# Patient Record
Sex: Female | Born: 1950 | Race: Black or African American | Hispanic: No | Marital: Single | Smoking: Never smoker
Health system: Southern US, Community
[De-identification: ages and names within clinical notes are randomized; demographics above are authoritative.]

## PROBLEM LIST (undated history)

## (undated) DIAGNOSIS — M5136 Other intervertebral disc degeneration, lumbar region: Secondary | ICD-10-CM

## (undated) DIAGNOSIS — IMO0002 Reserved for concepts with insufficient information to code with codable children: Secondary | ICD-10-CM

## (undated) DIAGNOSIS — R0600 Dyspnea, unspecified: Secondary | ICD-10-CM

## (undated) DIAGNOSIS — F419 Anxiety disorder, unspecified: Secondary | ICD-10-CM

## (undated) DIAGNOSIS — F418 Other specified anxiety disorders: Secondary | ICD-10-CM

## (undated) DIAGNOSIS — R079 Chest pain, unspecified: Secondary | ICD-10-CM

## (undated) DIAGNOSIS — E119 Type 2 diabetes mellitus without complications: Secondary | ICD-10-CM

## (undated) DIAGNOSIS — F329 Major depressive disorder, single episode, unspecified: Secondary | ICD-10-CM

## (undated) DIAGNOSIS — J302 Other seasonal allergic rhinitis: Secondary | ICD-10-CM

## (undated) DIAGNOSIS — N281 Cyst of kidney, acquired: Secondary | ICD-10-CM

## (undated) DIAGNOSIS — M1990 Unspecified osteoarthritis, unspecified site: Secondary | ICD-10-CM

## (undated) DIAGNOSIS — R112 Nausea with vomiting, unspecified: Secondary | ICD-10-CM

## (undated) DIAGNOSIS — I10 Essential (primary) hypertension: Secondary | ICD-10-CM

## (undated) DIAGNOSIS — T7840XA Allergy, unspecified, initial encounter: Secondary | ICD-10-CM

## (undated) DIAGNOSIS — F32A Depression, unspecified: Secondary | ICD-10-CM

## (undated) DIAGNOSIS — E785 Hyperlipidemia, unspecified: Secondary | ICD-10-CM

## (undated) DIAGNOSIS — H409 Unspecified glaucoma: Secondary | ICD-10-CM

## (undated) DIAGNOSIS — Z9889 Other specified postprocedural states: Secondary | ICD-10-CM

## (undated) DIAGNOSIS — Z87898 Personal history of other specified conditions: Secondary | ICD-10-CM

## (undated) HISTORY — DX: Other specified postprocedural states: Z98.890

## (undated) HISTORY — PX: OTHER SURGICAL HISTORY: SHX169

## (undated) HISTORY — DX: Anxiety disorder, unspecified: F41.9

## (undated) HISTORY — DX: Unspecified osteoarthritis, unspecified site: M19.90

## (undated) HISTORY — DX: Personal history of other specified conditions: Z87.898

## (undated) HISTORY — PX: GLAUCOMA SURGERY: SHX656

## (undated) HISTORY — DX: Other intervertebral disc degeneration, lumbar region: M51.36

## (undated) HISTORY — DX: Depression, unspecified: F32.A

## (undated) HISTORY — DX: Chest pain, unspecified: R07.9

## (undated) HISTORY — DX: Other specified postprocedural states: R11.2

## (undated) HISTORY — DX: Reserved for concepts with insufficient information to code with codable children: IMO0002

## (undated) HISTORY — PX: TONSILLECTOMY: SUR1361

## (undated) HISTORY — DX: Unspecified glaucoma: H40.9

## (undated) HISTORY — DX: Other specified anxiety disorders: F41.8

## (undated) HISTORY — DX: Hyperlipidemia, unspecified: E78.5

## (undated) HISTORY — DX: Dyspnea, unspecified: R06.00

## (undated) HISTORY — DX: Type 2 diabetes mellitus without complications: E11.9

## (undated) HISTORY — DX: Cyst of kidney, acquired: N28.1

## (undated) HISTORY — PX: HAMMER TOE SURGERY: SHX385

## (undated) HISTORY — PX: CATARACT EXTRACTION W/ INTRAOCULAR LENS IMPLANT: SHX1309

## (undated) HISTORY — DX: Major depressive disorder, single episode, unspecified: F32.9

## (undated) HISTORY — DX: Allergy, unspecified, initial encounter: T78.40XA

---

## 1998-02-23 ENCOUNTER — Ambulatory Visit (HOSPITAL_BASED_OUTPATIENT_CLINIC_OR_DEPARTMENT_OTHER): Admission: RE | Admit: 1998-02-23 | Discharge: 1998-02-23 | Payer: Self-pay | Admitting: Orthopedic Surgery

## 1998-04-01 ENCOUNTER — Emergency Department (HOSPITAL_COMMUNITY): Admission: EM | Admit: 1998-04-01 | Discharge: 1998-04-01 | Payer: Self-pay | Admitting: Emergency Medicine

## 1998-08-17 ENCOUNTER — Encounter: Admission: RE | Admit: 1998-08-17 | Discharge: 1998-08-17 | Payer: Self-pay | Admitting: Obstetrics

## 1998-08-18 ENCOUNTER — Ambulatory Visit (HOSPITAL_COMMUNITY): Admission: RE | Admit: 1998-08-18 | Discharge: 1998-08-18 | Payer: Self-pay | Admitting: Obstetrics

## 1998-08-18 ENCOUNTER — Encounter: Payer: Self-pay | Admitting: Obstetrics

## 1998-08-23 ENCOUNTER — Ambulatory Visit (HOSPITAL_COMMUNITY): Admission: RE | Admit: 1998-08-23 | Discharge: 1998-08-23 | Payer: Self-pay

## 1998-10-19 ENCOUNTER — Encounter: Admission: RE | Admit: 1998-10-19 | Discharge: 1998-10-19 | Payer: Self-pay | Admitting: Obstetrics & Gynecology

## 1998-12-29 ENCOUNTER — Encounter: Admission: RE | Admit: 1998-12-29 | Discharge: 1998-12-29 | Payer: Self-pay | Admitting: Obstetrics

## 1999-05-05 ENCOUNTER — Encounter: Admission: RE | Admit: 1999-05-05 | Discharge: 1999-05-05 | Payer: Self-pay | Admitting: Obstetrics

## 1999-07-22 ENCOUNTER — Emergency Department (HOSPITAL_COMMUNITY): Admission: EM | Admit: 1999-07-22 | Discharge: 1999-07-22 | Payer: Self-pay | Admitting: Emergency Medicine

## 1999-07-23 ENCOUNTER — Encounter: Payer: Self-pay | Admitting: Emergency Medicine

## 1999-09-19 ENCOUNTER — Ambulatory Visit (HOSPITAL_COMMUNITY): Admission: RE | Admit: 1999-09-19 | Discharge: 1999-09-19 | Payer: Self-pay

## 1999-11-29 ENCOUNTER — Encounter: Admission: RE | Admit: 1999-11-29 | Discharge: 1999-11-29 | Payer: Self-pay | Admitting: Obstetrics & Gynecology

## 2000-09-26 ENCOUNTER — Ambulatory Visit (HOSPITAL_COMMUNITY): Admission: RE | Admit: 2000-09-26 | Discharge: 2000-09-26 | Payer: Self-pay | Admitting: Obstetrics

## 2000-10-04 ENCOUNTER — Encounter: Admission: RE | Admit: 2000-10-04 | Discharge: 2000-10-04 | Payer: Self-pay | Admitting: Obstetrics

## 2000-12-20 ENCOUNTER — Encounter: Admission: RE | Admit: 2000-12-20 | Discharge: 2000-12-20 | Payer: Self-pay | Admitting: Obstetrics

## 2001-02-21 ENCOUNTER — Encounter: Payer: Self-pay | Admitting: Emergency Medicine

## 2001-02-21 ENCOUNTER — Emergency Department (HOSPITAL_COMMUNITY): Admission: EM | Admit: 2001-02-21 | Discharge: 2001-02-21 | Payer: Self-pay | Admitting: Emergency Medicine

## 2001-02-23 ENCOUNTER — Emergency Department (HOSPITAL_COMMUNITY): Admission: EM | Admit: 2001-02-23 | Discharge: 2001-02-23 | Payer: Self-pay

## 2001-04-05 ENCOUNTER — Encounter: Admission: RE | Admit: 2001-04-05 | Discharge: 2001-04-05 | Payer: Self-pay | Admitting: Internal Medicine

## 2001-05-30 ENCOUNTER — Ambulatory Visit (HOSPITAL_COMMUNITY): Admission: RE | Admit: 2001-05-30 | Discharge: 2001-05-30 | Payer: Self-pay | Admitting: Diagnostic Radiology

## 2001-06-03 ENCOUNTER — Encounter: Payer: Self-pay | Admitting: Obstetrics and Gynecology

## 2001-06-03 ENCOUNTER — Ambulatory Visit (HOSPITAL_COMMUNITY): Admission: RE | Admit: 2001-06-03 | Discharge: 2001-06-03 | Payer: Self-pay | Admitting: Obstetrics and Gynecology

## 2001-06-17 ENCOUNTER — Observation Stay (HOSPITAL_COMMUNITY): Admission: RE | Admit: 2001-06-17 | Discharge: 2001-06-18 | Payer: Self-pay | Admitting: Diagnostic Radiology

## 2001-10-25 ENCOUNTER — Ambulatory Visit (HOSPITAL_COMMUNITY): Admission: RE | Admit: 2001-10-25 | Discharge: 2001-10-25 | Payer: Self-pay | Admitting: Obstetrics

## 2001-10-25 ENCOUNTER — Encounter: Payer: Self-pay | Admitting: Obstetrics

## 2001-12-31 ENCOUNTER — Emergency Department (HOSPITAL_COMMUNITY): Admission: EM | Admit: 2001-12-31 | Discharge: 2001-12-31 | Payer: Self-pay | Admitting: Emergency Medicine

## 2002-09-04 HISTORY — PX: TOTAL KNEE ARTHROPLASTY: SHX125

## 2002-12-03 ENCOUNTER — Encounter: Payer: Self-pay | Admitting: Orthopedic Surgery

## 2002-12-09 ENCOUNTER — Inpatient Hospital Stay (HOSPITAL_COMMUNITY): Admission: RE | Admit: 2002-12-09 | Discharge: 2002-12-12 | Payer: Self-pay | Admitting: Orthopedic Surgery

## 2003-05-05 ENCOUNTER — Encounter: Payer: Self-pay | Admitting: Obstetrics

## 2003-05-05 ENCOUNTER — Ambulatory Visit (HOSPITAL_COMMUNITY): Admission: RE | Admit: 2003-05-05 | Discharge: 2003-05-05 | Payer: Self-pay | Admitting: Obstetrics

## 2004-02-08 ENCOUNTER — Ambulatory Visit (HOSPITAL_COMMUNITY): Admission: RE | Admit: 2004-02-08 | Discharge: 2004-02-08 | Payer: Self-pay | Admitting: Orthopedic Surgery

## 2004-10-05 ENCOUNTER — Ambulatory Visit: Payer: Self-pay | Admitting: Gastroenterology

## 2004-10-06 ENCOUNTER — Ambulatory Visit (HOSPITAL_COMMUNITY): Admission: RE | Admit: 2004-10-06 | Discharge: 2004-10-06 | Payer: Self-pay | Admitting: Gastroenterology

## 2004-11-04 ENCOUNTER — Ambulatory Visit: Payer: Self-pay | Admitting: Gastroenterology

## 2004-11-08 ENCOUNTER — Ambulatory Visit: Payer: Self-pay | Admitting: Gastroenterology

## 2005-04-09 ENCOUNTER — Emergency Department (HOSPITAL_COMMUNITY): Admission: EM | Admit: 2005-04-09 | Discharge: 2005-04-09 | Payer: Self-pay | Admitting: Emergency Medicine

## 2005-05-26 ENCOUNTER — Encounter: Admission: RE | Admit: 2005-05-26 | Discharge: 2005-05-26 | Payer: Self-pay | Admitting: Obstetrics & Gynecology

## 2005-06-30 ENCOUNTER — Emergency Department (HOSPITAL_COMMUNITY): Admission: EM | Admit: 2005-06-30 | Discharge: 2005-06-30 | Payer: Self-pay | Admitting: Family Medicine

## 2005-09-04 HISTORY — PX: LAPAROSCOPIC SUPRACERVICAL HYSTERECTOMY: SUR797

## 2005-09-04 HISTORY — PX: ABDOMINAL HYSTERECTOMY: SHX81

## 2005-09-14 ENCOUNTER — Encounter (INDEPENDENT_AMBULATORY_CARE_PROVIDER_SITE_OTHER): Payer: Self-pay | Admitting: Specialist

## 2005-09-14 ENCOUNTER — Observation Stay (HOSPITAL_COMMUNITY): Admission: RE | Admit: 2005-09-14 | Discharge: 2005-09-15 | Payer: Self-pay | Admitting: *Deleted

## 2006-03-24 ENCOUNTER — Emergency Department (HOSPITAL_COMMUNITY): Admission: EM | Admit: 2006-03-24 | Discharge: 2006-03-24 | Payer: Self-pay | Admitting: Emergency Medicine

## 2006-09-13 ENCOUNTER — Ambulatory Visit (HOSPITAL_COMMUNITY)
Admission: RE | Admit: 2006-09-13 | Discharge: 2006-09-13 | Payer: Self-pay | Admitting: Physical Medicine and Rehabilitation

## 2008-06-28 ENCOUNTER — Emergency Department (HOSPITAL_COMMUNITY): Admission: EM | Admit: 2008-06-28 | Discharge: 2008-06-28 | Payer: Self-pay | Admitting: Emergency Medicine

## 2008-11-21 ENCOUNTER — Emergency Department (HOSPITAL_COMMUNITY): Admission: EM | Admit: 2008-11-21 | Discharge: 2008-11-21 | Payer: Self-pay | Admitting: Emergency Medicine

## 2008-11-28 ENCOUNTER — Emergency Department (HOSPITAL_COMMUNITY): Admission: EM | Admit: 2008-11-28 | Discharge: 2008-11-28 | Payer: Self-pay | Admitting: Family Medicine

## 2009-02-26 ENCOUNTER — Emergency Department (HOSPITAL_COMMUNITY): Admission: EM | Admit: 2009-02-26 | Discharge: 2009-02-26 | Payer: Self-pay | Admitting: Emergency Medicine

## 2009-05-30 ENCOUNTER — Emergency Department (HOSPITAL_COMMUNITY): Admission: EM | Admit: 2009-05-30 | Discharge: 2009-05-30 | Payer: Self-pay | Admitting: Emergency Medicine

## 2009-09-04 DIAGNOSIS — E119 Type 2 diabetes mellitus without complications: Secondary | ICD-10-CM

## 2009-09-04 HISTORY — DX: Type 2 diabetes mellitus without complications: E11.9

## 2009-10-29 ENCOUNTER — Encounter (INDEPENDENT_AMBULATORY_CARE_PROVIDER_SITE_OTHER): Payer: Self-pay | Admitting: *Deleted

## 2009-12-17 ENCOUNTER — Emergency Department (HOSPITAL_COMMUNITY): Admission: EM | Admit: 2009-12-17 | Discharge: 2009-12-17 | Payer: Self-pay | Admitting: Emergency Medicine

## 2010-03-19 ENCOUNTER — Emergency Department (HOSPITAL_COMMUNITY): Admission: EM | Admit: 2010-03-19 | Discharge: 2010-03-19 | Payer: Self-pay | Admitting: Family Medicine

## 2010-06-24 ENCOUNTER — Emergency Department (HOSPITAL_COMMUNITY): Admission: EM | Admit: 2010-06-24 | Discharge: 2010-06-24 | Payer: Self-pay | Admitting: Family Medicine

## 2010-07-04 ENCOUNTER — Emergency Department (HOSPITAL_COMMUNITY): Admission: EM | Admit: 2010-07-04 | Discharge: 2010-07-04 | Payer: Self-pay | Admitting: Emergency Medicine

## 2010-08-09 ENCOUNTER — Telehealth: Payer: Self-pay | Admitting: Gastroenterology

## 2010-09-25 ENCOUNTER — Encounter: Payer: Self-pay | Admitting: Physical Medicine and Rehabilitation

## 2010-10-04 NOTE — Progress Notes (Signed)
Summary: Schedule Recall Colonoscopy   Phone Note Outgoing Call Call back at Home Phone [phone]   Call placed by: Harlow Mares CMA Duncan Dull),  August 09, 2010 1:33 PM Call placed to: Patient Summary of Call: Patients number is disconnected, we will mail them a letter to remind them they are due for their procedure and they need to call back and schedule.   Initial call taken by: Harlow Mares CMA (AAMA),  August 09, 2010 1:33 PM

## 2010-10-04 NOTE — Letter (Signed)
Summary: Colonoscopy Letter  [facility]  [address] [place], [place] [address]   Phone: [phone]  Fax: [phone]      October 29, 2009 MRN: [id number]   Melissa [name] [address] [place], [place]  [address]   Dear Ms. DESANTIAGO,   According to your medical record, it is time for you to schedule a Colonoscopy. The American Cancer Society recommends this procedure as a method to detect early colon cancer. Patients with a family history of colon cancer, or a personal history of colon polyps or inflammatory bowel disease are at increased risk.  This letter has beeen generated based on the recommendations made at the time of your procedure. If you feel that in your particular situation this may no longer apply, please contact our office.  Please call our office at [phone] to schedule this appointment or to update your records at your earliest convenience.  Thank you for cooperating with [place] to provide you with the very best care possible.   Sincerely,  Judie Petit T. Russella Dar, M.D.  [facility] [phone]

## 2010-12-09 LAB — DIFFERENTIAL
Basophils Absolute: 0.1 10*3/uL (ref 0.0–0.1)
Basophils Relative: 1 % (ref 0–1)
Eosinophils Absolute: 0.2 10*3/uL (ref 0.0–0.7)
Eosinophils Relative: 4 % (ref 0–5)
Lymphocytes Relative: 41 % (ref 12–46)
Lymphs Abs: 2 10*3/uL (ref 0.7–4.0)
Monocytes Absolute: 0.4 10*3/uL (ref 0.1–1.0)
Monocytes Relative: 8 % (ref 3–12)
Neutro Abs: 2.3 10*3/uL (ref 1.7–7.7)
Neutrophils Relative %: 46 % (ref 43–77)

## 2010-12-09 LAB — COMPREHENSIVE METABOLIC PANEL
ALT: 22 U/L (ref 0–35)
AST: 40 U/L — ABNORMAL HIGH (ref 0–37)
Albumin: 3.6 g/dL (ref 3.5–5.2)
Alkaline Phosphatase: 81 U/L (ref 39–117)
BUN: 11 mg/dL (ref 6–23)
CO2: 28 mEq/L (ref 19–32)
Calcium: 9.2 mg/dL (ref 8.4–10.5)
Chloride: 102 mEq/L (ref 96–112)
Creatinine, Ser: 0.68 mg/dL (ref 0.4–1.2)
GFR calc Af Amer: >60 mL/min (ref >60)
GFR calc non Af Amer: >60 mL/min (ref >60)
Glucose, Bld: 113 mg/dL — ABNORMAL HIGH (ref 70–99)
Potassium: 5.1 mEq/L (ref 3.5–5.1)
Sodium: 136 mEq/L (ref 135–145)
Total Bilirubin: 1.1 mg/dL (ref 0.3–1.2)
Total Protein: 7.2 g/dL (ref 6.0–8.3)

## 2010-12-09 LAB — URINE MICROSCOPIC-ADD ON

## 2010-12-09 LAB — URINALYSIS, ROUTINE W REFLEX MICROSCOPIC
Bilirubin Urine: NEGATIVE
Glucose, UA: NEGATIVE mg/dL
Hgb urine dipstick: NEGATIVE
Ketones, ur: NEGATIVE mg/dL
Nitrite: NEGATIVE
Protein, ur: NEGATIVE mg/dL
Specific Gravity, Urine: 1.011 (ref 1.005–1.030)
Urobilinogen, UA: 0.2 mg/dL (ref 0.0–1.0)
pH: 7 (ref 5.0–8.0)

## 2010-12-09 LAB — CBC
HCT: 35.7 % — ABNORMAL LOW (ref 36.0–46.0)
Hemoglobin: 11.8 g/dL — ABNORMAL LOW (ref 12.0–15.0)
MCHC: 32.9 g/dL (ref 30.0–36.0)
MCV: 83.9 fL (ref 78.0–100.0)
Platelets: 241 10*3/uL (ref 150–400)
RBC: 4.26 MIL/uL (ref 3.87–5.11)
RDW: 15.2 % (ref 11.5–15.5)
WBC: 5 10*3/uL (ref 4.0–10.5)

## 2010-12-09 LAB — POCT CARDIAC MARKERS
CKMB, poc: 1.9 ng/mL (ref 1.0–8.0)
Myoglobin, poc: 81.9 ng/mL (ref 12–200)
Troponin i, poc: <0.05 ng/mL (ref 0.00–0.09)

## 2011-01-20 NOTE — Discharge Summary (Signed)
NAME:  Melissa [name], Melissa [name]                       ACCOUNT NO.:  [id number]   MEDICAL RECORD NO.:  [id number]                   PATIENT TYPE:  INP   LOCATION:  5023                                 FACILITY:  [facility]   PHYSICIAN:  Deidre Ala, M.D.                 DATE OF BIRTH:  1950-09-23   DATE OF ADMISSION:  DATE OF DISCHARGE:  12/12/2002                                 DISCHARGE SUMMARY   ADMISSION DIAGNOSIS:  End stage degenerative joint disease, right knee.   DISCHARGE DIAGNOSIS:  End stage degenerative joint disease, right knee,  status post right total knee arthroplasty.   HOSPITAL COURSE:  The patient was taken to the operating room at [facility] on December 09, 2002 for a right total knee arthroplasty.  This was  done under general anesthesia with tourniquet time of one hour and two  minutes and estimated blood less than .  The wound was closed over two  medium Hemovac drains hooked up to an Autovac.  She was taken to the  recovery room in stable condition.  On postoperative day 1 December 10, 2002 she  was having a moderate amount of pain with motion but no pain at rest.  She  said the pain started at night.  She was having minimal PCA use.  The pain  was well controlled.  She had little or no appetite in the morning, a mild  bit of nausea utilized she was drinking juice.  Her vitals were stable.  She  was afebrile, the dressing was clean and dry.  Calf was soft and nontender.  She was neurovascular intact.  Her hematocrit and hemoglobin were 34.2 and  11.5.  PT was 15.8 with an INR of 1.0, serum chemistries were normal except  for a potassium low at 3.2, a BUN low at 4, glucose elevated at 131.  It was  ordered to give her three runs of potassium chloride 10 mEq in of  saline.  Potassium chloride 20 mEq 1 p.o. daily.  She was made out of bed to  chair, beginning to ambulate, weightbearing as tolerated, continuing Lovenox  and Coumadin per pharmacy protocol.   Physical therapy, occupational therapy  and rehabilitation consults were obtained.  On postoperative day 2, December 11, 2002, she was having minimal pain that was well controlled.  A little bit of  nausea secondary to the pain medications.  It was well controlled with  Reglan,  She was eating, had a good appetite, vitals were stable.  She was  afebrile.  The wound was benign, minimal amount of drainage, none active.  A  dressing was changed, the drain was discontinued.  Her PT was 16.9, with an  INR of 1.4.  Blood count showed a white count of 9400, hemoglobin 11.7,  hematocrit 35.7 and platelet count 260,000 serum chemistries normal except  for a sodium at 134, potassium  still low at 3.5.  BUN low at 3 and glucose  elevated at 137.  PCA was discontinued.  The IV was dropped down to a saline  well.  She was given Percocet two pills every four to six hours for pain,  morphine 2 to 4 mg IV every three to four hours for breakthrough, letting  her ambulate, encouraging her to move around and we are waiting for results  of her rehabilitation consults.  On the ninth of April 2004 she was without  complaints, afebrile with stable vitals.  The knee was benign.  Dressing  clean and dry and rehabilitation and therapy were both recommended that she  be discharged to home, so after therapy visit on April 9, she was discharged  to home.   DISCHARGE INSTRUCTIONS:  Weightbearing as tolerated on her right knee using  a walker for ambulation.  Diet was without restrictions.  Wound care was to  keep it clean and dry.  Staples out in two weeks.  Special instructions were  call for pain not controlled by medication, increased swelling or  temperature greater than 101.   DISCHARGE MEDICATIONS:  1. Percocet one to two p.o. every three to four hours for pain.  2. Vicodin one to two p.o. every four to six hours for less severe pain.  3. Coumadin 5 mg one daily until the dose was adjusted per home health.     FOLLOW UP:  Dr. Renae Fickle in 10 days.   CONDITION ON DISCHARGE:  Stable.     Madilyn Fireman, P.A.-C.                       Deidre Ala, M.D.    AC/MEDQ  D:  01/11/2003  T:  01/13/2003  Job:  [id number]

## 2011-01-20 NOTE — Op Note (Signed)
NAME:  Melissa [name], Melissa [name]                       ACCOUNT NO.:  [id number]   MEDICAL RECORD NO.:  [id number]                   PATIENT TYPE:  INP   LOCATION:  2550                                 FACILITY:  [facility]   PHYSICIAN:  Deidre Ala, M.D.                 DATE OF BIRTH:  1951/08/01   DATE OF PROCEDURE:  12/09/2002  DATE OF DISCHARGE:                                 OPERATIVE REPORT   PREOPERATIVE DIAGNOSIS:  End stage degenerative joint disease, right knee.   POSTOPERATIVE DIAGNOSIS:  End stage degenerative joint disease, right knee.   OPERATION PERFORMED:  Right total knee arthroplasty using cemented Depuy  components, LCS type, rotating platform.   SURGEON:  Bradley Ferris, M.D.   ASSISTANT:  1. Harvie Junior, M.D.  2. Madilyn Fireman, P.A.-C.   ANESTHESIA:  General with LMA with preoperative femoral nerve block.   CULTURES:  None.   DRAINS:  Two medium Hemovacs to Autovac.   ESTIMATED BLOOD LOSS:  Less than 100 ml.   BLOOD REPLACED:  Without.   PATHOLOGIC FINDINGS AND HISTORY:  The patient is a 60 year old female who  first presented to me with knee pain in 1996.  She had chondromalacia  patella.  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  She then has had on and off pain since with  progressive discomfort occurring more recently.  Cortisone injections had  been given and she basically had end stage patellofemoral disease and some  back problems that were going on.  She was also then found on October 31, 2001 to have near bone-on-bone in the right medial compartment, so it was  felt that she would not do well with patellofemoral placement alone.  She  was having pain with every step, back pain, could not walk more than a city  block and so elected to proceed with surgery.  At surgery she had  tricompartmental disease.  We basically fitted her to a  standard right  femur, a 10 mm insert, a 3 tibial tray keeled, cemented, overdome patella 3  peg 35 mm.  We got excellent ligament stability with full extension and  flexion to 110 degrees, stable to stressing and we used gentamicin infused  Depuy bone cement.   DESCRIPTION OF PROCEDURE:  With adequate anesthesia obtained using LMA  technique, 1 gm Ancef given IV prophylaxis and another one at tourniquet let  down, the patient was placed in supine position.  The right lower extremity  was prepped from the toes to the tourniquet in standard fashion.  After  standard prepping and draping, Esmarch exsanguination was used and the  tourniquet was let up to 350 mmHg.  A median parapatellar skin incision was  then made incorporating old medial incision where it had been lacerated  apparently in the past.  Incision deepened sharply with the knife and  hemostasis obtained using the Bovie electrocoagulator.  We then made a  median parapatellar retinacular incision.  Incision deepened sharply with a  knife and hemostasis obtained again by the Bovie electrocoagulator.  The  patella was then everted and fat pad excised.  We then removed both meniscal  remnants and the cruciates and then amputated with a saw, the tibial spines.  Tibial cutting jig was then put in place slightly medial to the tibial crest  distally and our cut made.  We then sized to a standard anterior posterior  cutting jig and made those cuts and fit the 10 mm lollipop in flexion.  We  then placed a 4 degree valgus cutting jig in place and made distal femoral  cuts.  We then fit the 10.  We set it back 2.5 mm less than it would have  been and then it had full extension with a 10 mm lollipop.  We then placed  anterior posterior Chamfer cutting and the notch cutting jig in place and  made those cuts as well as a far posterior femoral condyle cut.  We then  exposed the proximal tibia, sized to a 3.  Placed the template on, made our   central peg hole and broached for the keel.  We then placed a trial 10 mm  spacer in place, placed a trial femur on standard and took it through a full  range of motion.  We then measured the patella thickness to a 21, cut down  to a 14 with the guide, placed a three-peg guide in place, made those cuts  and then trialed a patella with good tracking without need for release.  All  trial components were then removed and jet lavage was carried out along the  joint.  Components were checked on the back table and they were brought on  to check for size.  We then mixed cement and cemented on the tibial  component, impacted it and removed excess cement.  We then put in the  rotating platform, cemented on the femoral component, impacted it, removed  excess cement.  We then cemented on the patellar component, impacted it and  removed excess cement and held it with a clamp until the cement had cured.  The tourniquet was let down, bleeding points were cauterized.  We then  placed Hemovac drains in the medial and lateral gutters and brought out the  superior lateral portal.  The wound was then closed in layers.  Hemovacs  were placed in the medial and lateral gutters at the joint line.  We then  closed the wound in layers with #1 Vicryl on the retinaculum, 0 and 2-0  Vicryl running on the subcu and skin staples.  A bulky sterile compressive  dressing was applied with Ace and knee immobilizer.  The patient having  tolerated the procedure well, was awakened and taken to the recovery room in  satisfactory condition for CPM and routine postoperative care.                                                Deidre Ala, M.D.    VEP/MEDQ  D:  12/09/2002  T:  12/09/2002  Job:  [id number]

## 2011-01-20 NOTE — Op Note (Signed)
NAME:  DESSA, LEDEE             ACCOUNT NO.:  [id number]   MEDICAL RECORD NO.:  [id number]          PATIENT TYPE:  OBV   LOCATION:  9399                          FACILITY:  [facility]   PHYSICIAN:  Genia Del, M.D.DATE OF BIRTH:  1950-09-18   DATE OF PROCEDURE:  09/14/2005  DATE OF DISCHARGE:                                 OPERATIVE REPORT   PREOPERATIVE DIAGNOSIS:  Menometrorrhagia with uterine myomas.   POSTOPERATIVE DIAGNOSIS:  Menometrorrhagia with uterine myomas.   INTERVENTION:  Laparoscopy-assisted supracervical hysterectomy with  bilateral salpingo-oophorectomy.   SURGEON:  Genia Del, M.D.   ASSISTANT:  Gerri Spore B. Earlene Plater, M.D.   ANESTHESIOLOGIST:  Quillian Quince, M.D.   PROCEDURE:  Under general anesthesia, the patient is in lithotomy position  for operative laparoscopy.  She is prepped with Betadine on the abdominal,  suprapubic, vulvar and vaginal areas and draped as usual.  The bladder  catheter is inserted.  We infiltrate the subcutaneous tissue with Marcaine  0.25% plain infraumbilically.  We make an incision with a scalpel at that  level over 2 cm.  We then open the aponeurosis with the Mayo scissors under  direct vision and then open the parietal peritoneum bluntly with a finger.  A pursestring suture of Vicryl 0 is put at the aponeurosis and we insert the  Hasson with the laparoscope at that level.  A pneumoperitoneum with CO2 is  created.  We make two contralateral incisions, one on the left iliac area  after infiltrating Marcaine 0.25% plain at the site.  We make a 1 cm  incision with a scalpel on the left side, insert the 11 mm trocar at that  level.  On the right side an infiltration of Marcaine 0.25% plain is done.  We then make a less than 5 mm incision with a scalpel and insert a 5 mm  trocar at that level.  A clamp and the Harmonic scalpel are put in place.  The abdominal cavity was inspected first.  Pictures were taken of the liver  and the  appendix, no lesions seen.  In the pelvis both ovaries were normal  to inspection.  Both tubes were normal to inspection.  The uterus was  enlarged to about 10 cm with multiple irregular uterine myomas.  The largest  was about 3 cm.  The myomas appeared intramural.  No other pathology was  seen in the pelvis.  We start on the left side after visualizing the left  ureter, which is in normal anatomic position. The left infundibulopelvic  ligament is sectioned with a Harmonic scalpel.  We follow then toward the  uterus and section the left round ligament and reach down to the left  uterine artery, which is bisected, well-visualized, and then sectioned with  a Harmonic scalpel.  The visceral peritoneum is opened anteriorly with a  Harmonic scalpel and the bladder is reclined downward.  We proceed exactly  the same way on the right side, exchanging the instrument.  We then see the  uterus blanching, confirming that the blood flow has been well-controlled.  We then resect the uterus from the cervix  in a wet fashion on each side  using the drilling method with the Harmonic scalpel.  The uterus is  separated from the cervical stump completely.  We then go with the active  blade of the Harmonic scalpel for 20 seconds and the internal os to lower  the risk of eventual metrorrhagia.  We have a good hemostasis at all levels.  We exchange instrument from the Harmonic scalpel to the morcellator.  The  uterus and ovaries are morcellated and removed from the intra-abdominal  pelvic cavity and sent to pathology.  We irrigate and suction the abdominal-  pelvic cavity.  Hemostasis is completed on the right aspect of the cervix  superficially with the bipolar.  We have taken pictures of before the  hysterectomy.  We then insert a Surgicel to reduce the risk of adhesions  with the cervix.  We then remove all instruments under direct vision.  The  CO2 is evacuated.  The Vicryl 0 is attached at the aponeurosis   infraumbilically.  We then close the skin at all incisions with a  subcuticular stitch of Vicryl 4-0.  Dermabond is also applied at all  incisions.  Hemostasis is adequate at that level.  No complication occurred.  The patient was brought to recovery room in good, stable status.      Genia Del, M.D.  Electronically Signed     ML/MEDQ  D:  09/14/2005  T:  09/14/2005  Job:  [id number]

## 2011-04-17 ENCOUNTER — Emergency Department (HOSPITAL_COMMUNITY)
Admission: EM | Admit: 2011-04-17 | Discharge: 2011-04-17 | Disposition: A | Discharge status: Home / Self Care | Payer: Self-pay | Attending: Emergency Medicine | Admitting: Emergency Medicine

## 2011-04-17 DIAGNOSIS — I10 Essential (primary) hypertension: Secondary | ICD-10-CM | POA: Insufficient documentation

## 2011-04-17 DIAGNOSIS — Z833 Family history of diabetes mellitus: Secondary | ICD-10-CM | POA: Insufficient documentation

## 2011-04-17 DIAGNOSIS — Z049 Encounter for examination and observation for unspecified reason: Secondary | ICD-10-CM | POA: Insufficient documentation

## 2011-04-17 LAB — CBC
HCT: 37.6 % (ref 36.0–46.0)
Hemoglobin: 11.8 g/dL — ABNORMAL LOW (ref 12.0–15.0)
MCH: 26.5 pg (ref 26.0–34.0)
MCHC: 31.4 g/dL (ref 30.0–36.0)
MCV: 84.5 fL (ref 78.0–100.0)
Platelets: 295 10*3/uL (ref 150–400)
RBC: 4.45 MIL/uL (ref 3.87–5.11)
RDW: 15.8 % — ABNORMAL HIGH (ref 11.5–15.5)
WBC: 5.2 10*3/uL (ref 4.0–10.5)

## 2011-04-17 LAB — URINALYSIS, ROUTINE W REFLEX MICROSCOPIC
Bilirubin Urine: NEGATIVE
Glucose, UA: NEGATIVE mg/dL
Hgb urine dipstick: NEGATIVE
Ketones, ur: NEGATIVE mg/dL
Nitrite: NEGATIVE
Protein, ur: NEGATIVE mg/dL
Specific Gravity, Urine: 1.022 (ref 1.005–1.030)
Urobilinogen, UA: 0.2 mg/dL (ref 0.0–1.0)
pH: 6 (ref 5.0–8.0)

## 2011-04-17 LAB — BASIC METABOLIC PANEL
BUN: 14 mg/dL (ref 6–23)
CO2: 29 mEq/L (ref 19–32)
Calcium: 9.9 mg/dL (ref 8.4–10.5)
Chloride: 103 mEq/L (ref 96–112)
Creatinine, Ser: 0.72 mg/dL (ref 0.50–1.10)
GFR calc Af Amer: >60 mL/min (ref >60)
GFR calc non Af Amer: >60 mL/min (ref >60)
Glucose, Bld: 129 mg/dL — ABNORMAL HIGH (ref 70–99)
Potassium: 3.3 mEq/L — ABNORMAL LOW (ref 3.5–5.1)
Sodium: 142 mEq/L (ref 135–145)

## 2011-04-17 LAB — HEMOGLOBIN A1C
Hgb A1c MFr Bld: 7 % — ABNORMAL HIGH (ref <5.7)
Mean Plasma Glucose: 154 mg/dL — ABNORMAL HIGH (ref <117)

## 2011-04-17 LAB — DIFFERENTIAL
Basophils Absolute: 0 10*3/uL (ref 0.0–0.1)
Basophils Relative: 1 % (ref 0–1)
Eosinophils Absolute: 0.3 10*3/uL (ref 0.0–0.7)
Eosinophils Relative: 6 % — ABNORMAL HIGH (ref 0–5)
Lymphocytes Relative: 43 % (ref 12–46)
Lymphs Abs: 2.2 10*3/uL (ref 0.7–4.0)
Monocytes Absolute: 0.2 10*3/uL (ref 0.1–1.0)
Monocytes Relative: 4 % (ref 3–12)
Neutro Abs: 2.4 10*3/uL (ref 1.7–7.7)
Neutrophils Relative %: 47 % (ref 43–77)

## 2011-04-17 LAB — POCT I-STAT TROPONIN I: Troponin i, poc: 0 ng/mL (ref 0.00–0.08)

## 2011-04-17 LAB — GLUCOSE, CAPILLARY: Glucose-Capillary: 143 mg/dL — ABNORMAL HIGH (ref 70–99)

## 2011-04-17 LAB — URINE MICROSCOPIC-ADD ON

## 2011-04-18 LAB — URINE CULTURE
Colony Count: 40000
Culture  Setup Time: 201208140053

## 2011-05-19 ENCOUNTER — Emergency Department (HOSPITAL_COMMUNITY)
Admission: EM | Admit: 2011-05-19 | Discharge: 2011-05-19 | Disposition: A | Discharge status: Home / Self Care | Payer: Self-pay | Attending: Emergency Medicine | Admitting: Emergency Medicine

## 2011-05-19 DIAGNOSIS — E7800 Pure hypercholesterolemia, unspecified: Secondary | ICD-10-CM | POA: Insufficient documentation

## 2011-05-19 DIAGNOSIS — R51 Headache: Secondary | ICD-10-CM | POA: Insufficient documentation

## 2011-05-19 DIAGNOSIS — I10 Essential (primary) hypertension: Secondary | ICD-10-CM | POA: Insufficient documentation

## 2011-05-19 DIAGNOSIS — Z96659 Presence of unspecified artificial knee joint: Secondary | ICD-10-CM | POA: Insufficient documentation

## 2011-05-19 DIAGNOSIS — E119 Type 2 diabetes mellitus without complications: Secondary | ICD-10-CM | POA: Insufficient documentation

## 2011-08-21 ENCOUNTER — Other Ambulatory Visit: Payer: Self-pay | Admitting: Obstetrics and Gynecology

## 2011-08-21 DIAGNOSIS — Z1231 Encounter for screening mammogram for malignant neoplasm of breast: Secondary | ICD-10-CM

## 2011-08-22 ENCOUNTER — Ambulatory Visit: Payer: Self-pay

## 2011-08-22 ENCOUNTER — Ambulatory Visit (HOSPITAL_COMMUNITY): Payer: PRIVATE HEALTH INSURANCE | Attending: Obstetrics and Gynecology

## 2011-10-20 ENCOUNTER — Emergency Department (HOSPITAL_COMMUNITY)
Admission: EM | Admit: 2011-10-20 | Discharge: 2011-10-20 | Disposition: A | Discharge status: Home / Self Care | Payer: Self-pay | Attending: Emergency Medicine | Admitting: Emergency Medicine

## 2011-10-20 ENCOUNTER — Encounter (HOSPITAL_COMMUNITY): Payer: Self-pay | Admitting: Emergency Medicine

## 2011-10-20 DIAGNOSIS — Z79899 Other long term (current) drug therapy: Secondary | ICD-10-CM | POA: Insufficient documentation

## 2011-10-20 DIAGNOSIS — E7800 Pure hypercholesterolemia, unspecified: Secondary | ICD-10-CM | POA: Insufficient documentation

## 2011-10-20 DIAGNOSIS — R070 Pain in throat: Secondary | ICD-10-CM | POA: Insufficient documentation

## 2011-10-20 DIAGNOSIS — R3915 Urgency of urination: Secondary | ICD-10-CM | POA: Insufficient documentation

## 2011-10-20 DIAGNOSIS — R05 Cough: Secondary | ICD-10-CM | POA: Insufficient documentation

## 2011-10-20 DIAGNOSIS — R350 Frequency of micturition: Secondary | ICD-10-CM | POA: Insufficient documentation

## 2011-10-20 DIAGNOSIS — J029 Acute pharyngitis, unspecified: Secondary | ICD-10-CM

## 2011-10-20 DIAGNOSIS — I10 Essential (primary) hypertension: Secondary | ICD-10-CM | POA: Insufficient documentation

## 2011-10-20 DIAGNOSIS — R059 Cough, unspecified: Secondary | ICD-10-CM | POA: Insufficient documentation

## 2011-10-20 DIAGNOSIS — N390 Urinary tract infection, site not specified: Secondary | ICD-10-CM | POA: Insufficient documentation

## 2011-10-20 HISTORY — DX: Essential (primary) hypertension: I10

## 2011-10-20 LAB — URINALYSIS, ROUTINE W REFLEX MICROSCOPIC
Bilirubin Urine: NEGATIVE
Glucose, UA: NEGATIVE mg/dL
Ketones, ur: NEGATIVE mg/dL
Nitrite: NEGATIVE
Protein, ur: NEGATIVE mg/dL
Specific Gravity, Urine: 1.024 (ref 1.005–1.030)
Urobilinogen, UA: 0.2 mg/dL (ref 0.0–1.0)
pH: 5.5 (ref 5.0–8.0)

## 2011-10-20 LAB — URINE MICROSCOPIC-ADD ON

## 2011-10-20 MED ORDER — CEPHALEXIN 500 MG PO CAPS
500.0000 mg | ORAL_CAPSULE | Freq: Once | ORAL | Status: AC
Start: 2011-10-20 — End: 2011-10-20
  Administered 2011-10-20: 500 mg via ORAL
  Filled 2011-10-20: qty 1

## 2011-10-20 MED ORDER — CEPHALEXIN 500 MG PO CAPS: 500.0000 mg | ORAL_CAPSULE | Freq: Four times a day (QID) | ORAL | Status: AC

## 2011-10-20 NOTE — ED Notes (Signed)
Pt is alert and oriented x 4 with respirations even and unlabored.  NAD at this time.  D/C instructions and Rx x 1 reviewed with pt and pt verbalized understanding.  Pt ambulated to lobby with steady gait.

## 2011-10-20 NOTE — Discharge Instructions (Signed)
You have been diagnosed with having a urinary tract infection. Please take your antibiotic for the full duration. If you have sore throat persists for more than 7-10 days, please discuss with your primary care Dr. of the possibility that you may have been allergic to lisinopril. Gargle with salt water.  Salt Water Gargle This solution will help make your mouth and throat feel better. HOME CARE INSTRUCTIONS   Mix 1 teaspoon of salt in 8 ounces of warm water.   Gargle with this solution as much or often as you need or as directed. Swish and gargle gently if you have any sores or wounds in your mouth.   Do not swallow this mixture.  Document Released: 05/25/2004 Document Revised: 05/03/2011 Document Reviewed: 10/16/2008 [facility] Patient Information 2012 [place], [place].Urinary Tract Infection Infections of the urinary tract can start in several places. A bladder infection (cystitis), a kidney infection (pyelonephritis), and a prostate infection (prostatitis) are different types of urinary tract infections (UTIs). They usually get better if treated with medicines (antibiotics) that kill germs. Take all the medicine until it is gone. You or your child may feel better in a few days, but TAKE ALL MEDICINE or the infection may not respond and may become more difficult to treat. HOME CARE INSTRUCTIONS   Drink enough water and fluids to keep the urine clear or pale yellow. Cranberry juice is especially recommended, in addition to large amounts of water.   Avoid caffeine, tea, and carbonated beverages. They tend to irritate the bladder.   Alcohol may irritate the prostate.   Only take over-the-counter or prescription medicines for pain, discomfort, or fever as directed by your caregiver.  To prevent further infections:  Empty the bladder often. Avoid holding urine for long periods of time.   After a bowel movement, women should cleanse from front to back. Use each tissue only once.   Empty the  bladder before and after sexual intercourse.  FINDING OUT THE RESULTS OF YOUR TEST Not all test results are available during your visit. If your or your child's test results are not back during the visit, make an appointment with your caregiver to find out the results. Do not assume everything is normal if you have not heard from your caregiver or the medical facility. It is important for you to follow up on all test results. SEEK MEDICAL CARE IF:   There is back pain.   Your baby is older than 3 months with a rectal temperature of 100.5 F (38.1 C) or higher for more than 1 day.   Your or your child's problems (symptoms) are no better in 3 days. Return sooner if you or your child is getting worse.  SEEK IMMEDIATE MEDICAL CARE IF:   There is severe back pain or lower abdominal pain.   You or your child develops chills.   You have a fever.   Your baby is older than 3 months with a rectal temperature of 102 F (38.9 C) or higher.   Your baby is 28 months old or younger with a rectal temperature of 100.4 F (38 C) or higher.   There is nausea or vomiting.   There is continued burning or discomfort with urination.  MAKE SURE YOU:   Understand these instructions.   Will watch your condition.   Will get help right away if you are not doing well or get worse.  Document Released: 05/31/2005 Document Revised: 05/03/2011 Document Reviewed: 01/03/2007 [facility] Patient Information 2012 [place], [place].

## 2011-10-20 NOTE — ED Provider Notes (Signed)
History     CSN: [id number]  Arrival date & time 10/20/11  1636   First MD Initiated Contact with Patient 10/20/11 1745      Chief Complaint  Patient presents with  . Sore Throat  . Urinary Tract Infection    (Consider location/radiation/quality/duration/timing/severity/associated sxs/prior treatment) HPI  61 year old female presenting to the ED with chief complaints of dysuria. Patient states for the past week she has been having burning on urination with urinary frequency and urgency. She denies abdominal pain or back pain. She denies vaginal discharge, vaginal bleeding. She states her last urinary tract infection was several years ago. Patient also complaining of sore throat. Her throat feels more sore at night time. She has nonproductive dry cough with it. She denies headache, sneezing, ear pain, neck pain, chest pain, or shortness of breath. She admits to taking lisinopril but has been doing so for many years. She has been trying some Tylenol but has not helped the symptoms. She denies any medication changes.  Past Medical History  Diagnosis Date  . Hypercholesterolemia   . Hypertension   . Glaucoma (increased eye pressure)     Past Surgical History  Procedure Date  . Total knee arthroplasty   . Abdominal hysterectomy     No family history on file.  History  Substance Use Topics  . Smoking status: Never Smoker   . Smokeless tobacco: Not on file  . Alcohol Use: No    OB History    Grav Para Term Preterm Abortions TAB SAB Ect Mult Living                  Review of Systems  All other systems reviewed and are negative.    Allergies  Review of patient's allergies indicates no known allergies.  Home Medications   Current Outpatient Rx  Name Route Sig Dispense Refill  . ACETAMINOPHEN 500 MG PO TABS Oral Take 500 mg by mouth every 6 (six) hours as needed. For pain relief    . CALCIUM CARBONATE 600 MG PO TABS Oral Take 600 mg by mouth 2 (two) times daily with  a meal.    . LATANOPROST 0.005 % OP SOLN Both Eyes Place 1 drop into both eyes at bedtime.    [name] LISINOPRIL 10 MG PO TABS Oral Take 10 mg by mouth daily.    . ADULT MULTIVITAMIN W/MINERALS CH Oral Take 1 tablet by mouth daily.    [name] ROSUVASTATIN CALCIUM 20 MG PO TABS Oral Take 10 mg by mouth daily.      BP 124/82  Pulse 89  Temp(Src) 98.7 F (37.1 C) (Oral)  Resp 20  Ht 5\' 6"  (1.676 m)  Wt 215 lb (97.523 kg)  BMI 34.70 kg/m2  SpO2 97%  Physical Exam  Nursing note and vitals reviewed. Constitutional: She is oriented to person, place, and time. She appears well-developed and well-nourished. No distress.  HENT:  Head: Normocephalic and atraumatic.  Right Ear: External ear normal.  Left Ear: External ear normal.  Mouth/Throat: Uvula is midline, oropharynx is clear and moist and mucous membranes are normal. No oropharyngeal exudate, posterior oropharyngeal edema, posterior oropharyngeal erythema or tonsillar abscesses.  Eyes: Conjunctivae are normal.  Neck: Normal range of motion. Neck supple. No JVD present. No tracheal deviation present. No thyromegaly present.  Cardiovascular: Normal rate and regular rhythm.  Exam reveals no gallop and no friction rub.   No murmur heard. Pulmonary/Chest: Effort normal. No stridor. No respiratory distress. She has no wheezes.  Abdominal: Soft. Bowel sounds are normal. There is no tenderness. There is no rebound and no guarding.  Musculoskeletal: Normal range of motion.  Lymphadenopathy:    She has no cervical adenopathy.  Neurological: She is alert and oriented to person, place, and time.  Skin: Skin is warm.    ED Course  Procedures (including critical care time)   Labs Reviewed  URINALYSIS, ROUTINE W REFLEX MICROSCOPIC   No results found.   No diagnosis found.  Results for orders placed during the hospital encounter of 10/20/11  URINALYSIS, ROUTINE W REFLEX MICROSCOPIC      Component Value Range   Color, Urine YELLOW  YELLOW     APPearance CLOUDY (*) CLEAR    Specific Gravity, Urine 1.024  1.005 - 1.030    pH 5.5  5.0 - 8.0    Glucose, UA NEGATIVE  NEGATIVE (mg/dL)   Hgb urine dipstick LARGE (*) NEGATIVE    Bilirubin Urine NEGATIVE  NEGATIVE    Ketones, ur NEGATIVE  NEGATIVE (mg/dL)   Protein, ur NEGATIVE  NEGATIVE (mg/dL)   Urobilinogen, UA 0.2  0.0 - 1.0 (mg/dL)   Nitrite NEGATIVE  NEGATIVE    Leukocytes, UA LARGE (*) NEGATIVE   URINE MICROSCOPIC-ADD ON      Component Value Range   Squamous Epithelial / LPF MANY (*) RARE    WBC, UA TOO NUMEROUS TO COUNT  <3 (WBC/hpf)   RBC / HPF 11-20  <3 (RBC/hpf)   Bacteria, UA FEW (*) RARE    No results found.    MDM  Urine shows evidence of urinary tract infection. Urine culture sent. Patient may have either a viral pharyngitis, or an allergic reaction to lisinopril. However, I recommend for patient to gargle with salt water, and taking antibiotic for urinary tract infection. If the dry cough and sore throat persists more than 10 days she is to followup with her primary care doctor to discuss about possibility of ACE inhibitor reaction. Patient voiced understanding and agree with plan. Patient is currently afebrile with no lower back pain or CVA tenderness concerning for pyelonephritis.        Fayrene Helper, PA-C 10/20/11 Flossie Buffy

## 2011-10-20 NOTE — ED Notes (Signed)
Pt states she began having sore throat and burning with urination on Wednesday.  Pt states she had a dry cough.  Pt states she has been taking Tylenol but it does not help at night.  Pt states urinary frequency and burning that feels like "passing gravel" when she urinates.

## 2011-10-21 NOTE — ED Provider Notes (Signed)
Medical screening examination/treatment/procedure(s) were performed by non-physician practitioner and as supervising physician I was immediately available for consultation/collaboration.  Doug Sou, MD 10/21/11 [phone]

## 2011-10-23 LAB — URINE CULTURE
Colony Count: >=100000
Culture  Setup Time: 201302160240

## 2011-10-24 ENCOUNTER — Encounter (HOSPITAL_COMMUNITY): Payer: Self-pay

## 2011-10-24 ENCOUNTER — Ambulatory Visit (HOSPITAL_COMMUNITY)
Admission: RE | Admit: 2011-10-24 | Discharge: 2011-10-24 | Disposition: A | Discharge status: Home / Self Care | Payer: PRIVATE HEALTH INSURANCE | Source: Ambulatory Visit | Attending: Obstetrics and Gynecology | Admitting: Obstetrics and Gynecology

## 2011-10-24 ENCOUNTER — Ambulatory Visit (INDEPENDENT_AMBULATORY_CARE_PROVIDER_SITE_OTHER): Payer: Self-pay | Admitting: *Deleted

## 2011-10-24 ENCOUNTER — Encounter: Payer: Self-pay | Admitting: *Deleted

## 2011-10-24 VITALS — BP 131/97 | HR 81 | Temp 99.3°F | Ht 66.0 in | Wt 213.9 lb

## 2011-10-24 DIAGNOSIS — Z01419 Encounter for gynecological examination (general) (routine) without abnormal findings: Secondary | ICD-10-CM

## 2011-10-24 DIAGNOSIS — Z1231 Encounter for screening mammogram for malignant neoplasm of breast: Secondary | ICD-10-CM

## 2011-10-24 NOTE — Progress Notes (Signed)
Complaints of tenderness in left lower breast.  Pap Smear:    Completed Pap smear today. Last Pap smear was as least 2 years ago per patient but patient unsure. Per patient last Pap smear was normal and has no history of abnormal Pap smears. Patient has a history of a complete hysterectomy related to fibroids. Told patient she will not need any further Pap smears if this Pap smear is normal. No Pap smear results in EPIC.  Physical exam: Breasts Breasts symmetrical. No skin abnormalities bilateral breasts. No nipple retraction bilateral breasts. No nipple discharge bilateral breasts. No lymphadenopathy. No lumps palpated bilateral breasts. Complaints of tenderness on palpation of left lower breast. Patient states has had tenderness in the left breast x 2 years.          Pelvic/Bimanual   Ext Genitalia No lesions, no swelling and no discharge observed on external genitalia.         Vagina Vagina pink and normal texture. No lesions or discharge observed in vagina.          Cervix Cervix is absent related to history of complete hysterectomy.     Uterus Uterus is absent related to history of complete hysterectomy.     Adnexae Bilateral ovaries are absent related to history of complete hysterectomy.      Rectovaginal No rectal exam completed today since patient had no rectal complaints. No skin abnormalities observed on rectal area.

## 2011-10-24 NOTE — Patient Instructions (Signed)
Taught patient how to perform BSE and gave educational materials to take home. Patient has a history of complete hysterectomy related to fibroids. Told patient if this Pap smear is normal she will not need any further Pap smears per BCCCP guidelines. Escorted patient to mammography for a screening mammogram. Let patient know will follow up with her within the next couple weeks with results by letter or phone. Patient verbalized understanding.

## 2011-10-24 NOTE — ED Notes (Signed)
+   Urine] Patient treated with Macrobid-sensitive to same-chart appended per protocol MD. 

## 2011-10-27 ENCOUNTER — Encounter: Payer: Self-pay | Admitting: Obstetrics and Gynecology

## 2011-12-04 ENCOUNTER — Emergency Department (HOSPITAL_COMMUNITY)
Admission: EM | Admit: 2011-12-04 | Discharge: 2011-12-04 | Disposition: A | Discharge status: Home / Self Care | Payer: Self-pay | Attending: Emergency Medicine | Admitting: Emergency Medicine

## 2011-12-04 ENCOUNTER — Encounter: Payer: Self-pay | Admitting: Gastroenterology

## 2011-12-04 ENCOUNTER — Encounter (HOSPITAL_COMMUNITY): Payer: Self-pay | Admitting: *Deleted

## 2011-12-04 DIAGNOSIS — F3289 Other specified depressive episodes: Secondary | ICD-10-CM | POA: Insufficient documentation

## 2011-12-04 DIAGNOSIS — H409 Unspecified glaucoma: Secondary | ICD-10-CM | POA: Insufficient documentation

## 2011-12-04 DIAGNOSIS — Z886 Allergy status to analgesic agent status: Secondary | ICD-10-CM | POA: Insufficient documentation

## 2011-12-04 DIAGNOSIS — I10 Essential (primary) hypertension: Secondary | ICD-10-CM | POA: Insufficient documentation

## 2011-12-04 DIAGNOSIS — N390 Urinary tract infection, site not specified: Secondary | ICD-10-CM | POA: Insufficient documentation

## 2011-12-04 DIAGNOSIS — E7800 Pure hypercholesterolemia, unspecified: Secondary | ICD-10-CM | POA: Insufficient documentation

## 2011-12-04 DIAGNOSIS — F411 Generalized anxiety disorder: Secondary | ICD-10-CM | POA: Insufficient documentation

## 2011-12-04 DIAGNOSIS — F329 Major depressive disorder, single episode, unspecified: Secondary | ICD-10-CM | POA: Insufficient documentation

## 2011-12-04 LAB — URINALYSIS, ROUTINE W REFLEX MICROSCOPIC
Bilirubin Urine: NEGATIVE
Glucose, UA: NEGATIVE mg/dL
Ketones, ur: NEGATIVE mg/dL
Nitrite: NEGATIVE
Protein, ur: NEGATIVE mg/dL
Specific Gravity, Urine: 1.028 (ref 1.005–1.030)
Urobilinogen, UA: 0.2 mg/dL (ref 0.0–1.0)
pH: 5 (ref 5.0–8.0)

## 2011-12-04 LAB — URINE MICROSCOPIC-ADD ON

## 2011-12-04 MED ORDER — CEPHALEXIN 500 MG PO CAPS: 500.0000 mg | ORAL_CAPSULE | Freq: Four times a day (QID) | ORAL | Status: AC

## 2011-12-04 NOTE — Discharge Instructions (Signed)
Urinary Tract Infection Infections of the urinary tract can start in several places. A bladder infection (cystitis), a kidney infection (pyelonephritis), and a prostate infection (prostatitis) are different types of urinary tract infections (UTIs). They usually get better if treated with medicines (antibiotics) that kill germs. Take all the medicine until it is gone. You may feel better in a few days, but TAKE ALL MEDICINE or the infection may not respond and may become more difficult to treat. HOME CARE INSTRUCTIONS   Drink enough water and fluids to keep the urine clear or pale yellow. Cranberry juice is especially recommended, in addition to large amounts of water.   Avoid caffeine, tea, and carbonated beverages. They tend to irritate the bladder.   Alcohol may irritate the prostate.   Only take over-the-counter or prescription medicines for pain, discomfort, or fever as directed by your caregiver.  To prevent further infections:  Empty the bladder often. Avoid holding urine for long periods of time.   After a bowel movement, women should cleanse from front to back. Use each tissue only once.   Empty the bladder before and after sexual intercourse.  FINDING OUT THE RESULTS OF YOUR TEST Not all test results are available during your visit. If your or your child's test results are not back during the visit, make an appointment with your caregiver to find out the results. Do not assume everything is normal if you have not heard from your caregiver or the medical facility. It is important for you to follow up on all test results. SEEK MEDICAL CARE IF:   There is back pain.     Your or your child's problems are no better in 3 days.  SEEK IMMEDIATE MEDICAL CARE IF:   There is severe back pain or lower abdominal pain.   You or your child develops chills.   You have a fever.   There is nausea or vomiting.   There is continued burning or discomfort with urination.  MAKE SURE YOU:    Understand these instructions.   Will watch your condition.   Will get help right away if you are not doing well or get worse.  Document Released: 05/31/2005 Document Revised: 08/10/2011 Document Reviewed: 01/03/2007 [facility] Patient Information 2012 [place], [place].

## 2011-12-04 NOTE — ED Provider Notes (Signed)
History     CSN: [id number]  Arrival date & time 12/04/11  1449   First MD Initiated Contact with Patient 12/04/11 1922      Chief Complaint  Patient presents with  . Abdominal Pain  . Urinary Frequency    (Consider location/radiation/quality/duration/timing/severity/associated sxs/prior treatment) HPI Comments: Patient has had suprapubic tenderness and dysuria for the past 4 days.  Patient was here 10/20/11 and diagnosed with a UTI at that time.  A culture was done at that time which showed sensitivity to every antibiotic.  Patient is a 61 y.o. female presenting with dysuria. The history is provided by the patient and medical records.  Dysuria  This is a new problem. Episode onset: four days ago. The problem occurs every urination. The problem has been gradually worsening. The quality of the pain is described as burning. The pain is mild. There has been no fever. Associated symptoms include frequency. Pertinent negatives include no chills, no nausea, no vomiting, no discharge, no hematuria and no flank pain. She has tried nothing for the symptoms. Her past medical history does not include kidney stones.    Past Medical History  Diagnosis Date  . Hypercholesterolemia   . Hypertension   . Glaucoma (increased eye pressure)   . Anxiety   . Depression     Past Surgical History  Procedure Date  . Total knee arthroplasty   . Abdominal hysterectomy     Family History  Problem Relation Age of Onset  . Cancer Maternal Grandmother     ovarian  . Cancer Mother     bone    History  Substance Use Topics  . Smoking status: Never Smoker   . Smokeless tobacco: Not on file  . Alcohol Use: Yes     occassionally    OB History    Grav Para Term Preterm Abortions TAB SAB Ect Mult Living   0         0      Review of Systems  Constitutional: Negative for fever and chills.  Gastrointestinal: Positive for abdominal pain. Negative for nausea and vomiting.  Genitourinary: Positive  for dysuria and frequency. Negative for hematuria and flank pain.    Allergies  Codeine  Home Medications   Current Outpatient Rx  Name Route Sig Dispense Refill  . ACETAMINOPHEN 500 MG PO TABS Oral Take 500 mg by mouth every 6 (six) hours as needed. For pain relief    . ALPRAZOLAM 0.25 MG PO TABS Oral Take 0.25 mg by mouth at bedtime as needed. anxiety    . LATANOPROST 0.005 % OP SOLN Both Eyes Place 1 drop into both eyes at bedtime.    [name] LISINOPRIL 10 MG PO TABS Oral Take 10 mg by mouth daily.    . ADULT MULTIVITAMIN W/MINERALS CH Oral Take 1 tablet by mouth daily.    [name] ROSUVASTATIN CALCIUM 20 MG PO TABS Oral Take 20 mg by mouth daily.    . CEPHALEXIN 500 MG PO CAPS Oral Take 1 capsule (500 mg total) by mouth 4 (four) times daily. 28 capsule 0    BP 112/75  Pulse 82  Temp(Src) 98.2 F (36.8 C) (Oral)  Resp 16  Wt 212 lb (96.163 kg)  SpO2 99%  Physical Exam  Nursing note and vitals reviewed. Constitutional: She appears well-developed and well-nourished. No distress.  HENT:  Head: Normocephalic and atraumatic.  Neck: Normal range of motion.  Cardiovascular: Normal rate, regular rhythm and normal heart sounds.   Pulmonary/Chest: Effort  normal and breath sounds normal.  Abdominal: Soft. She exhibits no mass. There is tenderness in the suprapubic area. There is no rebound, no guarding and no CVA tenderness.  Neurological: She is alert.  Skin: Skin is warm and dry. No rash noted. She is not diaphoretic.  Psychiatric: She has a normal mood and affect.    ED Course  Procedures (including critical care time)  Labs Reviewed  URINALYSIS, ROUTINE W REFLEX MICROSCOPIC - Abnormal; Notable for the following:    APPearance CLOUDY (*)    Hgb urine dipstick LARGE (*)    Leukocytes, UA MODERATE (*)    All other components within normal limits  URINE MICROSCOPIC-ADD ON - Abnormal; Notable for the following:    Squamous Epithelial / LPF FEW (*)    Bacteria, UA MANY (*)    Crystals  URIC ACID CRYSTALS (*)    All other components within normal limits   No results found.   1. UTI (lower urinary tract infection)       MDM  Symptoms and UA consistent with UTI.  Previous urine culture showed sensitivity to Keflex.  Patient was given Keflex for her last UTI and is requesting that medication again.  Patient is afebrile with no CVA tenderness, therefore, do not feel that it is Pyelonephritis.          Pascal Lux [place], PA-C 12/05/11 [phone]

## 2011-12-04 NOTE — Progress Notes (Signed)
Pt listed as self pay with no insurance coverage Pt confirms she is self pay [place] resident.  CM and [facility] community liaison spoke with her Pt offered [facility] services to assist with finding a [place] self pay provider. [facility] package accepted. Has an active orange card effective until 12/29/11

## 2011-12-04 NOTE — ED Notes (Signed)
Pt states "been having pain high up in the pubic area & having to go to the BR a lot, it hurts when the urine comes out"

## 2011-12-05 NOTE — ED Provider Notes (Signed)
Medical screening examination/treatment/procedure(s) were performed by non-physician practitioner and as supervising physician I was immediately available for consultation/collaboration.  Hurman Horn, MD 12/05/11 2123

## 2012-01-06 ENCOUNTER — Emergency Department (HOSPITAL_COMMUNITY): Payer: Self-pay

## 2012-01-06 ENCOUNTER — Observation Stay (HOSPITAL_COMMUNITY)
Admission: EM | Admit: 2012-01-06 | Discharge: 2012-01-07 | Disposition: A | Discharge status: Home / Self Care | Payer: Self-pay | Attending: Family Medicine | Admitting: Family Medicine

## 2012-01-06 ENCOUNTER — Encounter (HOSPITAL_COMMUNITY): Payer: Self-pay | Admitting: Emergency Medicine

## 2012-01-06 DIAGNOSIS — E7800 Pure hypercholesterolemia, unspecified: Secondary | ICD-10-CM

## 2012-01-06 DIAGNOSIS — R079 Chest pain, unspecified: Principal | ICD-10-CM | POA: Insufficient documentation

## 2012-01-06 DIAGNOSIS — K219 Gastro-esophageal reflux disease without esophagitis: Secondary | ICD-10-CM

## 2012-01-06 DIAGNOSIS — Z79899 Other long term (current) drug therapy: Secondary | ICD-10-CM | POA: Insufficient documentation

## 2012-01-06 DIAGNOSIS — Z96659 Presence of unspecified artificial knee joint: Secondary | ICD-10-CM | POA: Insufficient documentation

## 2012-01-06 DIAGNOSIS — H409 Unspecified glaucoma: Secondary | ICD-10-CM | POA: Insufficient documentation

## 2012-01-06 DIAGNOSIS — E785 Hyperlipidemia, unspecified: Secondary | ICD-10-CM

## 2012-01-06 DIAGNOSIS — I10 Essential (primary) hypertension: Secondary | ICD-10-CM

## 2012-01-06 LAB — CARDIAC PANEL(CRET KIN+CKTOT+MB+TROPI)
CK, MB: 2.2 ng/mL (ref 0.3–4.0)
CK, MB: 2 ng/mL (ref 0.3–4.0)
Relative Index: 1.1 (ref 0.0–2.5)
Relative Index: 1.1 (ref 0.0–2.5)
Total CK: 203 U/L — ABNORMAL HIGH (ref 7–177)
Total CK: 188 U/L — ABNORMAL HIGH (ref 7–177)
Troponin I: <0.3 ng/mL (ref <0.30)
Troponin I: <0.3 ng/mL (ref <0.30)

## 2012-01-06 LAB — COMPREHENSIVE METABOLIC PANEL
ALT: 18 U/L (ref 0–35)
AST: 22 U/L (ref 0–37)
Albumin: 3.9 g/dL (ref 3.5–5.2)
Alkaline Phosphatase: 89 U/L (ref 39–117)
BUN: 13 mg/dL (ref 6–23)
CO2: 28 mEq/L (ref 19–32)
Calcium: 9.5 mg/dL (ref 8.4–10.5)
Chloride: 102 mEq/L (ref 96–112)
Creatinine, Ser: 0.69 mg/dL (ref 0.50–1.10)
GFR calc Af Amer: >90 mL/min (ref >90)
GFR calc non Af Amer: >90 mL/min (ref >90)
Glucose, Bld: 105 mg/dL — ABNORMAL HIGH (ref 70–99)
Potassium: 4.4 mEq/L (ref 3.5–5.1)
Sodium: 139 mEq/L (ref 135–145)
Total Bilirubin: 0.3 mg/dL (ref 0.3–1.2)
Total Protein: 7.9 g/dL (ref 6.0–8.3)

## 2012-01-06 LAB — POCT I-STAT TROPONIN I: Troponin i, poc: 0 ng/mL (ref 0.00–0.08)

## 2012-01-06 LAB — CBC
HCT: 37.7 % (ref 36.0–46.0)
HCT: 35.7 % — ABNORMAL LOW (ref 36.0–46.0)
Hemoglobin: 11.9 g/dL — ABNORMAL LOW (ref 12.0–15.0)
Hemoglobin: 11.5 g/dL — ABNORMAL LOW (ref 12.0–15.0)
MCH: 26.5 pg (ref 26.0–34.0)
MCH: 26.8 pg (ref 26.0–34.0)
MCHC: 31.6 g/dL (ref 30.0–36.0)
MCHC: 32.2 g/dL (ref 30.0–36.0)
MCV: 84 fL (ref 78.0–100.0)
MCV: 83.2 fL (ref 78.0–100.0)
Platelets: 302 10*3/uL (ref 150–400)
Platelets: 317 10*3/uL (ref 150–400)
RBC: 4.49 MIL/uL (ref 3.87–5.11)
RBC: 4.29 MIL/uL (ref 3.87–5.11)
RDW: 15.6 % — ABNORMAL HIGH (ref 11.5–15.5)
RDW: 15.5 % (ref 11.5–15.5)
WBC: 4.6 10*3/uL (ref 4.0–10.5)
WBC: 5.3 10*3/uL (ref 4.0–10.5)

## 2012-01-06 LAB — D-DIMER, QUANTITATIVE: D-Dimer, Quant: 0.57 ug/mL-FEU — ABNORMAL HIGH (ref 0.00–0.48)

## 2012-01-06 LAB — CREATININE, SERUM
Creatinine, Ser: 0.66 mg/dL (ref 0.50–1.10)
GFR calc Af Amer: >90 mL/min (ref >90)
GFR calc non Af Amer: >90 mL/min (ref >90)

## 2012-01-06 LAB — POCT I-STAT, CHEM 8
BUN: 14 mg/dL (ref 6–23)
Calcium, Ion: 1.23 mmol/L (ref 1.12–1.32)
Chloride: 105 mEq/L (ref 96–112)
Creatinine, Ser: 0.8 mg/dL (ref 0.50–1.10)
Glucose, Bld: 100 mg/dL — ABNORMAL HIGH (ref 70–99)
HCT: 38 % (ref 36.0–46.0)
Hemoglobin: 12.9 g/dL (ref 12.0–15.0)
Potassium: 4.5 mEq/L (ref 3.5–5.1)
Sodium: 142 mEq/L (ref 135–145)
TCO2: 29 mmol/L (ref 0–100)

## 2012-01-06 LAB — PROTIME-INR
INR: 1.01 (ref 0.00–1.49)
Prothrombin Time: 13.5 seconds (ref 11.6–15.2)

## 2012-01-06 LAB — APTT: aPTT: 32 seconds (ref 24–37)

## 2012-01-06 LAB — MAGNESIUM: Magnesium: 2 mg/dL (ref 1.5–2.5)

## 2012-01-06 MED ORDER — ADULT MULTIVITAMIN W/MINERALS CH
1.0000 | ORAL_TABLET | Freq: Every day | ORAL | Status: DC
  Administered 2012-01-06 – 2012-01-07 (×2): 1 via ORAL
  Filled 2012-01-06 (×2): qty 1

## 2012-01-06 MED ORDER — SODIUM CHLORIDE 0.9 % IJ SOLN: 3.0000 mL | Freq: Two times a day (BID) | INTRAMUSCULAR | Status: DC

## 2012-01-06 MED ORDER — ENOXAPARIN SODIUM 40 MG/0.4ML ~~LOC~~ SOLN
40.0000 mg | SUBCUTANEOUS | Status: DC
  Administered 2012-01-06: 40 mg via SUBCUTANEOUS
  Filled 2012-01-06 (×2): qty 0.4

## 2012-01-06 MED ORDER — LATANOPROST 0.005 % OP SOLN
1.0000 [drp] | Freq: Every day | OPHTHALMIC | Status: DC
  Administered 2012-01-06: 1 [drp] via OPHTHALMIC
  Filled 2012-01-06: qty 2.5

## 2012-01-06 MED ORDER — NITROGLYCERIN 2 % TD OINT
1.0000 [in_us] | TOPICAL_OINTMENT | Freq: Four times a day (QID) | TRANSDERMAL | Status: DC
  Administered 2012-01-06 – 2012-01-07 (×3): 1 [in_us] via TOPICAL
  Filled 2012-01-06 (×2): qty 30

## 2012-01-06 MED ORDER — SODIUM CHLORIDE 0.9 % IV SOLN: 250.0000 mL | INTRAVENOUS | Status: DC | PRN

## 2012-01-06 MED ORDER — SODIUM CHLORIDE 0.9 % IV SOLN
1000.0000 mL | INTRAVENOUS | Status: DC
  Administered 2012-01-06: 1000 mL via INTRAVENOUS

## 2012-01-06 MED ORDER — LISINOPRIL 10 MG PO TABS
10.0000 mg | ORAL_TABLET | Freq: Every day | ORAL | Status: DC
  Administered 2012-01-06: 10 mg via ORAL
  Filled 2012-01-06 (×2): qty 1

## 2012-01-06 MED ORDER — ONDANSETRON HCL 4 MG/2ML IJ SOLN: 4.0000 mg | Freq: Four times a day (QID) | INTRAMUSCULAR | Status: DC | PRN

## 2012-01-06 MED ORDER — ASPIRIN EC 81 MG PO TBEC
81.0000 mg | DELAYED_RELEASE_TABLET | Freq: Every day | ORAL | Status: DC
  Administered 2012-01-07: 81 mg via ORAL
  Filled 2012-01-06: qty 1

## 2012-01-06 MED ORDER — NITROGLYCERIN 0.4 MG SL SUBL
0.4000 mg | SUBLINGUAL_TABLET | SUBLINGUAL | Status: DC | PRN
  Filled 2012-01-06: qty 25

## 2012-01-06 MED ORDER — SODIUM CHLORIDE 0.9 % IJ SOLN: 3.0000 mL | INTRAMUSCULAR | Status: DC | PRN

## 2012-01-06 MED ORDER — ALPRAZOLAM 0.25 MG PO TABS: 0.2500 mg | ORAL_TABLET | Freq: Every evening | ORAL | Status: DC | PRN

## 2012-01-06 MED ORDER — ASPIRIN 81 MG PO CHEW
324.0000 mg | CHEWABLE_TABLET | Freq: Once | ORAL | Status: AC
  Administered 2012-01-06: 324 mg via ORAL
  Filled 2012-01-06: qty 4

## 2012-01-06 MED ORDER — ACETAMINOPHEN 325 MG PO TABS
650.0000 mg | ORAL_TABLET | ORAL | Status: DC | PRN
  Administered 2012-01-07: 650 mg via ORAL
  Filled 2012-01-06: qty 2

## 2012-01-06 MED ORDER — ATORVASTATIN CALCIUM 40 MG PO TABS
40.0000 mg | ORAL_TABLET | Freq: Every day | ORAL | Status: DC
  Administered 2012-01-06: 40 mg via ORAL
  Filled 2012-01-06 (×2): qty 1

## 2012-01-06 MED ORDER — IOHEXOL 300 MG/ML  SOLN
100.0000 mL | Freq: Once | INTRAMUSCULAR | Status: AC | PRN
  Administered 2012-01-06: 100 mL via INTRAVENOUS

## 2012-01-06 NOTE — ED Provider Notes (Signed)
History     CSN: [id number]  Arrival date & time 01/06/12  1354   First MD Initiated Contact with Patient 01/06/12 1421      Chief Complaint  Patient presents with  . Chest Pain    (Consider location/radiation/quality/duration/timing/severity/associated sxs/prior treatment) Patient is a 61 y.o. female presenting with chest pain. The history is provided by the patient.  Chest Pain Episode onset: Pt has had some episodes on the right chest over the last few months.  The episode started this morning while cleaning. Duration of episode(s) is 2 hours. The chest pain is improving. The pain is associated with breathing and exertion. At its most intense, the pain is at 8/10. The pain is currently at 3/10. The quality of the pain is described as tightness and crushing. Pertinent negatives for primary symptoms include no fever, no shortness of breath, no cough, no nausea and no dizziness.  Her past medical history is significant for DVT.  Pertinent negatives for past medical history include no CAD and no PE.  Pertinent negatives for family medical history include: no CAD in family.   Pt has had increasing stress lately and is not sure if this is related.  Past Medical History  Diagnosis Date  . Hypercholesterolemia   . Hypertension   . Glaucoma (increased eye pressure)   . Anxiety   . Depression     Past Surgical History  Procedure Date  . Total knee arthroplasty   . Abdominal hysterectomy     Family History  Problem Relation Age of Onset  . Cancer Maternal Grandmother     ovarian  . Cancer Mother     bone    History  Substance Use Topics  . Smoking status: Never Smoker   . Smokeless tobacco: Not on file  . Alcohol Use: No     occassionally    OB History    Grav Para Term Preterm Abortions TAB SAB Ect Mult Living   0         0      Review of Systems  Constitutional: Negative for fever.  Respiratory: Negative for cough and shortness of breath.   Cardiovascular:  Positive for chest pain.  Gastrointestinal: Negative for nausea.  Neurological: Negative for dizziness.  All other systems reviewed and are negative.    Allergies  Codeine  Home Medications   Current Outpatient Rx  Name Route Sig Dispense Refill  . ACETAMINOPHEN 500 MG PO TABS Oral Take 500 mg by mouth every 6 (six) hours as needed. For pain relief    . ALPRAZOLAM 0.25 MG PO TABS Oral Take 0.25 mg by mouth at bedtime as needed. anxiety    . LATANOPROST 0.005 % OP SOLN Both Eyes Place 1 drop into both eyes at bedtime.    [name] LISINOPRIL 10 MG PO TABS Oral Take 10 mg by mouth daily.    . ADULT MULTIVITAMIN W/MINERALS CH Oral Take 1 tablet by mouth daily.    [name] ROSUVASTATIN CALCIUM 20 MG PO TABS Oral Take 20 mg by mouth daily.    . TROLAMINE SALICYLATE 10 % EX CREA Topical Apply 1 application topically as needed. Applies to legs for aches and pain      BP 120/80  Pulse 69  Temp(Src) 98.3 F (36.8 C) (Oral)  Resp 25  SpO2 100%  Physical Exam  Nursing note and vitals reviewed. Constitutional: She appears well-developed and well-nourished. No distress.  HENT:  Head: Normocephalic and atraumatic.  Right Ear: External ear  normal.  Left Ear: External ear normal.  Eyes: Conjunctivae are normal. Right eye exhibits no discharge. Left eye exhibits no discharge. No scleral icterus.  Neck: Neck supple. No tracheal deviation present.  Cardiovascular: Normal rate, regular rhythm and intact distal pulses.   Pulmonary/Chest: Effort normal and breath sounds normal. No stridor. No respiratory distress. She has no wheezes. She has no rales.  Abdominal: Soft. Bowel sounds are normal. She exhibits no distension. There is no tenderness. There is no rebound and no guarding.  Musculoskeletal: She exhibits no edema and no tenderness.  Neurological: She is alert. She has normal strength. No sensory deficit. Cranial nerve deficit:  no gross defecits noted. She exhibits normal muscle tone. She displays no  seizure activity. Coordination normal.  Skin: Skin is warm and dry. No rash noted.  Psychiatric: She has a normal mood and affect.    ED Course  Procedures (including critical care time)  Rate: 63  Rhythm: normal sinus rhythm  QRS Axis: normal  Intervals: normal  ST/T Wave abnormalities: normal  Conduction Disutrbances:none  Narrative Interpretation: nl  Old EKG Reviewed: none available  Medications  trolamine salicylate (ASPERCREME) 10 % cream (not administered)  0.9 %  sodium chloride infusion (1000 mL Intravenous New Bag/Given 01/06/12 1534)  nitroGLYCERIN (NITROSTAT) SL tablet 0.4 mg (not administered)  nitroGLYCERIN (NITROGLYN) 2 % ointment 1 inch (1 inch Topical Given 01/06/12 1532)  aspirin chewable tablet 324 mg (324 mg Oral Given 01/06/12 1527)    Labs Reviewed  CBC - Abnormal; Notable for the following:    Hemoglobin 11.9 (*)    RDW 15.6 (*)    All other components within normal limits  COMPREHENSIVE METABOLIC PANEL - Abnormal; Notable for the following:    Glucose, Bld 105 (*)    All other components within normal limits  D-DIMER, QUANTITATIVE - Abnormal; Notable for the following:    D-Dimer, Quant 0.57 (*)    All other components within normal limits  POCT I-STAT, CHEM 8 - Abnormal; Notable for the following:    Glucose, Bld 100 (*)    All other components within normal limits  PROTIME-INR  APTT  POCT I-STAT TROPONIN I   Dg Chest 2 View  01/06/2012  *RADIOLOGY REPORT*  Clinical Data: Acute onset of mid chest pain and shortness of breath earlier today.  CHEST - 2 VIEW 01/06/2012:  Comparison: None.  Findings: Cardiac silhouette normal in size.  Thoracic aorta mildly tortuous.  Hilar and mediastinal contours otherwise unremarkable. Linear atelectasis or scar in the left lower lobe.  Lungs otherwise clear.  Bronchovascular markings normal.  No pleural effusions. Degenerative changes involving the thoracic spine.  IMPRESSION: Linear atelectasis or scar in the left lower  lobe.  No acute cardiopulmonary disease otherwise.  Original Report Authenticated By: Arnell Sieving, M.D.    MDM  Pt with cardiac risk factors of HTN and increased cholesterol.  No signs of STEMI on ekg.  Recommend admission for serial observation and stress testing considering her history and symptoms.  Symptoms not suggestive for PE but with her increased d dimer will order a CT angio of the chest.  Will order dose of lovenox empirically.        Celene Kras, MD 01/06/12 [phone]

## 2012-01-06 NOTE — H&P (Signed)
PCP:  None reported.  Chief Complaint:  Chest tightness  HPI: Pt is a 61 y/o AAF non smoker with PMH of HPL, HTN, Anxiety/Depression, also reportedly history of DVT (although patient denies this and I don't have any supporting documentation).  Patient presents to the hospital complaining of non radiating chest discomfort that is described as tightness that is made with exertion, deep breaths, and direct palpation over chest per patient.  She said that she was cleaning the house when this occurred.  States that years ago she was told by a doctor from the [place] [facility] [facility] that she had GERD and was on a "purple pill."  Has been off of medication for GERD for years.  Currently denies any recent fever, chills, nausea, emesis, palpitations.   While in the ED patient had chest x ray which was negative. CBC/BMP which was within normal limits. Ddimer was obtained which came back elevated and thus a ct scan of her chest was ordered.  U/A showed cloudy urine with moderate leukocytes, + for blood (not frank blood), and many Bacteria.  Patient is resting comfortably in bed and states that breathing deep and directly palpating chest causes discomfort.  Allergies:   Allergies  Allergen Reactions  . Codeine Hypertension      Past Medical History  Diagnosis Date  . Hypercholesterolemia   . Hypertension   . Glaucoma (increased eye pressure)   . Anxiety   . Depression     Past Surgical History  Procedure Date  . Total knee arthroplasty   . Abdominal hysterectomy     Prior to Admission medications   Medication Sig Start Date End Date Taking? Authorizing Provider  acetaminophen (TYLENOL) 500 MG tablet Take 500 mg by mouth every 6 (six) hours as needed. For pain relief   Yes Historical Provider, MD  ALPRAZolam (XANAX) 0.25 MG tablet Take 0.25 mg by mouth at bedtime as needed. anxiety   Yes Historical Provider, MD  latanoprost (XALATAN) 0.005 % ophthalmic solution Place 1 drop into both eyes at  bedtime.   Yes Historical Provider, MD  lisinopril (PRINIVIL,ZESTRIL) 10 MG tablet Take 10 mg by mouth daily.   Yes Historical Provider, MD  Multiple Vitamin (MULITIVITAMIN WITH MINERALS) TABS Take 1 tablet by mouth daily.   Yes Historical Provider, MD  rosuvastatin (CRESTOR) 20 MG tablet Take 20 mg by mouth daily.   Yes Historical Provider, MD  trolamine salicylate (ASPERCREME) 10 % cream Apply 1 application topically as needed. Applies to legs for aches and pain   Yes Historical Provider, MD    Social History:  reports that she has never smoked. She does not have any smokeless tobacco history on file. She reports that she does not drink alcohol or use illicit drugs.  Family History  Problem Relation Age of Onset  . Cancer Maternal Grandmother     ovarian  . Cancer Mother     bone    Review of Systems:  Constitutional: Denies fever, chills, diaphoresis, appetite change and fatigue.  HEENT: Denies photophobia, eye pain, redness, hearing loss, ear pain, congestion, sore throat, rhinorrhea, sneezing, mouth sores, trouble swallowing, neck pain, neck stiffness and tinnitus.   Respiratory: Denies SOB, DOE, cough, + chest tightness,  And denies wheezing.   Cardiovascular:+ chest pain,denies palpitations and leg swelling.  Gastrointestinal: Denies nausea, vomiting, abdominal pain, diarrhea, constipation, blood in stool and abdominal distention.  Genitourinary: Denies dysuria, urgency, frequency, hematuria, flank pain and difficulty urinating.  Musculoskeletal: Denies myalgias, back pain, joint swelling, arthralgias  and gait problem.  Skin: Denies pallor, rash and wound.  Neurological: Denies dizziness, seizures, syncope, weakness, light-headedness, numbness and headaches.  Hematological: Denies adenopathy. Easy bruising, personal or family bleeding history  Psychiatric/Behavioral: Denies suicidal ideation, mood changes, confusion, nervousness, sleep disturbance and agitation   Physical  Exam: Blood pressure 123/92, pulse 69, temperature 98.3 F (36.8 C), temperature source Oral, resp. rate 25, SpO2 100.00%. General: Alert, awake, oriented x3, in no acute distress. HEENT: Atraumatic normocephalic, EOMI, PERRLA,  No bruits, no goiter. Heart: Regular rate and rhythm, without murmurs, rubs, gallops. Lungs: Clear to auscultation bilaterally. No wheezes Musculoskeletal:  Patient has pain on palpation over chest. Abdomen: Soft, nontender, nondistended, positive bowel sounds. Extremities: No clubbing cyanosis or edema with positive pedal pulses. Neuro: Grossly intact, nonfocal. Skin: no diaphoresis or edema  Labs on Admission:  Results for orders placed during the hospital encounter of 01/06/12 (from the past 48 hour(s))  CBC     Status: Abnormal   Collection Time   01/06/12  3:20 PM      Component Value Range Comment   WBC 4.6  4.0 - 10.5 (K/uL)    RBC 4.49  3.87 - 5.11 (MIL/uL)    Hemoglobin 11.9 (*) 12.0 - 15.0 (g/dL)    HCT 16.1  09.6 - 04.5 (%)    MCV 84.0  78.0 - 100.0 (fL)    MCH 26.5  26.0 - 34.0 (pg)    MCHC 31.6  30.0 - 36.0 (g/dL)    RDW 40.9 (*) 81.1 - 15.5 (%)    Platelets 302  150 - 400 (K/uL)   COMPREHENSIVE METABOLIC PANEL     Status: Abnormal   Collection Time   01/06/12  3:20 PM      Component Value Range Comment   Sodium 139  135 - 145 (mEq/L)    Potassium 4.4  3.5 - 5.1 (mEq/L)    Chloride 102  96 - 112 (mEq/L)    CO2 28  19 - 32 (mEq/L)    Glucose, Bld 105 (*) 70 - 99 (mg/dL)    BUN 13  6 - 23 (mg/dL)    Creatinine, Ser 9.14  0.50 - 1.10 (mg/dL)    Calcium 9.5  8.4 - 10.5 (mg/dL)    Total Protein 7.9  6.0 - 8.3 (g/dL)    Albumin 3.9  3.5 - 5.2 (g/dL)    AST 22  0 - 37 (U/L)    ALT 18  0 - 35 (U/L)    Alkaline Phosphatase 89  39 - 117 (U/L)    Total Bilirubin 0.3  0.3 - 1.2 (mg/dL)    GFR calc non Af Amer >90  >90 (mL/min)    GFR calc Af Amer >90  >90 (mL/min)   PROTIME-INR     Status: Normal   Collection Time   01/06/12  3:20 PM       Component Value Range Comment   Prothrombin Time 13.5  11.6 - 15.2 (seconds)    INR 1.01  0.00 - 1.49    APTT     Status: Normal   Collection Time   01/06/12  3:20 PM      Component Value Range Comment   aPTT 32  24 - 37 (seconds)   D-DIMER, QUANTITATIVE     Status: Abnormal   Collection Time   01/06/12  3:20 PM      Component Value Range Comment   D-Dimer, Quant 0.57 (*) 0.00 - 0.48 (ug/mL-FEU)   POCT I-STAT,  CHEM 8     Status: Abnormal   Collection Time   01/06/12  3:28 PM      Component Value Range Comment   Sodium 142  135 - 145 (mEq/L)    Potassium 4.5  3.5 - 5.1 (mEq/L)    Chloride 105  96 - 112 (mEq/L)    BUN 14  6 - 23 (mg/dL)    Creatinine, Ser 5.40  0.50 - 1.10 (mg/dL)    Glucose, Bld 981 (*) 70 - 99 (mg/dL)    Calcium, Ion 1.91  1.12 - 1.32 (mmol/L)    TCO2 29  0 - 100 (mmol/L)    Hemoglobin 12.9  12.0 - 15.0 (g/dL)    HCT 47.8  29.5 - 62.1 (%)   POCT I-STAT TROPONIN I     Status: Normal   Collection Time   01/06/12  3:31 PM      Component Value Range Comment   Troponin i, poc 0.00  0.00 - 0.08 (ng/mL)    Comment 3              Radiological Exams on Admission: Dg Chest 2 View  01/06/2012  *RADIOLOGY REPORT*  Clinical Data: Acute onset of mid chest pain and shortness of breath earlier today.  CHEST - 2 VIEW 01/06/2012:  Comparison: None.  Findings: Cardiac silhouette normal in size.  Thoracic aorta mildly tortuous.  Hilar and mediastinal contours otherwise unremarkable. Linear atelectasis or scar in the left lower lobe.  Lungs otherwise clear.  Bronchovascular markings normal.  No pleural effusions. Degenerative changes involving the thoracic spine.  IMPRESSION: Linear atelectasis or scar in the left lower lobe.  No acute cardiopulmonary disease otherwise.  Original Report Authenticated By: Arnell Sieving, M.D.    Assessment/Plan Active Problems:  1) Chest pain:  Will admit patient for rule out ACS.  Risk factors are HPL and HTN.  At this point pain sounds atypical.   Will trend cardiac enzymes, obtain EKG in the AM, and place on telemetry.  Will continue supplemental oxygen until ruled out. Patient has received nitroglycerin and Aspirin 324 mg chewable.  2) HTN:  WIll plan on continuing home regimen. Continue to monitor blood pressures  3) HPL: Continue home regimen.  Currently stable  4) GERD:  Start patient on protonix and reevaluate.  5) DVT prophylaxis: Consider continuing lovenox while in house.   Time Spent on Admission: >60 minutes obtaining history, reviewing chart, placing orders, updating information systems, documenting, placing orders, physical exam  Penny Pia [facility] Pager: [phone] 01/06/2012, 4:35 PM

## 2012-01-06 NOTE — ED Notes (Signed)
Pt states that she started having chest pain at 1100 today. She takes Aspirin every once in awhile, but she hasn't taken any today. Her pain level is currently a 4/10.

## 2012-01-06 NOTE — ED Notes (Signed)
Patient transported to X-ray 

## 2012-01-06 NOTE — ED Notes (Signed)
RN to obtain labs with the start of IV °

## 2012-01-06 NOTE — ED Notes (Signed)
Pt presenting to ed with c/o chest pain onset this am pt states "it felt like bricks were sitting on my chest and like my chest was going to cave in. Pt denies shortness of breath, nausea, vomiting and radiating pain at this time. Pt is alert and oriented.

## 2012-01-07 DIAGNOSIS — I10 Essential (primary) hypertension: Secondary | ICD-10-CM

## 2012-01-07 DIAGNOSIS — K219 Gastro-esophageal reflux disease without esophagitis: Secondary | ICD-10-CM

## 2012-01-07 DIAGNOSIS — E785 Hyperlipidemia, unspecified: Secondary | ICD-10-CM

## 2012-01-07 DIAGNOSIS — R079 Chest pain, unspecified: Secondary | ICD-10-CM

## 2012-01-07 LAB — BASIC METABOLIC PANEL
BUN: 13 mg/dL (ref 6–23)
CO2: 25 mEq/L (ref 19–32)
Calcium: 9.5 mg/dL (ref 8.4–10.5)
Chloride: 102 mEq/L (ref 96–112)
Creatinine, Ser: 0.7 mg/dL (ref 0.50–1.10)
GFR calc Af Amer: >90 mL/min (ref >90)
GFR calc non Af Amer: >90 mL/min (ref >90)
Glucose, Bld: 107 mg/dL — ABNORMAL HIGH (ref 70–99)
Potassium: 3.5 mEq/L (ref 3.5–5.1)
Sodium: 138 mEq/L (ref 135–145)

## 2012-01-07 LAB — CARDIAC PANEL(CRET KIN+CKTOT+MB+TROPI)
CK, MB: 1.8 ng/mL (ref 0.3–4.0)
Relative Index: 1.2 (ref 0.0–2.5)
Total CK: 154 U/L (ref 7–177)
Troponin I: <0.3 ng/mL (ref <0.30)

## 2012-01-07 LAB — LIPID PANEL
Cholesterol: 189 mg/dL (ref 0–200)
HDL: 47 mg/dL (ref >39)
LDL Cholesterol: 109 mg/dL — ABNORMAL HIGH (ref 0–99)
Total CHOL/HDL Ratio: 4 RATIO
Triglycerides: 164 mg/dL — ABNORMAL HIGH (ref <150)
VLDL: 33 mg/dL (ref 0–40)

## 2012-01-07 LAB — CBC
HCT: 34.9 % — ABNORMAL LOW (ref 36.0–46.0)
Hemoglobin: 11.3 g/dL — ABNORMAL LOW (ref 12.0–15.0)
MCH: 27.3 pg (ref 26.0–34.0)
MCHC: 32.4 g/dL (ref 30.0–36.0)
MCV: 84.3 fL (ref 78.0–100.0)
Platelets: 286 10*3/uL (ref 150–400)
RBC: 4.14 MIL/uL (ref 3.87–5.11)
RDW: 15.8 % — ABNORMAL HIGH (ref 11.5–15.5)
WBC: 5.4 10*3/uL (ref 4.0–10.5)

## 2012-01-07 LAB — TSH: TSH: 1.098 u[IU]/mL (ref 0.350–4.500)

## 2012-01-07 NOTE — Discharge Summary (Signed)
Melissa [name],Melissa [name] Female, 61 y.o., June 17, 1951  Admit date: 01/06/2012 Discharge date: 01/07/2012  Primary Care Physician:  Dr. Gerri Spore   Discharge Diagnoses:   No resolved problems to display.  Active Hospital Problems  Diagnoses Date Noted   . Chest pain 01/06/2012   . HTN (hypertension) 01/06/2012   . GERD (gastroesophageal reflux disease) 01/06/2012   . Hypercholesteremia 01/06/2012     Resolved Hospital Problems  Diagnoses Date Noted Date Resolved     DISCHARGE MEDICATION: Medication List  As of 01/07/2012 11:12 AM   TAKE these medications         acetaminophen 500 MG tablet   Commonly known as: TYLENOL   Take 500 mg by mouth every 6 (six) hours as needed. For pain relief      ALPRAZolam 0.25 MG tablet   Commonly known as: XANAX   Take 0.25 mg by mouth at bedtime as needed. anxiety      latanoprost 0.005 % ophthalmic solution   Commonly known as: XALATAN   Place 1 drop into both eyes at bedtime.      lisinopril 10 MG tablet   Commonly known as: PRINIVIL,ZESTRIL   Take 10 mg by mouth daily.      mulitivitamin with minerals Tabs   Take 1 tablet by mouth daily.      rosuvastatin 20 MG tablet   Commonly known as: CRESTOR   Take 20 mg by mouth daily.      trolamine salicylate 10 % cream   Commonly known as: ASPERCREME   Apply 1 application topically as needed. Applies to legs for aches and pain              Consults:     SIGNIFICANT DIAGNOSTIC STUDIES:  Dg Chest 2 View  01/06/2012  *RADIOLOGY REPORT*  Clinical Data: Acute onset of mid chest pain and shortness of breath earlier today.  CHEST - 2 VIEW 01/06/2012:  Comparison: None.  Findings: Cardiac silhouette normal in size.  Thoracic aorta mildly tortuous.  Hilar and mediastinal contours otherwise unremarkable. Linear atelectasis or scar in the left lower lobe.  Lungs otherwise clear.  Bronchovascular markings normal.  No pleural effusions. Degenerative changes involving the thoracic spine.  IMPRESSION:  Linear atelectasis or scar in the left lower lobe.  No acute cardiopulmonary disease otherwise.  Original Report Authenticated By: Arnell Sieving, M.D.   Ct Angio Chest W/cm &/or Wo Cm  01/06/2012  *RADIOLOGY REPORT*  Clinical Data: Pain, elevated D-dimer  CT ANGIOGRAPHY CHEST  Technique:  Multidetector CT imaging of the chest using the standard protocol during bolus administration of intravenous contrast. Multiplanar reconstructed images including MIPs were obtained and reviewed to evaluate the vascular anatomy.  Contrast: OMNIPAQUE IOHEXOL 300 MG/ML  SOLN  Comparison: None.  Findings: Both lungs are clear.  There is no axillary, mediastinal, or hilar adenopathy.  The osseous structures and visualized upper abdomen are unremarkable.  The thoracic aorta is normal in caliber and appearance.  There are no filling defects within either pulmonary arterial system to suggest a segmental or subsegmental pulmonary embolus.  IMPRESSION: There is no evidence of pulmonary embolus or other acute abnormality.  Original Report Authenticated By: Brandon Melnick, M.D.    CARDIAC CATH & OTHER PROCEDURES:Patient while here had CT of chest which was negative for pulmonary embolism or acute processes.  Cardiac enzymes were negative x 3.  EKG was normal sinus without any st elevation or depression.  No results found for this or any previous  visit (from the past 240 hour(s)).  BRIEF ADMITTING H & P:  Pt is a 61 y/o AAF non smoker with PMH of HPL, HTN, Anxiety/Depression, also reportedly history of DVT (although patient denies this and I don't have any supporting documentation). Patient presents to the hospital complaining of non radiating chest discomfort that is described as tightness that is made with exertion, deep breaths, and direct palpation over chest per patient. She said that she was cleaning the house when this occurred.  Pt has a history of reflux of which she is currently not on any medication she reports.   So was admitted for ACS rule out.  Given that she had an elevated d dimer a ct of chest was obtained which was negative for PE.    After further testing patient's work up was negative.  Cardiac enzymes and EKG remained within normal limits.  Since pain is reproducible by palpation this may be musculoskeletal but may also be secondary to her untreated Reflux.  I have asked patient to follow up with primary care physician.   No resolved problems to display.  Active Hospital Problems  Diagnoses Date Noted   . Chest pain 01/06/2012   . HTN (hypertension) 01/06/2012   . GERD (gastroesophageal reflux disease) 01/06/2012   . Hypercholesteremia 01/06/2012     Resolved Hospital Problems  Diagnoses Date Noted Date Resolved     Disposition and Follow-up:  Discharge Orders    Future Orders Please Complete By Expires   Diet - low sodium heart healthy      Increase activity slowly      Discharge instructions      Comments:   Please be sure to follow up with your primary care physician in 1-2 weeks.  They should consider cardiac stress testing.  Also for your reflux you can take over the counter omeprazole and discuss with your primary care doctor on next visit.   Call MD for:  temperature >100.4      Call MD for:  difficulty breathing, headache or visual disturbances           DISCHARGE EXAM:  General: Alert, awake, oriented x3, in no acute distress. HEENT: Atraumatic, normocephalic, EOMI, normal exterior ears and nose, No bruits, no goiter. Heart: Regular rate and rhythm, without murmurs, rubs, gallops. Lungs: Clear to auscultation bilaterally. Abdomen: Soft, nontender, nondistended, positive bowel sounds. Extremities: No clubbing cyanosis or edema with positive pedal pulses. Neuro: Grossly intact, nonfocal. Skin: no contusions or diaphoresis present  Blood pressure 112/76, pulse 78, temperature 98.1 [name] (36.7 C), temperature source Oral, resp. rate 20, height 5\' 6"  (1.676 m), weight  96.1 kg (211 lb 13.8 oz), SpO2 97.00%.   Basename 01/07/12 0536 01/06/12 1815 01/06/12 1528 01/06/12 1520  NA 138 -- 142 --  K 3.5 -- 4.5 --  CL 102 -- 105 --  CO2 25 -- -- 28  GLUCOSE 107* -- 100* --  BUN 13 -- 14 --  CREATININE 0.70 0.66 -- --  CALCIUM 9.5 -- -- 9.5  MG -- 2.0 -- --  PHOS -- -- -- --    Basename 01/06/12 1520  AST 22  ALT 18  ALKPHOS 89  BILITOT 0.3  PROT 7.9  ALBUMIN 3.9   No results found for this basename: LIPASE:2,AMYLASE:2 in the last 72 hours  Basename 01/07/12 0536 01/06/12 1815  WBC 5.4 5.3  NEUTROABS -- --  HGB 11.3* 11.5*  HCT 34.9* 35.7*  MCV 84.3 83.2  PLT 286 317  SignedPenny Pia M.D. 01/07/2012, 11:12 AM

## 2012-01-08 LAB — URINE CULTURE
Colony Count: 55000
Culture  Setup Time: 201305050124
Special Requests: NORMAL

## 2012-04-08 ENCOUNTER — Telehealth: Payer: Self-pay | Admitting: Family Medicine

## 2012-04-08 ENCOUNTER — Ambulatory Visit (INDEPENDENT_AMBULATORY_CARE_PROVIDER_SITE_OTHER): Payer: PRIVATE HEALTH INSURANCE | Admitting: Family Medicine

## 2012-04-08 VITALS — BP 124/76 | HR 88 | Temp 98.6°F | Resp 17 | Ht 66.0 in | Wt 210.0 lb

## 2012-04-08 DIAGNOSIS — H16009 Unspecified corneal ulcer, unspecified eye: Secondary | ICD-10-CM

## 2012-04-08 DIAGNOSIS — H53149 Visual discomfort, unspecified: Secondary | ICD-10-CM

## 2012-04-08 NOTE — Progress Notes (Signed)
[facility] [address] [place] [address] [phone]  Date:  04/08/2012   Name:  ALYSON KI   DOB:  1951/06/26   MRN:  [id number]  PCP:  No primary provider on file.    Chief Complaint: Eye Injury   History of Present Illness:  Amarea F Dieter is a 61 y.o. very pleasant female patient who presents with the following:  On Saturday she was spraying an insect killer- she was spraying it into the air trying to kill mosquitos, and spray went into her right eye.  She flushed it with water.  Her eye stung, but did not seem too bad. However, it has become more sore and sensitive, and has been tearing/ running over the last couple of days.  She had photophobia today as well.  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   Her optholmologist is Dr. Daleen Squibb on [address] with [facility].   She does wear glasses. She does note decrease in her visual acuity on the right.  The left eye is ok.    She does not have to use any glaucoma drops recently as her pressure has been ok.    Patient Active Problem List  Diagnosis  . Chest pain  . HTN (hypertension)  . GERD (gastroesophageal reflux disease)  . Hypercholesteremia    Past Medical History  Diagnosis Date  . Hypercholesterolemia   . Hypertension   . Glaucoma (increased eye pressure)   . Anxiety   . Depression     Past Surgical History  Procedure Date  . Total knee arthroplasty   . Abdominal hysterectomy     History  Substance Use Topics  . Smoking status: Never Smoker   . Smokeless tobacco: Not on file  . Alcohol Use: No     occassionally    Family History  Problem Relation Age of Onset  . Cancer Maternal Grandmother     ovarian  . Cancer Mother     bone    Allergies  Allergen Reactions  . Codeine Hypertension    Medication list has been reviewed and updated.  Current Outpatient Prescriptions on File Prior to Visit  Medication Sig Dispense Refill  . acetaminophen (TYLENOL) 500 MG tablet Take  500 mg by mouth every 6 (six) hours as needed. For pain relief      . ALPRAZolam (XANAX) 0.25 MG tablet Take 0.25 mg by mouth at bedtime as needed. anxiety      . lisinopril (PRINIVIL,ZESTRIL) 10 MG tablet Take 10 mg by mouth daily.      . Multiple Vitamin (MULITIVITAMIN WITH MINERALS) TABS Take 1 tablet by mouth daily.      . rosuvastatin (CRESTOR) 20 MG tablet Take 20 mg by mouth daily.      [name] trolamine salicylate (ASPERCREME) 10 % cream Apply 1 application topically as needed. Applies to legs for aches and pain      . latanoprost (XALATAN) 0.005 % ophthalmic solution Place 1 drop into both eyes at bedtime.        Review of Systems:  As per HPI- otherwise negative.   Physical Examination: Filed Vitals:   04/08/12 1023  BP: 124/76  Pulse: 88  Temp: 98.6 F (37 C)  Resp: 17   Filed Vitals:   04/08/12 1023  Height: 5\' 6"  (1.676 m)  Weight: 210 lb (95.255 kg)   Body mass index is 33.89 kg/(m^2). Ideal Body Weight: Weight in (lb) to have BMI = 25: 154.6  GEN: WDWN, NAD, Non-toxic, Alert & Oriented x 3 HEENT: Atraumatic, Normocephalic.  Holding right eye closed- she was able to open it after proparacaine drops used.  PEERL, EOMI, limited fundoscopic exam wnl. Minimal swelling of the upper lid.  There is a small (about 2mm diameter) ulcer located over the inferior part of her right iris.   Ears and Nose: No external deformity. EXTR: No clubbing/cyanosis/edema NEURO: Normal gait.  PSYCH: Normally interactive. Conversant. Not depressed or anxious appearing.  Calm demeanor.    Assessment and Plan: 1. Corneal ulcer   2. 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  Will be seen by her opthalmology clinic today- called to arrange this appointment.   Abbe Amsterdam, MD

## 2012-05-21 ENCOUNTER — Emergency Department (HOSPITAL_COMMUNITY)
Admission: EM | Admit: 2012-05-21 | Discharge: 2012-05-21 | Disposition: A | Discharge status: Home / Self Care | Payer: PRIVATE HEALTH INSURANCE | Attending: Emergency Medicine | Admitting: Emergency Medicine

## 2012-05-21 ENCOUNTER — Emergency Department (HOSPITAL_COMMUNITY): Payer: PRIVATE HEALTH INSURANCE

## 2012-05-21 ENCOUNTER — Encounter (HOSPITAL_COMMUNITY): Payer: Self-pay | Admitting: *Deleted

## 2012-05-21 DIAGNOSIS — Z79899 Other long term (current) drug therapy: Secondary | ICD-10-CM | POA: Insufficient documentation

## 2012-05-21 DIAGNOSIS — M25539 Pain in unspecified wrist: Secondary | ICD-10-CM | POA: Insufficient documentation

## 2012-05-21 DIAGNOSIS — F329 Major depressive disorder, single episode, unspecified: Secondary | ICD-10-CM | POA: Insufficient documentation

## 2012-05-21 DIAGNOSIS — Z885 Allergy status to narcotic agent status: Secondary | ICD-10-CM | POA: Insufficient documentation

## 2012-05-21 DIAGNOSIS — M25559 Pain in unspecified hip: Secondary | ICD-10-CM | POA: Insufficient documentation

## 2012-05-21 DIAGNOSIS — I10 Essential (primary) hypertension: Secondary | ICD-10-CM | POA: Insufficient documentation

## 2012-05-21 DIAGNOSIS — H409 Unspecified glaucoma: Secondary | ICD-10-CM | POA: Insufficient documentation

## 2012-05-21 DIAGNOSIS — M25532 Pain in left wrist: Secondary | ICD-10-CM

## 2012-05-21 DIAGNOSIS — Z96659 Presence of unspecified artificial knee joint: Secondary | ICD-10-CM | POA: Insufficient documentation

## 2012-05-21 DIAGNOSIS — E785 Hyperlipidemia, unspecified: Secondary | ICD-10-CM | POA: Insufficient documentation

## 2012-05-21 DIAGNOSIS — F3289 Other specified depressive episodes: Secondary | ICD-10-CM | POA: Insufficient documentation

## 2012-05-21 DIAGNOSIS — F411 Generalized anxiety disorder: Secondary | ICD-10-CM | POA: Insufficient documentation

## 2012-05-21 DIAGNOSIS — M25552 Pain in left hip: Secondary | ICD-10-CM

## 2012-05-21 MED ORDER — DICLOFENAC SODIUM 1 % TD GEL: 4.0000 g | Freq: Four times a day (QID) | TRANSDERMAL | Status: DC

## 2012-05-21 NOTE — ED Provider Notes (Signed)
History     CSN: [id number]  Arrival date & time 05/21/12  1425   First MD Initiated Contact with Patient 05/21/12 1643      Chief Complaint  Patient presents with  . Back Pain    Left, lower back  . Wrist Pain    Left    (Consider location/radiation/quality/duration/timing/severity/associated sxs/prior treatment) HPI Comments: Patient reports gradually increasing left hip pain and left wrist pain.  Left hip pain began about 6- 8 weeks ago without injury.  The pain is described as "shooting," begins in her posterior left hip and radiates into her groin, comes on with movement and certain positions.  Worse after sitting for an extended period of time.  Pain is 9/10 intensity at its worst.  Pt also notes pain in left wrist, states this is a less intense pain, only occurs when she is attempting to grab things or open jars.  Located in her ulnar wrist.  Denies any injury with this either, this has been present for several months.  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  States she is using ibuprofen and aspercreme for her hip, which helps temporarily.  Denies needing any medication for her wrist.  Denies fevers, back pain, loss of control of bowel or bladder, weakness or numbness of her extremities.    Patient is a 61 y.o. female presenting with back pain and wrist pain. The history is provided by the patient.  Back Pain  Pertinent negatives include no fever, no numbness, no dysuria and no weakness.  Wrist Pain Pertinent negatives include no fever, numbness or weakness.    Past Medical History  Diagnosis Date  . Hypercholesterolemia   . Hypertension   . Glaucoma (increased eye pressure)   . Anxiety   . Depression     Past Surgical History  Procedure Date  . Total knee arthroplasty   . Abdominal hysterectomy      Family History  Problem Relation Age of Onset  . Cancer Maternal Grandmother     ovarian  . Cancer Mother     bone    History  Substance Use Topics  . Smoking status: Never Smoker   . Smokeless tobacco: Never Used  . Alcohol Use: No     occassionally    OB History    Grav Para Term Preterm Abortions TAB SAB Ect Mult Living   0         0      Review of Systems  Constitutional: Negative for fever.  Gastrointestinal: Negative for diarrhea and constipation.  Genitourinary: Negative for dysuria, urgency and frequency.  Musculoskeletal: Negative for back pain.  Neurological: Negative for weakness and numbness.    Allergies  Codeine  Home Medications   Current Outpatient Rx  Name Route Sig Dispense Refill  . ALPRAZOLAM 0.25 MG PO TABS Oral Take 0.25 mg by mouth at bedtime as needed. anxiety    . IBUPROFEN 200 MG PO TABS Oral Take 400 mg by mouth every 6 (six) hours as needed. Pain    . LISINOPRIL 10 MG PO TABS Oral Take 10 mg by mouth daily.    [name] METFORMIN HCL 500 MG PO TABS Oral Take 500 mg by mouth daily.    . ADULT MULTIVITAMIN W/MINERALS CH Oral Take 1 tablet by  mouth daily.    [name] ROSUVASTATIN CALCIUM 20 MG PO TABS Oral Take 20 mg by mouth daily.    . TROLAMINE SALICYLATE 10 % EX CREA Topical Apply 1 application topically as needed. Applies to legs for aches and pain      BP 120/72  Pulse 82  Temp 98.7 F (37.1 C) (Oral)  Resp 18  SpO2 99%  Physical Exam  Nursing note and vitals reviewed. Constitutional: She appears well-developed and well-nourished. No distress.  HENT:  Head: Normocephalic and atraumatic.  Neck: Neck supple.  Pulmonary/Chest: Effort normal.  Musculoskeletal:       Left wrist: She exhibits tenderness. She exhibits normal range of motion, no swelling, no effusion, no crepitus and no deformity.       Left hip: She exhibits normal range of motion, normal strength, no tenderness, no bony tenderness, no crepitus and no deformity.        Cervical back: She exhibits no bony tenderness.       Thoracic back: She exhibits no bony tenderness.       Lumbar back: She exhibits no bony tenderness.       Arms:      Legs:      Left hip - some pain with abduction.  Left lower extremity: strength 5/5, sensation intact, distal pulses intact.    Left hand with normal grip strength, radial pulse is intact, sensation intact, capillary refill < 2 seconds in all digits.   Neurological: She is alert.  Skin: She is not diaphoretic.    ED Course  Procedures (including critical care time)  Labs Reviewed - No data to display Dg Wrist Complete Left  05/21/2012  *RADIOLOGY REPORT*  Clinical Data: Left wrist pain along the ulnar side.  LEFT WRIST - COMPLETE 3+ VIEW  Comparison: 02/08/2004  Findings: Several ossific structures adjacent to the ulnar styloid noted.  Faint calcifications project over the ulnolunate space on the oblique projection but not the frontal projection, and accordingly may be slightly posterior or anterior to the lunate rather than representing T S C calcifications.  These are not well correlated on the lateral projection.  Spurring along the triquetrum noted.  There is degenerative spurring of the first carpometacarpal articulation.  IMPRESSION:  1.  Faint calcifications projecting just proximal to the carpus on the oblique projection are of uncertain etiology.  These do not appear to project directly over the TFCC as might be encountered and chondrocalcinosis, although there are several secondary ossification centers in the vicinity of the ulnar styloid. These calcifications could represent tiny joint fragments or dorsal or volar ligamentous calcification.  High resolution wrist CT followup may be helpful in further characterization. 2.  Degenerative spurring of the first carpometacarpal articulation.   Original Report Authenticated By: Dellia Cloud, M.D.    Dg Hip Complete Left  05/21/2012  *RADIOLOGY REPORT*  Clinical Data:  61 year old female pain radiating from the posterior left hip to the groin.  LEFT HIP - COMPLETE 2+ VIEW  Comparison: None.  Findings: Femoral heads are normally located.  Joint spaces are relatively preserved.  Mild acetabular spurring.  Numerous pelvic phleboliths.  Pelvis intact.  Sacral ala and SI joints within normal limits.  Proximal left femur intact. Bone mineralization is within normal limits.  IMPRESSION: No acute fracture or dislocation identified about the left hip or pelvis.   Original Report Authenticated By: Ulla Potash III, M.D.      1. Left hip pain   2. Left wrist pain  MDM  Pt with gradual onset left hip and left wrist pain x weeks-months.  No trauma, no e/o infection.  Given patient's age, xrays ordered.  Xrays of wrist show calcifications of unknown etiology or significance.  Hip shows mild acetabular spurring.  Discussed all results with patient.  Pt has seen [facility] in the past for unrelated issue (right joint replacement)- pt d/c home with voltaren gel and ortho follow up.  Pt given return precautions.  Pt verbalizes understanding and agrees with plan.          [place], [place] 05/21/12 1919

## 2012-05-21 NOTE — ED Notes (Signed)
Patient transported to X-ray 

## 2012-05-21 NOTE — ED Notes (Signed)
Pt from home with reports of left lower back pain that radiates to left hip area as well as left wrist pain. Pt denies N/V/D, abdominal pain and dysuria. Pt reports that back pain is worse with lying and sitting as if the joint "locks in".

## 2012-05-22 NOTE — ED Provider Notes (Signed)
Medical screening examination/treatment/procedure(s) were performed by non-physician practitioner and as supervising physician I was immediately available for consultation/collaboration.  Guerry Covington, MD 05/22/12 0030 

## 2012-05-24 ENCOUNTER — Emergency Department (HOSPITAL_COMMUNITY)
Admission: EM | Admit: 2012-05-24 | Discharge: 2012-05-24 | Disposition: A | Discharge status: Home / Self Care | Payer: PRIVATE HEALTH INSURANCE | Source: Home / Self Care | Attending: Emergency Medicine | Admitting: Emergency Medicine

## 2012-05-24 NOTE — ED Notes (Signed)
Clinical staff reported that this is the third call for patient to come back with no respose

## 2012-05-24 NOTE — ED Notes (Signed)
Pt called x 3 no answer 

## 2012-05-24 NOTE — ED Notes (Signed)
Pt called x 1 no answer

## 2012-05-24 NOTE — ED Notes (Signed)
Pt called x2 no answer 

## 2012-07-26 ENCOUNTER — Emergency Department (HOSPITAL_COMMUNITY): Payer: PRIVATE HEALTH INSURANCE

## 2012-07-26 ENCOUNTER — Emergency Department (HOSPITAL_COMMUNITY)
Admission: EM | Admit: 2012-07-26 | Discharge: 2012-07-26 | Disposition: A | Discharge status: Home / Self Care | Payer: PRIVATE HEALTH INSURANCE | Attending: Emergency Medicine | Admitting: Emergency Medicine

## 2012-07-26 ENCOUNTER — Encounter (HOSPITAL_COMMUNITY): Payer: Self-pay | Admitting: Emergency Medicine

## 2012-07-26 DIAGNOSIS — J40 Bronchitis, not specified as acute or chronic: Secondary | ICD-10-CM | POA: Insufficient documentation

## 2012-07-26 DIAGNOSIS — F329 Major depressive disorder, single episode, unspecified: Secondary | ICD-10-CM | POA: Insufficient documentation

## 2012-07-26 DIAGNOSIS — F3289 Other specified depressive episodes: Secondary | ICD-10-CM | POA: Insufficient documentation

## 2012-07-26 DIAGNOSIS — E7800 Pure hypercholesterolemia, unspecified: Secondary | ICD-10-CM | POA: Insufficient documentation

## 2012-07-26 DIAGNOSIS — IMO0001 Reserved for inherently not codable concepts without codable children: Secondary | ICD-10-CM | POA: Insufficient documentation

## 2012-07-26 DIAGNOSIS — Z79899 Other long term (current) drug therapy: Secondary | ICD-10-CM | POA: Insufficient documentation

## 2012-07-26 DIAGNOSIS — Z8669 Personal history of other diseases of the nervous system and sense organs: Secondary | ICD-10-CM | POA: Insufficient documentation

## 2012-07-26 DIAGNOSIS — I10 Essential (primary) hypertension: Secondary | ICD-10-CM | POA: Insufficient documentation

## 2012-07-26 DIAGNOSIS — J029 Acute pharyngitis, unspecified: Secondary | ICD-10-CM | POA: Insufficient documentation

## 2012-07-26 DIAGNOSIS — F411 Generalized anxiety disorder: Secondary | ICD-10-CM | POA: Insufficient documentation

## 2012-07-26 MED ORDER — AZITHROMYCIN 250 MG PO TABS: 250.0000 mg | ORAL_TABLET | Freq: Every day | ORAL | Status: DC

## 2012-07-26 MED ORDER — CETIRIZINE-PSEUDOEPHEDRINE ER 5-120 MG PO TB12: 1.0000 | ORAL_TABLET | Freq: Every day | ORAL | Status: DC

## 2012-07-26 MED ORDER — HYDROCODONE-HOMATROPINE 5-1.5 MG/5ML PO SYRP: 5.0000 mL | ORAL_SOLUTION | Freq: Four times a day (QID) | ORAL | Status: DC | PRN

## 2012-07-26 NOTE — ED Notes (Signed)
Pt c/o cough/congestion x 1 week, unrelieved with OTC meds. Nasal drainage, denies CP, PWD

## 2012-07-26 NOTE — ED Provider Notes (Signed)
History     CSN: [id number]  Arrival date & time 07/26/12  4098   First MD Initiated Contact with Patient 07/26/12 1939      Chief Complaint  Patient presents with  . Cough    (Consider location/radiation/quality/duration/timing/severity/associated sxs/prior treatment) HPI Melissa [name] is a 61 y.o. female who presents with complaint of URI symptoms. States nasal drainage, sore throat, cough for  About a week now. States taking ibuprofen, over the counter cough medication with no relief. States fever at home up to 102. Cough and sore throat worse at night. Reports night sweats. No headache, neck pain, stiffness. No n/v/d, no urianary symptoms.   Past Medical History  Diagnosis Date  . Hypercholesterolemia   . Hypertension   . Glaucoma (increased eye pressure)   . Anxiety   . Depression     Past Surgical History  Procedure Date  . Total knee arthroplasty   . Abdominal hysterectomy     Family History  Problem Relation Age of Onset  . Cancer Maternal Grandmother     ovarian  . Cancer Mother     bone    History  Substance Use Topics  . Smoking status: Never Smoker   . Smokeless tobacco: Never Used  . Alcohol Use: Yes     Comment: occassionally    OB History    Grav Para Term Preterm Abortions TAB SAB Ect Mult Living   0         0      Review of Systems  HENT: Positive for congestion, sore throat, sneezing and sinus pressure. Negative for neck pain and neck stiffness.   Respiratory: Positive for cough.   Musculoskeletal: Positive for myalgias.  Neurological: Negative for dizziness and headaches.  All other systems reviewed and are negative.    Allergies  Codeine  Home Medications   Current Outpatient Rx  Name  Route  Sig  Dispense  Refill  . ALPRAZOLAM 0.25 MG PO TABS   Oral   Take 0.25 mg by mouth at bedtime as needed. anxiety         . IBUPROFEN 200 MG PO TABS   Oral   Take 200 mg by mouth every 6 (six) hours as needed. Pain           . LISINOPRIL 10 MG PO TABS   Oral   Take 10 mg by mouth daily.         [name] METFORMIN HCL 500 MG PO TABS   Oral   Take 500 mg by mouth daily.         [name] ROSUVASTATIN CALCIUM 20 MG PO TABS   Oral   Take 20 mg by mouth daily.         . TROLAMINE SALICYLATE 10 % EX CREA   Topical   Apply 1 application topically as needed. Applies to legs for aches and pain           BP 118/71  Pulse 98  Temp 99.3 F (37.4 C) (Oral)  Resp 18  Ht 5\' 6"  (1.676 m)  Wt 225 lb (102.059 kg)  BMI 36.32 kg/m2  SpO2 96%  Physical Exam  Nursing note and vitals reviewed. Constitutional: She is oriented to person, place, and time. She appears well-developed and well-nourished. No distress.  HENT:  Head: Normocephalic and atraumatic.  Right Ear: External ear normal.  Left Ear: External ear normal.  Nose: Nose normal.  Mouth/Throat: Oropharynx is clear and moist. No oropharyngeal exudate.  TM normal bilaterally  Eyes: Conjunctivae normal are normal.  Neck: Normal range of motion.       No meningismus  Cardiovascular: Normal rate, regular rhythm and normal heart sounds.   Pulmonary/Chest: Effort normal and breath sounds normal. No respiratory distress. She has no wheezes. She has no rales.  Musculoskeletal: She exhibits no edema.  Lymphadenopathy:    She has no cervical adenopathy.  Neurological: She is alert and oriented to person, place, and time.  Skin: Skin is warm and dry.  Psychiatric: She has a normal mood and affect.    ED Course  Procedures (including critical care time)  Labs Reviewed - No data to display Dg Chest 2 View  07/26/2012  *RADIOLOGY REPORT*  Clinical Data:  Cough, congestion and fever.  CHEST - 2 VIEW  Comparison: 01/06/2012  Findings: The heart size and mediastinal contours are within normal limits.  Both lungs are clear.  The visualized skeletal structures are unremarkable.  IMPRESSION: No active disease.   Original Report Authenticated By: Irish Lack, M.D.       1. Bronchitis       MDM  PT with cough, nasal congestion, sore throat, sinus pressure for 1 week. Pt has had no relief with medications at home. Fevers up to 102 at home. Pt is diabetic, hx of HTN. No signs of meningitis or sepsis. CXR negative. VS normal. Will cover with antibiotic for symptoms worsening over a week and fever. Will follow up with PCP in 3-5 days if not improving.   Filed Vitals:   07/26/12 1925  BP: 118/71  Pulse: 98  Temp: 99.3 F (37.4 C)  Resp: [address] A Sanjana Folz, [place] 07/27/12 0057

## 2012-07-26 NOTE — ED Notes (Signed)
Pt reports not feeling well x1 week - experiencing cough, congestion, fatigue, and fever. Pt has been self medicating w/ OTC meds w/o relief.

## 2012-08-07 ENCOUNTER — Ambulatory Visit (INDEPENDENT_AMBULATORY_CARE_PROVIDER_SITE_OTHER): Payer: PRIVATE HEALTH INSURANCE | Admitting: Family Medicine

## 2012-08-07 VITALS — BP 138/88 | HR 77 | Temp 98.2°F | Resp 18 | Ht 66.0 in | Wt 215.0 lb

## 2012-08-07 DIAGNOSIS — J309 Allergic rhinitis, unspecified: Secondary | ICD-10-CM

## 2012-08-07 DIAGNOSIS — R05 Cough: Secondary | ICD-10-CM

## 2012-08-07 DIAGNOSIS — R059 Cough, unspecified: Secondary | ICD-10-CM

## 2012-08-07 MED ORDER — BENZONATATE 100 MG PO CAPS: 100.0000 mg | ORAL_CAPSULE | Freq: Three times a day (TID) | ORAL | Status: DC | PRN

## 2012-08-07 MED ORDER — HYDROCODONE-HOMATROPINE 5-1.5 MG/5ML PO SYRP: 5.0000 mL | ORAL_SOLUTION | Freq: Four times a day (QID) | ORAL | Status: DC | PRN

## 2012-08-07 NOTE — ED Provider Notes (Signed)
Medical screening examination/treatment/procedure(s) were performed by non-physician practitioner and as supervising physician I was immediately available for consultation/collaboration. Devoria Albe, MD, Armando Gang   Ward Givens, MD 08/07/12 [phone]

## 2012-08-07 NOTE — Patient Instructions (Signed)
You can take the tessalon OR mucinex Dm during the day, cough syrup only at night if needed to sleep - be careful combining this with any other sedating medicines. Zyrtec over the counter for allergies.  If any fever - over 100, or other worsening in cough or respiratory symptoms - return to clinic. Return to the clinic or go to the nearest emergency room if any of your symptoms worsen or new symptoms occur.

## 2012-08-07 NOTE — Progress Notes (Signed)
  Subjective:    Patient ID: Melissa [name], female    DOB: [birth year], 61 y.o.   MRN: [id number]  HPI Melissa [name] is a 61 y.o. female Diagnosed with bronchitis 07/26/12.  Treated with Hycodan and Z pak. Improved with medicine - cough breaking up.  Yellow green mucus.  Still having some cough - at night. Low grade fever at night - 99.2. Last dose of azithro 07/31/12.  Less productive cough. Now dry cough.  No known hx of asthma. Dyspnea initially - not now.  Clearing throat. Feeling overall better - still some cough, and upper chest sx's.  Worse with cough. No heartburn. Does have some ar and PND.  Allergies at work? Not taking zyrtec d everyday.   Tx: otc robitussin DM.  Ran out of rx cough syrup last week. Cough sometimes at night.    Review of Systems As above.     Objective:   Physical Exam  Constitutional: She is oriented to person, place, and time. She appears well-developed and well-nourished. No distress.  HENT:  Head: Normocephalic and atraumatic.  Right Ear: Hearing, tympanic membrane, external ear and ear canal normal.  Left Ear: Hearing, tympanic membrane, external ear and ear canal normal.  Nose: Nose normal.  Mouth/Throat: Oropharynx is clear and moist. No oropharyngeal exudate.  Eyes: Conjunctivae normal and EOM are normal. Pupils are equal, round, and reactive to light.  Cardiovascular: Normal rate, regular rhythm, normal heart sounds and intact distal pulses.   No murmur heard. Pulmonary/Chest: Effort normal and breath sounds normal. No respiratory distress. She has no wheezes. She has no rhonchi.  Neurological: She is alert and oriented to person, place, and time.  Skin: Skin is warm and dry. No rash noted.  Psychiatric: She has a normal mood and affect. Her behavior is normal.       Assessment & Plan:  Melissa [name] is a 61 y.o. female   1. Cough  benzonatate (TESSALON) 100 MG capsule, HYDROcodone-homatropine (HYCODAN) 5-1.5 MG/5ML syrup  2.  Allergic rhinitis      Likely post infectious cough - improving slowly, afebrile.  Discussed cbc - declined at present.  Trial of tessalon or mucinex as below., hycodan only if needed, rtc if not improving - sooner if worse.   Patient Instructions  You can take the tessalon OR mucinex Dm during the day, cough syrup only at night if needed to sleep - be careful combining this with any other sedating medicines. Zyrtec over the counter for allergies.  If any fever - over 100, or other worsening in cough or respiratory symptoms - return to clinic. Return to the clinic or go to the nearest emergency room if any of your symptoms worsen or new symptoms occur.

## 2012-09-10 ENCOUNTER — Emergency Department (INDEPENDENT_AMBULATORY_CARE_PROVIDER_SITE_OTHER)
Admission: EM | Admit: 2012-09-10 | Discharge: 2012-09-10 | Disposition: A | Discharge status: Home / Self Care | Payer: PRIVATE HEALTH INSURANCE | Source: Home / Self Care

## 2012-09-10 ENCOUNTER — Encounter (HOSPITAL_COMMUNITY): Payer: Self-pay | Admitting: Emergency Medicine

## 2012-09-10 DIAGNOSIS — E119 Type 2 diabetes mellitus without complications: Secondary | ICD-10-CM

## 2012-09-10 DIAGNOSIS — E785 Hyperlipidemia, unspecified: Secondary | ICD-10-CM

## 2012-09-10 DIAGNOSIS — I10 Essential (primary) hypertension: Secondary | ICD-10-CM

## 2012-09-10 DIAGNOSIS — G4700 Insomnia, unspecified: Secondary | ICD-10-CM

## 2012-09-10 MED ORDER — ALPRAZOLAM 0.25 MG PO TABS: 0.2500 mg | ORAL_TABLET | Freq: Every evening | ORAL | Status: DC | PRN

## 2012-09-10 MED ORDER — METFORMIN HCL 500 MG PO TABS: ORAL_TABLET | ORAL | Status: DC

## 2012-09-10 MED ORDER — ROSUVASTATIN CALCIUM 40 MG PO TABS: ORAL_TABLET | ORAL | Status: DC

## 2012-09-10 MED ORDER — LISINOPRIL 10 MG PO TABS: 10.0000 mg | ORAL_TABLET | Freq: Every day | ORAL | Status: DC

## 2012-09-10 NOTE — ED Notes (Addendum)
Pt is here to have her meds refilled.  Has not been able to find a PCP Needs refill on Metformin 500mg , Lisinopril 10mg , Crestor 20mg , and alprazolam 0.25mg  Denies any medical problems.   She is alert w/no signs of acute distress.

## 2012-09-10 NOTE — ED Provider Notes (Signed)
History     CSN: [id number]  Arrival date & time 09/10/12  1718   None     Chief Complaint  Patient presents with  . Medication Refill    (Consider location/radiation/quality/duration/timing/severity/associated sxs/prior treatment) HPI Comments: 62 year old female presents for a request of her chronic medications refill. States she does not have a PCP but she does have insurance. She has an appointment in approximately one month with a new PCP for evaluation and management of her chronic medical conditions. She has no complaints for today.   Past Medical History  Diagnosis Date  . Hypercholesterolemia   . Hypertension   . Glaucoma (increased eye pressure)   . Anxiety   . Depression     Past Surgical History  Procedure Date  . Total knee arthroplasty   . Abdominal hysterectomy     Family History  Problem Relation Age of Onset  . Cancer Maternal Grandmother     ovarian  . Cancer Mother     bone    History  Substance Use Topics  . Smoking status: Never Smoker   . Smokeless tobacco: Never Used  . Alcohol Use: Yes     Comment: occassionally    OB History    Grav Para Term Preterm Abortions TAB SAB Ect Mult Living   0         0      Review of Systems  All other systems reviewed and are negative.    Allergies  Codeine  Home Medications   Current Outpatient Rx  Name  Route  Sig  Dispense  Refill  . ALPRAZOLAM 0.25 MG PO TABS   Oral   Take 0.25 mg by mouth at bedtime as needed. anxiety         . LISINOPRIL 10 MG PO TABS   Oral   Take 10 mg by mouth daily.         [name] METFORMIN HCL 500 MG PO TABS   Oral   Take 500 mg by mouth daily.         [name] ROSUVASTATIN CALCIUM 20 MG PO TABS   Oral   Take 20 mg by mouth daily.         [name] ALPRAZOLAM 0.25 MG PO TABS   Oral   Take 1 tablet (0.25 mg total) by mouth at bedtime as needed for sleep.   30 tablet   0   . AZITHROMYCIN 250 MG PO TABS   Oral   Take 1 tablet (250 mg total) by mouth daily. Take  first 2 tablets together, then 1 every day until finished.   6 tablet   0   . BENZONATATE 100 MG PO CAPS   Oral   Take 1 capsule (100 mg total) by mouth 3 (three) times daily as needed for cough.   20 capsule   0   . CETIRIZINE-PSEUDOEPHEDRINE ER 5-120 MG PO TB12   Oral   Take 1 tablet by mouth daily.   30 tablet   0   . HYDROCODONE-HOMATROPINE 5-1.5 MG/5ML PO SYRP   Oral   Take 5 mLs by mouth every 6 (six) hours as needed for cough.   120 mL   0   . IBUPROFEN 200 MG PO TABS   Oral   Take 200 mg by mouth every 6 (six) hours as needed. Pain         . LISINOPRIL 10 MG PO TABS   Oral   Take 1 tablet (10 mg total)  by mouth daily.   30 tablet   0   . METFORMIN HCL 500 MG PO TABS      1 tab daily with evening meal   30 tablet   0   . ROSUVASTATIN CALCIUM 40 MG PO TABS      1/2 tablet q hs   30 tablet   0   . TROLAMINE SALICYLATE 10 % EX CREA   Topical   Apply 1 application topically as needed. Applies to legs for aches and pain           BP 148/97  Pulse 82  Temp 99.3 F (37.4 C) (Oral)  Resp 18  SpO2 97%  Physical Exam  Constitutional: She is oriented to person, place, and time. She appears well-developed and well-nourished. No distress.  Eyes: EOM are normal.  Neck: Neck supple.  Pulmonary/Chest: Effort normal.  Neurological: She is alert and oriented to person, place, and time.  Skin: Skin is warm and dry.  Psychiatric: She has a normal mood and affect.    ED Course  Procedures (including critical care time)  Labs Reviewed - No data to display No results found.   1. T2DM (type 2 diabetes mellitus)   2. HTN (hypertension)   3. Insomnia   4. Dyslipidemia       MDM  History 40 mg one half tablet each bedtime Alprazolam 0.25 mg one each bedtime Lisinopril 10 mg daily for blood pressure Metformin 500 mg daily with evening meal Normal. Her PCP that she recently established with him 30 days.        Hayden Rasmussen, NP 09/10/12  [phone]

## 2012-09-11 NOTE — ED Provider Notes (Signed)
Medical screening examination/treatment/procedure(s) were performed by resident physician or non-physician practitioner and as supervising physician I was immediately available for consultation/collaboration.   Schwanda Zima DOUGLAS MD.    Oletha Tolson D Gessica Jawad, MD 09/11/12 2054 

## 2012-09-27 NOTE — ED Notes (Signed)
Called in lisinopril 20 mg one tablet each day.  #15, no refills.  Hayden Rasmussen, NP.  Called to [facility] [phone]

## 2012-10-02 ENCOUNTER — Telehealth (HOSPITAL_COMMUNITY): Payer: Self-pay | Admitting: *Deleted

## 2012-10-02 NOTE — Telephone Encounter (Signed)
Telephoned patient at home # and left message to return call to [facility] 

## 2012-10-07 ENCOUNTER — Other Ambulatory Visit (HOSPITAL_COMMUNITY): Payer: Self-pay | Admitting: Obstetrics & Gynecology

## 2012-10-07 DIAGNOSIS — Z1231 Encounter for screening mammogram for malignant neoplasm of breast: Secondary | ICD-10-CM

## 2012-10-09 ENCOUNTER — Ambulatory Visit (INDEPENDENT_AMBULATORY_CARE_PROVIDER_SITE_OTHER): Payer: PRIVATE HEALTH INSURANCE | Admitting: Family Medicine

## 2012-10-09 ENCOUNTER — Other Ambulatory Visit: Payer: Self-pay | Admitting: Family Medicine

## 2012-10-09 VITALS — BP 130/90 | HR 82 | Temp 98.2°F | Resp 16 | Ht 66.0 in | Wt 211.0 lb

## 2012-10-09 DIAGNOSIS — F411 Generalized anxiety disorder: Secondary | ICD-10-CM

## 2012-10-09 DIAGNOSIS — F419 Anxiety disorder, unspecified: Secondary | ICD-10-CM

## 2012-10-09 DIAGNOSIS — R350 Frequency of micturition: Secondary | ICD-10-CM

## 2012-10-09 DIAGNOSIS — E119 Type 2 diabetes mellitus without complications: Secondary | ICD-10-CM

## 2012-10-09 DIAGNOSIS — I10 Essential (primary) hypertension: Secondary | ICD-10-CM

## 2012-10-09 DIAGNOSIS — E785 Hyperlipidemia, unspecified: Secondary | ICD-10-CM

## 2012-10-09 DIAGNOSIS — N390 Urinary tract infection, site not specified: Secondary | ICD-10-CM

## 2012-10-09 LAB — POCT URINALYSIS DIPSTICK
Bilirubin, UA: NEGATIVE
Blood, UA: NEGATIVE
Glucose, UA: NEGATIVE
Nitrite, UA: NEGATIVE
Protein, UA: 30
Spec Grav, UA: 1.025
Urobilinogen, UA: 0.2
pH, UA: 6

## 2012-10-09 LAB — GLUCOSE, POCT (MANUAL RESULT ENTRY): POC Glucose: 94 mg/dl (ref 70–99)

## 2012-10-09 LAB — POCT UA - MICROSCOPIC ONLY
Crystals, Ur, HPF, POC: NEGATIVE
Mucus, UA: NEGATIVE
Yeast, UA: NEGATIVE

## 2012-10-09 LAB — POCT GLYCOSYLATED HEMOGLOBIN (HGB A1C): Hemoglobin A1C: 5.9

## 2012-10-09 MED ORDER — ROSUVASTATIN CALCIUM 40 MG PO TABS: ORAL_TABLET | ORAL | Status: DC

## 2012-10-09 MED ORDER — NITROFURANTOIN MONOHYD MACRO 100 MG PO CAPS: 100.0000 mg | ORAL_CAPSULE | Freq: Two times a day (BID) | ORAL | Status: DC

## 2012-10-09 MED ORDER — LISINOPRIL 20 MG PO TABS: 20.0000 mg | ORAL_TABLET | Freq: Every day | ORAL | Status: DC

## 2012-10-09 MED ORDER — ALPRAZOLAM 0.25 MG PO TABS: 0.2500 mg | ORAL_TABLET | Freq: Every evening | ORAL | Status: DC | PRN

## 2012-10-09 MED ORDER — METFORMIN HCL 500 MG PO TABS: 250.0000 mg | ORAL_TABLET | Freq: Every day | ORAL | Status: DC

## 2012-10-09 NOTE — Patient Instructions (Signed)
Your should receive a call or letter about your lab results within the next week to 10 days.  Keep follow up with primary care provider or can arrange primary care here if you would like - call our main number to schedule appointment if you would like.  Your urine test indicated an infection - take antibiotic for next 1 week. Return to the clinic or go to the nearest emergency room if any of your symptoms worsen or new symptoms occur. Your diabetes is very well controlled.  Can decrease metformin to 1/2 pill every day, and may be able to control with diet - but this can be discussed with your primary care provider.

## 2012-10-09 NOTE — Progress Notes (Signed)
Subjective:    Patient ID: Melissa [name], female    DOB: [birth year], 62 y.o.   MRN: [id number]  HPI Jamisen Carmon Ginsberg Noyce is a 61 y.o. female No current primary care provider. Seen in urgent care 09/10/12 for med refills. Plan to follow up with primary care provider. appt scheduled with a doctor in [place], [place] - March 26th appt, but lives in [place]. - may be looking for closer provider. No regular primary care since May of last year.   DM2 - taking metformin 500mg  qd.  Hasn't been checking cbg's regularly.  Dizzy and high blood sugars few weeks ago.  Recent blood sugars 89-90's. Taking either 1/2 or whole pill of metformin - takes 1/2 if eating well.  Lowest of 70. No symptomatic lows.  HTn - on lisinopril 20mg  qd recently - past month. (prior on 10mg  QD).  Home bp's 140/82.   Hyperlipidemia - last ate 8 and 1/2 hours.  Anxiety - takes xanax 0.25 - once per night lately. More stressed recently. No suicide thoughts, not feeling depressed recently. belongs to church and spiritual help seems to improve.     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    Review of Systems  Constitutional: Negative for fatigue and unexpected weight change.  Respiratory: Negative for chest tightness and shortness of breath.   Cardiovascular: Negative for chest pain, palpitations and leg swelling.  Gastrointestinal: Negative for abdominal pain and blood in stool.  Genitourinary: Positive for difficulty urinating (few times past few months - frequency, and occasional incontinence - can't hold long enough - about 2 times per week.  may be trying to hold too long. ).  Neurological: Negative for dizziness, syncope, light-headedness and headaches.      Objective:   Physical Exam  Vitals reviewed. Constitutional: She is oriented to person, place, and time. She appears well-developed and well-nourished.  HENT:  Head: Normocephalic and atraumatic.   Mouth/Throat: Oropharynx is clear and moist.  Eyes: Conjunctivae normal and EOM are normal. Pupils are equal, round, and reactive to light.  Neck: Normal range of motion. No thyromegaly present.  Cardiovascular: Normal rate, regular rhythm, normal heart sounds and intact distal pulses.   No murmur heard. Pulmonary/Chest: Effort normal. She has no wheezes. She has no rales.  Abdominal: Soft. Normal appearance. She exhibits no abdominal bruit and no pulsatile midline mass. There is no tenderness.  Neurological: She is alert and oriented to person, place, and time.       Microfilament testing WNL bilat feet  Skin: Skin is warm and dry.  Psychiatric: She has a normal mood and affect. Her behavior is normal.   Results for orders placed in visit on 10/09/12  POCT URINALYSIS DIPSTICK      Component Value Range   Color, UA yellow     Clarity, UA cloudy     Glucose, UA neg     Bilirubin, UA neg     Ketones, UA trace     Spec Grav, UA 1.025     Blood, UA neg     pH, UA 6.0     Protein, UA 30     Urobilinogen, UA 0.2     Nitrite, UA neg     Leukocytes, UA moderate (2+)    POCT UA - MICROSCOPIC ONLY      Component Value Range   WBC, Ur, HPF, POC tntc     RBC, urine, microscopic 0-2     Bacteria,  U Microscopic trace     Mucus, UA neg     Epithelial cells, urine per micros 5-10     Crystals, Ur, HPF, POC neg     Casts, Ur, LPF, POC renal tubular     Yeast, UA neg    POCT GLYCOSYLATED HEMOGLOBIN (HGB A1C)      Component Value Range   Hemoglobin A1C 5.9    GLUCOSE, POCT (MANUAL RESULT ENTRY)      Component Value Range   POC Glucose 94  70 - 99 mg/dl        Assessment & Plan:  Kassidie LATESHA CHESNEY is a 62 y.o. female 1. Urinary frequency  tx of UTI as below, with further discussion of incontinence sx's if persist after treatment of uti. POCT urinalysis dipstick, POCT UA - Microscopic Only  2. Hypertension  POCT urinalysis dipstick, POCT UA - Microscopic Only, Lipid panel, rosuvastatin  (CRESTOR) 40 MG tablet, DISCONTINUED: lisinopril (PRINIVIL,ZESTRIL) 20 MG tablet. Continue lisinopril at 20mg  qd check cmp  3. Diabetes  Comprehensive metabolic panel, Lipid panel, POCT glycosylated hemoglobin (Hb A1C), POCT glucose (manual entry), metFORMIN (GLUCOPHAGE) 500 MG tablet. Well controlled, will decrease dose metformin - see instructions.   4. UTI (lower urinary tract infection)  Urine culture, nitrofurantoin, macrocrystal-monohydrate, (MACROBID) 100 MG capsule  5. Anxiety  ALPRAZolam (XANAX) 0.25 MG tablet refilled, but discussed need to rtc, or discuss with pcp to determine if counseling or daily med/SSRi needed as nightly use of med now.   6. Hyperlipidemia  check CMP, lipid panel. .   Meds ordered this encounter  Medications  . DISCONTD: lisinopril (PRINIVIL,ZESTRIL) 10 MG tablet    Sig: Take 20 mg by mouth daily.  [name] ALPRAZolam (XANAX) 0.25 MG tablet    Sig: Take 1 tablet (0.25 mg total) by mouth at bedtime as needed for sleep.    Dispense:  30 tablet    Refill:  0    Order Specific Question:  Supervising Provider    Answer:  Linna Hoff [phone]  . DISCONTD: lisinopril (PRINIVIL,ZESTRIL) 20 MG tablet    Sig: Take 1 tablet (20 mg total) by mouth daily.    Dispense:  30 tablet    Refill:  1  . metFORMIN (GLUCOPHAGE) 500 MG tablet    Sig: Take 0.5 tablets (250 mg total) by mouth daily.    Dispense:  30 tablet    Refill:  1  . rosuvastatin (CRESTOR) 40 MG tablet    Sig: 1/2 tablet q hs    Dispense:  30 tablet    Refill:  0    Order Specific Question:  Supervising Provider    Answer:  Linna Hoff [phone]  . nitrofurantoin, macrocrystal-monohydrate, (MACROBID) 100 MG capsule    Sig: Take 1 capsule (100 mg total) by mouth 2 (two) times daily.    Dispense:  14 capsule    Refill:  0   Patient Instructions  Your should receive a call or letter about your lab results within the next week to 10 days.  Keep follow up with primary care provider or can arrange primary care  here if you would like - call our main number to schedule appointment if you would like.  Your urine test indicated an infection - take antibiotic for next 1 week. Return to the clinic or go to the nearest emergency room if any of your symptoms worsen or new symptoms occur. Your diabetes is very well controlled.  Can decrease metformin to 1/2 pill  every day, and may be able to control with diet - but this can be discussed with your primary care provider.

## 2012-10-10 LAB — COMPREHENSIVE METABOLIC PANEL
ALT: 16 U/L (ref 0–35)
AST: 19 U/L (ref 0–37)
Albumin: 4.3 g/dL (ref 3.5–5.2)
Alkaline Phosphatase: 96 U/L (ref 39–117)
BUN: 14 mg/dL (ref 6–23)
CO2: 26 mEq/L (ref 19–32)
Calcium: 9.9 mg/dL (ref 8.4–10.5)
Chloride: 103 mEq/L (ref 96–112)
Creat: 0.7 mg/dL (ref 0.50–1.10)
Glucose, Bld: 106 mg/dL — ABNORMAL HIGH (ref 70–99)
Potassium: 4.2 mEq/L (ref 3.5–5.3)
Sodium: 139 mEq/L (ref 135–145)
Total Bilirubin: 0.2 mg/dL — ABNORMAL LOW (ref 0.3–1.2)
Total Protein: 7.2 g/dL (ref 6.0–8.3)

## 2012-10-10 LAB — LIPID PANEL
Cholesterol: 194 mg/dL (ref 0–200)
HDL: 64 mg/dL (ref >39)
LDL Cholesterol: 104 mg/dL — ABNORMAL HIGH (ref 0–99)
Total CHOL/HDL Ratio: 3 Ratio
Triglycerides: 131 mg/dL (ref <150)
VLDL: 26 mg/dL (ref 0–40)

## 2012-10-12 LAB — URINE CULTURE: Colony Count: >=100000

## 2012-10-24 ENCOUNTER — Ambulatory Visit (HOSPITAL_COMMUNITY): Payer: PRIVATE HEALTH INSURANCE

## 2012-10-25 DIAGNOSIS — H43819 Vitreous degeneration, unspecified eye: Secondary | ICD-10-CM

## 2012-10-25 HISTORY — DX: Vitreous degeneration, unspecified eye: H43.819

## 2012-11-13 ENCOUNTER — Encounter: Payer: Self-pay | Admitting: Family Medicine

## 2012-11-13 ENCOUNTER — Ambulatory Visit (INDEPENDENT_AMBULATORY_CARE_PROVIDER_SITE_OTHER): Payer: PRIVATE HEALTH INSURANCE | Admitting: Family Medicine

## 2012-11-13 VITALS — BP 148/98 | HR 68 | Temp 98.1°F | Ht 66.0 in | Wt 215.0 lb

## 2012-11-13 DIAGNOSIS — H409 Unspecified glaucoma: Secondary | ICD-10-CM | POA: Insufficient documentation

## 2012-11-13 DIAGNOSIS — I10 Essential (primary) hypertension: Secondary | ICD-10-CM

## 2012-11-13 DIAGNOSIS — F419 Anxiety disorder, unspecified: Secondary | ICD-10-CM | POA: Insufficient documentation

## 2012-11-13 DIAGNOSIS — E66812 Morbid (severe) obesity due to excess calories: Secondary | ICD-10-CM

## 2012-11-13 DIAGNOSIS — F418 Other specified anxiety disorders: Secondary | ICD-10-CM

## 2012-11-13 DIAGNOSIS — F341 Dysthymic disorder: Secondary | ICD-10-CM

## 2012-11-13 DIAGNOSIS — F32A Depression, unspecified: Secondary | ICD-10-CM | POA: Insufficient documentation

## 2012-11-13 DIAGNOSIS — E119 Type 2 diabetes mellitus without complications: Secondary | ICD-10-CM

## 2012-11-13 DIAGNOSIS — N644 Mastodynia: Secondary | ICD-10-CM

## 2012-11-13 DIAGNOSIS — E669 Obesity, unspecified: Secondary | ICD-10-CM

## 2012-11-13 DIAGNOSIS — E7800 Pure hypercholesterolemia, unspecified: Secondary | ICD-10-CM

## 2012-11-13 DIAGNOSIS — F339 Major depressive disorder, recurrent, unspecified: Secondary | ICD-10-CM | POA: Insufficient documentation

## 2012-11-13 HISTORY — DX: Morbid (severe) obesity due to excess calories: E66.01

## 2012-11-13 HISTORY — DX: Morbid (severe) obesity due to excess calories: E66.812

## 2012-11-13 MED ORDER — LISINOPRIL 20 MG PO TABS: ORAL_TABLET | ORAL | Status: DC

## 2012-11-13 MED ORDER — ALPRAZOLAM 0.25 MG PO TABS: 0.2500 mg | ORAL_TABLET | Freq: Every evening | ORAL | Status: DC | PRN

## 2012-11-13 MED ORDER — METFORMIN HCL 500 MG PO TABS: 250.0000 mg | ORAL_TABLET | Freq: Every day | ORAL | Status: DC

## 2012-11-13 MED ORDER — ROSUVASTATIN CALCIUM 40 MG PO TABS: ORAL_TABLET | ORAL | Status: DC

## 2012-11-13 NOTE — Patient Instructions (Addendum)
Start 81mg  enteric coated aspirin daily. I've refilled meds. Return in 2-3 months for physical as you're due, return prior fasting for blood work. Pass by Marion's office for referral for mammogram. Work on healthy diet choices (more fruits/vegetables, prepare snacks and meals to bring to work, etc) and increased walking for goal of weight loss.

## 2012-11-13 NOTE — Progress Notes (Addendum)
Subjective:    Patient ID: Melissa [name], female    DOB: 09/14/1950, 62 y.o.   MRN: [id number]  HPI CC: new pt to establish  L breast sharp pain - mammogram 10/2011 WNL.  Sharp pain going on for last 3-4 months.  No nipple discharge, skin change, lumps or palpable masses that she has noticed.  Did injure left breast 7 yrs ago, but no pain until last few months.  Anxiety and depression - unemployed for 2 years.  Restarted working 12/2011.  Dx 10 yrs ago.  Takes alprazolam prn bedtime.  Prior on lexapro which helped, but doesn't like taking daily medication for this.  Caused some giddiness.  Stressful eating.  Doesn't feel need to take daily medication currently  Obesity - Body mass index is 34.72 kg/(m^2).   Wt Readings from Last 3 Encounters:  11/13/12 215 lb (97.523 kg)  10/09/12 211 lb (95.709 kg)  08/07/12 215 lb (97.523 kg)    HTN - dx over 20 yrs ago, compliant with lisinopril 20mg  daily.   HLD - on crestor 40mg  1/2 pill daily.  Glaucoma - prior on drops, no longer.  Sees Dr. Lottie Dawson ophtho in [place] regularly.  DM - borderline per pt. Does check sugars intermittently.  On 1/2 metformin daily.  Foot exam done at recent [facility] visit. 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 Tetanus 2012 Mammo 2013 OBGYN Dr. Seymour Bars   Medications and allergies reviewed and updated in chart.  Past histories reviewed and updated if relevant as below. Patient Active Problem List  Diagnosis  . Chest pain  . HTN (hypertension)  . GERD (gastroesophageal reflux disease)  . Hypercholesteremia   Past Medical History  Diagnosis Date  . HLD (hyperlipidemia)   . Hypertension   . Glaucoma   . Anxiety   . Arthritis     s/p R TKR  .  Depression   . Diabetes 2011    borderline  . History of ulcer disease    Past Surgical History  Procedure Laterality Date  . Total knee arthroplasty  2004    right TKR (Dr. Renae Fickle with [facility])  . Abdominal hysterectomy  2007    fibroids, heavy bleeding   History  Substance Use Topics  . Smoking status: Never Smoker   . Smokeless tobacco: Never Used  . Alcohol Use: No   Family History  Problem Relation Age of Onset  . Cancer Maternal Grandmother 68    ovarian  . Cancer Mother 68    bone, MM  . Diabetes Maternal Grandmother   . Hypertension Maternal Grandmother   . Stroke Other     unsure who  . CAD Neg Hx    Allergies  Allergen Reactions  . Codeine Other (See Comments)    palpitations   Current Outpatient Prescriptions on File Prior to Visit  Medication Sig Dispense Refill  . ALPRAZolam (XANAX) 0.25 MG tablet Take 1 tablet (0.25 mg total) by mouth at bedtime as needed for sleep.  30 tablet  0  . ibuprofen (ADVIL,MOTRIN) 200 MG tablet Take 200 mg by mouth every 6 (six) hours as needed. Pain      . lisinopril (PRINIVIL,ZESTRIL) 20 MG tablet TAKE 1 TABLET BY MOUTH DAILY  90 tablet  0  .  metFORMIN (GLUCOPHAGE) 500 MG tablet Take 0.5 tablets (250 mg total) by mouth daily.  30 tablet  1  . rosuvastatin (CRESTOR) 40 MG tablet 1/2 tablet q hs  30 tablet  0  . trolamine salicylate (ASPERCREME) 10 % cream Apply 1 application topically as needed. Applies to legs for aches and pain       No current facility-administered medications on file prior to visit.     Review of Systems  Constitutional: Negative for fever, chills, activity change, appetite change, fatigue and unexpected weight change.  HENT: Negative for hearing loss and neck pain.   Eyes: Positive for visual disturbance (recent floaters).  Respiratory: Positive for chest tightness (attributes to stress). Negative for cough, shortness of breath and wheezing.   Cardiovascular: Positive for leg swelling. Negative  for chest pain and palpitations.  Gastrointestinal: Negative for nausea, vomiting, abdominal pain, diarrhea, constipation, blood in stool and abdominal distention.  Genitourinary: Negative for hematuria and difficulty urinating.  Musculoskeletal: Negative for myalgias and arthralgias.  Skin: Negative for rash.  Neurological: Positive for dizziness (intermittent mild). Negative for seizures, syncope and headaches.  Hematological: Does not bruise/bleed easily.  Psychiatric/Behavioral: Positive for dysphoric mood. The patient is nervous/anxious.        Objective:   Physical Exam  Nursing note and vitals reviewed. Constitutional: She is oriented to person, place, and time. She appears well-developed and well-nourished. No distress.  HENT:  Head: Normocephalic and atraumatic.  Right Ear: Hearing, tympanic membrane, external ear and ear canal normal.  Left Ear: Hearing, tympanic membrane, external ear and ear canal normal.  Nose: Nose normal.  Mouth/Throat: Oropharynx is clear and moist. No oropharyngeal exudate.  Eyes: Conjunctivae and EOM are normal. Pupils are equal, round, and reactive to light. No scleral icterus.  Neck: Normal range of motion. Neck supple. No thyromegaly present.  Cardiovascular: Normal rate, regular rhythm, normal heart sounds and intact distal pulses.   No murmur heard. Pulses:      Radial pulses are 2+ on the right side, and 2+ on the left side.  Pulmonary/Chest: Effort normal and breath sounds normal. No respiratory distress. She has no wheezes. She has no rales. Right breast exhibits tenderness (+). Right breast exhibits no inverted nipple, no mass, no nipple discharge and no skin change. Left breast exhibits tenderness (++ inferior breast). Left breast exhibits no inverted nipple, no mass, no nipple discharge and no skin change. Breasts are symmetrical.  Some nodularity bilateral inferior breasts, symmetrical  Abdominal: Soft. Bowel sounds are normal. She exhibits  no distension and no mass. There is no tenderness. There is no rebound and no guarding.  Musculoskeletal: Normal range of motion. She exhibits no edema.  Lymphadenopathy:    She has no cervical adenopathy.    She has no axillary adenopathy.       Right axillary: No lateral adenopathy present.       Left axillary: No lateral adenopathy present.      Right: No supraclavicular adenopathy present.       Left: No supraclavicular adenopathy present.  Neurological: She is alert and oriented to person, place, and time.  CN grossly intact, station and gait intact  Skin: Skin is warm and dry. No rash noted.  Psychiatric: She has a normal mood and affect. Her behavior is normal. Judgment and thought content normal.       Assessment & Plan:

## 2012-11-13 NOTE — Assessment & Plan Note (Signed)
Body mass index is 34.72 kg/(m^2).  Reviewed weight as well as discussed plan for weight loss - including increased regular activity (pt thinks could restart walking with good weather) and monitoring diet (decreased sugars/carbs, increased fruits/vegetables, bringing prepared meals/snacks to work). Pt motivated for weight loss Wt Readings from Last 3 Encounters:  11/13/12 215 lb (97.523 kg)  10/09/12 211 lb (95.709 kg)  08/07/12 215 lb (97.523 kg)

## 2012-11-13 NOTE — Assessment & Plan Note (Signed)
Due for screening, however given recent left pain I have ordered bilateral diagnostic mammograms to be done at the breast center.

## 2012-11-13 NOTE — Assessment & Plan Note (Signed)
Chronic, elevated today. Will recheck at physical in 2-3 months. If persistently elevated, add medication (likely combo thiazide).

## 2012-11-13 NOTE — Assessment & Plan Note (Addendum)
Will continue to monitor for now. Refilled alprazolam (which pt uses primarily for sleep)

## 2012-11-13 NOTE — Assessment & Plan Note (Signed)
Chronic, continue crestor 1/2 pill daily (20mg  total). Stable FLP 10/2012 with LDL 104.

## 2012-11-13 NOTE — Assessment & Plan Note (Signed)
Last A1c great - recently decreased metformin. Recheck next visit, if remaining well controlled, consider coming off metformin. See below for plan to address obesity

## 2012-11-27 ENCOUNTER — Ambulatory Visit: Payer: PRIVATE HEALTH INSURANCE | Admitting: Family Medicine

## 2012-11-28 ENCOUNTER — Ambulatory Visit
Admission: RE | Admit: 2012-11-28 | Discharge: 2012-11-28 | Disposition: A | Discharge status: Home / Self Care | Payer: PRIVATE HEALTH INSURANCE | Source: Ambulatory Visit | Attending: Family Medicine | Admitting: Family Medicine

## 2012-11-28 DIAGNOSIS — N644 Mastodynia: Secondary | ICD-10-CM

## 2012-11-29 ENCOUNTER — Encounter: Payer: Self-pay | Admitting: *Deleted

## 2012-12-02 ENCOUNTER — Other Ambulatory Visit: Payer: Self-pay | Admitting: Family Medicine

## 2012-12-02 ENCOUNTER — Ambulatory Visit (INDEPENDENT_AMBULATORY_CARE_PROVIDER_SITE_OTHER): Payer: PRIVATE HEALTH INSURANCE | Admitting: Family Medicine

## 2012-12-02 VITALS — BP 136/90 | HR 78 | Temp 98.0°F | Resp 18 | Ht 65.5 in | Wt 219.4 lb

## 2012-12-02 DIAGNOSIS — M5412 Radiculopathy, cervical region: Secondary | ICD-10-CM

## 2012-12-02 MED ORDER — PREDNISONE 20 MG PO TABS: ORAL_TABLET | ORAL | Status: DC

## 2012-12-02 MED ORDER — CYCLOBENZAPRINE HCL 10 MG PO TABS: 10.0000 mg | ORAL_TABLET | Freq: Every day | ORAL | Status: DC

## 2012-12-02 NOTE — Telephone Encounter (Signed)
Rx called to pharmacy

## 2012-12-02 NOTE — Telephone Encounter (Signed)
Early request. Ok to phone in.

## 2012-12-02 NOTE — Telephone Encounter (Signed)
Received refill request electronically. Last office visit 11/13/12. Is it okay to refill medication? 

## 2012-12-02 NOTE — Progress Notes (Signed)
x

## 2012-12-02 NOTE — Progress Notes (Signed)
  Subjective:    Patient ID: Melissa [name], female    DOB: [birth year], 62 y.o.   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 Patient comes in with pain in her left shoulder, neck, radiating down her left deltoid. She complains of pain in her shoulder, wrists, and knees for about a month. She was diagnosed with Osteoarthritis. She had a total knee replacement in 2004. . She does not want to have any time off of work for surgery. She complains that she also has DJD also diagnosed in 2004. Her usual dr is with [facility]. She has been treating with ibuprofen but it has stopped providing relief.   She would like a referral to a specialist as Dr. Doristine Section at [facility] has retired. She has not been to see an orthopedist since.   Review of Systems No weakness in the left arm, no chest pain, shortness of breath    Objective:   Physical Exam Alert, good eye contact, no acute distress Full range of motion both active and passive and against resistance of upper arms, forearms, hands with grips and finger movement No numbness in the left arm Reflexes normal biceps and triceps No muscle atrophy Neck range of motion normal    Assessment & Plan:  Cervical radiculopathy, mild  Treat with prednisone 40 mg daily x5 and Flexeril 10 mg each bedtime Cervical radiculitis

## 2013-01-02 LAB — HM DIABETES EYE EXAM

## 2013-01-17 ENCOUNTER — Other Ambulatory Visit: Payer: Self-pay | Admitting: Family Medicine

## 2013-01-19 NOTE — Telephone Encounter (Signed)
plz phone in. 

## 2013-01-20 NOTE — Telephone Encounter (Signed)
Rx called in as directed.   

## 2013-01-28 ENCOUNTER — Encounter: Payer: Self-pay | Admitting: Family Medicine

## 2013-02-11 ENCOUNTER — Encounter: Payer: Self-pay | Admitting: Podiatry

## 2013-02-11 ENCOUNTER — Ambulatory Visit (INDEPENDENT_AMBULATORY_CARE_PROVIDER_SITE_OTHER): Payer: PRIVATE HEALTH INSURANCE | Admitting: Podiatry

## 2013-02-11 VITALS — BP 147/93 | HR 82

## 2013-02-11 DIAGNOSIS — M7750 Other enthesopathy of unspecified foot: Secondary | ICD-10-CM

## 2013-02-11 DIAGNOSIS — M2040 Other hammer toe(s) (acquired), unspecified foot: Secondary | ICD-10-CM | POA: Insufficient documentation

## 2013-02-11 DIAGNOSIS — M7742 Metatarsalgia, left foot: Secondary | ICD-10-CM

## 2013-02-11 DIAGNOSIS — M7741 Metatarsalgia, right foot: Secondary | ICD-10-CM | POA: Insufficient documentation

## 2013-02-11 NOTE — Progress Notes (Signed)
Subjective: 62 year old female presents complaining of pain on both feet under 2nd toe joint.  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  Had done bilateral hammer toe surgeries in 2004 at [facility]. Patient points under the 2nd MPJ area being painful area. Also complained of 2nd and 3rd toes closely stuck together bilateral.   Objective: Orthopedic:  Status post hammer toe surgery 2-4 bilateral in 2004. Hypermobile first ray bilateral. Palpable enlarged 2nd and 3rd MPJ bilateral with pain upon pressure. Vascular:  Pedal pulses are palpable.  No visible edema or erythema noted. Neurologic: All epicritic and tactile sensations grossly intact.  Dermatologic: Normotrophic skin without abnormal findings. Radiographic examination: Significantly short first Metatarsal bone and relatively long 2nd and 3rd Metatarsal bone bilateral.  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  Plan: May benefit from Accommodated Orthotic shoe insert and/or Shortening 2nd and 3rd Metatarsal osteotomy or lengthening of the 1st Metatarsal. Reviewed all clinical findings.

## 2013-02-24 ENCOUNTER — Other Ambulatory Visit: Payer: Self-pay | Admitting: Family Medicine

## 2013-02-24 NOTE — Telephone Encounter (Signed)
plz phone in. 

## 2013-02-24 NOTE — Telephone Encounter (Signed)
Received refill request electronically. Last office visit 11/13/12. Is it okay to refill medication?

## 2013-02-25 NOTE — Telephone Encounter (Signed)
Phoned in to [facility] pharmacy. 

## 2013-03-13 ENCOUNTER — Other Ambulatory Visit: Payer: Self-pay

## 2013-04-22 ENCOUNTER — Telehealth: Payer: Self-pay

## 2013-04-22 ENCOUNTER — Other Ambulatory Visit: Payer: Self-pay | Admitting: *Deleted

## 2013-04-22 DIAGNOSIS — E119 Type 2 diabetes mellitus without complications: Secondary | ICD-10-CM

## 2013-04-22 MED ORDER — METFORMIN HCL 500 MG PO TABS: 250.0000 mg | ORAL_TABLET | Freq: Every day | ORAL | Status: DC

## 2013-04-22 NOTE — Telephone Encounter (Signed)
Pt request refill metformin to [facility]; [facility] [address] for transfer of metformin refill. Pt voiced understanding.

## 2013-04-24 ENCOUNTER — Other Ambulatory Visit: Payer: Self-pay | Admitting: Family Medicine

## 2013-04-24 NOTE — Telephone Encounter (Signed)
plz phone in. 

## 2013-04-24 NOTE — Telephone Encounter (Signed)
Rx called in as prescribed 

## 2013-05-06 ENCOUNTER — Encounter: Payer: Self-pay | Admitting: Radiology

## 2013-05-07 ENCOUNTER — Ambulatory Visit: Payer: PRIVATE HEALTH INSURANCE | Admitting: Family Medicine

## 2013-05-12 ENCOUNTER — Encounter: Payer: Self-pay | Admitting: Radiology

## 2013-05-13 ENCOUNTER — Encounter: Payer: Self-pay | Admitting: Family Medicine

## 2013-05-13 ENCOUNTER — Ambulatory Visit (INDEPENDENT_AMBULATORY_CARE_PROVIDER_SITE_OTHER): Payer: PRIVATE HEALTH INSURANCE | Admitting: Family Medicine

## 2013-05-13 VITALS — BP 124/86 | HR 88 | Temp 98.5°F | Wt 225.0 lb

## 2013-05-13 DIAGNOSIS — R350 Frequency of micturition: Secondary | ICD-10-CM

## 2013-05-13 DIAGNOSIS — K589 Irritable bowel syndrome without diarrhea: Secondary | ICD-10-CM

## 2013-05-13 DIAGNOSIS — E7800 Pure hypercholesterolemia, unspecified: Secondary | ICD-10-CM

## 2013-05-13 DIAGNOSIS — I10 Essential (primary) hypertension: Secondary | ICD-10-CM

## 2013-05-13 DIAGNOSIS — Z23 Encounter for immunization: Secondary | ICD-10-CM

## 2013-05-13 DIAGNOSIS — E119 Type 2 diabetes mellitus without complications: Secondary | ICD-10-CM

## 2013-05-13 LAB — POCT URINALYSIS DIPSTICK
Bilirubin, UA: NEGATIVE
Blood, UA: NEGATIVE
Glucose, UA: NEGATIVE
Ketones, UA: NEGATIVE
Nitrite, UA: NEGATIVE
Protein, UA: NEGATIVE
Spec Grav, UA: >=1.03
Urobilinogen, UA: 0.2
pH, UA: 6

## 2013-05-13 MED ORDER — ALPRAZOLAM 0.25 MG PO TABS: ORAL_TABLET | ORAL | Status: DC

## 2013-05-13 MED ORDER — ROSUVASTATIN CALCIUM 40 MG PO TABS: ORAL_TABLET | ORAL | Status: DC

## 2013-05-13 MED ORDER — LUBIPROSTONE 8 MCG PO CAPS: 8.0000 ug | ORAL_CAPSULE | Freq: Two times a day (BID) | ORAL | Status: DC

## 2013-05-13 MED ORDER — LISINOPRIL 20 MG PO TABS: ORAL_TABLET | ORAL | Status: DC

## 2013-05-13 NOTE — Addendum Note (Signed)
Addended by: Josph Macho A on: 05/13/2013 03:42 PM   Modules accepted: Orders

## 2013-05-13 NOTE — Assessment & Plan Note (Signed)
Continue metformin 1/2 tablet daily Check A1c - if well controlled, will do trial off med (although anticipate elevated given recent ESI).

## 2013-05-13 NOTE — Assessment & Plan Note (Signed)
Chronic, stable. Continue med. 

## 2013-05-13 NOTE — Progress Notes (Addendum)
  Subjective:    Patient ID: Melissa [name], female    DOB: July 04, 1951, 62 y.o.   MRN: [id number]  HPI CC: med refill  DM - checks sugars about once daily 80-90s.  Last eye exam was 4 mo ago.  Some high sugars after recent ESI, but now getting back to normal range.  No paresthesias.  Sees podiatrist. Lab Results  Component Value Date   HGBA1C 5.9 10/09/2012     HTN - compliant with lisinopril.  Denies CP/tightness, SOB.  Occasional HA, leg swelling  HLD - compliant with crestor.  No myalgias.  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  Working on diet - increased fiber.  H/o glaucoma - followed regularly by ophtho.  Recently back gave out - s/p ESI pending PT. Urinary frequency that has worsened over last few weeks - requests UA.  Denies dysuria or other UTI sxs.  Past Medical History  Diagnosis Date  . HLD (hyperlipidemia)   . Hypertension   . Glaucoma   . Depression with anxiety   . Arthritis     s/p R TKR  . Diabetes type 2, controlled 2011    borderline  . History of ulcer disease     Past Surgical History  Procedure Laterality Date  . Total knee arthroplasty  2004    right TKR (Dr. Renae Fickle with [facility])  . Abdominal hysterectomy  2007    fibroids, heavy bleeding    Review of Systems Per HPI    Objective:   Physical Exam  Nursing note and vitals reviewed. Constitutional: She appears well-developed and well-nourished. No distress.  HENT:  Head: Normocephalic and atraumatic.  Mouth/Throat: Oropharynx is clear and moist. No oropharyngeal exudate.  Eyes: Conjunctivae and EOM are normal. Pupils are equal, round, and reactive to light. No scleral icterus.  Cardiovascular: Normal rate, regular rhythm, normal heart sounds and intact distal pulses.   No murmur heard. Pulmonary/Chest: Effort normal and breath sounds normal. No respiratory distress. She has no wheezes. She has no rales.  Musculoskeletal: She exhibits no edema.  Diabetic  foot exam: Normal inspection No skin breakdown No calluses  Normal DP/PT pulses Normal sensation to light touch and monofilament Nails normal  Skin: Skin is warm and dry. No rash noted.  Psychiatric: She has a normal mood and affect.       Assessment & Plan:

## 2013-05-13 NOTE — Addendum Note (Signed)
Addended by: Eustaquio Boyden on: 05/13/2013 11:53 PM   Modules accepted: Orders

## 2013-05-13 NOTE — Patient Instructions (Addendum)
See natasha to update controlled substance agreement Trial of amitiza for chronic constipation with irritable bowel. Return in 3-4 months for fasting blood work and afterwards for a physical. Good to see you today, call us with questions. Meds refilled today. Flu shot today.

## 2013-05-13 NOTE — Assessment & Plan Note (Addendum)
Check UA today.  UA with 1+ LE but micro reassuring.  Sent UCx.

## 2013-05-13 NOTE — Assessment & Plan Note (Signed)
Requests trial of amitiza - sent in today.

## 2013-05-14 LAB — MICROALBUMIN / CREATININE URINE RATIO
Creatinine,U: 205.3 mg/dL
Microalb Creat Ratio: 0.3 mg/g (ref 0.0–30.0)
Microalb, Ur: 0.7 mg/dL (ref 0.0–1.9)

## 2013-05-16 LAB — URINE CULTURE
Colony Count: NO GROWTH
Organism ID, Bacteria: NO GROWTH

## 2013-05-20 ENCOUNTER — Other Ambulatory Visit (INDEPENDENT_AMBULATORY_CARE_PROVIDER_SITE_OTHER): Payer: PRIVATE HEALTH INSURANCE

## 2013-05-20 ENCOUNTER — Other Ambulatory Visit: Payer: Self-pay | Admitting: Family Medicine

## 2013-05-20 DIAGNOSIS — E119 Type 2 diabetes mellitus without complications: Secondary | ICD-10-CM

## 2013-05-21 ENCOUNTER — Encounter: Payer: Self-pay | Admitting: *Deleted

## 2013-05-21 LAB — BASIC METABOLIC PANEL
BUN: 11 mg/dL (ref 6–23)
CO2: 29 mEq/L (ref 19–32)
Calcium: 9.5 mg/dL (ref 8.4–10.5)
Chloride: 106 mEq/L (ref 96–112)
Creatinine, Ser: 0.7 mg/dL (ref 0.4–1.2)
GFR: 118.53 mL/min (ref >60.00)
Glucose, Bld: 118 mg/dL — ABNORMAL HIGH (ref 70–99)
Potassium: 3.5 mEq/L (ref 3.5–5.1)
Sodium: 140 mEq/L (ref 135–145)

## 2013-05-21 LAB — HEMOGLOBIN A1C: Hgb A1c MFr Bld: 6.8 % — ABNORMAL HIGH (ref 4.6–6.5)

## 2013-05-26 ENCOUNTER — Encounter: Payer: Self-pay | Admitting: Family Medicine

## 2013-06-01 ENCOUNTER — Other Ambulatory Visit: Payer: Self-pay | Admitting: Family Medicine

## 2013-06-06 ENCOUNTER — Other Ambulatory Visit: Payer: Self-pay | Admitting: Family Medicine

## 2013-06-06 NOTE — Telephone Encounter (Signed)
Pt request status of refill alprazolam to [facility] [address].Please advise.

## 2013-06-06 NOTE — Telephone Encounter (Signed)
plz phone in and notify sent.  We hadn't received it until now.

## 2013-06-09 NOTE — Telephone Encounter (Signed)
Rx called in as directed and message left notifying patient. 

## 2013-07-02 ENCOUNTER — Encounter: Payer: Self-pay | Admitting: Gastroenterology

## 2013-07-02 ENCOUNTER — Telehealth: Payer: Self-pay

## 2013-07-02 DIAGNOSIS — I10 Essential (primary) hypertension: Secondary | ICD-10-CM

## 2013-07-02 NOTE — Telephone Encounter (Signed)
Pt left v/m requesting  one touch verio diabetic test strips to [address]pt said she has been having increase in swelling and request HCTZ to [facility] [address]. Left v/m for pt to cb to get more info re; swelling.

## 2013-07-03 MED ORDER — HYDROCHLOROTHIAZIDE 12.5 MG PO TABS: 12.5000 mg | ORAL_TABLET | Freq: Every day | ORAL | Status: DC

## 2013-07-03 NOTE — Telephone Encounter (Signed)
Message left advising patient to call and schedule lab appt and to advise which test strips she needed.

## 2013-07-03 NOTE — Telephone Encounter (Signed)
Yes may add HCTZ 12.5mg  daily to her regimen - but needs to watch diet. I do want her to come in 1 wk after starting HCTZ to check potassium levels.

## 2013-07-03 NOTE — Telephone Encounter (Signed)
Pt will ck with insurance co about what diabetic test strips are on formulary and pt will cb about getting test strips. When pts BP is elevated on and off from 130/90-150/90 pt has swelling in legs and feet and stomach feels tight with occasional h/a in forehead. Pt takes 2 of Lisinopril 20 mg when gets these symptoms and symptoms subside. Pt said years ago she took HCTZ for fluid and wanted to know if could add HCTZ to her meds. Pt is under stress and eating fried foods and pork etc. Pt will try to watch diet more closely eating fresh fruit and vegetables; pt does not want to schedule appt to come in. Please advise. [facility] [address]

## 2013-07-08 ENCOUNTER — Ambulatory Visit: Payer: PRIVATE HEALTH INSURANCE | Admitting: Podiatry

## 2013-08-04 ENCOUNTER — Ambulatory Visit: Payer: PRIVATE HEALTH INSURANCE | Admitting: Gastroenterology

## 2013-08-04 ENCOUNTER — Other Ambulatory Visit: Payer: Self-pay | Admitting: Family Medicine

## 2013-08-04 ENCOUNTER — Telehealth: Payer: Self-pay | Admitting: Gastroenterology

## 2013-08-04 NOTE — Telephone Encounter (Signed)
Ok to refill 

## 2013-08-04 NOTE — Telephone Encounter (Signed)
Do you want to charge? 

## 2013-08-04 NOTE — Telephone Encounter (Signed)
plz phone in. 

## 2013-08-04 NOTE — Telephone Encounter (Signed)
OK - no charge this time 

## 2013-08-05 NOTE — Telephone Encounter (Signed)
Phoned in to pharmacy. 

## 2013-08-07 ENCOUNTER — Other Ambulatory Visit: Payer: Self-pay | Admitting: Family Medicine

## 2013-08-08 ENCOUNTER — Telehealth: Payer: Self-pay

## 2013-08-08 ENCOUNTER — Other Ambulatory Visit: Payer: Self-pay | Admitting: Family Medicine

## 2013-08-08 MED ORDER — METFORMIN HCL 500 MG PO TABS: ORAL_TABLET | ORAL | Status: DC

## 2013-08-08 NOTE — Telephone Encounter (Signed)
Pt left v/m requesting refill metformin. Spoke with Etheleen Sia pharmacist and pt got #60 on 06/02/13 and #30 in 07/2013. Med list has metformin 500 mg taking 1/2 tab daily. Spoke with pt and she monitors her BS closely and she adjust taking her metformin according to BS. Pt said BS had not gotten over 150. Pt has been taking 1 - 1 1/2 tabs daily for last 2 months (pt did on her own). Pt said when her BP is elevated her BS goes up also and pt has been under stress lately. Advised pt she will get cb from Dr Gutierrez's CMA with instructions.

## 2013-08-08 NOTE — Telephone Encounter (Signed)
Pt just left v/m that she is out of medication and has not gotten cb from Dr Timoteo Expose office.Please advise.

## 2013-08-08 NOTE — Telephone Encounter (Signed)
Refilled med with instructions to schedule follow up to discuss it since she has been taking it incorrectly. No refills.

## 2013-08-10 MED ORDER — METFORMIN HCL 500 MG PO TABS: 500.0000 mg | ORAL_TABLET | Freq: Every day | ORAL | Status: DC

## 2013-08-10 NOTE — Addendum Note (Signed)
Addended by: Eustaquio Boyden on: 08/10/2013 09:58 PM   Modules accepted: Orders

## 2013-08-10 NOTE — Telephone Encounter (Signed)
i've sent in new script  Lab Results  Component Value Date   HGBA1C 6.8* 05/20/2013

## 2013-08-12 ENCOUNTER — Ambulatory Visit: Payer: PRIVATE HEALTH INSURANCE | Admitting: Podiatry

## 2013-08-15 NOTE — Telephone Encounter (Addendum)
Meds were refilled meds 1 week ago by Dr Reece Agar. He even increased her to 1 whole pill and gave her # 90 per chart. If she was taking them correctly she would have enough to last through the holidays. She needs an appointment prior to the holidays to discuss further refills.

## 2013-08-15 NOTE — Telephone Encounter (Addendum)
Pt left v/m; wanting refill on med before the holidays since only # 15 called in; left v/m for pt needs to schedule appt and requested pt to cb.

## 2013-08-15 NOTE — Telephone Encounter (Signed)
Left pt detailed message as advised for pt tock with [facility] re to refill of metformin and pt does need to call for appt.

## 2013-08-20 ENCOUNTER — Encounter: Payer: Self-pay | Admitting: Podiatry

## 2013-08-20 ENCOUNTER — Ambulatory Visit (INDEPENDENT_AMBULATORY_CARE_PROVIDER_SITE_OTHER): Payer: PRIVATE HEALTH INSURANCE | Admitting: Podiatry

## 2013-08-20 VITALS — BP 158/102 | Ht 66.0 in | Wt 220.0 lb

## 2013-08-20 DIAGNOSIS — M7750 Other enthesopathy of unspecified foot: Secondary | ICD-10-CM

## 2013-08-20 DIAGNOSIS — M7741 Metatarsalgia, right foot: Secondary | ICD-10-CM

## 2013-08-20 DIAGNOSIS — M2040 Other hammer toe(s) (acquired), unspecified foot: Secondary | ICD-10-CM | POA: Insufficient documentation

## 2013-08-20 NOTE — Progress Notes (Signed)
Subjective:  62 year old female presents complaining of pain on the first and 2nd digits that are pressing on each other.  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  Had done bilateral hammer toe surgeries in 2004 at [facility].  Patient points under the 2nd MPJ area being painful area. Also complained of 2nd and 3rd toes closely stuck together bilateral.   Objective:  Orthopedic:  Status post hammer toe surgery 2-4 bilateral in 2004.  Hypermobile first ray bilateral.  Palpable enlarged 2nd and 3rd MPJ bilateral with pain upon pressure.  Vascular:  Pedal pulses are palpable. No visible edema or erythema noted.  Neurologic:  All epicritic and tactile sensations grossly intact.  Dermatologic: Normotrophic skin without abnormal findings.  Ol X-rays reviewed; Seen lateral deviation of the interphalangeal joint both great toes. Significantly short first Metatarsal bone and relatively long 2nd and 3rd Metatarsal bone bilateral.   Assessment:  Metatarsal length discrepancy, long 2nd and 3rd metatarsal or short first metatarsal bone bilateral.  Hallux abducto interphalangeus bilateral. 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  Plan: Reviewed all clinical findings. Patient request surgical intervention of the left great toe and the 2nd toe. Aiken procedure and Visual merchandiser 2nd reviewed for pre op.

## 2013-08-20 NOTE — Patient Instructions (Addendum)
Seen for abnormal 1st and 2nd digits bilateral. Pre op paper filled out for [facility] procedure left great toe and hammer toe repair 2nd toe left. The recovery will take about 8 weeks. Surgery will be scheduled for September 11, 2013.  Call the office if there is any questions.

## 2013-09-02 ENCOUNTER — Other Ambulatory Visit: Payer: Self-pay | Admitting: Family Medicine

## 2013-09-02 NOTE — Telephone Encounter (Signed)
plz phone in. 

## 2013-09-02 NOTE — Telephone Encounter (Signed)
Last filled 08/05/13--please advise 

## 2013-09-03 NOTE — Telephone Encounter (Signed)
Rx called in as directed.   

## 2013-09-04 DIAGNOSIS — N281 Cyst of kidney, acquired: Secondary | ICD-10-CM

## 2013-09-04 HISTORY — DX: Cyst of kidney, acquired: N28.1

## 2013-09-08 ENCOUNTER — Ambulatory Visit: Payer: PRIVATE HEALTH INSURANCE | Admitting: Gastroenterology

## 2013-09-29 ENCOUNTER — Ambulatory Visit: Payer: PRIVATE HEALTH INSURANCE | Admitting: Podiatry

## 2013-09-29 ENCOUNTER — Other Ambulatory Visit: Payer: Self-pay | Admitting: Family Medicine

## 2013-09-29 NOTE — Telephone Encounter (Signed)
Ok to refill 

## 2013-09-29 NOTE — Telephone Encounter (Signed)
plz phone in. 

## 2013-09-30 NOTE — Telephone Encounter (Signed)
Rx called in as directed.   

## 2013-10-01 ENCOUNTER — Other Ambulatory Visit (INDEPENDENT_AMBULATORY_CARE_PROVIDER_SITE_OTHER): Payer: PRIVATE HEALTH INSURANCE

## 2013-10-01 ENCOUNTER — Ambulatory Visit (INDEPENDENT_AMBULATORY_CARE_PROVIDER_SITE_OTHER): Payer: PRIVATE HEALTH INSURANCE | Admitting: Podiatry

## 2013-10-01 ENCOUNTER — Other Ambulatory Visit: Payer: PRIVATE HEALTH INSURANCE

## 2013-10-01 ENCOUNTER — Encounter: Payer: Self-pay | Admitting: Podiatry

## 2013-10-01 VITALS — BP 167/103 | HR 85 | Ht 66.0 in | Wt 225.0 lb

## 2013-10-01 DIAGNOSIS — I10 Essential (primary) hypertension: Secondary | ICD-10-CM

## 2013-10-01 DIAGNOSIS — M7741 Metatarsalgia, right foot: Secondary | ICD-10-CM

## 2013-10-01 DIAGNOSIS — M7742 Metatarsalgia, left foot: Secondary | ICD-10-CM

## 2013-10-01 DIAGNOSIS — M7750 Other enthesopathy of unspecified foot: Secondary | ICD-10-CM

## 2013-10-01 DIAGNOSIS — M722 Plantar fascial fibromatosis: Secondary | ICD-10-CM

## 2013-10-01 HISTORY — DX: Plantar fascial fibromatosis: M72.2

## 2013-10-01 LAB — BASIC METABOLIC PANEL
BUN: 11 mg/dL (ref 6–23)
CO2: 27 mEq/L (ref 19–32)
Calcium: 9.6 mg/dL (ref 8.4–10.5)
Chloride: 104 mEq/L (ref 96–112)
Creatinine, Ser: 0.7 mg/dL (ref 0.4–1.2)
GFR: 103.55 mL/min (ref >60.00)
Glucose, Bld: 161 mg/dL — ABNORMAL HIGH (ref 70–99)
Potassium: 4 mEq/L (ref 3.5–5.1)
Sodium: 139 mEq/L (ref 135–145)

## 2013-10-01 NOTE — Progress Notes (Signed)
Subjective: Patient presents with pain in left foot at heel area. Pain is worse first thing in the morning and at the end of the day. She looked up and found out she has plantar fasciitis on left foot. She tried her friend's splint, which made it worse. Patient requests injection on left heel.   Objective: Pain in left heel.  Enlarged joint 2nd digits bilateral. Neurovascular status are within normal.  Assessment: Plantar fasciitis left.  Plan: Injection with 4mg  of Dexamethasone and 4mg  of Triamcinolone mixed with 31ml of 1% Xylocaine plain.  Patient will return as needed.

## 2013-10-01 NOTE — Patient Instructions (Signed)
Seen for pain in left heel. Injection given. Return as needed.

## 2013-10-03 ENCOUNTER — Encounter: Payer: Self-pay | Admitting: *Deleted

## 2013-10-05 ENCOUNTER — Encounter (HOSPITAL_COMMUNITY): Payer: Self-pay | Admitting: Emergency Medicine

## 2013-10-05 ENCOUNTER — Emergency Department (INDEPENDENT_AMBULATORY_CARE_PROVIDER_SITE_OTHER)
Admission: EM | Admit: 2013-10-05 | Discharge: 2013-10-05 | Disposition: A | Discharge status: Home / Self Care | Payer: PRIVATE HEALTH INSURANCE | Source: Home / Self Care | Attending: Family Medicine | Admitting: Family Medicine

## 2013-10-05 DIAGNOSIS — I10 Essential (primary) hypertension: Secondary | ICD-10-CM

## 2013-10-05 LAB — POCT I-STAT, CHEM 8
BUN: 10 mg/dL (ref 6–23)
Calcium, Ion: 1.22 mmol/L (ref 1.13–1.30)
Chloride: 104 mEq/L (ref 96–112)
Creatinine, Ser: 0.7 mg/dL (ref 0.50–1.10)
Glucose, Bld: 139 mg/dL — ABNORMAL HIGH (ref 70–99)
HCT: 40 % (ref 36.0–46.0)
Hemoglobin: 13.6 g/dL (ref 12.0–15.0)
Potassium: 3.7 mEq/L (ref 3.7–5.3)
Sodium: 141 mEq/L (ref 137–147)
TCO2: 27 mmol/L (ref 0–100)

## 2013-10-05 MED ORDER — AMLODIPINE BESYLATE 5 MG PO TABS: 5.0000 mg | ORAL_TABLET | Freq: Every day | ORAL | Status: DC

## 2013-10-05 NOTE — Discharge Instructions (Signed)
Drink plenty of water , take medicine as prescribed and see your doctor in 1 week for bp and sugar recheck.

## 2013-10-05 NOTE — ED Notes (Signed)
Pt  Reports  bp  Has  Been high         And   Has  Impending  Surgery     And  Has  Stressors    In  Life

## 2013-10-05 NOTE — ED Provider Notes (Signed)
CSN: [id number]     Arrival date & time 10/05/13  1056 History   First MD Initiated Contact with Patient 10/05/13 1108     Chief Complaint  Patient presents with  . Hypertension   (Consider location/radiation/quality/duration/timing/severity/associated sxs/prior Treatment) Patient is a 63 y.o. female presenting with hypertension. The history is provided by the patient.  Hypertension This is a new problem. The current episode started more than 2 days ago (over last sev days has found bp to be high on random  check). The problem has not changed since onset.Pertinent negatives include no chest pain, no abdominal pain and no headaches. Associated symptoms comments: No assoc sx..    Past Medical History  Diagnosis Date  . HLD (hyperlipidemia)   . Hypertension   . Glaucoma   . Depression with anxiety   . Arthritis     s/p R TKR  . Diabetes type 2, controlled 2011    borderline  . History of ulcer disease    Past Surgical History  Procedure Laterality Date  . Total knee arthroplasty  2004    right TKR (Dr. Eddie Dibbles with [facility])  . Abdominal hysterectomy  2007    fibroids, heavy bleeding   Family History  Problem Relation Age of Onset  . Cancer Maternal Grandmother 58    ovarian  . Cancer Mother 49    bone, MM  . Diabetes Maternal Grandmother   . Hypertension Maternal Grandmother   . Stroke Other     unsure who  . CAD Neg Hx    History  Substance Use Topics  . Smoking status: Never Smoker   . Smokeless tobacco: Never Used  . Alcohol Use: No   OB History   Grav Para Term Preterm Abortions TAB SAB Ect Mult Living   0         0     Review of Systems  Constitutional: Negative.   Respiratory: Negative.   Cardiovascular: Negative.  Negative for chest pain and leg swelling.  Gastrointestinal: Negative.  Negative for abdominal pain.  Neurological: Negative for headaches.    Allergies  Codeine  Home Medications   Current Outpatient Rx  Name  Route  Sig  Dispense   Refill  . ALPRAZolam (XANAX) 0.25 MG tablet      TAKE 1 TABLET BY MOUTH EVERY NIGHT AT BEDTIME AS NEEDED   30 tablet   0   . amLODipine (NORVASC) 5 MG tablet   Oral   Take 1 tablet (5 mg total) by mouth daily.   30 tablet   1   . aspirin EC 81 MG tablet   Oral   Take 81 mg by mouth daily.         . hydrochlorothiazide (HYDRODIURIL) 12.5 MG tablet   Oral   Take 1 tablet (12.5 mg total) by mouth daily.   30 tablet   11   . lisinopril (PRINIVIL,ZESTRIL) 20 MG tablet      TAKE 1 TABLET BY MOUTH DAILY   30 tablet   11   . metFORMIN (GLUCOPHAGE) 500 MG tablet   Oral   Take 1 tablet (500 mg total) by mouth daily with breakfast.   90 tablet   0   . rosuvastatin (CRESTOR) 40 MG tablet      1/2 tablet q hs   15 tablet   11   . trolamine salicylate (ASPERCREME) 10 % cream   Topical   Apply 1 application topically as needed. Applies to legs for  aches and pain          BP 174/97  Pulse 75  Temp(Src) 98.4 F (36.9 C) (Oral)  Resp 17  SpO2 97% Physical Exam  Nursing note and vitals reviewed. Constitutional: She is oriented to person, place, and time. She appears well-developed and well-nourished.  Neck: Normal range of motion. Neck supple.  Cardiovascular: Normal rate, regular rhythm, normal heart sounds and intact distal pulses.   Pulmonary/Chest: Effort normal and breath sounds normal.  Musculoskeletal: She exhibits no edema.  Lymphadenopathy:    She has no cervical adenopathy.  Neurological: She is alert and oriented to person, place, and time.  Skin: Skin is warm and dry.    ED Course  Procedures (including critical care time) Labs Review Labs Reviewed  POCT I-STAT, CHEM 8 - Abnormal; Notable for the following:    Glucose, Bld 139 (*)    All other components within normal limits   Imaging Review No results found.    MDM  i-stat wnl x bs 139.    Billy Fischer, MD 10/05/13 [phone]

## 2013-10-06 ENCOUNTER — Ambulatory Visit (INDEPENDENT_AMBULATORY_CARE_PROVIDER_SITE_OTHER): Payer: PRIVATE HEALTH INSURANCE | Admitting: Family Medicine

## 2013-10-06 ENCOUNTER — Encounter: Payer: Self-pay | Admitting: Family Medicine

## 2013-10-06 VITALS — BP 140/82 | HR 88 | Temp 98.3°F | Wt 229.0 lb

## 2013-10-06 DIAGNOSIS — I10 Essential (primary) hypertension: Secondary | ICD-10-CM

## 2013-10-06 DIAGNOSIS — R079 Chest pain, unspecified: Secondary | ICD-10-CM

## 2013-10-06 DIAGNOSIS — F419 Anxiety disorder, unspecified: Secondary | ICD-10-CM

## 2013-10-06 DIAGNOSIS — F32A Depression, unspecified: Secondary | ICD-10-CM

## 2013-10-06 DIAGNOSIS — F341 Dysthymic disorder: Secondary | ICD-10-CM

## 2013-10-06 DIAGNOSIS — F329 Major depressive disorder, single episode, unspecified: Secondary | ICD-10-CM

## 2013-10-06 DIAGNOSIS — E7800 Pure hypercholesterolemia, unspecified: Secondary | ICD-10-CM

## 2013-10-06 DIAGNOSIS — E119 Type 2 diabetes mellitus without complications: Secondary | ICD-10-CM

## 2013-10-06 MED ORDER — NITROGLYCERIN 0.4 MG SL SUBL: 0.4000 mg | SUBLINGUAL_TABLET | SUBLINGUAL | Status: DC | PRN

## 2013-10-06 NOTE — Assessment & Plan Note (Signed)
Chronic, recently deteriorated but now better control on amlodipine 5mg  daily - continue

## 2013-10-06 NOTE — Progress Notes (Signed)
Pre-visit discussion using our clinic review tool. No additional management support is needed unless otherwise documented below in the visit note.  

## 2013-10-06 NOTE — Progress Notes (Signed)
   Subjective:    Patient ID: Melissa [name], female    DOB: 01-Jan-1951, 63 y.o.   MRN: [id number]  HPI CC: HTN   Recently noticed elevated readings over last few weeks at several medical offices (podiatry, urgent care).  Also noticing some tightness in chest intermittently over the last month and dyspnea with exertion for last 4 months (ie when walking from parking lot to car).  Does endorse some orthopnea.  HTN - Compliant with current antihypertensive regimen of lisinopril 20mg  PM, hydrochlorothiazide 12.5mg  AM (had missed some doses).  Recently seen at urgent care who started her on amlodipine 5mg  daily (yesterday).  Does not check blood pressures at home (bp cuff broke at home).  No low blood pressure symptoms of dizziness/syncope.  Denies HA, vision changes.  + leg swelling and cramping.  Planning R knee surgery in March by [facility].  No fmhx CAD or personal h/o CAD.  Does not know father's history. Never smoker.  Denies GERD sxs. Lab Results  Component Value Date   CHOL 194 10/09/2012   HDL 64 10/09/2012   LDLCALC 104* 10/09/2012   TRIG 131 10/09/2012   CHOLHDL 3.0 10/09/2012    Depression - has had issue in the past.  Was on prozac, did not like how she felt with this.  This has been going on for several months.  Denies SI currently.     Wt Readings from Last 3 Encounters:  10/06/13 229 lb (103.874 kg)  10/01/13 225 lb (102.059 kg)  08/20/13 220 lb (99.791 kg)  Body mass index is 36.98 kg/(m^2). BP Readings from Last 3 Encounters:  10/06/13 140/82  10/05/13 174/97  10/01/13 167/103   Lab Results  Component Value Date   HGBA1C 6.8* 05/20/2013     Past Medical History  Diagnosis Date  . HLD (hyperlipidemia)   . Hypertension   . Glaucoma   . Depression with anxiety   . Arthritis     s/p R TKR  . Diabetes type 2, controlled 2011    borderline  . History of ulcer disease     Past Surgical History  Procedure Laterality Date  . Total knee arthroplasty  2004      right TKR (Dr. Eddie Dibbles with [facility])  . Abdominal hysterectomy  2007    fibroids, heavy bleeding    Family History  Problem Relation Age of Onset  . Cancer Maternal Grandmother 22    ovarian  . Cancer Mother 39    bone, MM  . Diabetes Maternal Grandmother   . Hypertension Maternal Grandmother   . Stroke Other     unsure who  . CAD Neg Hx     Review of Systems Per HPI     Objective:   Physical Exam  Nursing note and vitals reviewed. Constitutional: She appears well-developed and well-nourished. No distress.  HENT:  Mouth/Throat: Oropharynx is clear and moist. No oropharyngeal exudate.  Cardiovascular: Normal rate, regular rhythm, normal heart sounds and intact distal pulses.   No murmur heard. Pulmonary/Chest: Effort normal and breath sounds normal. No respiratory distress. She has no wheezes. She has no rales.  Musculoskeletal: She exhibits no edema.  Skin: Skin is warm and dry. No rash noted.  Psychiatric: She exhibits a depressed mood.  Tears with discussion of stressors       Assessment & Plan:

## 2013-10-06 NOTE — Assessment & Plan Note (Addendum)
Deteriorated recently.  Pt declines pharmacotherapy for now, but would be open to this in future. Discussed stress relieving strategies as well as counseling.  Pt interested so will refer today. PHQ9 = 15/27, somewhat difficult to take care of day to day activities.

## 2013-10-06 NOTE — Patient Instructions (Signed)
Pass by Melissa [name]'s office for referral for counseling as well as for heart doctor evaluation Your blood pressure is doing better today. Blood work today. Continue all meds as up to now including aspirin 81mg  daily. Good to see you today, call us with quesitons.

## 2013-10-06 NOTE — Assessment & Plan Note (Addendum)
Describes left sided substernal chest pressure sensation along with dyspnea on exertion Overall atypical pain, but given comorbidies (DM, HLD, just recently controlled HTN, and obesity), along with upcoming knee surgery, do think it prudent to further evaluate with cardiology referral and possible stress test. EKG today - NSR rage 90s, normal axis, intervals, no hypertrophy, diffuse ST/T flattening  Will check blood work including TSH and TnI today.  Will refer to cards.

## 2013-10-06 NOTE — Assessment & Plan Note (Signed)
Recheck A1c today. Lab Results  Component Value Date   HGBA1C 6.8* 05/20/2013

## 2013-10-06 NOTE — Assessment & Plan Note (Signed)
Stable on crestor 20mg  daily - continue.

## 2013-10-07 ENCOUNTER — Telehealth: Payer: Self-pay | Admitting: Family Medicine

## 2013-10-07 ENCOUNTER — Encounter: Payer: Self-pay | Admitting: Cardiology

## 2013-10-07 ENCOUNTER — Ambulatory Visit (INDEPENDENT_AMBULATORY_CARE_PROVIDER_SITE_OTHER): Payer: PRIVATE HEALTH INSURANCE | Admitting: Cardiology

## 2013-10-07 VITALS — BP 142/86 | HR 72 | Ht 66.0 in | Wt 230.0 lb

## 2013-10-07 DIAGNOSIS — I10 Essential (primary) hypertension: Secondary | ICD-10-CM

## 2013-10-07 DIAGNOSIS — R0989 Other specified symptoms and signs involving the circulatory and respiratory systems: Secondary | ICD-10-CM

## 2013-10-07 DIAGNOSIS — R0600 Dyspnea, unspecified: Secondary | ICD-10-CM

## 2013-10-07 DIAGNOSIS — E7800 Pure hypercholesterolemia, unspecified: Secondary | ICD-10-CM

## 2013-10-07 DIAGNOSIS — R079 Chest pain, unspecified: Secondary | ICD-10-CM

## 2013-10-07 DIAGNOSIS — R0609 Other forms of dyspnea: Secondary | ICD-10-CM

## 2013-10-07 DIAGNOSIS — E119 Type 2 diabetes mellitus without complications: Secondary | ICD-10-CM

## 2013-10-07 DIAGNOSIS — E785 Hyperlipidemia, unspecified: Secondary | ICD-10-CM

## 2013-10-07 LAB — TSH: TSH: 0.61 u[IU]/mL (ref 0.35–5.50)

## 2013-10-07 LAB — TROPONIN I: Troponin I: <0.01 ng/mL (ref <0.06)

## 2013-10-07 LAB — LDL CHOLESTEROL, DIRECT: Direct LDL: 116.5 mg/dL

## 2013-10-07 LAB — HEMOGLOBIN A1C: Hgb A1c MFr Bld: 6.8 % — ABNORMAL HIGH (ref 4.6–6.5)

## 2013-10-07 NOTE — Patient Instructions (Signed)
We will schedule you for a nuclear stress test and an Echocardiogram  Continue your current therapy 

## 2013-10-07 NOTE — Telephone Encounter (Signed)
Relevant patient education assigned to patient using Emmi. ° °

## 2013-10-07 NOTE — Progress Notes (Signed)
Melissa [name]: 1951/07/23 Medical Record #[id number]  History of Present Illness: Melissa [name] is seen at the request of Dr. Vincent Gros for evaluation of chest pain. She is a pleasan 63 yo BF with history of HTN, hypercholesterolemia, and DM type 2. She is a nonsmoker. She does not know her father's medical history. For several months she reports symptoms of increased dyspnea on exertion and substernal chest pain. She states this feels like an elephant sitting on her chest. Symptoms worse with exertion but may occur at rest. BP was high at 177/113. Norvasc added and BP is trending down. Has tried sl Ntg x 1 with improvement. Reports she had an ETT one year ago that was OK. Exercise limited by chronic knee problems and she states she is going to need a TKR.   Current Outpatient Prescriptions on File Prior to Visit  Medication Sig Dispense Refill  . ALPRAZolam (XANAX) 0.25 MG tablet TAKE 1 TABLET BY MOUTH EVERY NIGHT AT BEDTIME AS NEEDED  30 tablet  0  . amLODipine (NORVASC) 5 MG tablet Take 1 tablet (5 mg total) by mouth daily.  30 tablet  1  . aspirin EC 81 MG tablet Take 81 mg by mouth daily.      . hydrochlorothiazide (HYDRODIURIL) 12.5 MG tablet Take 1 tablet (12.5 mg total) by mouth daily.  30 tablet  11  . lisinopril (PRINIVIL,ZESTRIL) 20 MG tablet TAKE 1 TABLET BY MOUTH DAILY  30 tablet  11  . metFORMIN (GLUCOPHAGE) 500 MG tablet Take 1 tablet (500 mg total) by mouth daily with breakfast.  90 tablet  0  . nitroGLYCERIN (NITROSTAT) 0.4 MG SL tablet Place 1 tablet (0.4 mg total) under the tongue every 5 (five) minutes as needed for chest pain. If used seek urgent care  30 tablet  0  . rosuvastatin (CRESTOR) 40 MG tablet 1/2 tablet q hs  15 tablet  11  . trolamine salicylate (ASPERCREME) 10 % cream Apply 1 application topically as needed. Applies to legs for aches and pain       No current facility-administered medications on file prior to visit.    Allergies  Allergen  Reactions  . Codeine Other (See Comments)    palpitations    Past Medical History  Diagnosis Date  . HLD (hyperlipidemia)   . Hypertension   . Glaucoma   . Depression with anxiety   . Arthritis     s/p R TKR  . Diabetes type 2, controlled 2011    borderline  . History of ulcer disease   . Chest pain on exertion   . Dyspnea     Past Surgical History  Procedure Laterality Date  . Total knee arthroplasty  2004    right TKR (Dr. Eddie Dibbles with [facility])  . Abdominal hysterectomy  2007    fibroids, heavy bleeding    History  Smoking status  . Never Smoker   Smokeless tobacco  . Never Used    History  Alcohol Use No    Family History  Problem Relation Age of Onset  . Cancer Maternal Grandmother 58    ovarian  . Diabetes Maternal Grandmother   . Hypertension Maternal Grandmother   . Cancer Mother 42    bone, MM  . Stroke Other     unsure who  . CAD Neg Hx     Review of Systems: As noted in HPI.  All other systems were reviewed and are negative.  Physical Exam: BP 142/86  Pulse 72  Ht 5\' 6"  (1.676 m)  Wt 230 lb (104.327 kg)  BMI 37.14 kg/m2 She is a pleasant obese BF in NAD HEENT: [place]/AT, PERRL, EOMI, sclera are clear, oropharynx is clear. Neck: no JVD, adenopathy, bruits, or thyromegaly Lungs: clear. CV: RRR, normal S1-2, no gallop or murmur. Abd: soft, obese, no masses, bruits, or HSM Ext: no cyanosis or edema, pulses 2+ Skin: warm and dry Neuro: alert and oriented x 3. CN II-XII intact.  LABORATORY DATA: Ecg: NSR T wave inversion in the inferior and lateral leads new since 2013.  Lab Results  Component Value Date   WBC 5.4 01/07/2012   HGB 13.6 10/05/2013   HCT 40.0 10/05/2013   PLT 286 01/07/2012   GLUCOSE 139* 10/05/2013   CHOL 194 10/09/2012   TRIG 131 10/09/2012   HDL 64 10/09/2012   LDLCALC 104* 10/09/2012   ALT 16 10/09/2012   AST 19 10/09/2012   NA 141 10/05/2013   K 3.7 10/05/2013   CL 104 10/05/2013   CREATININE 0.70 10/05/2013   BUN 10 10/05/2013   CO2  27 10/01/2013   TSH 1.098 01/06/2012   INR 1.01 01/06/2012   HGBA1C 6.8* 05/20/2013   MICROALBUR 0.7 05/13/2013     Assessment / Plan: 1. Chest pain and dyspnea very concerning for angina pectoris. Multiple cardiac risk factors. New T wave changes on Ecg. Agree with addition of Norvasc and prn sl Ntg. Will schedule for stress Myoview. Will also obtain an Echocardiogram. If pain doesn't respond to Ntg she is instructed to call 911 and go to the ER.  2. HTN. BP improved. Continue lisinopril, amlodipine, and HCTZ. If further BP control needed consider beta blocker  3. Hyperlipidemia on high dose statin.  4. DM type 2  5. Obesity

## 2013-10-10 ENCOUNTER — Telehealth: Payer: Self-pay

## 2013-10-10 NOTE — Telephone Encounter (Signed)
Relevant patient education assigned to patient using Emmi. ° °

## 2013-10-22 ENCOUNTER — Other Ambulatory Visit: Payer: Self-pay | Admitting: Family Medicine

## 2013-10-22 NOTE — Telephone Encounter (Signed)
plz phone in. 

## 2013-10-22 NOTE — Telephone Encounter (Signed)
Ok to refill 

## 2013-10-23 NOTE — Telephone Encounter (Signed)
Rx called in as directed.   

## 2013-10-27 ENCOUNTER — Other Ambulatory Visit: Payer: Self-pay | Admitting: Family Medicine

## 2013-10-28 ENCOUNTER — Other Ambulatory Visit (HOSPITAL_COMMUNITY): Payer: PRIVATE HEALTH INSURANCE

## 2013-11-03 ENCOUNTER — Ambulatory Visit (HOSPITAL_COMMUNITY): Payer: PRIVATE HEALTH INSURANCE | Attending: Cardiovascular Disease | Admitting: Radiology

## 2013-11-03 ENCOUNTER — Ambulatory Visit (HOSPITAL_BASED_OUTPATIENT_CLINIC_OR_DEPARTMENT_OTHER): Payer: PRIVATE HEALTH INSURANCE | Admitting: Radiology

## 2013-11-03 ENCOUNTER — Encounter: Payer: Self-pay | Admitting: Cardiovascular Disease

## 2013-11-03 VITALS — BP 126/90 | HR 76 | Ht 66.0 in | Wt 222.0 lb

## 2013-11-03 DIAGNOSIS — E785 Hyperlipidemia, unspecified: Secondary | ICD-10-CM

## 2013-11-03 DIAGNOSIS — E119 Type 2 diabetes mellitus without complications: Secondary | ICD-10-CM

## 2013-11-03 DIAGNOSIS — I10 Essential (primary) hypertension: Secondary | ICD-10-CM

## 2013-11-03 DIAGNOSIS — R0602 Shortness of breath: Secondary | ICD-10-CM

## 2013-11-03 DIAGNOSIS — E7800 Pure hypercholesterolemia, unspecified: Secondary | ICD-10-CM

## 2013-11-03 DIAGNOSIS — R072 Precordial pain: Secondary | ICD-10-CM | POA: Insufficient documentation

## 2013-11-03 DIAGNOSIS — R0600 Dyspnea, unspecified: Secondary | ICD-10-CM

## 2013-11-03 DIAGNOSIS — R079 Chest pain, unspecified: Secondary | ICD-10-CM

## 2013-11-03 MED ORDER — TECHNETIUM TC 99M SESTAMIBI GENERIC - CARDIOLITE
10.0000 | Freq: Once | INTRAVENOUS | Status: AC | PRN
  Administered 2013-11-03: 10 via INTRAVENOUS

## 2013-11-03 MED ORDER — REGADENOSON 0.4 MG/5ML IV SOLN
0.4000 mg | Freq: Once | INTRAVENOUS | Status: AC
  Administered 2013-11-03: 0.4 mg via INTRAVENOUS

## 2013-11-03 MED ORDER — TECHNETIUM TC 99M SESTAMIBI GENERIC - CARDIOLITE
30.0000 | Freq: Once | INTRAVENOUS | Status: AC | PRN
  Administered 2013-11-03: 30 via INTRAVENOUS

## 2013-11-03 NOTE — Progress Notes (Signed)
[facility] [address] [place], [place] [address] [phone]    Cardiology Nuclear Med Study  Melissa [name] is a 63 y.o. female     MRN : [id number]     DOB: 14-Nov-1950  Procedure Date: 11/03/2013  Nuclear Med Background Indication for Stress Test:  Evaluation for Ischemia, Abnormal EKG, and Pending Surgical Clearance for  (R) TKR by Dr. Al Corpus at [facility] History:  No known CAD, Echo (pending) Cardiac Risk Factors: Hypertension, Lipids and NIDDM  Symptoms:  Chest Pain and DOE   Nuclear Pre-Procedure Caffeine/Decaff Intake:  None >12 hrs NPO After: 9:00pm   Lungs:  clear O2 Sat: 98% on room air. IV 0.9% NS with Angio Cath:  22g  IV Site: R Antecubital x 1, tolerated well IV Started by:  Irven Baltimore, RN  Chest Size (in):  40 Cup Size: D  Height: 5\' 6"  (1.676 m)  Weight:  222 lb (100.699 kg)  BMI:  Body mass index is 35.85 kg/(m^2). Tech Comments:  Took Lisinopril and Norvasc this am. Patient held Metformin this am. Irven Baltimore, RN.    Nuclear Med Study 1 or 2 day study: 1 day  Stress Test Type:  Treadmill/Lexiscan  Reading MD: N/A  Order Authorizing Provider:  Verlyn Dannenberg [place], MD  Resting Radionuclide: Technetium 67m Sestamibi  Resting Radionuclide Dose: 11.0 mCi   Stress Radionuclide:  Technetium 34m Sestamibi  Stress Radionuclide Dose: 33.0 mCi           Stress Protocol Rest HR: 76 Stress HR: 118  Rest BP: 126/90 Stress BP: 150/70  Exercise Time (min): n/a METS: n/a           Dose of Adenosine (mg):  n/a Dose of Lexiscan: 0.4 mg  Dose of Atropine (mg): n/a Dose of Dobutamine: n/a mcg/kg/min (at max HR)  Stress Test Technologist: Glade Lloyd, BS-ES  Nuclear Technologist:  Charlton Amor, CNMT     Rest Procedure:  Myocardial perfusion imaging was performed at rest 45 minutes following the intravenous administration of Technetium 4m Sestamibi. Rest ECG: NSR nonspecific ST/T Wave changes  Stress Procedure:  The  patient received IV Lexiscan 0.4 mg over 15-seconds with concurrent low level exercise and then Technetium 57m Sestamibi was injected at 30-seconds while the patient continued walking one more minute.  Quantitative spect images were obtained after a 45-minute delay. Stress ECG: No significant change from baseline ECG  QPS Raw Data Images:  Normal; no motion artifact; normal heart/lung ratio. Stress Images:  Normal homogeneous uptake in all areas of the myocardium. Rest Images:  Normal homogeneous uptake in all areas of the myocardium. Subtraction (SDS):  Normal Transient Ischemic Dilatation (Normal <1.22):  0.92 Lung/Heart Ratio (Normal <0.45):  0.32  Quantitative Gated Spect Images QGS EDV:  66 ml QGS ESV:  15 ml  Impression Exercise Capacity:  Lexiscan with low level exercise. BP Response:  Normal blood pressure response. Clinical Symptoms:  There is dyspnea. ECG Impression:  No significant ST segment change suggestive of ischemia. Comparison with Prior Nuclear Study: No images to compare  Overall Impression:  Normal stress nuclear study.  LV Ejection Fraction: 78%.  LV Wall Motion:  NL LV Function; NL Wall Motion   [name]

## 2013-11-03 NOTE — Progress Notes (Signed)
Echocardiogram performed.  

## 2013-11-05 ENCOUNTER — Other Ambulatory Visit: Payer: Self-pay

## 2013-11-05 ENCOUNTER — Telehealth: Payer: Self-pay

## 2013-11-05 DIAGNOSIS — I10 Essential (primary) hypertension: Secondary | ICD-10-CM

## 2013-11-05 MED ORDER — LISINOPRIL 20 MG PO TABS: ORAL_TABLET | ORAL | Status: DC

## 2013-11-05 MED ORDER — AMLODIPINE BESYLATE 5 MG PO TABS: 5.0000 mg | ORAL_TABLET | Freq: Every day | ORAL | Status: DC

## 2013-11-05 NOTE — Telephone Encounter (Signed)
Returned call to patient Melissa [name] results given.Dr.Jordan cleared patient for up coming knee surgery.Will fax myoview report to Dr.Lang at [facility].

## 2013-11-05 NOTE — Telephone Encounter (Signed)
New message    Patient calling need cardiac clearance     Dr. Orene Desanctis at [facility] fax # [phone]- attention robin.

## 2013-11-13 ENCOUNTER — Other Ambulatory Visit: Payer: Self-pay | Admitting: Family Medicine

## 2013-11-14 NOTE — Telephone Encounter (Signed)
plz phone in. 

## 2013-11-14 NOTE — Telephone Encounter (Signed)
Ok to refill 

## 2013-11-14 NOTE — Telephone Encounter (Signed)
Rx called in as directed.   

## 2013-12-02 ENCOUNTER — Emergency Department (HOSPITAL_COMMUNITY)
Admission: EM | Admit: 2013-12-02 | Discharge: 2013-12-02 | Disposition: A | Discharge status: Home / Self Care | Payer: PRIVATE HEALTH INSURANCE | Source: Home / Self Care | Attending: Family Medicine | Admitting: Family Medicine

## 2013-12-02 ENCOUNTER — Encounter (HOSPITAL_COMMUNITY): Payer: Self-pay | Admitting: Emergency Medicine

## 2013-12-02 DIAGNOSIS — R110 Nausea: Secondary | ICD-10-CM

## 2013-12-02 DIAGNOSIS — S76911A Strain of unspecified muscles, fascia and tendons at thigh level, right thigh, initial encounter: Secondary | ICD-10-CM

## 2013-12-02 DIAGNOSIS — IMO0002 Reserved for concepts with insufficient information to code with codable children: Secondary | ICD-10-CM

## 2013-12-02 DIAGNOSIS — R197 Diarrhea, unspecified: Secondary | ICD-10-CM

## 2013-12-02 LAB — POCT URINALYSIS DIP (DEVICE)
Glucose, UA: NEGATIVE mg/dL
Hgb urine dipstick: NEGATIVE
Ketones, ur: NEGATIVE mg/dL
Nitrite: NEGATIVE
Protein, ur: 30 mg/dL — AB
Specific Gravity, Urine: >=1.03 (ref 1.005–1.030)
Urobilinogen, UA: 0.2 mg/dL (ref 0.0–1.0)
pH: 5.5 (ref 5.0–8.0)

## 2013-12-02 MED ORDER — LOPERAMIDE HCL 2 MG PO CAPS: 2.0000 mg | ORAL_CAPSULE | Freq: Four times a day (QID) | ORAL | Status: DC | PRN

## 2013-12-02 MED ORDER — ONDANSETRON HCL 4 MG PO TABS: 4.0000 mg | ORAL_TABLET | Freq: Three times a day (TID) | ORAL | Status: DC | PRN

## 2013-12-02 MED ORDER — TRAMADOL HCL 50 MG PO TABS: 50.0000 mg | ORAL_TABLET | Freq: Two times a day (BID) | ORAL | Status: DC | PRN

## 2013-12-02 MED ORDER — ALIGN 4 MG PO CAPS: 1.0000 | ORAL_CAPSULE | Freq: Every day | ORAL | Status: DC

## 2013-12-02 NOTE — ED Provider Notes (Signed)
CSN: [id number]     Arrival date & time 12/02/13  1529 History   First MD Initiated Contact with Patient 12/02/13 1617     Chief Complaint  Patient presents with  . Abdominal Pain   (Consider location/radiation/quality/duration/timing/severity/associated sxs/prior Treatment) HPI Comments: Patient reports 4-5 days of diarrhea. She reports she recently made significant changes to her diet, eliminating dairy and eating quite a bit of fiber, bran and soy products in an effort to lose weight. She made these dietary changes 2 weeks ago. Denies fever, vomiting, blood in stool, melena, recent travel, recent antibiotic use, raw meat or seafood intake. No GU issues. Endorses mild epigastric discomfort.  Also wishes to mention that she is due to have right TKR in July 2015 and often has to adjust her gait to accomodates painful right knee. She feels that as a result she has strained a muscle in her right groin. This area has become uncomfortable with ROM of her right thigh and hip, particularly with flexion at right hip.  No fever or rash.    The history is provided by the patient.    Past Medical History  Diagnosis Date  . HLD (hyperlipidemia)   . Hypertension   . Glaucoma   . Depression with anxiety   . Arthritis     s/p R TKR  . Diabetes type 2, controlled 2011    borderline  . History of ulcer disease   . Chest pain on exertion   . Dyspnea    Past Surgical History  Procedure Laterality Date  . Total knee arthroplasty  2004    right TKR (Dr. Eddie Dibbles with [facility])  . Abdominal hysterectomy  2007    fibroids, heavy bleeding   Family History  Problem Relation Age of Onset  . Cancer Maternal Grandmother 26    ovarian  . Diabetes Maternal Grandmother   . Hypertension Maternal Grandmother   . Cancer Mother 34    bone, MM  . Stroke Other     unsure who  . CAD Neg Hx    History  Substance Use Topics  . Smoking status: Never Smoker   . Smokeless tobacco: Never Used  . Alcohol  Use: No   OB History   Grav Para Term Preterm Abortions TAB SAB Ect Mult Living   0         0     Review of Systems  Constitutional: Negative for fever and fatigue.  HENT: Negative.   Eyes: Negative.   Respiratory: Negative.   Cardiovascular: Negative.   Gastrointestinal: Positive for nausea and diarrhea. Negative for vomiting, abdominal pain, constipation, blood in stool, abdominal distention, anal bleeding and rectal pain.  Endocrine: Negative for polydipsia, polyphagia and polyuria.  Genitourinary: Negative.   Musculoskeletal:       See HPI  Skin: Negative.   Neurological: Negative.   Psychiatric/Behavioral: Negative.     Allergies  Codeine  Home Medications   Current Outpatient Rx  Name  Route  Sig  Dispense  Refill  . ALPRAZolam (XANAX) 0.25 MG tablet      TAKE 1 TABLET BY MOUTH EVERY NIGHT AT BEDTIME   30 tablet   0   . amLODipine (NORVASC) 5 MG tablet   Oral   Take 1 tablet (5 mg total) by mouth daily.   30 tablet   11   . hydrochlorothiazide (HYDRODIURIL) 12.5 MG tablet   Oral   Take 1 tablet (12.5 mg total) by mouth daily.   [place]  tablet   11   . lisinopril (PRINIVIL,ZESTRIL) 20 MG tablet      TAKE 1 TABLET BY MOUTH DAILY   30 tablet   11   . rosuvastatin (CRESTOR) 40 MG tablet      1/2 tablet q hs   15 tablet   11   . aspirin EC 81 MG tablet   Oral   Take 81 mg by mouth daily.         [name] loperamide (IMODIUM) 2 MG capsule   Oral   Take 1 capsule (2 mg total) by mouth 4 (four) times daily as needed for diarrhea or loose stools.   12 capsule   0   . metFORMIN (GLUCOPHAGE) 500 MG tablet      TAKE ONE TABLET BY MOUTH ONCE DAILY WITH BREAKFAST   90 tablet   1   . nitroGLYCERIN (NITROSTAT) 0.4 MG SL tablet   Sublingual   Place 1 tablet (0.4 mg total) under the tongue every 5 (five) minutes as needed for chest pain. If used seek urgent care   30 tablet   0   . ondansetron (ZOFRAN) 4 MG tablet   Oral   Take 1 tablet (4 mg total) by  mouth every 8 (eight) hours as needed for nausea.   12 tablet   0   . Probiotic Product (ALIGN) 4 MG CAPS   Oral   Take 1 capsule (4 mg total) by mouth daily.   7 capsule   0   . traMADol (ULTRAM) 50 MG tablet   Oral   Take 1 tablet (50 mg total) by mouth every 12 (twelve) hours as needed for moderate pain.   15 tablet   0   . trolamine salicylate (ASPERCREME) 10 % cream   Topical   Apply 1 application topically as needed. Applies to legs for aches and pain          BP 110/72  Pulse 84  Temp(Src) 98.2 F (36.8 C) (Oral)  Resp 16  SpO2 100% Physical Exam  Nursing note and vitals reviewed. Constitutional: She is oriented to person, place, and time. She appears well-developed and well-nourished.  +obese  HENT:  Head: Normocephalic and atraumatic.  Eyes: Conjunctivae are normal. No scleral icterus.  Cardiovascular: Normal rate, regular rhythm and normal heart sounds.   Pulmonary/Chest: Effort normal and breath sounds normal.  Abdominal: Soft. Bowel sounds are normal. She exhibits no distension and no mass. There is no tenderness. There is no rebound and no guarding.  Exam limited by body habitus  Musculoskeletal:       Right hip: She exhibits tenderness. She exhibits normal range of motion, normal strength, no bony tenderness, no swelling, no crepitus, no deformity and no laceration.       Legs: Outlined area is area of discomfort with flexion of right hip. Tender with palpation along iliopsoas muscle and sartorius muscle.   Neurological: She is alert and oriented to person, place, and time.  Skin: Skin is warm and dry. No rash noted.  Psychiatric: She has a normal mood and affect. Her behavior is normal.    ED Course  Procedures (including critical care time) Labs Review Labs Reviewed  POCT URINALYSIS DIP (DEVICE) - Abnormal; Notable for the following:    Bilirubin Urine SMALL (*)    Protein, ur 30 (*)    Leukocytes, UA TRACE (*)    All other components within  normal limits  STOOL CULTURE   Imaging Review No results  found.   MDM   1. Diarrhea   2. Muscle strain of right thigh   3. Nausea    Will advise bringing stool for testing when available. Use probiotic as prescribed. Zofran for nausea. Loperamide for diarrhea. Ultram as Rx'ed for right hip/thigh pain. Follow up with PCP if symptoms do not improve. Advised we would contact her by phone is stool results indicate need for additional treatment.   [place], [place] 12/02/13 [phone]

## 2013-12-02 NOTE — ED Notes (Signed)
Pt c/o abd pain onset 5 days Reports she can't keep anything down; diarrhea; intermittent fevers Also c/o right leg pain; increases w/activity Denies inj/trauma Alert w/no signs of acute distre

## 2013-12-03 NOTE — ED Provider Notes (Signed)
Medical screening examination/treatment/procedure(s) were performed by resident physician or non-physician practitioner and as supervising physician I was immediately available for consultation/collaboration.   KINDL,JAMES DOUGLAS MD.   James D Kindl, MD 12/03/13 2046 

## 2013-12-09 ENCOUNTER — Other Ambulatory Visit: Payer: Self-pay

## 2013-12-09 DIAGNOSIS — Z1231 Encounter for screening mammogram for malignant neoplasm of breast: Secondary | ICD-10-CM

## 2013-12-14 ENCOUNTER — Other Ambulatory Visit: Payer: Self-pay | Admitting: Family Medicine

## 2013-12-14 NOTE — Telephone Encounter (Signed)
plz phone in. 

## 2013-12-15 NOTE — Telephone Encounter (Signed)
Rx called in as directed.   

## 2014-01-08 ENCOUNTER — Other Ambulatory Visit: Payer: Self-pay | Admitting: Family Medicine

## 2014-01-08 NOTE — Telephone Encounter (Signed)
plz phone in. 

## 2014-01-08 NOTE — Telephone Encounter (Signed)
Rx called in as directed.   

## 2014-01-08 NOTE — Telephone Encounter (Signed)
Ok to refill 

## 2014-01-15 ENCOUNTER — Ambulatory Visit: Payer: PRIVATE HEALTH INSURANCE

## 2014-01-19 ENCOUNTER — Other Ambulatory Visit: Payer: Self-pay | Admitting: Family Medicine

## 2014-02-02 ENCOUNTER — Encounter (INDEPENDENT_AMBULATORY_CARE_PROVIDER_SITE_OTHER): Payer: Self-pay

## 2014-02-02 ENCOUNTER — Ambulatory Visit
Admission: RE | Admit: 2014-02-02 | Discharge: 2014-02-02 | Disposition: A | Discharge status: Home / Self Care | Payer: PRIVATE HEALTH INSURANCE | Source: Ambulatory Visit

## 2014-02-02 DIAGNOSIS — Z1231 Encounter for screening mammogram for malignant neoplasm of breast: Secondary | ICD-10-CM

## 2014-02-03 ENCOUNTER — Encounter: Payer: Self-pay | Admitting: Family Medicine

## 2014-02-05 ENCOUNTER — Other Ambulatory Visit: Payer: Self-pay

## 2014-02-05 MED ORDER — ALPRAZOLAM 0.25 MG PO TABS: ORAL_TABLET | ORAL | Status: DC

## 2014-02-05 NOTE — Telephone Encounter (Signed)
Rx called in as directed.   

## 2014-02-05 NOTE — Telephone Encounter (Signed)
Pt left v/m requesting refill alprazolam to [facility] [address]. Please advise.

## 2014-02-05 NOTE — Telephone Encounter (Signed)
plz phone in. 

## 2014-02-24 DIAGNOSIS — M545 Low back pain: Secondary | ICD-10-CM | POA: Insufficient documentation

## 2014-02-24 DIAGNOSIS — M5459 Other low back pain: Secondary | ICD-10-CM | POA: Insufficient documentation

## 2014-03-02 ENCOUNTER — Other Ambulatory Visit: Payer: Self-pay | Admitting: Family Medicine

## 2014-03-02 NOTE — Telephone Encounter (Signed)
Ok to refill 

## 2014-03-03 NOTE — Telephone Encounter (Signed)
plz phone in. 

## 2014-03-03 NOTE — Telephone Encounter (Signed)
Rx called in as directed.   

## 2014-03-04 DIAGNOSIS — M51369 Other intervertebral disc degeneration, lumbar region without mention of lumbar back pain or lower extremity pain: Secondary | ICD-10-CM

## 2014-03-04 DIAGNOSIS — M5136 Other intervertebral disc degeneration, lumbar region: Secondary | ICD-10-CM

## 2014-03-04 DIAGNOSIS — M47816 Spondylosis without myelopathy or radiculopathy, lumbar region: Secondary | ICD-10-CM | POA: Insufficient documentation

## 2014-03-04 HISTORY — PX: TOTAL KNEE REVISION: SHX996

## 2014-03-04 HISTORY — DX: Other intervertebral disc degeneration, lumbar region: M51.36

## 2014-03-04 HISTORY — DX: Other intervertebral disc degeneration, lumbar region without mention of lumbar back pain or lower extremity pain: M51.369

## 2014-03-10 ENCOUNTER — Ambulatory Visit: Payer: PRIVATE HEALTH INSURANCE | Admitting: Podiatry

## 2014-03-18 ENCOUNTER — Ambulatory Visit (INDEPENDENT_AMBULATORY_CARE_PROVIDER_SITE_OTHER): Payer: PRIVATE HEALTH INSURANCE | Admitting: Podiatry

## 2014-03-18 ENCOUNTER — Encounter: Payer: Self-pay | Admitting: Podiatry

## 2014-03-18 ENCOUNTER — Other Ambulatory Visit: Payer: Self-pay | Admitting: Family Medicine

## 2014-03-18 VITALS — BP 114/75 | HR 91 | Ht 66.0 in | Wt 220.0 lb

## 2014-03-18 DIAGNOSIS — M722 Plantar fascial fibromatosis: Secondary | ICD-10-CM

## 2014-03-18 DIAGNOSIS — M79609 Pain in unspecified limb: Secondary | ICD-10-CM

## 2014-03-18 NOTE — Progress Notes (Signed)
Subjective:  Patient presents with pain in left foot at heel area. Pain is worse first thing in the morning and at the end of the day.  Patient requests injection on left heel.   Objective: Pain in left heel.  Enlarged joint 2nd digits bilateral.  Neurovascular status are within normal.   Assessment: Plantar fasciitis left.   Plan: Injection with 4mg  of Dexamethasone and 4mg  of Triamcinolone mixed with 73ml of 1% Xylocaine plain.  Patient will return as needed.  injection on left heel pain. Right knee surgery scheduled for tomorrow.

## 2014-03-18 NOTE — Patient Instructions (Signed)
Seen for pain in left heel. Cortisone injection given.  Return as needed.

## 2014-03-19 ENCOUNTER — Encounter: Payer: Self-pay | Admitting: Family Medicine

## 2014-03-22 ENCOUNTER — Encounter: Payer: Self-pay | Admitting: Family Medicine

## 2014-03-25 ENCOUNTER — Telehealth: Payer: Self-pay | Admitting: Family Medicine

## 2014-03-25 NOTE — Telephone Encounter (Signed)
Diabetic Bundle.  Pt needs diabetic follow up appt in August and recheck LDL.  Left vm to return call.

## 2014-03-27 ENCOUNTER — Other Ambulatory Visit: Payer: Self-pay | Admitting: Family Medicine

## 2014-03-27 NOTE — Telephone Encounter (Signed)
plz phone in. 

## 2014-03-27 NOTE — Telephone Encounter (Signed)
Rx called in as directed.   

## 2014-03-27 NOTE — Telephone Encounter (Signed)
Ok to refill 

## 2014-03-28 ENCOUNTER — Encounter: Payer: Self-pay | Admitting: Family Medicine

## 2014-04-21 ENCOUNTER — Ambulatory Visit (INDEPENDENT_AMBULATORY_CARE_PROVIDER_SITE_OTHER): Payer: PRIVATE HEALTH INSURANCE | Admitting: Internal Medicine

## 2014-04-21 ENCOUNTER — Encounter: Payer: Self-pay | Admitting: Internal Medicine

## 2014-04-21 VITALS — BP 126/88 | HR 85 | Temp 99.0°F | Wt 204.5 lb

## 2014-04-21 DIAGNOSIS — E119 Type 2 diabetes mellitus without complications: Secondary | ICD-10-CM

## 2014-04-21 DIAGNOSIS — E7800 Pure hypercholesterolemia, unspecified: Secondary | ICD-10-CM

## 2014-04-21 DIAGNOSIS — R112 Nausea with vomiting, unspecified: Secondary | ICD-10-CM

## 2014-04-21 DIAGNOSIS — I10 Essential (primary) hypertension: Secondary | ICD-10-CM

## 2014-04-21 LAB — CBC WITH DIFFERENTIAL/PLATELET
Basophils Absolute: 0 10*3/uL (ref 0.0–0.1)
Basophils Relative: 0.6 % (ref 0.0–3.0)
Eosinophils Absolute: 0.2 10*3/uL (ref 0.0–0.7)
Eosinophils Relative: 2.9 % (ref 0.0–5.0)
HCT: 39 % (ref 36.0–46.0)
Hemoglobin: 12.6 g/dL (ref 12.0–15.0)
Lymphocytes Relative: 33.4 % (ref 12.0–46.0)
Lymphs Abs: 2 10*3/uL (ref 0.7–4.0)
MCHC: 32.2 g/dL (ref 30.0–36.0)
MCV: 82.2 fl (ref 78.0–100.0)
Monocytes Absolute: 0.4 10*3/uL (ref 0.1–1.0)
Monocytes Relative: 7.3 % (ref 3.0–12.0)
Neutro Abs: 3.4 10*3/uL (ref 1.4–7.7)
Neutrophils Relative %: 55.8 % (ref 43.0–77.0)
Platelets: 356 10*3/uL (ref 150.0–400.0)
RBC: 4.75 Mil/uL (ref 3.87–5.11)
RDW: 15.6 % — ABNORMAL HIGH (ref 11.5–15.5)
WBC: 6 10*3/uL (ref 4.0–10.5)

## 2014-04-21 LAB — COMPREHENSIVE METABOLIC PANEL
ALT: 16 U/L (ref 0–35)
AST: 19 U/L (ref 0–37)
Albumin: 4 g/dL (ref 3.5–5.2)
Alkaline Phosphatase: 83 U/L (ref 39–117)
BUN: 13 mg/dL (ref 6–23)
CO2: 25 mEq/L (ref 19–32)
Calcium: 9.9 mg/dL (ref 8.4–10.5)
Chloride: 100 mEq/L (ref 96–112)
Creatinine, Ser: 0.8 mg/dL (ref 0.4–1.2)
GFR: 94.36 mL/min (ref >60.00)
Glucose, Bld: 97 mg/dL (ref 70–99)
Potassium: 3.9 mEq/L (ref 3.5–5.1)
Sodium: 139 mEq/L (ref 135–145)
Total Bilirubin: 0.3 mg/dL (ref 0.2–1.2)
Total Protein: 8.6 g/dL — ABNORMAL HIGH (ref 6.0–8.3)

## 2014-04-21 LAB — LIPID PANEL
Cholesterol: 227 mg/dL — ABNORMAL HIGH (ref 0–200)
HDL: 50.5 mg/dL (ref >39.00)
LDL Cholesterol: 141 mg/dL — ABNORMAL HIGH (ref 0–99)
NonHDL: 176.5
Total CHOL/HDL Ratio: 4
Triglycerides: 177 mg/dL — ABNORMAL HIGH (ref 0.0–149.0)
VLDL: 35.4 mg/dL (ref 0.0–40.0)

## 2014-04-21 LAB — HEMOGLOBIN A1C: Hgb A1c MFr Bld: 6.4 % (ref 4.6–6.5)

## 2014-04-21 LAB — T4, FREE: Free T4: 0.95 ng/dL (ref 0.60–1.60)

## 2014-04-21 MED ORDER — GLIPIZIDE ER 5 MG PO TB24: 5.0000 mg | ORAL_TABLET | Freq: Every day | ORAL | Status: DC

## 2014-04-21 NOTE — Progress Notes (Signed)
Pre visit review using our clinic review tool, if applicable. No additional management support is needed unless otherwise documented below in the visit note. 

## 2014-04-21 NOTE — Assessment & Plan Note (Signed)
BP Readings from Last 3 Encounters:  04/21/14 126/88  03/18/14 114/75  12/02/13 110/72   Good control still

## 2014-04-21 NOTE — Patient Instructions (Signed)
Please stop the metformin. Start the glipizide with breakfast daily. Let us know if your stomach symptoms aren't better off the metformin. Please monitor your fasting blood sugars and call if they are regularly over 180

## 2014-04-21 NOTE — Assessment & Plan Note (Signed)
No apparent problems with the statin--but hasn't taken in the past month Will check labs Will need to restart

## 2014-04-21 NOTE — Assessment & Plan Note (Signed)
Has been controlled on metformin but can't tolerate Will change to glipizide I would not recommend victoza at this point  Will send to Dr Darnell Level for review also

## 2014-04-21 NOTE — Assessment & Plan Note (Signed)
Fatigue and metallic taste---seems to be all related to metformin Will stop this Discussed that GI side effects are most common problem with victoza (which she asks for)---so I would not use this May need further eval if symptoms persist

## 2014-04-21 NOTE — Progress Notes (Signed)
Subjective:    Patient ID: Melissa [name], female    DOB: [birth year], 63 y.o.   MRN: [id number]  HPI Having ongoing problems with the metformin Gets nausea and vomiting Diarrhea Has had to take bid due to increased sugars  Feels weak and tired with the metformin Metallic taste in mouth  Tried without the metformin but sugars would go up over 200-- post prandial Doesn't really check fastings much--usually 150 though No hypoglycemic reactions in past few months. Tries to eat three times a day but may get distracted at work (keeps peanut butter nabs for prn)  No problems with crestor Hasn't been taking this since having her knee redone Knows this is not the cause of her symptoms since it has persisted  Current Outpatient Prescriptions on File Prior to Visit  Medication Sig Dispense Refill  . ALPRAZolam (XANAX) 0.25 MG tablet TAKE 1 TABLET BY MOUTH EVERY NIGHT AT BEDTIME  30 tablet  0  . amLODipine (NORVASC) 5 MG tablet Take 1 tablet (5 mg total) by mouth daily.  30 tablet  11  . aspirin EC 81 MG tablet Take 81 mg by mouth daily.      . hydrochlorothiazide (HYDRODIURIL) 12.5 MG tablet Take 1 tablet (12.5 mg total) by mouth daily.  30 tablet  11  . lisinopril (PRINIVIL,ZESTRIL) 20 MG tablet TAKE 1 TABLET BY MOUTH DAILY  30 tablet  11  . loperamide (IMODIUM) 2 MG capsule Take 1 capsule (2 mg total) by mouth 4 (four) times daily as needed for diarrhea or loose stools.  12 capsule  0  . metFORMIN (GLUCOPHAGE) 500 MG tablet TAKE ONE TABLET BY MOUTH ONCE DAILY WITH BREAKFAST  90 tablet  1  . nitroGLYCERIN (NITROSTAT) 0.4 MG SL tablet Place 1 tablet (0.4 mg total) under the tongue every 5 (five) minutes as needed for chest pain. If used seek urgent care  30 tablet  0  . ondansetron (ZOFRAN) 4 MG tablet Take 1 tablet (4 mg total) by mouth every 8 (eight) hours as needed for nausea.  12 tablet  0  . Probiotic Product (ALIGN) 4 MG CAPS Take 1 capsule (4 mg total) by mouth daily.  7 capsule  0    . rosuvastatin (CRESTOR) 40 MG tablet 1/2 tablet q hs  15 tablet  11  . traMADol (ULTRAM) 50 MG tablet Take 1 tablet (50 mg total) by mouth every 12 (twelve) hours as needed for moderate pain.  15 tablet  0  . trolamine salicylate (ASPERCREME) 10 % cream Apply 1 application topically as needed. Applies to legs for aches and pain       No current facility-administered medications on file prior to visit.    Allergies  Allergen Reactions  . Codeine Other (See Comments)    palpitations  . Oxycodone Nausea And Vomiting    Past Medical History  Diagnosis Date  . HLD (hyperlipidemia)   . Hypertension   . Glaucoma   . Depression with anxiety   . Arthritis     s/p R TKR  . Diabetes type 2, controlled 2011    borderline  . History of ulcer disease   . Chest pain on exertion   . Dyspnea   . Cyst of left kidney 2015    by lumbar MRI pending renal US  . DDD (degenerative disc disease), lumbar 03/2014    severe L2-3 with L HNP with L2/3 nerve root impingement [name])    Past Surgical History  Procedure Laterality Date  . Total knee arthroplasty Right 2004    TKR (Dr. Eddie Dibbles with [facility])  . Abdominal hysterectomy  2007    fibroids, heavy bleeding  . Total knee revision Right 03/2014    Dr Al Corpus [facility]    Family History  Problem Relation Age of Onset  . Cancer Maternal Grandmother 34    ovarian  . Diabetes Maternal Grandmother   . Hypertension Maternal Grandmother   . Cancer Mother 12    bone, MM  . Stroke Other     unsure who  . CAD Neg Hx     History   Social History  . Marital Status: Married    Spouse Name: N/A    Number of Children: 0  . Years of Education: N/A   Occupational History  . work with disabilities    Social History Main Topics  . Smoking status: Never Smoker   . Smokeless tobacco: Never Used  . Alcohol Use: No  . Drug Use: No  . Sexual Activity: Not Currently    Birth Control/ Protection: Surgical   Other Topics Concern  . Not on file    Social History Narrative   Caffeine: rare   Lives alone   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[employer]   Edu: college   Activity: no regular exercise   Diet: some water, fruits/vegetables occasionally   Review of Systems No chest pain No SOB---occasionally has to take sighing breath (relates to her anxiety) Generally sleeps okay Hopes to go back to work soon Has lost weight due to feeling bad    Objective:   Physical Exam  Constitutional: She appears well-developed and well-nourished. No distress.  Neck: Normal range of motion. Neck supple. No thyromegaly present.  Cardiovascular: Normal rate, regular rhythm and normal heart sounds.  Exam reveals no gallop.   No murmur heard. Pulmonary/Chest: Effort normal and breath sounds normal. No respiratory distress. She has no wheezes. She has no rales.  Abdominal: Soft. Bowel sounds are normal. She exhibits no distension. There is no tenderness. There is no rebound and no guarding.  Musculoskeletal: She exhibits no edema and no tenderness.  Lymphadenopathy:    She has no cervical adenopathy.  Psychiatric: She has a normal mood and affect. Her behavior is normal.          Assessment & Plan:

## 2014-04-24 ENCOUNTER — Encounter: Payer: Self-pay | Admitting: *Deleted

## 2014-04-30 ENCOUNTER — Other Ambulatory Visit: Payer: Self-pay | Admitting: Family Medicine

## 2014-04-30 NOTE — Telephone Encounter (Signed)
Ok to refill 

## 2014-04-30 NOTE — Telephone Encounter (Signed)
plz phone in. 

## 2014-04-30 NOTE — Telephone Encounter (Signed)
Rx called in as directed.   

## 2014-05-04 ENCOUNTER — Encounter: Payer: Self-pay | Admitting: Podiatry

## 2014-05-04 ENCOUNTER — Ambulatory Visit (INDEPENDENT_AMBULATORY_CARE_PROVIDER_SITE_OTHER): Payer: PRIVATE HEALTH INSURANCE | Admitting: Podiatry

## 2014-05-04 VITALS — BP 118/79 | HR 78

## 2014-05-04 DIAGNOSIS — M25579 Pain in unspecified ankle and joints of unspecified foot: Secondary | ICD-10-CM

## 2014-05-04 DIAGNOSIS — S99929A Unspecified injury of unspecified foot, initial encounter: Secondary | ICD-10-CM

## 2014-05-04 DIAGNOSIS — S99919A Unspecified injury of unspecified ankle, initial encounter: Secondary | ICD-10-CM

## 2014-05-04 DIAGNOSIS — S8990XA Unspecified injury of unspecified lower leg, initial encounter: Secondary | ICD-10-CM

## 2014-05-04 DIAGNOSIS — M25572 Pain in left ankle and joints of left foot: Secondary | ICD-10-CM | POA: Insufficient documentation

## 2014-05-04 DIAGNOSIS — S99912A Unspecified injury of left ankle, initial encounter: Secondary | ICD-10-CM | POA: Insufficient documentation

## 2014-05-04 NOTE — Progress Notes (Signed)
Subjective: Had right knee surgery done in July 2015. Had left ankle twisted and been hurting since last Friday.  Pain at medial ankle left. Patient is wondering if injection would help. Already has ankle brace at home.  Objective: No edema or erythema noted. Only pain upon eversion of the ankle joint. No pain with inversion of ankle or dorsiflexion and plantar flexion of left ankle.   Assessment: Strained medial deltoid ligament.  Plan: Will benefit with ankle brace in lace up tennis shoes. Return as needed.

## 2014-05-04 NOTE — Patient Instructions (Signed)
Seen for strained left ankle.  No edema or erythema noted. Pain at the medial ankle ligament when stretched. Will benefit with ankle brace in lace up tennis shoes. Return as needed.

## 2014-05-05 ENCOUNTER — Ambulatory Visit: Payer: PRIVATE HEALTH INSURANCE | Admitting: Family Medicine

## 2014-05-28 ENCOUNTER — Other Ambulatory Visit: Payer: Self-pay | Admitting: Family Medicine

## 2014-05-29 NOTE — Telephone Encounter (Signed)
plz phone in. 

## 2014-05-29 NOTE — Telephone Encounter (Signed)
Rx called in to pharmacy. 

## 2014-06-15 ENCOUNTER — Telehealth: Payer: Self-pay | Admitting: Family Medicine

## 2014-06-15 NOTE — Telephone Encounter (Signed)
Pt states she needs a prescription for her diabetes she needs her diabetes medication (Glucotrol XL) written for her to take as needed.  She has been running out at the end of the month.  She also wants her Lisinopril and HCTZ written the same way.  She also named another medication that is for her blood pressure that starts with an A but I am not sure if she is talking about Alprazolam.  Please call pt  [facility] (corner of [address] and [address])

## 2014-06-15 NOTE — Telephone Encounter (Signed)
Pt returned your call. Please call pt back at [phone]

## 2014-06-15 NOTE — Telephone Encounter (Signed)
Message left for patient to return my call and give more details. She shouldn't be running out early with a 30 day supply if she is taking med correctly. Will await return call.

## 2014-06-15 NOTE — Telephone Encounter (Signed)
Message left for patient to return my call.  

## 2014-06-16 NOTE — Telephone Encounter (Signed)
Will see her when she schedules appt.

## 2014-06-16 NOTE — Telephone Encounter (Signed)
Spoke with patient and she is sometimes requiring extra pills due to her BP or sugar not being in range and feeling bad because of it. I advised that she would need to schedule an appointment to discuss this because her meds may need to have the dose increased or changed altogether. She verbalized understanding and said she would call tomorrow to schedule when she can talk to her boss about an appointment.

## 2014-06-26 ENCOUNTER — Other Ambulatory Visit: Payer: Self-pay | Admitting: Family Medicine

## 2014-06-29 NOTE — Telephone Encounter (Signed)
Rx called in as directed.   

## 2014-06-29 NOTE — Telephone Encounter (Signed)
plz phone in. 

## 2014-07-03 ENCOUNTER — Other Ambulatory Visit: Payer: Self-pay | Admitting: Family Medicine

## 2014-07-06 ENCOUNTER — Other Ambulatory Visit: Payer: Self-pay | Admitting: Cardiology

## 2014-07-08 ENCOUNTER — Other Ambulatory Visit: Payer: Self-pay | Admitting: Cardiology

## 2014-07-27 ENCOUNTER — Other Ambulatory Visit: Payer: Self-pay

## 2014-07-27 NOTE — Telephone Encounter (Signed)
Pt left v/m requesting refill glipizide to [facility] [address]; pt out of med; pt should have available refills; spoke with Iona Beard pharmacist at [facility] [address]; Iona Beard said pt has gotten #150 of glipizide since 04/2014. Pt told pharmacist that her doctor had doubled her glipizide and pt is supposed to take 2 glipizide daily; cannot find documented in chart where pt was advised to take glipizide two tabs daily. Unable to reach pt by phone. Pt already has med refill appt scheduled for 07/29/14.Please advise.

## 2014-07-28 ENCOUNTER — Encounter (HOSPITAL_COMMUNITY): Payer: Self-pay | Admitting: Emergency Medicine

## 2014-07-28 ENCOUNTER — Emergency Department (INDEPENDENT_AMBULATORY_CARE_PROVIDER_SITE_OTHER)
Admission: EM | Admit: 2014-07-28 | Discharge: 2014-07-28 | Disposition: A | Discharge status: Home / Self Care | Payer: PRIVATE HEALTH INSURANCE | Source: Home / Self Care | Attending: Family Medicine | Admitting: Family Medicine

## 2014-07-28 DIAGNOSIS — J069 Acute upper respiratory infection, unspecified: Secondary | ICD-10-CM

## 2014-07-28 MED ORDER — GLIPIZIDE ER 5 MG PO TB24: 10.0000 mg | ORAL_TABLET | Freq: Every day | ORAL | Status: DC

## 2014-07-28 MED ORDER — AZITHROMYCIN 250 MG PO TABS: ORAL_TABLET | ORAL | Status: DC

## 2014-07-28 MED ORDER — IPRATROPIUM BROMIDE 0.06 % NA SOLN: 2.0000 | Freq: Four times a day (QID) | NASAL | Status: DC

## 2014-07-28 NOTE — Telephone Encounter (Signed)
Have doubled glipizide ER to 10mg  daily and sent in 1 month supply to pharmacy. Will see her and discuss this tomorrow.

## 2014-07-28 NOTE — ED Provider Notes (Signed)
CSN: [id number]     Arrival date & time 07/28/14  0810 History   First MD Initiated Contact with Patient 07/28/14 [phone]     Chief Complaint  Patient presents with  . URI   (Consider location/radiation/quality/duration/timing/severity/associated sxs/prior Treatment) Patient is a 63 y.o. female presenting with URI.  URI Presenting symptoms: congestion, cough, rhinorrhea and sore throat   Presenting symptoms: no fever   Severity:  Mild Duration:  6 days Progression:  Unchanged Chronicity:  New Ineffective treatments:  OTC medications   Past Medical History  Diagnosis Date  . HLD (hyperlipidemia)   . Hypertension   . Glaucoma   . Depression with anxiety   . Arthritis     s/p R TKR  . Diabetes type 2, controlled 2011    borderline  . History of ulcer disease   . Chest pain on exertion   . Dyspnea   . Cyst of left kidney 2015    by lumbar MRI pending renal US  . DDD (degenerative disc disease), lumbar 03/2014    severe L2-3 with L HNP with L2/3 nerve root impingement [name])   Past Surgical History  Procedure Laterality Date  . Total knee arthroplasty Right 2004    TKR (Dr. Eddie Dibbles with [facility])  . Abdominal hysterectomy  2007    fibroids, heavy bleeding  . Total knee revision Right 03/2014    Dr Al Corpus [facility]   Family History  Problem Relation Age of Onset  . Cancer Maternal Grandmother 71    ovarian  . Diabetes Maternal Grandmother   . Hypertension Maternal Grandmother   . Cancer Mother 18    bone, MM  . Stroke Other     unsure who  . CAD Neg Hx    History  Substance Use Topics  . Smoking status: Never Smoker   . Smokeless tobacco: Never Used  . Alcohol Use: No   OB History    Gravida Para Term Preterm AB TAB SAB Ectopic Multiple Living   0         0     Review of Systems  Constitutional: Negative.  Negative for fever.  HENT: Positive for congestion, postnasal drip, rhinorrhea and sore throat.   Respiratory: Positive for cough.   Gastrointestinal:  Negative.     Allergies  Codeine and Oxycodone  Home Medications   Prior to Admission medications   Medication Sig Start Date End Date Taking? Authorizing Provider  amLODipine (NORVASC) 5 MG tablet Take 1 tablet (5 mg total) by mouth daily. 11/05/13  Yes Peter M Martinique, MD  aspirin EC 81 MG tablet Take 81 mg by mouth daily.   Yes Historical Provider, MD  glipiZIDE (GLUCOTROL XL) 5 MG 24 hr tablet Take 2 tablets (10 mg total) by mouth daily with breakfast. 07/28/14  Yes Ria Bush, MD  lisinopril (PRINIVIL,ZESTRIL) 20 MG tablet TAKE 1 TABLET BY MOUTH DAILY 11/05/13  Yes Peter M Martinique, MD  ALPRAZolam Duanne Moron) 0.25 MG tablet TAKE 1 TABLET BY MOUTH AT BEDTIME AS NEEDED 06/29/14   Ria Bush, MD  azithromycin (ZITHROMAX Z-PAK) 250 MG tablet Take as directed on pack 07/28/14   Billy Fischer, MD  CRESTOR 40 MG tablet TAKE 1/2 TABLET BY MOUTH EVERY NIGHT AT BEDTIME 05/29/14   Ria Bush, MD  hydrochlorothiazide (HYDRODIURIL) 12.5 MG tablet TAKE 1 TABLET BY MOUTH DAILY 07/03/14   Ria Bush, MD  ipratropium (ATROVENT) 0.06 % nasal spray Place 2 sprays into both nostrils 4 (four) times  daily. 07/28/14   Billy Fischer, MD  loperamide (IMODIUM) 2 MG capsule Take 1 capsule (2 mg total) by mouth 4 (four) times daily as needed for diarrhea or loose stools. 12/02/13   Audelia Hives Presson, PA  nitroGLYCERIN (NITROSTAT) 0.4 MG SL tablet Place 1 tablet (0.4 mg total) under the tongue every 5 (five) minutes as needed for chest pain. If used seek urgent care 10/06/13   Ria Bush, MD  ondansetron (ZOFRAN) 4 MG tablet Take 1 tablet (4 mg total) by mouth every 8 (eight) hours as needed for nausea. 12/02/13   Lutricia Feil, PA  Probiotic Product (ALIGN) 4 MG CAPS Take 1 capsule (4 mg total) by mouth daily. 12/02/13   Audelia Hives Presson, PA  traMADol (ULTRAM) 50 MG tablet Take 1 tablet (50 mg total) by mouth every 12 (twelve) hours as needed for moderate pain. 12/02/13   Audelia Hives Presson, PA  trolamine salicylate (ASPERCREME) 10 % cream Apply 1 application topically as needed. Applies to legs for aches and pain    Historical Provider, MD   BP 114/83 mmHg  Pulse 85  Temp(Src) 99.2 F (37.3 C) (Oral)  Resp 16  SpO2 97% Physical Exam  Constitutional: She is oriented to person, place, and time. She appears well-developed and well-nourished.  HENT:  Head: Normocephalic.  Right Ear: External ear normal.  Left Ear: External ear normal.  Nose: Mucosal edema and rhinorrhea present.  Mouth/Throat: Oropharynx is clear and moist.  Neck: Normal range of motion. Neck supple.  Cardiovascular: Normal rate, regular rhythm, normal heart sounds and intact distal pulses.   Pulmonary/Chest: Effort normal and breath sounds normal.  Lymphadenopathy:    She has no cervical adenopathy.  Neurological: She is alert and oriented to person, place, and time.  Skin: Skin is warm and dry.  Nursing note and vitals reviewed.   ED Course  Procedures (including critical care time) Labs Review Labs Reviewed - No data to display  Imaging Review No results found.   MDM   1. URI (upper respiratory infection)       Billy Fischer, MD 07/28/14 [phone]

## 2014-07-28 NOTE — Discharge Instructions (Signed)
Drink plenty of fluids as discussed, use medicine as prescribed, and mucinex or delsym for cough. Return or see your doctor if further problems °

## 2014-07-28 NOTE — ED Notes (Signed)
C/o chest congestion.  Productive cough comes and goes. Chest soreness.  Sob.   Denies fever, d/n/v.  Symptoms present since last Wednesday.  No relief with otc meds.

## 2014-07-29 ENCOUNTER — Ambulatory Visit: Payer: PRIVATE HEALTH INSURANCE | Admitting: Family Medicine

## 2014-08-05 ENCOUNTER — Ambulatory Visit (INDEPENDENT_AMBULATORY_CARE_PROVIDER_SITE_OTHER): Payer: PRIVATE HEALTH INSURANCE | Admitting: Family Medicine

## 2014-08-05 ENCOUNTER — Encounter: Payer: Self-pay | Admitting: Family Medicine

## 2014-08-05 VITALS — BP 128/84 | HR 84 | Temp 98.5°F | Wt 209.5 lb

## 2014-08-05 DIAGNOSIS — E7800 Pure hypercholesterolemia, unspecified: Secondary | ICD-10-CM

## 2014-08-05 DIAGNOSIS — F32A Depression, unspecified: Secondary | ICD-10-CM

## 2014-08-05 DIAGNOSIS — F419 Anxiety disorder, unspecified: Secondary | ICD-10-CM

## 2014-08-05 DIAGNOSIS — F418 Other specified anxiety disorders: Secondary | ICD-10-CM

## 2014-08-05 DIAGNOSIS — I10 Essential (primary) hypertension: Secondary | ICD-10-CM

## 2014-08-05 DIAGNOSIS — F329 Major depressive disorder, single episode, unspecified: Secondary | ICD-10-CM

## 2014-08-05 DIAGNOSIS — E119 Type 2 diabetes mellitus without complications: Secondary | ICD-10-CM

## 2014-08-05 DIAGNOSIS — Z23 Encounter for immunization: Secondary | ICD-10-CM

## 2014-08-05 MED ORDER — ALPRAZOLAM 0.25 MG PO TABS: 0.2500 mg | ORAL_TABLET | Freq: Every evening | ORAL | Status: DC | PRN

## 2014-08-05 MED ORDER — LISINOPRIL 20 MG PO TABS: ORAL_TABLET | ORAL | Status: DC

## 2014-08-05 MED ORDER — GLIPIZIDE ER 5 MG PO TB24: 5.0000 mg | ORAL_TABLET | Freq: Every day | ORAL | Status: DC

## 2014-08-05 MED ORDER — HYDROCHLOROTHIAZIDE 12.5 MG PO TABS: 12.5000 mg | ORAL_TABLET | Freq: Every day | ORAL | Status: DC

## 2014-08-05 MED ORDER — AMLODIPINE BESYLATE 5 MG PO TABS: 5.0000 mg | ORAL_TABLET | Freq: Every day | ORAL | Status: DC

## 2014-08-05 MED ORDER — ATORVASTATIN CALCIUM 40 MG PO TABS: 40.0000 mg | ORAL_TABLET | Freq: Every day | ORAL | Status: DC

## 2014-08-05 NOTE — Assessment & Plan Note (Signed)
H/o anxiety/panic attacks- refilled xanax.

## 2014-08-05 NOTE — Progress Notes (Signed)
Pre visit review using our clinic review tool, if applicable. No additional management support is needed unless otherwise documented below in the visit note. 

## 2014-08-05 NOTE — Progress Notes (Signed)
BP 128/84 mmHg  Pulse 84  Temp(Src) 98.5 F (36.9 C) (Oral)  Wt 209 lb 8 oz (95.029 kg)   CC: med refill  Subjective:    Patient ID: Melissa [name], female    DOB: [birth year], 63 y.o.   MRN: [id number]  HPI: Melissa [name] is a 63 y.o. female presenting on 08/05/2014 for Medication Refill   Last seen here 10/2013.  Recovering from recent knee surgery. Increased stress recently.   On alprazolam prn anxiety and panic disorder.   Recently seen at [facility] with URI, treated with zpack. Also using nasal spray and mucinex DM. sxs improving.   DM - Checks nightly and as needed. Doesn't check fasting. Last 2 months sugars elevated 200s. The last few days 120-150s. Denies paresthesias. Compliant with glipizide ER 5-10mg  with breakfast. No low sugars.  Lab Results  Component Value Date   HGBA1C 6.4 04/21/2014   Lab Results  Component Value Date   CREATININE 0.8 04/21/2014    HTN - Compliant with current antihypertensive regimen of lisinopril 20mg  PM, hydrochlorothiazide 12.5mg  AM, and amlodipine 5mg  daily. Does check blood pressures at home (bp cuff broke at home). No low blood pressure symptoms of dizziness/syncope. Denies HA, vision changes.  Lab Results  Component Value Date   LDLCALC 141* 04/21/2014  HLD - off crestor for last several months.  However never myalgias with statin.  Relevant past medical, surgical, family and social history reviewed and updated as indicated. Interim medical history since our last visit reviewed. Allergies and medications reviewed and updated.  Current Outpatient Prescriptions on File Prior to Visit  Medication Sig  . aspirin EC 81 MG tablet Take 81 mg by mouth daily.  [name] ipratropium (ATROVENT) 0.06 % nasal spray Place 2 sprays into both nostrils 4 (four) times daily.  [name] loperamide (IMODIUM) 2 MG capsule Take 1 capsule (2 mg total) by mouth 4 (four) times daily as needed for diarrhea or loose stools.  . Probiotic Product (ALIGN) 4 MG CAPS  Take 1 capsule (4 mg total) by mouth daily.   No current facility-administered medications on file prior to visit.    Review of Systems Per HPI unless specifically indicated above     Objective:    BP 128/84 mmHg  Pulse 84  Temp(Src) 98.5 F (36.9 C) (Oral)  Wt 209 lb 8 oz (95.029 kg)  Wt Readings from Last 3 Encounters:  08/05/14 209 lb 8 oz (95.029 kg)  04/21/14 204 lb 8 oz (92.761 kg)  03/18/14 220 lb (99.791 kg)    Physical Exam  Constitutional: She appears well-developed and well-nourished. No distress.  HENT:  Head: Normocephalic and atraumatic.  Right Ear: External ear normal.  Left Ear: External ear normal.  Nose: Nose normal.  Mouth/Throat: Oropharynx is clear and moist. No oropharyngeal exudate.  Eyes: Conjunctivae and EOM are normal. Pupils are equal, round, and reactive to light. No scleral icterus.  Neck: Normal range of motion. Neck supple. No thyromegaly present.  Cardiovascular: Normal rate, regular rhythm, normal heart sounds and intact distal pulses.   No murmur heard. Pulmonary/Chest: Effort normal and breath sounds normal. No respiratory distress. She has no wheezes. She has no rales.  Musculoskeletal: She exhibits no edema.  Diabetic foot exam: Normal inspection No skin breakdown No calluses  Normal DP/PT pulses Normal sensation to light tough and monofilament Nails normal  Lymphadenopathy:    She has no cervical adenopathy.  Skin: Skin is warm and dry. No rash noted.  Psychiatric:  She has a normal mood and affect.  Nursing note and vitals reviewed.  Results for orders placed or performed in visit on 04/21/14  Comprehensive metabolic panel  Result Value Ref Range   Sodium 139 135 - 145 mEq/L   Potassium 3.9 3.5 - 5.1 mEq/L   Chloride 100 96 - 112 mEq/L   CO2 25 19 - 32 mEq/L   Glucose, Bld 97 70 - 99 mg/dL   BUN 13 6 - 23 mg/dL   Creatinine, Ser 0.8 0.4 - 1.2 mg/dL   Total Bilirubin 0.3 0.2 - 1.2 mg/dL   Alkaline Phosphatase 83 39 - 117  U/L   AST 19 0 - 37 U/L   ALT 16 0 - 35 U/L   Total Protein 8.6 (H) 6.0 - 8.3 g/dL   Albumin 4.0 3.5 - 5.2 g/dL   Calcium 9.9 8.4 - 10.5 mg/dL   GFR 94.36 >60.00 mL/min  CBC with Differential  Result Value Ref Range   WBC 6.0 4.0 - 10.5 K/uL   RBC 4.75 3.87 - 5.11 Mil/uL   Hemoglobin 12.6 12.0 - 15.0 g/dL   HCT 39.0 36.0 - 46.0 %   MCV 82.2 78.0 - 100.0 fl   MCHC 32.2 30.0 - 36.0 g/dL   RDW 15.6 (H) 11.5 - 15.5 %   Platelets 356.0 150.0 - 400.0 K/uL   Neutrophils Relative % 55.8 43.0 - 77.0 %   Lymphocytes Relative 33.4 12.0 - 46.0 %   Monocytes Relative 7.3 3.0 - 12.0 %   Eosinophils Relative 2.9 0.0 - 5.0 %   Basophils Relative 0.6 0.0 - 3.0 %   Neutro Abs 3.4 1.4 - 7.7 K/uL   Lymphs Abs 2.0 0.7 - 4.0 K/uL   Monocytes Absolute 0.4 0.1 - 1.0 K/uL   Eosinophils Absolute 0.2 0.0 - 0.7 K/uL   Basophils Absolute 0.0 0.0 - 0.1 K/uL  T4, free  Result Value Ref Range   Free T4 0.95 0.60 - 1.60 ng/dL  Lipid panel  Result Value Ref Range   Cholesterol 227 (H) 0 - 200 mg/dL   Triglycerides 177.0 (H) 0.0 - 149.0 mg/dL   HDL 50.50 >39.00 mg/dL   VLDL 35.4 0.0 - 40.0 mg/dL   LDL Cholesterol 141 (H) 0 - 99 mg/dL   Total CHOL/HDL Ratio 4    NonHDL 176.50   Hemoglobin A1c  Result Value Ref Range   Hgb A1c MFr Bld 6.4 4.6 - 6.5 %      Assessment & Plan:   Problem List Items Addressed This Visit    Type 2 diabetes mellitus, controlled    Metformin untolerable. Started on glipizide XR.  Reviewed recall cbg's, discussed starting 5-10mg  QAM, if takes 5mg  in am, ok to take 5mg  at night if sugars remain elevated. rec schedule eye exam as due.    Relevant Medications      glipiZIDE (GLUCOTROL XL) 24 hr tablet      lisinopril (PRINIVIL,ZESTRIL) tablet      atorvastatin (LIPITOR) tablet   Other Relevant Orders      Microalbumin / creatinine urine ratio   Hypercholesteremia    Has not taken statin recently. Sent in lipitor generic 40mg  daily to start taking.  Check FLP next  visit.    Relevant Medications      amLODIpine (NORVASC) tablet      hydrochlorothiazide (HYDRODIURIL) tablet      lisinopril (PRINIVIL,ZESTRIL) tablet      atorvastatin (LIPITOR) tablet   HTN (hypertension) - Primary (Chronic)  Chronic, stable. Continue regimen.    Relevant Medications      amLODIpine (NORVASC) tablet      hydrochlorothiazide (HYDRODIURIL) tablet      lisinopril (PRINIVIL,ZESTRIL) tablet      atorvastatin (LIPITOR) tablet   Anxiety and depression    H/o anxiety/panic attacks- refilled xanax.        Follow up plan: Return in about 6 months (around 02/04/2015), or as needed, for annual exam, prior fasting for blood work.

## 2014-08-05 NOTE — Assessment & Plan Note (Signed)
Metformin untolerable. Started on glipizide XR.  Reviewed recall cbg's, discussed starting 5-10mg  QAM, if takes 5mg  in am, ok to take 5mg  at night if sugars remain elevated. rec schedule eye exam as due.

## 2014-08-05 NOTE — Patient Instructions (Addendum)
Pneumovax. Continue medicines as up to now - I've refilled them for you. Good to see you today, return to see me in 6 months for physical. Urine checked today (microalbumin).

## 2014-08-05 NOTE — Assessment & Plan Note (Signed)
Chronic, stable. Continue regimen. 

## 2014-08-05 NOTE — Addendum Note (Signed)
Addended by: Royann Shivers A on: 08/05/2014 10:11 AM   Modules accepted: Orders

## 2014-08-05 NOTE — Assessment & Plan Note (Signed)
Has not taken statin recently. Sent in lipitor generic 40mg  daily to start taking.  Check FLP next visit.

## 2014-08-06 ENCOUNTER — Encounter: Payer: Self-pay | Admitting: *Deleted

## 2014-08-06 LAB — MICROALBUMIN / CREATININE URINE RATIO
Creatinine,U: 112.6 mg/dL
Microalb Creat Ratio: 0.6 mg/g (ref 0.0–30.0)
Microalb, Ur: 0.7 mg/dL (ref 0.0–1.9)

## 2014-08-18 ENCOUNTER — Telehealth: Payer: Self-pay

## 2014-08-18 NOTE — Telephone Encounter (Signed)
Pt said for 2 months had been coughing; pt was seen at [facility] and given abx but cough has never gone away; pt saw Dr Darnell Level 08/05/14 about different issue and cough seemed slightly better but now pt cannot rest at night due to cough.pt has prod cough with yellow phlegm, no wheezing or SOB. No fever.Mucinex DM is not getting rid of cough. Pt is working and does not want to scheduled appt. Pt request med sent to [facility] [address] and [place]. Pt request cb.

## 2014-08-19 MED ORDER — BENZONATATE 100 MG PO CAPS: 100.0000 mg | ORAL_CAPSULE | Freq: Two times a day (BID) | ORAL | Status: DC | PRN

## 2014-08-19 MED ORDER — AZITHROMYCIN 250 MG PO TABS: ORAL_TABLET | ORAL | Status: DC

## 2014-08-19 NOTE — Telephone Encounter (Signed)
Patient notified and verbalized understanding. 

## 2014-08-19 NOTE — Telephone Encounter (Signed)
May do another zpack and tessalon perls (swallow don't chew). If cough not improved with this treatment will recommend office visit to evaluate, may need xray.

## 2014-09-08 ENCOUNTER — Ambulatory Visit (INDEPENDENT_AMBULATORY_CARE_PROVIDER_SITE_OTHER): Payer: PRIVATE HEALTH INSURANCE | Admitting: Family Medicine

## 2014-09-08 ENCOUNTER — Encounter: Payer: Self-pay | Admitting: Family Medicine

## 2014-09-08 VITALS — BP 126/84 | HR 64 | Temp 98.5°F | Wt 214.5 lb

## 2014-09-08 DIAGNOSIS — I10 Essential (primary) hypertension: Secondary | ICD-10-CM

## 2014-09-08 MED ORDER — BENZONATATE 100 MG PO CAPS: 100.0000 mg | ORAL_CAPSULE | Freq: Two times a day (BID) | ORAL | Status: DC | PRN

## 2014-09-08 MED ORDER — LISINOPRIL-HYDROCHLOROTHIAZIDE 20-12.5 MG PO TABS: 1.0000 | ORAL_TABLET | Freq: Every day | ORAL | Status: DC

## 2014-09-08 NOTE — Patient Instructions (Signed)
Let's stop separate pills of lisinopril and hydrochlorothiazide, and start combo pill lisinopril hctz 20.12.5mg  every morning. Continue amlodipine 5mg  every evening. If blood pressure remains elevated 2 hours after amlodipine at night, may take extra lisinopril 20mg  at bedtime. Update me in 2-3 weeks with how blood pressures are running with this regimen. Good to see you today.

## 2014-09-08 NOTE — Progress Notes (Signed)
BP 126/84 mmHg  Pulse 64  Temp(Src) 98.5 F (36.9 C) (Oral)  Wt 214 lb 8 oz (97.297 kg)   CC: check bp  Subjective:    Patient ID: Melissa [name], female    DOB: 16-Jun-1951, 64 y.o.   MRN: [id number]  HPI: Melissa [name] is a 64 y.o. female presenting on 09/08/2014 for Hypertension   HTN - Compliant with current antihypertensive regimen of lisinopril 20mg  AM, hydrochlorothiazide 12.5mg  AM, and amlodipine 5mg  daily PM. Does check blood pressures at home. No low blood pressure symptoms of dizziness/syncope. Denies HA, vision changes, chest pain or tightness or shortness of breath.  Over last few weeks noticing elevated blood pressure readings at night time - diastolics up to 31-517. Feels ill with higher dose. Has been taking extra amlodipine daily, occasionally extra lisinopril. Systolics stay below 616 consistently. She has not been regular with meds, sometimes takes extra doses when she is concerned about elevated blood pressures.   Relevant past medical, surgical, family and social history reviewed and updated as indicated. Interim medical history since our last visit reviewed. Allergies and medications reviewed and updated. Current Outpatient Prescriptions on File Prior to Visit  Medication Sig  . ALPRAZolam (XANAX) 0.25 MG tablet Take 1 tablet (0.25 mg total) by mouth at bedtime as needed.  [name] aspirin EC 81 MG tablet Take 81 mg by mouth daily.  [name] atorvastatin (LIPITOR) 40 MG tablet Take 1 tablet (40 mg total) by mouth daily.  [name] glipiZIDE (GLUCOTROL XL) 5 MG 24 hr tablet Take 1-2 tablets (5-10 mg total) by mouth daily with breakfast.  . Probiotic Product (ALIGN) 4 MG CAPS Take 1 capsule (4 mg total) by mouth daily.   No current facility-administered medications on file prior to visit.    Review of Systems Per HPI unless specifically indicated above     Objective:    BP 126/84 mmHg  Pulse 64  Temp(Src) 98.5 F (36.9 C) (Oral)  Wt 214 lb 8 oz (97.297 kg)  Wt  Readings from Last 3 Encounters:  09/08/14 214 lb 8 oz (97.297 kg)  08/05/14 209 lb 8 oz (95.029 kg)  04/21/14 204 lb 8 oz (92.761 kg)    Physical Exam  Constitutional: She appears well-developed and well-nourished. No distress.  HENT:  Mouth/Throat: Oropharynx is clear and moist. No oropharyngeal exudate.  Cardiovascular: Normal rate, regular rhythm, normal heart sounds and intact distal pulses.   No murmur heard. Pulmonary/Chest: Effort normal and breath sounds normal. No respiratory distress. She has no wheezes. She has no rales.  Musculoskeletal: She exhibits no edema.  Psychiatric: She has a normal mood and affect.  Nursing note and vitals reviewed.  Results for orders placed or performed in visit on 08/05/14  Microalbumin / creatinine urine ratio  Result Value Ref Range   Microalb, Ur 0.7 0.0 - 1.9 mg/dL   Creatinine,U 112.6 mg/dL   Microalb Creat Ratio 0.6 0.0 - 30.0 mg/g      Assessment & Plan:   Problem List Items Addressed This Visit    HTN (hypertension) - Primary (Chronic)    Good control in office but pt endorses elevated diastolics especially at night time. She has not been regular with her med regimen, and occasionally skips pills then takes double later on. Discussed reasons to be regular with regimen. Pt will start lisinopril hctz 20/12.5mg  every morning and amlodipine 5mg  every evening, and will be regular with administration and monitor bp control on regular regimen. If bp persistently elevated  at night time, ok to take extra lisinopril 20mg  at night time. I asked her to call us with update on bp's in 2 wks, and return in 2-3 mo for CPE. Pt agrees with plan.     Relevant Medications      amLODIpine (NORVASC) tablet      LISINOPRIL-HCTZ 20-12.5 MG PO TABS      lisinopril (PRINIVIL,ZESTRIL) tablet       Follow up plan: Return if symptoms worsen or fail to improve.

## 2014-09-08 NOTE — Progress Notes (Signed)
Pre visit review using our clinic review tool, if applicable. No additional management support is needed unless otherwise documented below in the visit note. 

## 2014-09-08 NOTE — Assessment & Plan Note (Signed)
Good control in office but pt endorses elevated diastolics especially at night time. She has not been regular with her med regimen, and occasionally skips pills then takes double later on. Discussed reasons to be regular with regimen. Pt will start lisinopril hctz 20/12.5mg  every morning and amlodipine 5mg  every evening, and will be regular with administration and monitor bp control on regular regimen. If bp persistently elevated at night time, ok to take extra lisinopril 20mg  at night time. I asked her to call us with update on bp's in 2 wks, and return in 2-3 mo for CPE. Pt agrees with plan.

## 2014-09-16 ENCOUNTER — Telehealth: Payer: Self-pay | Admitting: Cardiology

## 2014-09-16 NOTE — Telephone Encounter (Signed)
Please call,she needs a letter for her insurance company.

## 2014-09-16 NOTE — Telephone Encounter (Signed)
Returned call to patient she stated she needed a letter stating her heart is normal for life insurance.Advised Dr.Jordan out of office today.Advised will call back 09/17/14.

## 2014-09-17 ENCOUNTER — Ambulatory Visit: Payer: PRIVATE HEALTH INSURANCE | Admitting: Nurse Practitioner

## 2014-09-17 NOTE — Telephone Encounter (Signed)
Patient called no answer.LMTC. 

## 2014-09-18 NOTE — Telephone Encounter (Signed)
Received a call from patient.Dr.Jordan signed letter for life insurance.Letter left at front desk of [facility] office for pick up.

## 2014-10-05 ENCOUNTER — Other Ambulatory Visit: Payer: Self-pay | Admitting: Family Medicine

## 2014-10-05 NOTE — Telephone Encounter (Signed)
plz phone in xanax. 

## 2014-10-06 ENCOUNTER — Other Ambulatory Visit: Payer: Self-pay | Admitting: Family Medicine

## 2014-10-06 NOTE — Telephone Encounter (Signed)
Rx called in as directed.   

## 2014-10-14 ENCOUNTER — Encounter: Payer: PRIVATE HEALTH INSURANCE | Admitting: Family Medicine

## 2014-11-02 ENCOUNTER — Other Ambulatory Visit: Payer: Self-pay | Admitting: Family Medicine

## 2014-11-02 NOTE — Telephone Encounter (Signed)
plz phone in xanax. 

## 2014-11-03 ENCOUNTER — Other Ambulatory Visit: Payer: Self-pay | Admitting: Family Medicine

## 2014-11-03 NOTE — Telephone Encounter (Signed)
Rx called in as directed.   

## 2014-11-11 ENCOUNTER — Encounter: Payer: PRIVATE HEALTH INSURANCE | Admitting: Family Medicine

## 2014-11-21 ENCOUNTER — Emergency Department (HOSPITAL_COMMUNITY): Payer: PRIVATE HEALTH INSURANCE

## 2014-11-21 ENCOUNTER — Emergency Department (HOSPITAL_COMMUNITY)
Admission: EM | Admit: 2014-11-21 | Discharge: 2014-11-21 | Disposition: A | Discharge status: Home / Self Care | Payer: PRIVATE HEALTH INSURANCE | Attending: Emergency Medicine | Admitting: Emergency Medicine

## 2014-11-21 ENCOUNTER — Encounter (HOSPITAL_COMMUNITY): Payer: Self-pay | Admitting: Nurse Practitioner

## 2014-11-21 DIAGNOSIS — E119 Type 2 diabetes mellitus without complications: Secondary | ICD-10-CM | POA: Insufficient documentation

## 2014-11-21 DIAGNOSIS — M1990 Unspecified osteoarthritis, unspecified site: Secondary | ICD-10-CM | POA: Insufficient documentation

## 2014-11-21 DIAGNOSIS — Z8719 Personal history of other diseases of the digestive system: Secondary | ICD-10-CM | POA: Insufficient documentation

## 2014-11-21 DIAGNOSIS — Z7982 Long term (current) use of aspirin: Secondary | ICD-10-CM | POA: Diagnosis not present

## 2014-11-21 DIAGNOSIS — I10 Essential (primary) hypertension: Secondary | ICD-10-CM

## 2014-11-21 DIAGNOSIS — E785 Hyperlipidemia, unspecified: Secondary | ICD-10-CM | POA: Diagnosis not present

## 2014-11-21 DIAGNOSIS — Q6100 Congenital renal cyst, unspecified: Secondary | ICD-10-CM | POA: Insufficient documentation

## 2014-11-21 DIAGNOSIS — F418 Other specified anxiety disorders: Secondary | ICD-10-CM | POA: Insufficient documentation

## 2014-11-21 DIAGNOSIS — Z79899 Other long term (current) drug therapy: Secondary | ICD-10-CM | POA: Diagnosis not present

## 2014-11-21 DIAGNOSIS — H409 Unspecified glaucoma: Secondary | ICD-10-CM | POA: Diagnosis not present

## 2014-11-21 LAB — CBC WITH DIFFERENTIAL/PLATELET
Basophils Absolute: 0 10*3/uL (ref 0.0–0.1)
Basophils Relative: 0 % (ref 0–1)
Eosinophils Absolute: 0.3 10*3/uL (ref 0.0–0.7)
Eosinophils Relative: 5 % (ref 0–5)
HCT: 38.2 % (ref 36.0–46.0)
Hemoglobin: 11.8 g/dL — ABNORMAL LOW (ref 12.0–15.0)
Lymphocytes Relative: 44 % (ref 12–46)
Lymphs Abs: 2.4 10*3/uL (ref 0.7–4.0)
MCH: 26.3 pg (ref 26.0–34.0)
MCHC: 30.9 g/dL (ref 30.0–36.0)
MCV: 85.3 fL (ref 78.0–100.0)
Monocytes Absolute: 0.5 10*3/uL (ref 0.1–1.0)
Monocytes Relative: 8 % (ref 3–12)
Neutro Abs: 2.4 10*3/uL (ref 1.7–7.7)
Neutrophils Relative %: 43 % (ref 43–77)
Platelets: 308 10*3/uL (ref 150–400)
RBC: 4.48 MIL/uL (ref 3.87–5.11)
RDW: 15.5 % (ref 11.5–15.5)
WBC: 5.6 10*3/uL (ref 4.0–10.5)

## 2014-11-21 LAB — BASIC METABOLIC PANEL
Anion gap: 5 (ref 5–15)
BUN: 8 mg/dL (ref 6–23)
CO2: 31 mmol/L (ref 19–32)
Calcium: 9.2 mg/dL (ref 8.4–10.5)
Chloride: 102 mmol/L (ref 96–112)
Creatinine, Ser: 0.58 mg/dL (ref 0.50–1.10)
GFR calc Af Amer: >90 mL/min (ref >90)
GFR calc non Af Amer: >90 mL/min (ref >90)
Glucose, Bld: 99 mg/dL (ref 70–99)
Potassium: 3.7 mmol/L (ref 3.5–5.1)
Sodium: 138 mmol/L (ref 135–145)

## 2014-11-21 LAB — I-STAT TROPONIN, ED: Troponin i, poc: 0 ng/mL (ref 0.00–0.08)

## 2014-11-21 NOTE — ED Notes (Signed)
Patient returned from X-ray 

## 2014-11-21 NOTE — ED Provider Notes (Signed)
CSN: [id number]     Arrival date & time 11/21/14  1539 History   First MD Initiated Contact with Patient 11/21/14 1631     Chief Complaint  Patient presents with  . Hypertension     (Consider location/radiation/quality/duration/timing/severity/associated sxs/prior Treatment) HPI   PCP: Ria Bush, MD Blood pressure 152/97, pulse 72, temperature 97.8 F (36.6 C), temperature source Oral, resp. rate 18, SpO2 99 %.  Melissa [name] is a 64 y.o.female with a significant PMH of hypertension, glaucoma, depression, arthritis, diabetes, chest pains, dyspnea, DDD, hx of ulcer disease presents to the ER with complaints of elevated blood pressure. She also reports not feeling well over all. This past month she has been neglecting to take her HCTZ because of increased urination at work, drinking 8 glasses of water a day at work and having increased stress at work. She also admits to eating more salt and burgers than normal this past month. This past week her BP has been elevated at home. The highest was 172-110 at home while laying down .She reports two isolated events of DOE which she attributes to weight gain. She has also had some intermittent LE swelling, which she denies having at this exact time. This morning around 2 am she woke up with eye pressure, checked her BP and it was elevated. Took an extra HCTZ and Lisinopril and did not have improvement of her BP therefore presented to the ED for further eval.   Negative Review of Symptoms: chest pain, weakness, diaphoresis, confusion, syncope, fevers, neck pain, visual changes, ataxia, aphagia.    Past Medical History  Diagnosis Date  . HLD (hyperlipidemia)   . Hypertension   . Glaucoma   . Depression with anxiety   . Arthritis     s/p R TKR  . Diabetes type 2, controlled 2011    borderline  . History of ulcer disease   . Chest pain on exertion   . Dyspnea   . Cyst of left kidney 2015    by lumbar MRI pending renal US  . DDD  (degenerative disc disease), lumbar 03/2014    severe L2-3 with L HNP with L2/3 nerve root impingement Melissa [name] @ [facility])   Past Surgical History  Procedure Laterality Date  . Total knee arthroplasty Right 2004    TKR (Dr. Eddie Dibbles with [facility])  . Abdominal hysterectomy  2007    fibroids, heavy bleeding  . Total knee revision Right 03/2014    Dr Al Corpus [facility]   Family History  Problem Relation Age of Onset  . Cancer Maternal Grandmother 43    ovarian  . Diabetes Maternal Grandmother   . Hypertension Maternal Grandmother   . Cancer Mother 70    bone, MM  . Stroke Other     unsure who  . CAD Neg Hx    History  Substance Use Topics  . Smoking status: Never Smoker   . Smokeless tobacco: Never Used  . Alcohol Use: No   OB History    Gravida Para Term Preterm AB TAB SAB Ectopic Multiple Living   0         0     Review of Systems 10 Systems reviewed and are negative for acute change except as noted in the HPI.    Allergies  Codeine; Metformin and related; and Oxycodone  Home Medications   Prior to Admission medications   Medication Sig Start Date End Date Taking? Authorizing Provider  HYDROmorphone (DILAUDID) 2 MG tablet Take by mouth.  07/07/14  Yes Historical Provider, MD  senna-docusate (SENOKOT-S) 8.6-50 MG per tablet Take 1 tablet by mouth daily. 03/21/14  Yes Historical Provider, MD  ALPRAZolam Duanne Moron) 0.25 MG tablet TAKE 1 TABLET BY MOUTH AT BEDTIME AS NEEDED 11/02/14   Ria Bush, MD  amLODipine (NORVASC) 5 MG tablet Take 1 tablet (5 mg total) by mouth at bedtime. 09/08/14   Ria Bush, MD  aspirin EC 81 MG tablet Take 81 mg by mouth daily.    Historical Provider, MD  atorvastatin (LIPITOR) 40 MG tablet Take 1 tablet (40 mg total) by mouth daily. 08/05/14   Ria Bush, MD  benzonatate (TESSALON) 100 MG capsule TAKE ONE CAPSULE BY MOUTH TWICE DAILY AS NEEDED FOR COUGH 11/02/14   Ria Bush, MD  dorzolamide-timolol (COSOPT) 22.3-6.8 MG/ML ophthalmic solution  Place 1 drop into the left eye 2 (two) times daily. 11/13/14   Historical Provider, MD  glipiZIDE (GLUCOTROL XL) 5 MG 24 hr tablet Take 1-2 tablets (5-10 mg total) by mouth daily with breakfast. 08/05/14   Ria Bush, MD  hydrochlorothiazide (HYDRODIURIL) 12.5 MG tablet Take 12.5 mg by mouth daily. 10/15/14   Historical Provider, MD  ibuprofen (ADVIL,MOTRIN) 200 MG tablet Take 400 mg by mouth as needed. Pain    Historical Provider, MD  lisinopril (PRINIVIL,ZESTRIL) 20 MG tablet Take 1 tablet (20 mg total) by mouth at bedtime as needed (if blood pressure remains elevated despite amlodipine 5mg ). 09/08/14   Ria Bush, MD  lisinopril-hydrochlorothiazide (ZESTORETIC) 20-12.5 MG per tablet Take 1 tablet by mouth daily. In AM 09/08/14   Ria Bush, MD  metFORMIN (GLUCOPHAGE) 500 MG tablet Take 500 m by mouth daily.    Historical Provider, MD  Probiotic Product (ALIGN) 4 MG CAPS Take 1 capsule (4 mg total) by mouth daily. 12/02/13   Audelia Hives Presson, PA  rosuvastatin (CRESTOR) 40 MG tablet Take 20 mg by mouth daily.    Historical Provider, MD   BP 154/93 mmHg  Pulse 70  Temp(Src) 97.8 F (36.6 C) (Oral)  Resp 14  SpO2 99% Physical Exam  Constitutional: She appears well-developed and well-nourished. No distress.  HENT:  Head: Normocephalic and atraumatic.  Eyes: Pupils are equal, round, and reactive to light.  Neck: Normal range of motion. Neck supple.  Cardiovascular: Normal rate and regular rhythm.   Pulmonary/Chest: Effort normal. She has no decreased breath sounds. She has no wheezes. She has no rhonchi. She has no rales.  Abdominal: Soft.  Musculoskeletal:  No LE swelling  Neurological: She is alert.  Skin: Skin is warm and dry.  Nursing note and vitals reviewed.   ED Course  Procedures (including critical care time) Labs Review Labs Reviewed  CBC WITH DIFFERENTIAL/PLATELET - Abnormal; Notable for the following:    Hemoglobin 11.8 (*)    All other components within  normal limits  BASIC METABOLIC PANEL  Randolm Idol, ED    Imaging Review Dg Chest 2 View  11/21/2014   CLINICAL DATA:  Hypertension and headache.  EXAM: CHEST  2 VIEW  COMPARISON:  07/26/2012  FINDINGS: The cardiomediastinal silhouette is unremarkable.  There is no evidence of focal airspace disease, pulmonary edema, suspicious pulmonary nodule/mass, pleural effusion, or pneumothorax. No acute bony abnormalities are identified. All  IMPRESSION: No active cardiopulmonary disease.   Electronically Signed   By: Margarette Canada M.D.   On: 11/21/2014 18:41     EKG Interpretation None      MDM   Final diagnoses:  Hypertension    Patient  has had a normal chest, neg trop, and unremarkable BMP and CBC. She is not having any CP, DOE, or lower extremity swelling. I discussed the DASH diet and taking her medications everyday as prescribed on a regular schedule. Her BP has been elevated at home but I can not alter her medications until she is  Compliant on them first. We also talked about her walking and weight loss. She is agreeable to this. She promises to f/u with her PCP. I have discussed the patient with my supervising attending who is aware of my work-up and plan.  64 y.o.Melissa [name]'s evaluation in the Emergency Department is complete. It has been determined that no acute conditions requiring further emergency intervention are present at this time. The patient/guardian have been advised of the diagnosis and plan. We have discussed signs and symptoms that warrant return to the ED, such as changes or worsening in symptoms.  Vital signs are stable at discharge. Filed Vitals:   11/21/14 1930  BP: 154/93  Pulse: 70  Temp:   Resp: 14    Patient/guardian has voiced understanding and agreed to follow-up with the PCP or specialist.    Delos Haring, PA-C 11/21/14 1950  Tanna Furry, MD 11/21/14 2228

## 2014-11-21 NOTE — ED Notes (Signed)
This week her BP has been elevated despite taking her BP meds as prescribed. She checks her BP on home monitor. She states she just doesn't feel well this week, and shes had some headaches. She has noticed Her BP gets higher when she lies flat.

## 2014-11-21 NOTE — Discharge Instructions (Signed)
Hypertension Hypertension, commonly called high blood pressure, is when the force of blood pumping through your arteries is too strong. Your arteries are the blood vessels that carry blood from your heart throughout your body. A blood pressure reading consists of a higher number over a lower number, such as 110/72. The higher number (systolic) is the pressure inside your arteries when your heart pumps. The lower number (diastolic) is the pressure inside your arteries when your heart relaxes. Ideally you want your blood pressure below 120/80. Hypertension forces your heart to work harder to pump blood. Your arteries may become narrow or stiff. Having hypertension puts you at risk for heart disease, stroke, and other problems.  RISK FACTORS Some risk factors for high blood pressure are controllable. Others are not.  Risk factors you cannot control include:   Race. You may be at higher risk if you are African American.  Age. Risk increases with age.  Gender. Men are at higher risk than women before age 45 years. After age 65, women are at higher risk than men. Risk factors you can control include:  Not getting enough exercise or physical activity.  Being overweight.  Getting too much fat, sugar, calories, or salt in your diet.  Drinking too much alcohol. SIGNS AND SYMPTOMS Hypertension does not usually cause signs or symptoms. Extremely high blood pressure (hypertensive crisis) may cause headache, anxiety, shortness of breath, and nosebleed. DIAGNOSIS  To check if you have hypertension, your health care provider will measure your blood pressure while you are seated, with your arm held at the level of your heart. It should be measured at least twice using the same arm. Certain conditions can cause a difference in blood pressure between your right and left arms. A blood pressure reading that is higher than normal on one occasion does not mean that you need treatment. If one blood pressure reading  is high, ask your health care provider about having it checked again. TREATMENT  Treating high blood pressure includes making lifestyle changes and possibly taking medicine. Living a healthy lifestyle can help lower high blood pressure. You may need to change some of your habits. Lifestyle changes may include:  Following the DASH diet. This diet is high in fruits, vegetables, and whole grains. It is low in salt, red meat, and added sugars.  Getting at least 2 hours of brisk physical activity every week.  Losing weight if necessary.  Not smoking.  Limiting alcoholic beverages.  Learning ways to reduce stress. If lifestyle changes are not enough to get your blood pressure under control, your health care provider may prescribe medicine. You may need to take more than one. Work closely with your health care provider to understand the risks and benefits. HOME CARE INSTRUCTIONS  Have your blood pressure rechecked as directed by your health care provider.   Take medicines only as directed by your health care provider. Follow the directions carefully. Blood pressure medicines must be taken as prescribed. The medicine does not work as well when you skip doses. Skipping doses also puts you at risk for problems.   Do not smoke.   Monitor your blood pressure at home as directed by your health care provider. SEEK MEDICAL CARE IF:   You think you are having a reaction to medicines taken.  You have recurrent headaches or feel dizzy.  You have swelling in your ankles.  You have trouble with your vision. SEEK IMMEDIATE MEDICAL CARE IF:  You develop a severe headache or confusion.    You have unusual weakness, numbness, or feel faint.  You have severe chest or abdominal pain.  You vomit repeatedly.  You have trouble breathing. MAKE SURE YOU:   Understand these instructions.  Will watch your condition.  Will get help right away if you are not doing well or get worse. Document  Released: 08/21/2005 Document Revised: 01/05/2014 Document Reviewed: 06/13/2013 ExitCare Patient Information 2015 [employer]. This information is not intended to replace advice given to you by your health care provider. Make sure you discuss any questions you have with your health care provider. DASH Eating Plan DASH stands for "Dietary Approaches to Stop Hypertension." The DASH eating plan is a healthy eating plan that has been shown to reduce high blood pressure (hypertension). Additional health benefits may include reducing the risk of type 2 diabetes mellitus, heart disease, and stroke. The DASH eating plan may also help with weight loss. WHAT DO I NEED TO KNOW ABOUT THE DASH EATING PLAN? For the DASH eating plan, you will follow these general guidelines:  Choose foods with a percent daily value for sodium of less than 5% (as listed on the food label).  Use salt-free seasonings or herbs instead of table salt or sea salt.  Check with your health care provider or pharmacist before using salt substitutes.  Eat lower-sodium products, often labeled as "lower sodium" or "no salt added."  Eat fresh foods.  Eat more vegetables, fruits, and low-fat dairy products.  Choose whole grains. Look for the word "whole" as the first word in the ingredient list.  Choose fish and skinless chicken or turkey more often than red meat. Limit fish, poultry, and meat to 6 oz (170 g) each day.  Limit sweets, desserts, sugars, and sugary drinks.  Choose heart-healthy fats.  Limit cheese to 1 oz (28 g) per day.  Eat more home-cooked food and less restaurant, buffet, and fast food.  Limit fried foods.  Cook foods using methods other than frying.  Limit canned vegetables. If you do use them, rinse them well to decrease the sodium.  When eating at a restaurant, ask that your food be prepared with less salt, or no salt if possible. WHAT FOODS CAN I EAT? Seek help from a dietitian for individual  calorie needs. Grains Whole grain or whole wheat bread. Brown rice. Whole grain or whole wheat pasta. Quinoa, bulgur, and whole grain cereals. Low-sodium cereals. Corn or whole wheat flour tortillas. Whole grain cornbread. Whole grain crackers. Low-sodium crackers. Vegetables Fresh or frozen vegetables (raw, steamed, roasted, or grilled). Low-sodium or reduced-sodium tomato and vegetable juices. Low-sodium or reduced-sodium tomato sauce and paste. Low-sodium or reduced-sodium canned vegetables.  Fruits All fresh, canned (in natural juice), or frozen fruits. Meat and Other Protein Products Ground beef (85% or leaner), grass-fed beef, or beef trimmed of fat. Skinless chicken or turkey. Ground chicken or turkey. Pork trimmed of fat. All fish and seafood. Eggs. Dried beans, peas, or lentils. Unsalted nuts and seeds. Unsalted canned beans. Dairy Low-fat dairy products, such as skim or 1% milk, 2% or reduced-fat cheeses, low-fat ricotta or cottage cheese, or plain low-fat yogurt. Low-sodium or reduced-sodium cheeses. Fats and Oils Tub margarines without trans fats. Light or reduced-fat mayonnaise and salad dressings (reduced sodium). Avocado. Safflower, olive, or canola oils. Natural peanut or almond butter. Other Unsalted popcorn and pretzels. The items listed above may not be a complete list of recommended foods or beverages. Contact your dietitian for more options. WHAT FOODS ARE NOT RECOMMENDED? Grains White bread.   White pasta. White rice. Refined cornbread. Bagels and croissants. Crackers that contain trans fat. Vegetables Creamed or fried vegetables. Vegetables in a cheese sauce. Regular canned vegetables. Regular canned tomato sauce and paste. Regular tomato and vegetable juices. Fruits Dried fruits. Canned fruit in light or heavy syrup. Fruit juice. Meat and Other Protein Products Fatty cuts of meat. Ribs, chicken wings, bacon, sausage, bologna, salami, chitterlings, fatback, hot dogs,  bratwurst, and packaged luncheon meats. Salted nuts and seeds. Canned beans with salt. Dairy Whole or 2% milk, cream, half-and-half, and cream cheese. Whole-fat or sweetened yogurt. Full-fat cheeses or blue cheese. Nondairy creamers and whipped toppings. Processed cheese, cheese spreads, or cheese curds. Condiments Onion and garlic salt, seasoned salt, table salt, and sea salt. Canned and packaged gravies. Worcestershire sauce. Tartar sauce. Barbecue sauce. Teriyaki sauce. Soy sauce, including reduced sodium. Steak sauce. Fish sauce. Oyster sauce. Cocktail sauce. Horseradish. Ketchup and mustard. Meat flavorings and tenderizers. Bouillon cubes. Hot sauce. Tabasco sauce. Marinades. Taco seasonings. Relishes. Fats and Oils Butter, stick margarine, lard, shortening, ghee, and bacon fat. Coconut, palm kernel, or palm oils. Regular salad dressings. Other Pickles and olives. Salted popcorn and pretzels. The items listed above may not be a complete list of foods and beverages to avoid. Contact your dietitian for more information. WHERE CAN I FIND MORE INFORMATION? National Heart, Lung, and Blood Institute: www.nhlbi.nih.gov/health/health-topics/topics/dash/ Document Released: 08/10/2011 Document Revised: 01/05/2014 Document Reviewed: 06/25/2013 ExitCare Patient Information 2015 ExitCare, [employer]. This information is not intended to replace advice given to you by your health care provider. Make sure you discuss any questions you have with your health care provider.  

## 2014-11-21 NOTE — ED Notes (Signed)
Patient transported to X-ray 

## 2014-12-01 ENCOUNTER — Other Ambulatory Visit: Payer: Self-pay | Admitting: Family Medicine

## 2014-12-01 NOTE — Telephone Encounter (Signed)
Rx called in as directed.   

## 2014-12-01 NOTE — Telephone Encounter (Signed)
plz phone in. 

## 2014-12-01 NOTE — Telephone Encounter (Signed)
Ok to refill 

## 2014-12-15 DIAGNOSIS — M4726 Other spondylosis with radiculopathy, lumbar region: Secondary | ICD-10-CM | POA: Insufficient documentation

## 2014-12-15 HISTORY — DX: Other spondylosis with radiculopathy, lumbar region: M47.26

## 2015-01-01 ENCOUNTER — Other Ambulatory Visit: Payer: Self-pay | Admitting: Family Medicine

## 2015-01-01 NOTE — Telephone Encounter (Signed)
Ok to refill 

## 2015-01-01 NOTE — Telephone Encounter (Signed)
plz phone in. 

## 2015-01-01 NOTE — Telephone Encounter (Signed)
Rx called in as directed.   

## 2015-02-03 ENCOUNTER — Other Ambulatory Visit: Payer: Self-pay | Admitting: Family Medicine

## 2015-02-03 NOTE — Telephone Encounter (Signed)
Rx called in as directed.   

## 2015-02-03 NOTE — Telephone Encounter (Signed)
plz phone in. 

## 2015-02-04 ENCOUNTER — Telehealth: Payer: Self-pay | Admitting: Family Medicine

## 2015-02-04 ENCOUNTER — Ambulatory Visit: Payer: PRIVATE HEALTH INSURANCE | Admitting: Family Medicine

## 2015-02-04 NOTE — Telephone Encounter (Signed)
Patient did not come in for their appointment today foe med refill.  Please let me know if patient needs to be contacted immediately for follow up or no follow up needed.

## 2015-02-04 NOTE — Telephone Encounter (Signed)
Patient called and she thought her appointment was next week.  Patient rescheduled appointment to 02/08/15.

## 2015-02-08 ENCOUNTER — Ambulatory Visit: Payer: PRIVATE HEALTH INSURANCE | Admitting: Family Medicine

## 2015-02-08 LAB — HM DIABETES EYE EXAM

## 2015-02-09 ENCOUNTER — Encounter: Payer: Self-pay | Admitting: Family Medicine

## 2015-02-09 ENCOUNTER — Other Ambulatory Visit: Payer: Self-pay | Admitting: *Deleted

## 2015-02-09 ENCOUNTER — Ambulatory Visit (INDEPENDENT_AMBULATORY_CARE_PROVIDER_SITE_OTHER): Payer: PRIVATE HEALTH INSURANCE | Admitting: Family Medicine

## 2015-02-09 VITALS — BP 124/86 | HR 76 | Temp 98.1°F | Wt 234.5 lb

## 2015-02-09 DIAGNOSIS — E119 Type 2 diabetes mellitus without complications: Secondary | ICD-10-CM | POA: Diagnosis not present

## 2015-02-09 DIAGNOSIS — E7800 Pure hypercholesterolemia, unspecified: Secondary | ICD-10-CM

## 2015-02-09 DIAGNOSIS — I10 Essential (primary) hypertension: Secondary | ICD-10-CM | POA: Diagnosis not present

## 2015-02-09 DIAGNOSIS — E669 Obesity, unspecified: Secondary | ICD-10-CM | POA: Diagnosis not present

## 2015-02-09 LAB — BASIC METABOLIC PANEL
BUN: 18 mg/dL (ref 6–23)
CO2: 27 mEq/L (ref 19–32)
Calcium: 9.7 mg/dL (ref 8.4–10.5)
Chloride: 99 mEq/L (ref 96–112)
Creatinine, Ser: 0.75 mg/dL (ref 0.40–1.20)
GFR: 99.93 mL/min (ref >60.00)
Glucose, Bld: 109 mg/dL — ABNORMAL HIGH (ref 70–99)
Potassium: 3.7 mEq/L (ref 3.5–5.1)
Sodium: 135 mEq/L (ref 135–145)

## 2015-02-09 LAB — TSH: TSH: 1.49 u[IU]/mL (ref 0.35–4.50)

## 2015-02-09 LAB — HEMOGLOBIN A1C: Hgb A1c MFr Bld: 6.8 % — ABNORMAL HIGH (ref 4.6–6.5)

## 2015-02-09 LAB — LDL CHOLESTEROL, DIRECT: Direct LDL: 192 mg/dL

## 2015-02-09 MED ORDER — LISINOPRIL-HYDROCHLOROTHIAZIDE 20-12.5 MG PO TABS: 1.0000 | ORAL_TABLET | Freq: Every day | ORAL | Status: DC

## 2015-02-09 MED ORDER — AMLODIPINE BESYLATE 5 MG PO TABS: 5.0000 mg | ORAL_TABLET | Freq: Every day | ORAL | Status: DC

## 2015-02-09 MED ORDER — HYDROCHLOROTHIAZIDE 12.5 MG PO TABS: 12.5000 mg | ORAL_TABLET | Freq: Every day | ORAL | Status: DC

## 2015-02-09 NOTE — Assessment & Plan Note (Signed)
Improved but pt remains confused about med regimen - has not been taking amlodipine. Written out BP regimen on instruction sheet today. Continue amlodipine, lisinopril /hctz and plain hctz. Pt agrees with plan.

## 2015-02-09 NOTE — Patient Instructions (Addendum)
Your blood pressure regimen: 1. Hydrochlorothiazide 12.5mg  in the morning 2. Amlodipine 5mg  in the morning 3. Lisinopril/hydrochlorothiazide 20/12.5mg  in the evening. Let us know if blood pressure running too low or too high on this regimen. meds refilled today. labwork today. Good to see you today. Return in 6 months for physical

## 2015-02-09 NOTE — Progress Notes (Signed)
BP 124/86 mmHg  Pulse 76  Temp(Src) 98.1 F (36.7 C) (Oral)  Wt 234 lb 8 oz (106.369 kg)   CC: med refill visit  Subjective:    Patient ID: Melissa [name], female    DOB: 05/01/1951, 64 y.o.   MRN: [id number]  HPI: Melissa [name] is a 64 y.o. female presenting on 02/09/2015 for Medication Management   She is changing insurance and will not be able to return for at least 6 months.   HTN - remains confused about meds. Compliant with current antihypertensive regimen of lisinopril hctz 20/12.5mg  nightly and hydrochlorothiazide 12.5mg  QAM. Has not been taking amlodipine 5mg  daily - "didn't know". Does check blood pressures at home: occasional highs otherwise ok.  No low blood pressure readings or symptoms of dizziness/syncope.  Denies HA, vision changes, CP/tightness, SOB, leg swelling.    Anxiety - takes alprazolam 0.25mg  nightly. Discussed habit forming medication.  HLD - tolerating atorvastatin well without myalgias.  Lab Results  Component Value Date   HGBA1C 6.4 04/21/2014  DM - off metformin for last year. Glipizide XL 5mg  daily working well. Does check sugars 1-2x/day - fasting 110s. No paresthesias. Occasional low sugars when she goes period without eating. Does take glipizide with breakfast every morning. Intolerant to metformin. Eye exam - yesterday. + glaucoma and cataracts. Pneumovax 08/2014. Diabetic Foot Exam - Simple   Simple Foot Form  Diabetic Foot exam was performed with the following findings:  Yes 02/09/2015 10:02 AM  Visual Inspection  See comments:  Yes  Sensation Testing  Intact to touch and monofilament testing bilaterally:  Yes  Pulse Check  Posterior Tibialis and Dorsalis pulse intact bilaterally:  Yes  Comments  Some callus formation      Obesity - started walking  Relevant past medical, surgical, family and social history reviewed and updated as indicated. Interim medical history since our last visit reviewed. Allergies and medications reviewed  and updated. Current Outpatient Prescriptions on File Prior to Visit  Medication Sig  . ALPRAZolam (XANAX) 0.25 MG tablet TAKE 1 TABLET BY MOUTH EVERY NIGHT AT BEDTIME AS NEEDED  . aspirin EC 81 MG tablet Take 81 mg by mouth daily.  [name] atorvastatin (LIPITOR) 40 MG tablet Take 1 tablet (40 mg total) by mouth daily.  . dorzolamide-timolol (COSOPT) 22.3-6.8 MG/ML ophthalmic solution Place 1 drop into the left eye 2 (two) times daily.  [name] ibuprofen (ADVIL,MOTRIN) 200 MG tablet Take 400 mg by mouth as needed. Pain  . Probiotic Product (ALIGN) 4 MG CAPS Take 1 capsule (4 mg total) by mouth daily.   No current facility-administered medications on file prior to visit.    Review of Systems Per HPI unless specifically indicated above     Objective:    BP 124/86 mmHg  Pulse 76  Temp(Src) 98.1 F (36.7 C) (Oral)  Wt 234 lb 8 oz (106.369 kg)  Wt Readings from Last 3 Encounters:  02/09/15 234 lb 8 oz (106.369 kg)  09/08/14 214 lb 8 oz (97.297 kg)  08/05/14 209 lb 8 oz (95.029 kg)    Physical Exam  Constitutional: She appears well-developed and well-nourished. No distress.  HENT:  Head: Normocephalic and atraumatic.  Right Ear: External ear normal.  Left Ear: External ear normal.  Nose: Nose normal.  Mouth/Throat: Oropharynx is clear and moist. No oropharyngeal exudate.  Eyes: Conjunctivae and EOM are normal. Pupils are equal, round, and reactive to light. No scleral icterus.  Neck: Normal range of motion. Neck supple.  Cardiovascular:  Normal rate, regular rhythm, normal heart sounds and intact distal pulses.   No murmur heard. Pulmonary/Chest: Effort normal and breath sounds normal. No respiratory distress. She has no wheezes. She has no rales.  Musculoskeletal: She exhibits no edema.  See HPI for foot exam if done  Lymphadenopathy:    She has no cervical adenopathy.  Skin: Skin is warm and dry. No rash noted.  Psychiatric: She has a normal mood and affect.  Nursing note and vitals  reviewed.      Assessment & Plan:   Problem List Items Addressed This Visit    HTN (hypertension) - Primary (Chronic)    Improved but pt remains confused about med regimen - has not been taking amlodipine. Written out BP regimen on instruction sheet today. Continue amlodipine, lisinopril /hctz and plain hctz. Pt agrees with plan.      Relevant Medications   hydrochlorothiazide (HYDRODIURIL) 12.5 MG tablet   lisinopril-hydrochlorothiazide (ZESTORETIC) 20-12.5 MG per tablet   amLODipine (NORVASC) 5 MG tablet   Other Relevant Orders   TSH   Hypercholesteremia    Stable on atorvastatin - continue. Check d LDL today.      Relevant Medications   hydrochlorothiazide (HYDRODIURIL) 12.5 MG tablet   lisinopril-hydrochlorothiazide (ZESTORETIC) 20-12.5 MG per tablet   amLODipine (NORVASC) 5 MG tablet   Other Relevant Orders   LDL Cholesterol, Direct   Obesity    Discussed healthy lifestyle changes to affect sustainable weight loss.      Relevant Medications   glipiZIDE (GLUCOTROL XL) 5 MG 24 hr tablet   Type 2 diabetes mellitus, controlled    Intolerant to metformin. Foot exam today. UTD eye exam (yesterday) found cataracts. Discussed plan for hypoglycemia  Discussed need to take glipizide with meal. RTC 6 mo f/u visit. Check A1c today.      Relevant Medications   lisinopril-hydrochlorothiazide (ZESTORETIC) 20-12.5 MG per tablet   glipiZIDE (GLUCOTROL XL) 5 MG 24 hr tablet   Other Relevant Orders   Hemoglobin K5T   Basic metabolic panel       Follow up plan: Return in about 6 months (around 08/11/2015), or as needed, for annual exam, prior fasting for blood work.

## 2015-02-09 NOTE — Progress Notes (Signed)
Pre visit review using our clinic review tool, if applicable. No additional management support is needed unless otherwise documented below in the visit note. 

## 2015-02-09 NOTE — Assessment & Plan Note (Signed)
Stable on atorvastatin - continue. Check d LDL today.

## 2015-02-09 NOTE — Assessment & Plan Note (Signed)
Intolerant to metformin. Foot exam today. UTD eye exam (yesterday) found cataracts. Discussed plan for hypoglycemia  Discussed need to take glipizide with meal. RTC 6 mo f/u visit. Check A1c today.

## 2015-02-09 NOTE — Assessment & Plan Note (Signed)
Discussed healthy lifestyle changes to affect sustainable weight loss.  

## 2015-02-11 ENCOUNTER — Encounter: Payer: Self-pay | Admitting: *Deleted

## 2015-02-25 ENCOUNTER — Telehealth: Payer: Self-pay

## 2015-02-25 NOTE — Telephone Encounter (Signed)
Pt request all meds sent to [facility]; advised pt lisinopril HCTZ,HCTZ and [facility]lodipine [facility] and [facility]itor [facility]; [facility] will [facility].

## 2015-02-26 ENCOUNTER — Telehealth: Payer: Self-pay | Admitting: *Deleted

## 2015-02-26 NOTE — Telephone Encounter (Signed)
Called patient back and advised that is not the way Dr. Darnell Level wanted her to take the medications. I instructed her again with his directions multiple times and she repeated them back to me.

## 2015-02-26 NOTE — Telephone Encounter (Signed)
Pharmacist called from [facility] because pt is there picking up Rxs and she told them she was only suppose to be on 1 BP med and they were calling to see if they needed to fill the HCTZ or the Lisinopril/HCTZ, they advise me that pt was very confused about which BP med she is suppose to be taking. They said that pt also never picked up Rx for amlodipine so they are not sure if she has been taking that BP med either. Pt brought them her discharge summary from the ER visit on 11/21/14, I advise pharmacy that pt saw Dr. Darnell Level on 02/09/15 and that he went over her medication regimen in details and how she is suppose to take BP meds, and he also printed the instructions out on her check out papers from Dr. Synthia Innocent visit:  "Your blood pressure regimen: 1. Hydrochlorothiazide 12.5mg  in the morning 2. Amlodipine 5mg  in the morning 3. Lisinopril/hydrochlorothiazide 20/12.5mg  in the evening. Let us know if blood pressure running too low or too high on this regimen. meds refilled today."  I read the exact pt instructions to the pharmacist from her [place] on 02/09/15. The pharmacist advise me that pt is still confused about which med she needs to be taking and that I should call her to discuss this with her. Called pt and no answer so I left a voicemail requesting pt to call office back.

## 2015-02-26 NOTE — Telephone Encounter (Signed)
Patient called and said she picked up medication at the pharmacy and she's not confused.  She said she picked up 3 blood pressure medications and she's suppose to take them morning, noon, and night.

## 2015-03-02 ENCOUNTER — Other Ambulatory Visit: Payer: Self-pay

## 2015-03-02 NOTE — Telephone Encounter (Signed)
Pt left v/m requesting refill alprazolam to [facility]; last refilled # 30 on 02/03/2015. Last seen 02/09/2015.

## 2015-03-04 MED ORDER — ALPRAZOLAM 0.25 MG PO TABS: 0.2500 mg | ORAL_TABLET | Freq: Every evening | ORAL | Status: DC | PRN

## 2015-03-04 NOTE — Telephone Encounter (Signed)
plz phone in. 

## 2015-03-04 NOTE — Telephone Encounter (Signed)
Rx called in as directed.   

## 2015-03-17 ENCOUNTER — Telehealth: Payer: Self-pay

## 2015-03-17 MED ORDER — CITALOPRAM HYDROBROMIDE 10 MG PO TABS: 10.0000 mg | ORAL_TABLET | Freq: Every day | ORAL | Status: DC

## 2015-03-17 NOTE — Telephone Encounter (Signed)
Noted.  Sent in. plz notify pt. Schedule 1 mo f/u visit. Ok to schedule in 15 min slot.

## 2015-03-17 NOTE — Telephone Encounter (Signed)
Pt left v/m; pt is feeling depressed; pt has depression on and off for 1 month; pt feels down and lack of energy. Pt has had depression on and off for years. No SI/HI. Pt request citalopram to [facility] Dr Darnell Level does not have 30 min appt this week and then Dr Darnell Level will be out of office for 2 weeks. Please advise.

## 2015-03-17 NOTE — Telephone Encounter (Signed)
Called patient notifying them that Rx was sent into pharmacy. Patient stated that she would call back to schedule her 1 month follow up visit.

## 2015-03-25 ENCOUNTER — Other Ambulatory Visit: Payer: Self-pay | Admitting: Family Medicine

## 2015-03-26 ENCOUNTER — Other Ambulatory Visit: Payer: Self-pay

## 2015-03-26 DIAGNOSIS — Z1231 Encounter for screening mammogram for malignant neoplasm of breast: Secondary | ICD-10-CM

## 2015-03-26 NOTE — Telephone Encounter (Deleted)
Electronic refill request. Last Filled:    30 tablet 0 RF on 03/04/2015  Please advise.

## 2015-03-26 NOTE — Telephone Encounter (Signed)
Last refilled 03/04/15 for #30. Last office visit was 02/09/15 - Acute. Okay to refill?

## 2015-03-26 NOTE — Telephone Encounter (Signed)
This looks to be early by the sig.  Would need to wait until near the end of the month.

## 2015-03-31 ENCOUNTER — Other Ambulatory Visit: Payer: Self-pay | Admitting: Family Medicine

## 2015-04-01 NOTE — Telephone Encounter (Signed)
Medication phoned to pharmacy.  

## 2015-04-01 NOTE — Telephone Encounter (Signed)
Please call in.  Thanks.   

## 2015-04-01 NOTE — Telephone Encounter (Signed)
Ok to refill in Dr. Synthia Innocent absence? Last filled 03/04/15 #30 0RF

## 2015-04-29 ENCOUNTER — Ambulatory Visit: Payer: PRIVATE HEALTH INSURANCE

## 2015-04-29 ENCOUNTER — Other Ambulatory Visit: Payer: Self-pay | Admitting: Family Medicine

## 2015-04-29 NOTE — Telephone Encounter (Signed)
Electronically refill request  Alprazolam 0.25 MG tablet  Dispense: 30 tablet   Refills: 0  Last prescribed on 04/01/15. Last seen on 02/09/15. No future apt.

## 2015-04-29 NOTE — Telephone Encounter (Signed)
Pt left v/m requesting status of alprazolam refill; pt said due on Sun 05/02/15 but would like sent to pharmacy by Fri 04/30/15. Pt request cb when called in.

## 2015-04-29 NOTE — Telephone Encounter (Signed)
Rx called in as directed.   

## 2015-04-29 NOTE — Telephone Encounter (Signed)
plz phone in. 

## 2015-05-18 ENCOUNTER — Ambulatory Visit: Payer: PRIVATE HEALTH INSURANCE

## 2015-05-30 ENCOUNTER — Other Ambulatory Visit: Payer: Self-pay | Admitting: Family Medicine

## 2015-05-31 NOTE — Telephone Encounter (Signed)
Rx called in as directed.   

## 2015-05-31 NOTE — Telephone Encounter (Signed)
plz phone in. 

## 2015-06-14 ENCOUNTER — Ambulatory Visit: Payer: PRIVATE HEALTH INSURANCE

## 2015-06-28 ENCOUNTER — Other Ambulatory Visit: Payer: Self-pay | Admitting: Family Medicine

## 2015-06-29 ENCOUNTER — Other Ambulatory Visit: Payer: Self-pay | Admitting: Family Medicine

## 2015-06-29 NOTE — Telephone Encounter (Signed)
Rx called in as directed.   

## 2015-06-29 NOTE — Telephone Encounter (Signed)
plz phone in. 

## 2015-06-29 NOTE — Telephone Encounter (Signed)
Ok to refill 

## 2015-07-27 ENCOUNTER — Other Ambulatory Visit: Payer: Self-pay | Admitting: Family Medicine

## 2015-07-27 NOTE — Telephone Encounter (Signed)
plz phone in. 

## 2015-07-27 NOTE — Telephone Encounter (Signed)
Ok to refill 

## 2015-07-28 NOTE — Telephone Encounter (Signed)
Rx called in as directed.   

## 2015-08-28 ENCOUNTER — Other Ambulatory Visit: Payer: Self-pay | Admitting: Family Medicine

## 2015-08-31 ENCOUNTER — Other Ambulatory Visit: Payer: Self-pay | Admitting: Family Medicine

## 2015-08-31 NOTE — Telephone Encounter (Signed)
Rx called in as directed.   

## 2015-08-31 NOTE — Telephone Encounter (Signed)
plz phone in. 

## 2015-09-02 ENCOUNTER — Other Ambulatory Visit: Payer: Self-pay | Admitting: Family Medicine

## 2015-09-29 ENCOUNTER — Other Ambulatory Visit: Payer: Self-pay | Admitting: Family Medicine

## 2015-09-30 NOTE — Telephone Encounter (Signed)
Rx called in as directed.   

## 2015-09-30 NOTE — Telephone Encounter (Signed)
plz phone in. 

## 2015-09-30 NOTE — Telephone Encounter (Signed)
Ok to refill 

## 2015-10-11 ENCOUNTER — Ambulatory Visit: Payer: PRIVATE HEALTH INSURANCE

## 2015-10-19 ENCOUNTER — Inpatient Hospital Stay: Admission: RE | Admit: 2015-10-19 | Payer: PRIVATE HEALTH INSURANCE | Source: Ambulatory Visit

## 2015-11-08 ENCOUNTER — Ambulatory Visit: Payer: PRIVATE HEALTH INSURANCE

## 2015-12-03 ENCOUNTER — Encounter: Payer: Self-pay | Admitting: Gastroenterology

## 2015-12-28 ENCOUNTER — Telehealth: Payer: Self-pay

## 2015-12-28 DIAGNOSIS — N281 Cyst of kidney, acquired: Secondary | ICD-10-CM

## 2015-12-28 NOTE — Telephone Encounter (Signed)
Spoke with patient. I advised that she hasn't been in the office since last year, so wasn't I sure what the insurance company was talking about labs from 1/17. I also advised that we didn't have anything in her chart about kidney problems but told her that I would look into it. I spoke with Shirlean Mylar from [facility] and asked which doctor was on the labs from January. She couldn't advise me because the agent had the labs and he was in his office with a client. She just said he needed a letter stating that the patient was clear. I advised that Dr. Synthia Innocent name shouldn't be on the labs and that he couldn't write that letter if another doctor found something. I told her to find out which doctor ordered the labs and to call me back and advise because we had no record of them at all in her chart. Will await return call.

## 2015-12-28 NOTE — Telephone Encounter (Signed)
Pt left vm; pt has applied for life ins; records that were sent from [facility] The noted that pt had labs 09/2015 for kidney problem and pt said it is affecting her ability to get life ins. Pt said she is not aware of any kidney problems and pt request cb with updated or corrected info in pts chart.

## 2015-12-28 NOTE — Telephone Encounter (Signed)
Spoke with Gwyndolyn Saxon [employer] agent. He said the underwriter was questioning an MRI that patient had in January of 2016 showing a left kidney cyst and saying that she was to follow up with a renal US. I see nothing in her chart about this or imaging results about this at all. He said there was no lab work involved-only imaging studies. She had OV 1/16, but imaging wasn't ordered or mentioned. No results in chart. He couldn't advise any further other than he needed a letter from you stating that she didn't have any imaging ordered or done 1/16 showing a kidney cyst that required renal US ordered by you or her her [place] chart faxed to his attn [phone].

## 2015-12-30 NOTE — Telephone Encounter (Signed)
Patient notified. She will await call for scheduling.

## 2015-12-30 NOTE — Telephone Encounter (Signed)
[place] scheduled at [place] and patient aware.

## 2015-12-30 NOTE — Telephone Encounter (Signed)
Reviewing care everywhere, she had lumbar MRI 03/2014 at [facility] showing partially imaged cystic lesion L kidney rec [place] to further evaluate.  I never received notice of this. Will order renal US to further evaluate then can write clarifying letter.

## 2015-12-31 ENCOUNTER — Ambulatory Visit
Admission: RE | Admit: 2015-12-31 | Discharge: 2015-12-31 | Disposition: A | Discharge status: Home / Self Care | Payer: Medicare Other | Source: Ambulatory Visit | Attending: Family Medicine | Admitting: Family Medicine

## 2015-12-31 DIAGNOSIS — N281 Cyst of kidney, acquired: Secondary | ICD-10-CM

## 2016-01-01 ENCOUNTER — Other Ambulatory Visit: Payer: Self-pay | Admitting: Family Medicine

## 2016-01-01 DIAGNOSIS — N281 Cyst of kidney, acquired: Secondary | ICD-10-CM | POA: Insufficient documentation

## 2016-01-04 ENCOUNTER — Telehealth: Payer: Self-pay | Admitting: Family Medicine

## 2016-01-04 NOTE — Telephone Encounter (Signed)
Patient called Melissa [name] with the fax number to her insurance company.  The fax number is [phone]: Melissa [name].

## 2016-01-04 NOTE — Telephone Encounter (Signed)
Noted  

## 2016-01-05 ENCOUNTER — Telehealth: Payer: Self-pay | Admitting: Family Medicine

## 2016-01-05 NOTE — Telephone Encounter (Signed)
Pt returned your call regarding a letter for the insurance company. They say they have not received the letter Please advise pt when this has been done

## 2016-01-05 NOTE — Telephone Encounter (Signed)
Dr. Darnell Level hasn't written the letter yet. Will call patient when it has been completed and faxed to insurance co.

## 2016-01-08 NOTE — Telephone Encounter (Signed)
Letter written and in chart 

## 2016-01-10 NOTE — Telephone Encounter (Signed)
Letter faxed and patient notified.

## 2016-01-14 ENCOUNTER — Ambulatory Visit
Admission: RE | Admit: 2016-01-14 | Discharge: 2016-01-14 | Disposition: A | Discharge status: Home / Self Care | Payer: Medicare Other | Source: Ambulatory Visit

## 2016-01-14 DIAGNOSIS — Z1231 Encounter for screening mammogram for malignant neoplasm of breast: Secondary | ICD-10-CM

## 2016-01-19 ENCOUNTER — Other Ambulatory Visit: Payer: Self-pay | Admitting: Family Medicine

## 2016-01-19 DIAGNOSIS — N281 Cyst of kidney, acquired: Secondary | ICD-10-CM

## 2016-02-08 ENCOUNTER — Encounter: Payer: PRIVATE HEALTH INSURANCE | Admitting: Gastroenterology

## 2016-02-10 ENCOUNTER — Encounter: Payer: PRIVATE HEALTH INSURANCE | Admitting: Gastroenterology

## 2016-02-12 ENCOUNTER — Other Ambulatory Visit: Payer: Self-pay | Admitting: Family Medicine

## 2016-02-14 ENCOUNTER — Other Ambulatory Visit: Payer: Self-pay | Admitting: Family Medicine

## 2016-03-16 ENCOUNTER — Encounter: Payer: Medicare Other | Admitting: Gastroenterology

## 2016-04-26 ENCOUNTER — Encounter: Payer: Self-pay | Admitting: Gastroenterology

## 2016-06-12 ENCOUNTER — Other Ambulatory Visit: Payer: Medicare Other

## 2016-06-15 ENCOUNTER — Emergency Department (HOSPITAL_COMMUNITY)
Admission: EM | Admit: 2016-06-15 | Discharge: 2016-06-15 | Disposition: A | Discharge status: Home / Self Care | Payer: Medicare Other | Attending: Emergency Medicine | Admitting: Emergency Medicine

## 2016-06-15 ENCOUNTER — Encounter (HOSPITAL_COMMUNITY): Payer: Self-pay | Admitting: Emergency Medicine

## 2016-06-15 ENCOUNTER — Emergency Department (HOSPITAL_COMMUNITY): Payer: Medicare Other

## 2016-06-15 DIAGNOSIS — Z7982 Long term (current) use of aspirin: Secondary | ICD-10-CM | POA: Insufficient documentation

## 2016-06-15 DIAGNOSIS — M79602 Pain in left arm: Secondary | ICD-10-CM

## 2016-06-15 DIAGNOSIS — Z79899 Other long term (current) drug therapy: Secondary | ICD-10-CM | POA: Insufficient documentation

## 2016-06-15 DIAGNOSIS — I10 Essential (primary) hypertension: Secondary | ICD-10-CM | POA: Insufficient documentation

## 2016-06-15 DIAGNOSIS — M25512 Pain in left shoulder: Secondary | ICD-10-CM | POA: Diagnosis present

## 2016-06-15 DIAGNOSIS — E119 Type 2 diabetes mellitus without complications: Secondary | ICD-10-CM | POA: Diagnosis not present

## 2016-06-15 MED ORDER — KETOROLAC TROMETHAMINE 30 MG/ML IJ SOLN
30.0000 mg | Freq: Once | INTRAMUSCULAR | Status: AC
  Administered 2016-06-15: 30 mg via INTRAMUSCULAR
  Filled 2016-06-15: qty 1

## 2016-06-15 MED ORDER — TRAMADOL HCL 50 MG PO TABS: 50.0000 mg | ORAL_TABLET | Freq: Four times a day (QID) | ORAL | 0 refills | Status: DC | PRN

## 2016-06-15 NOTE — ED Provider Notes (Signed)
[facility] DEPT Provider Note   CSN: [id number] Arrival date & time: 06/15/16  1219  By signing my name below, I, Soijett Blue, attest that this documentation has been prepared under the direction and in the presence of Albaro Deviney Y. Katielynn Horan, PA-C Electronically Signed: Soijett Blue, ED Scribe. 06/15/16. 1:11 PM.   History   Chief Complaint Chief Complaint  Patient presents with  . Neck Pain    left   . Shoulder Pain    HPI  Melissa [name] is a 65 y.o. female with a PMHx of arthritis and DM, who presents to the Emergency Department complaining of left sided neck pain and left arm onset 2 months. Pt reports that her left sided neck pain radiates to her left shoulder and left arm and is worsened with movement. Pt denies any alleviating factors for her left sided neck pain, left shoulder pain, or left arm pain. Pt notes that she fell two months ago while getting out of the bed and landing forward into the fetal position. Pt states that she was evaluated by her PCP and prescribed a muscle relaxer without an orthopedic referral for her symptoms. Pt is having associated symptoms of left shoulder pain and left arm pain. She notes that she has tried Rx muscle relaxer with no relief of her symptoms. She denies color change, wound, rash, swelling, and any other symptoms.    The history is provided by the patient. No language interpreter was used.    Past Medical History:  Diagnosis Date  . Arthritis    s/p R TKR  . Chest pain on exertion   . Cyst of left kidney 2015   by lumbar MRI pending renal US  . DDD (degenerative disc disease), lumbar 03/2014   severe L2-3 with L HNP with L2/3 nerve root impingement [name])  . Depression with anxiety   . Diabetes type 2, controlled ([place]) 2011   borderline  . Dyspnea   . Glaucoma   . History of ulcer disease   . HLD (hyperlipidemia)   . Hypertension     Patient Active Problem List   Diagnosis Date Noted  . Acquired renal cyst of left  kidney 01/01/2016  . Left ankle pain 05/04/2014  . Left ankle injury 05/04/2014  . DDD (degenerative disc disease), lumbar 03/04/2014  . Chest pain on exertion   . Dyspnea   . Chest pain 10/06/2013  . Plantar fasciitis of left foot 10/01/2013  . Other hammer toe (acquired) 08/20/2013  . IBS (irritable bowel syndrome) 05/13/2013  . Urinary frequency 05/13/2013  . Hammer toe, acquired 02/11/2013  . Metatarsalgia of both feet 02/11/2013  . Breast pain, left 11/13/2012  . Obesity 11/13/2012  . Glaucoma   . Type 2 diabetes mellitus, controlled ([place])   . Anxiety and depression   . HTN (hypertension) 01/06/2012  . GERD (gastroesophageal reflux disease) 01/06/2012  . Hypercholesteremia 01/06/2012    Past Surgical History:  Procedure Laterality Date  . ABDOMINAL HYSTERECTOMY  2007   fibroids, heavy bleeding  . TOTAL KNEE ARTHROPLASTY Right 2004   TKR (Dr. Eddie Dibbles with [facility])  . TOTAL KNEE REVISION Right 03/2014   Dr Al Corpus [facility]    OB History    Gravida Para Term Preterm AB Living   0         0   SAB TAB Ectopic Multiple Live Births                   Home Medications  Prior to Admission medications   Medication Sig Start Date End Date Taking? Authorizing Provider  ALPRAZolam Duanne Moron) 0.25 MG tablet TAKE 1 TABLET BY MOUTH EVERY NIGHT AT BEDTIME AS NEEDED 09/30/15   Ria Bush, MD  amLODipine (NORVASC) 5 MG tablet Take 1 tablet (5 mg total) by mouth daily. 02/09/15   Ria Bush, MD  aspirin EC 81 MG tablet Take 81 mg by mouth daily.    Historical Provider, MD  atorvastatin (LIPITOR) 40 MG tablet Take 1 tablet (40 mg total) by mouth daily. 08/05/14   Ria Bush, MD  citalopram (CELEXA) 10 MG tablet Take 1 tablet (10 mg total) by mouth daily. 03/17/15   Ria Bush, MD  dorzolamide-timolol (COSOPT) 22.3-6.8 MG/ML ophthalmic solution Place 1 drop into the left eye 2 (two) times daily. 11/13/14   Historical Provider, MD  glipiZIDE (GLIPIZIDE XL) 5 MG 24 hr  tablet Take 1-2 tablets (5-10 mg total) by mouth daily with breakfast. **MUST HAVE FOLLOW UP FOR FURTHER REFILLS** 02/14/16   Ria Bush, MD  hydrochlorothiazide (HYDRODIURIL) 12.5 MG tablet Take 1 tablet (12.5 mg total) by mouth daily. 02/09/15   Ria Bush, MD  ibuprofen (ADVIL,MOTRIN) 200 MG tablet Take 400 mg by mouth as needed. Pain    Historical Provider, MD  lisinopril-hydrochlorothiazide (ZESTORETIC) 20-12.5 MG per tablet Take 1 tablet by mouth at bedtime. In AM 02/09/15   Ria Bush, MD  Probiotic Product (ALIGN) 4 MG CAPS Take 1 capsule (4 mg total) by mouth daily. 12/02/13   Lutricia Feil, PA    Family History Family History  Problem Relation Age of Onset  . Cancer Maternal Grandmother 51    ovarian  . Diabetes Maternal Grandmother   . Hypertension Maternal Grandmother   . Cancer Mother 85    bone, MM  . Stroke Other     unsure who  . CAD Neg Hx     Social History Social History  Substance Use Topics  . Smoking status: Never Smoker  . Smokeless tobacco: Never Used  . Alcohol use No     Allergies   Codeine; Metformin and related; and Oxycodone   Review of Systems Review of Systems  Musculoskeletal: Positive for arthralgias (left shoulder and left arm) and neck pain (left sided). Negative for joint swelling.  Skin: Negative for color change, rash and wound.  All other systems reviewed and are negative.    Physical Exam Updated Vital Signs BP 127/88   Pulse 80   Temp 98.7 F (37.1 C) (Oral)   Resp 18   SpO2 99%   Physical Exam  Constitutional: She is oriented to person, place, and time. She appears well-developed and well-nourished. No distress.  HENT:  Head: Normocephalic and atraumatic.  Eyes: EOM are normal.  Neck: Neck supple.  Cardiovascular: Normal rate.   Pulses:      Radial pulses are 2+ on the left side.  Pulmonary/Chest: Effort normal. No respiratory distress.  Abdominal: She exhibits no distension.  Musculoskeletal:  Normal range of motion.       Left shoulder: She exhibits tenderness.       Cervical back: She exhibits no tenderness and no bony tenderness.       Thoracic back: She exhibits no tenderness and no bony tenderness.       Lumbar back: She exhibits no tenderness and no bony tenderness.  Point tenderness to left anterior shoulder. No tenderness throughout neck, C-spine, back, or remainder of left arm. Some limited ROM at left shoulder  due to pain. 5/5 strength BUE. 2+ radial pulse.   Neurological: She is alert and oriented to person, place, and time. She has normal strength.  Skin: Skin is warm and dry.  Psychiatric: She has a normal mood and affect. Her behavior is normal.  Nursing note and vitals reviewed.    ED Treatments / Results  DIAGNOSTIC STUDIES: Oxygen Saturation is 99% on RA, nl by my interpretation.    COORDINATION OF CARE: 1:05 PM Discussed treatment plan with pt at bedside which includes toradol injection and left shoulder xray and pt agreed to plan.   Radiology Dg Shoulder Left  Result Date: 06/15/2016 CLINICAL DATA:  Left shoulder pain for 1 month EXAM: LEFT SHOULDER - 2+ VIEW COMPARISON:  None. FINDINGS: Three views of the left shoulder submitted. No acute fracture or subluxation. Degenerative changes are noted acromioclavicular joint. Minimal inferior spurring of glenoid. IMPRESSION: No acute fracture or subluxation. Degenerative changes as described above. Electronically Signed   By: Lahoma Crocker M.D.   On: 06/15/2016 13:48    Procedures Procedures (including critical care time)  Medications Ordered in ED Medications  ketorolac (TORADOL) 30 MG/ML injection 30 mg (30 mg Intramuscular Given 06/15/16 1310)     Initial Impression / Assessment and Plan / ED Course  I have reviewed the triage vital signs and the nursing notes.  Pertinent imaging results that were available during my care of the patient were reviewed by me and considered in my medical decision making  (see chart for details).  Clinical Course    Some limited ROM due to pain. Strength and sensation intact. X-ray with some degenerative changes, no acute findings. Will prescribe course of tramadol. Encouraged ortho f/u. Pt has been to [facility] before. ER return precautions given.  Final Clinical Impressions(s) / ED Diagnoses   Final diagnoses:  Acute pain of left shoulder  Left arm pain    New Prescriptions Discharge Medication List as of 06/15/2016  2:03 PM    START taking these medications   Details  traMADol (ULTRAM) 50 MG tablet Take 1 tablet (50 mg total) by mouth every 6 (six) hours as needed., Starting Thu 06/15/2016, Print        I personally performed the services described in this documentation, which was scribed in my presence. The recorded information has been reviewed and is accurate.    Anne Ng, PA-C 06/15/16 1539    Duffy Bruce, MD 06/15/16 [phone]

## 2016-06-15 NOTE — ED Triage Notes (Signed)
Pt reports fall 1 month ago, landed forward. Pt was seen by PCP and was prescribed muscle relaxer yet no relief. Reports persistent pain  Neck left shoulder and left arm.

## 2016-06-15 NOTE — Discharge Instructions (Signed)
Take medication as prescribed. Follow up with [facility] as soon as possible. Return to the ER for new or worsening symptoms.

## 2016-06-17 ENCOUNTER — Emergency Department (HOSPITAL_COMMUNITY)
Admission: EM | Admit: 2016-06-17 | Discharge: 2016-06-17 | Disposition: A | Discharge status: Home / Self Care | Payer: Medicare Other | Attending: Emergency Medicine | Admitting: Emergency Medicine

## 2016-06-17 ENCOUNTER — Encounter (HOSPITAL_COMMUNITY): Payer: Self-pay | Admitting: *Deleted

## 2016-06-17 DIAGNOSIS — H5711 Ocular pain, right eye: Secondary | ICD-10-CM | POA: Diagnosis present

## 2016-06-17 DIAGNOSIS — Z96651 Presence of right artificial knee joint: Secondary | ICD-10-CM | POA: Insufficient documentation

## 2016-06-17 DIAGNOSIS — E119 Type 2 diabetes mellitus without complications: Secondary | ICD-10-CM | POA: Insufficient documentation

## 2016-06-17 DIAGNOSIS — Z7982 Long term (current) use of aspirin: Secondary | ICD-10-CM | POA: Diagnosis not present

## 2016-06-17 DIAGNOSIS — Z79899 Other long term (current) drug therapy: Secondary | ICD-10-CM | POA: Diagnosis not present

## 2016-06-17 DIAGNOSIS — I10 Essential (primary) hypertension: Secondary | ICD-10-CM | POA: Diagnosis not present

## 2016-06-17 DIAGNOSIS — Z7984 Long term (current) use of oral hypoglycemic drugs: Secondary | ICD-10-CM | POA: Diagnosis not present

## 2016-06-17 MED ORDER — FLUORESCEIN SODIUM 1 MG OP STRP
1.0000 | ORAL_STRIP | Freq: Once | OPHTHALMIC | Status: AC
  Administered 2016-06-17: 1 via OPHTHALMIC
  Filled 2016-06-17: qty 1

## 2016-06-17 MED ORDER — TETRACAINE HCL 0.5 % OP SOLN
2.0000 [drp] | Freq: Once | OPHTHALMIC | Status: AC
  Administered 2016-06-17: 2 [drp] via OPHTHALMIC
  Filled 2016-06-17: qty 4

## 2016-06-17 NOTE — ED Notes (Signed)
PA at bedside.

## 2016-06-17 NOTE — ED Provider Notes (Signed)
[facility] DEPT Provider Note   CSN: [id number] Arrival date & time: 06/17/16  1254     History   Chief Complaint Chief Complaint  Patient presents with  . Eye Injury    HPI Melissa [name] is a 65 y.o. female with a past medical history of glaucoma, DM who presents the ED today complaining of right eye pain. Patient states that last night she noticed that her right eye began to twitch in her vision became slightly foggier than normal. Today when she woke up she had severe right-sided eye pain with significant photosensitivity. Patient also noticed that her eye seemed more red than normal. She states that she had cataract and glaucoma surgery in May 2017 by Dr. Sherral Hammers. She has been taking timolol eyedrops as well as 2 other drops that she is unable to recall the moment. She states she attempted to call her eye doctor this morning when he is not at work right now. She denies any vomiting, headache, fevers, complete vision loss. No pain in left eye.  HPI  Past Medical History:  Diagnosis Date  . Arthritis    s/p R TKR  . Chest pain on exertion   . Cyst of left kidney 2015   by lumbar MRI pending renal US  . DDD (degenerative disc disease), lumbar 03/2014   severe L2-3 with L HNP with L2/3 nerve root impingement [name])  . Depression with anxiety   . Diabetes type 2, controlled ([place]) 2011   borderline  . Dyspnea   . Glaucoma   . History of ulcer disease   . HLD (hyperlipidemia)   . Hypertension     Patient Active Problem List   Diagnosis Date Noted  . Acquired renal cyst of left kidney 01/01/2016  . Left ankle pain 05/04/2014  . Left ankle injury 05/04/2014  . DDD (degenerative disc disease), lumbar 03/04/2014  . Chest pain on exertion   . Dyspnea   . Chest pain 10/06/2013  . Plantar fasciitis of left foot 10/01/2013  . Other hammer toe (acquired) 08/20/2013  . IBS (irritable bowel syndrome) 05/13/2013  . Urinary frequency 05/13/2013  . Hammer toe, acquired  02/11/2013  . Metatarsalgia of both feet 02/11/2013  . Breast pain, left 11/13/2012  . Obesity 11/13/2012  . Glaucoma   . Type 2 diabetes mellitus, controlled ([place])   . Anxiety and depression   . HTN (hypertension) 01/06/2012  . GERD (gastroesophageal reflux disease) 01/06/2012  . Hypercholesteremia 01/06/2012    Past Surgical History:  Procedure Laterality Date  . ABDOMINAL HYSTERECTOMY  2007   fibroids, heavy bleeding  . TOTAL KNEE ARTHROPLASTY Right 2004   TKR (Dr. Eddie Dibbles with [facility])  . TOTAL KNEE REVISION Right 03/2014   Dr Al Corpus [facility]    OB History    Gravida Para Term Preterm AB Living   0         0   SAB TAB Ectopic Multiple Live Births                   Home Medications    Prior to Admission medications   Medication Sig Start Date End Date Taking? Authorizing Provider  ALPRAZolam (XANAX) 0.25 MG tablet TAKE 1 TABLET BY MOUTH EVERY NIGHT AT BEDTIME AS NEEDED Patient taking differently: TAKE 1 TABLET BY MOUTH EVERY NIGHT AT BEDTIME AS NEEDED FOR SLEEP 09/30/15  Yes Ria Bush, MD  amLODipine (NORVASC) 5 MG tablet Take 5 mg by mouth daily. 05/29/16  Yes  Historical Provider, MD  aspirin EC 81 MG tablet Take 81 mg by mouth daily.   Yes Historical Provider, MD  atorvastatin (LIPITOR) 40 MG tablet Take 1 tablet (40 mg total) by mouth daily. Patient taking differently: Take 40 mg by mouth daily at 6 PM.  08/05/14  Yes Ria Bush, MD  citalopram (CELEXA) 10 MG tablet Take 1 tablet (10 mg total) by mouth daily. 03/17/15  Yes Ria Bush, MD  dorzolamide-timolol (COSOPT) 22.3-6.8 MG/ML ophthalmic solution Place 1 drop into both eyes 2 (two) times daily.  11/13/14  Yes Historical Provider, MD  glipiZIDE (GLIPIZIDE XL) 5 MG 24 hr tablet Take 1-2 tablets (5-10 mg total) by mouth daily with breakfast. **MUST HAVE FOLLOW UP FOR FURTHER REFILLS** 02/14/16  Yes Ria Bush, MD  ibuprofen (ADVIL,MOTRIN) 200 MG tablet Take 400 mg by mouth every 6 (six) hours as  needed for fever, headache, mild pain or moderate pain. Pain    Yes Historical Provider, MD  lisinopril-hydrochlorothiazide (ZESTORETIC) 20-12.5 MG per tablet Take 1 tablet by mouth at bedtime. In AM Patient taking differently: Take 1 tablet by mouth every morning. In AM 02/09/15  Yes Ria Bush, MD  traMADol (ULTRAM) 50 MG tablet Take 1 tablet (50 mg total) by mouth every 6 (six) hours as needed. Patient taking differently: Take 50 mg by mouth every 6 (six) hours as needed for moderate pain.  06/15/16  Yes Anne Ng, PA-C    Family History Family History  Problem Relation Age of Onset  . Cancer Maternal Grandmother 35    ovarian  . Diabetes Maternal Grandmother   . Hypertension Maternal Grandmother   . Cancer Mother 27    bone, MM  . Stroke Other     unsure who  . CAD Neg Hx     Social History Social History  Substance Use Topics  . Smoking status: Never Smoker  . Smokeless tobacco: Never Used  . Alcohol use No     Allergies   Codeine; Metformin and related; and Oxycodone   Review of Systems Review of Systems  All other systems reviewed and are negative.    Physical Exam Updated Vital Signs BP 110/82 (BP Location: Right Arm)   Pulse 85   Temp 98.3 F (36.8 C) (Oral)   Resp 16   SpO2 96%   Physical Exam  Constitutional: She is oriented to person, place, and time. She appears well-developed and well-nourished. No distress.  HENT:  Head: Normocephalic and atraumatic.  Eyes: Conjunctivae, EOM and lids are normal. Pupils are equal, round, and reactive to light. Lids are everted and swept, no foreign bodies found. Right eye exhibits no chemosis, no discharge, no exudate and no hordeolum. No foreign body present in the right eye. Left eye exhibits no discharge. No scleral icterus.  Slit lamp exam:      The right eye shows no corneal abrasion, no corneal ulcer, no fluorescein uptake and no anterior chamber bulge.  IOP R eye: 89mmHg IOP L eye: 15 mmHg  Normal  visual acuity b/l     Cardiovascular: Normal rate.   Pulmonary/Chest: Effort normal.  Neurological: She is alert and oriented to person, place, and time. Coordination normal.  Skin: Skin is warm and dry. No rash noted. She is not diaphoretic. No erythema. No pallor.  Psychiatric: She has a normal mood and affect. Her behavior is normal.  Nursing note and vitals reviewed.    ED Treatments / Results  Labs (all labs ordered are listed, but  only abnormal results are displayed) Labs Reviewed - No data to display  EKG  EKG Interpretation None       Radiology No results found.  Procedures Procedures (including critical care time)  Medications Ordered in ED Medications  fluorescein ophthalmic strip 1 strip (not administered)  tetracaine (PONTOCAINE) 0.5 % ophthalmic solution 2 drop (not administered)     Initial Impression / Assessment and Plan / ED Course  I have reviewed the triage vital signs and the nursing notes.  Pertinent labs & imaging results that were available during my care of the patient were reviewed by me and considered in my medical decision making (see chart for details).  Clinical Course    65 y.o F with a pmhx of glaucoma and cataracts presents to the ED today c/o right eye pain onset this morning. Pt very photosensitive, states this increases her eye pain. No visual changes. Visual acuity normal in ED. Given hx of glaucoma concern for acute angle glaucoma. However, IOP is 55mmHG in affected eye. Doubt acute angle given these findings. This exam was repeated 3 times to confirm true IOP. No fluoroscein uptake on exam. Pain improved while in the ED. Pt has timolol eye drops at home and good ophthalmology follow up with Dr. Sherral Hammers. Pt will see ophtho on Monday.  Case discussed with Dr. Lacinda Axon who agrees with treatment plan.  Final Clinical Impressions(s) / ED Diagnoses   Final diagnoses:  Pain of right eye    New Prescriptions New Prescriptions   No  medications on file     Carlos Levering, PA-C 06/18/16 1628    Nat Christen, MD 06/19/16 [id number]

## 2016-06-17 NOTE — Discharge Instructions (Signed)
Use home eye drops as prescribed. Follow up with your eye doctor as soon as possible for re-evaluation. Return to the ED if you experience severe worsening of your pain, decreased vision or complete vision loss, vomiting.

## 2016-06-17 NOTE — ED Triage Notes (Addendum)
Patient presents to ED from home alone (she drove herself) with right eye pain.  She fell approximately one month ago at home, landing on her face.  Patient was seen by her eye care provider and diagnosed with a "scratched eye" on right side.  She stated it started getting better, but over past day it has been increasingly painful and this morning it was even more painful.  Patient's eye provider is closed today.  Patient states she has had some mild nausea as well.  Patient has some scleral redness to right lateral sclera with photosensitivity.  She has pain with movement of right eye.  Patient has glaucoma in both eyes and had cataract surgery in right eye in May of this year with lens replacement.  Patient was seen here 2 days ago for left arm pain and her eye wasn't bothering her then.  Her left arm pain is improving.

## 2016-06-28 ENCOUNTER — Encounter: Payer: Medicare Other | Admitting: Gastroenterology

## 2016-06-29 ENCOUNTER — Encounter: Payer: Self-pay | Admitting: Family Medicine

## 2016-07-12 ENCOUNTER — Encounter: Payer: Self-pay | Admitting: Gastroenterology

## 2016-07-31 ENCOUNTER — Ambulatory Visit: Payer: PRIVATE HEALTH INSURANCE | Admitting: Podiatry

## 2016-08-02 ENCOUNTER — Ambulatory Visit: Payer: Medicare Other | Admitting: Podiatry

## 2016-08-09 ENCOUNTER — Ambulatory Visit: Payer: Medicare Other | Admitting: Podiatry

## 2016-09-12 ENCOUNTER — Other Ambulatory Visit: Payer: Self-pay | Admitting: Family Medicine

## 2016-09-13 ENCOUNTER — Encounter (HOSPITAL_COMMUNITY): Payer: Self-pay | Admitting: Emergency Medicine

## 2016-09-13 ENCOUNTER — Ambulatory Visit (HOSPITAL_COMMUNITY)
Admission: EM | Admit: 2016-09-13 | Discharge: 2016-09-13 | Disposition: A | Discharge status: Home / Self Care | Payer: Medicare Other | Attending: Family Medicine | Admitting: Family Medicine

## 2016-09-13 DIAGNOSIS — J069 Acute upper respiratory infection, unspecified: Secondary | ICD-10-CM

## 2016-09-13 DIAGNOSIS — B9789 Other viral agents as the cause of diseases classified elsewhere: Secondary | ICD-10-CM

## 2016-09-13 MED ORDER — IPRATROPIUM BROMIDE 0.06 % NA SOLN: 2.0000 | Freq: Four times a day (QID) | NASAL | 1 refills | Status: DC

## 2016-09-13 MED ORDER — DEXTROMETHORPHAN POLISTIREX ER 30 MG/5ML PO SUER: 60.0000 mg | Freq: Two times a day (BID) | ORAL | 1 refills | Status: DC

## 2016-09-13 MED ORDER — AZITHROMYCIN 250 MG PO TABS: ORAL_TABLET | ORAL | 0 refills | Status: DC

## 2016-09-13 NOTE — ED Provider Notes (Signed)
[place]    CSN: [id number] Arrival date & time: 09/13/16  1228     History   Chief Complaint Chief Complaint  Patient presents with  . Cough  . Toe Pain    HPI Xan JALAN BODI is a 66 y.o. female.   The history is provided by the patient.  Cough  Cough characteristics:  Productive Sputum characteristics:  Yellow Severity:  Mild Onset quality:  Gradual Duration:  2 weeks Progression:  Unchanged Chronicity:  New Smoker: no   Context: sick contacts and upper respiratory infection   Relieved by:  None tried Worsened by:  Nothing Ineffective treatments:  None tried Associated symptoms: rhinorrhea and sore throat   Associated symptoms: no fever, no shortness of breath and no wheezing   Toe Pain  Pertinent negatives include no shortness of breath.    Past Medical History:  Diagnosis Date  . Allergy   . Anxiety   . Arthritis    s/p R TKR  . Chest pain on exertion   . Cyst of left kidney 2015   by lumbar MRI pending renal US  . DDD (degenerative disc disease), lumbar 03/2014   severe L2-3 with L HNP with L2/3 nerve root impingement [name])  . Depression   . Depression with anxiety   . Diabetes type 2, controlled ([place]) 2011   borderline  . Dyspnea   . Glaucoma   . History of ulcer disease   . HLD (hyperlipidemia)    no meds taken  . Hypertension   . PONV (postoperative nausea and vomiting)   . Ulcer [facility])     Patient Active Problem List   Diagnosis Date Noted  . Acquired renal cyst of left kidney 01/01/2016  . Left ankle pain 05/04/2014  . Left ankle injury 05/04/2014  . DDD (degenerative disc disease), lumbar 03/04/2014  . Chest pain on exertion   . Dyspnea   . Chest pain 10/06/2013  . Plantar fasciitis of left foot 10/01/2013  . Other hammer toe (acquired) 08/20/2013  . IBS (irritable bowel syndrome) 05/13/2013  . Urinary frequency 05/13/2013  . Hammer toe, acquired 02/11/2013  . Metatarsalgia of both feet 02/11/2013  . Breast  pain, left 11/13/2012  . Obesity 11/13/2012  . Glaucoma   . Type 2 diabetes mellitus, controlled ([place])   . Anxiety and depression   . HTN (hypertension) 01/06/2012  . GERD (gastroesophageal reflux disease) 01/06/2012  . Hypercholesteremia 01/06/2012    Past Surgical History:  Procedure Laterality Date  . ABDOMINAL HYSTERECTOMY  2007   fibroids, heavy bleeding  . CATARACT EXTRACTION W/ INTRAOCULAR LENS IMPLANT Bilateral   . COLONOSCOPY    . GLAUCOMA SURGERY    . HAMMER TOE SURGERY Bilateral   . TONSILLECTOMY    . TOTAL KNEE ARTHROPLASTY Right 2004   TKR (Dr. Eddie Dibbles with [facility])  . TOTAL KNEE REVISION Right 03/2014   Dr Al Corpus [facility]    OB History    Gravida Para Term Preterm AB Living   0         0   SAB TAB Ectopic Multiple Live Births                   Home Medications    Prior to Admission medications   Medication Sig Start Date End Date Taking? Authorizing Provider  ALPRAZolam (XANAX) 0.25 MG tablet TAKE 1 TABLET BY MOUTH EVERY NIGHT AT BEDTIME AS NEEDED Patient taking differently: TAKE 1 TABLET BY MOUTH EVERY  NIGHT AT BEDTIME AS NEEDED FOR SLEEP 09/30/15  Yes Ria Bush, MD  amLODipine (NORVASC) 5 MG tablet Take 5 mg by mouth daily. 05/29/16  Yes Historical Provider, MD  aspirin EC 81 MG tablet Take 81 mg by mouth daily.   Yes Historical Provider, MD  glipiZIDE (GLIPIZIDE XL) 5 MG 24 hr tablet Take 1-2 tablets (5-10 mg total) by mouth daily with breakfast. **MUST HAVE FOLLOW UP FOR FURTHER REFILLS** 02/14/16  Yes Ria Bush, MD  lisinopril-hydrochlorothiazide (ZESTORETIC) 20-12.5 MG per tablet Take 1 tablet by mouth at bedtime. In AM Patient taking differently: Take 1 tablet by mouth every morning. In AM 02/09/15  Yes Ria Bush, MD  ALPHAGAN P 0.1 % SOLN 1 drop each eye three times daily 08/07/16   Historical Provider, MD  dorzolamide-timolol (COSOPT) 22.3-6.8 MG/ML ophthalmic solution 1 drop. Each eye every 12 hours 08/07/16   Historical Provider, MD    hydrochlorothiazide (MICROZIDE) 12.5 MG capsule daily. 08/31/16   Historical Provider, MD  IBUPROFEN PO Take by mouth as needed.    Historical Provider, MD  ipratropium (ATROVENT) 0.06 % nasal spray Place 2 sprays into both nostrils 4 (four) times daily. 09/13/16   Billy Fischer, MD  LINZESS 72 MCG capsule daily. 08/04/16   Historical Provider, MD  Na Sulfate-K Sulfate-Mg Sulf (SUPREP BOWEL PREP KIT) 17.5-3.13-1.6 GM/180ML SOLN Suprep as directed, no substitutions 09/25/16   Ladene Artist, MD    Family History Family History  Problem Relation Age of Onset  . Cancer Maternal Grandmother 28    ovarian  . Diabetes Maternal Grandmother   . Hypertension Maternal Grandmother   . Cancer Mother 64    bone, MM  . Stroke Other     unsure who  . CAD Neg Hx   . Colon cancer Neg Hx   . Esophageal cancer Neg Hx   . Rectal cancer Neg Hx   . Stomach cancer Neg Hx     Social History Social History  Substance Use Topics  . Smoking status: Never Smoker  . Smokeless tobacco: Never Used  . Alcohol use No     Allergies   Codeine; Metformin and related; and Oxycodone   Review of Systems Review of Systems  Constitutional: Negative.  Negative for fever.  HENT: Positive for congestion, postnasal drip, rhinorrhea and sore throat.   Respiratory: Positive for cough. Negative for shortness of breath and wheezing.   Cardiovascular: Negative.   Gastrointestinal: Negative.   Neurological: Negative.   All other systems reviewed and are negative.    Physical Exam Triage Vital Signs ED Triage Vitals  Enc Vitals Group     BP 09/13/16 1347 125/66     Pulse Rate 09/13/16 1347 77     Resp 09/13/16 1347 18     Temp 09/13/16 1347 98.8 F (37.1 C)     Temp Source 09/13/16 1347 Oral     SpO2 09/13/16 1347 100 %     Weight --      Height --      Head Circumference --      Peak Flow --      Pain Score 09/13/16 1352 6     Pain Loc --      Pain Edu? --      Excl. in [place]? --    No data  found.   Updated Vital Signs BP 125/66 (BP Location: Left Arm)   Pulse 77   Temp 98.8 F (37.1 C) (Oral)   Resp 18  SpO2 100%   Visual Acuity Right Eye Distance:   Left Eye Distance:   Bilateral Distance:    Right Eye Near:   Left Eye Near:    Bilateral Near:     Physical Exam  Constitutional: She is oriented to person, place, and time. She appears well-developed and well-nourished. No distress.  HENT:  Right Ear: External ear normal.  Left Ear: External ear normal.  Nose: Nose normal.  Mouth/Throat: Oropharynx is clear and moist.  Eyes: Pupils are equal, round, and reactive to light.  Neck: Normal range of motion.  Cardiovascular: Normal rate and regular rhythm.   Pulmonary/Chest: Effort normal and breath sounds normal.  Lymphadenopathy:    She has no cervical adenopathy.  Neurological: She is alert and oriented to person, place, and time.  Skin: Skin is warm and dry.  Nursing note and vitals reviewed.    UC Treatments / Results  Labs (all labs ordered are listed, but only abnormal results are displayed) Labs Reviewed - No data to display  EKG  EKG Interpretation None       Radiology No results found.  Procedures Procedures (including critical care time)  Medications Ordered in UC Medications - No data to display   Initial Impression / Assessment and Plan / UC Course  I have reviewed the triage vital signs and the nursing notes.  Pertinent labs & imaging results that were available during my care of the patient were reviewed by me and considered in my medical decision making (see chart for details).       Final Clinical Impressions(s) / UC Diagnoses   Final diagnoses:  Viral URI with cough    New Prescriptions Discharge Medication List as of 09/13/2016  3:14 PM    START taking these medications   Details  ipratropium (ATROVENT) 0.06 % nasal spray Place 2 sprays into both nostrils 4 (four) times daily., Starting Wed 09/13/2016, Print     azithromycin (ZITHROMAX Z-PAK) 250 MG tablet Take as directed on pack, Print    dextromethorphan (DELSYM) 30 MG/5ML liquid Take 10 mLs (60 mg total) by mouth 2 (two) times daily. Prn cough, Starting Wed 09/13/2016, Print         Billy Fischer, MD 10/01/16 2140

## 2016-09-13 NOTE — ED Triage Notes (Signed)
The patient presented to the [name] with a complaint of a sore throat, cough and congestion x 1 week that gets worse at night.  The patient also complained of pain to the second toe on her left foot secondary to stubbing it 3 weeks ago.

## 2016-09-13 NOTE — Discharge Instructions (Signed)
Drink plenty of fluids as discussed, use medicine as prescribed, and mucinex or delsym for cough. Return or see your doctor if further problems  use humidifier and plenty of fluids.

## 2016-09-25 ENCOUNTER — Ambulatory Visit (AMBULATORY_SURGERY_CENTER): Payer: Self-pay | Admitting: *Deleted

## 2016-09-25 VITALS — Ht 66.0 in | Wt 211.6 lb

## 2016-09-25 DIAGNOSIS — Z1211 Encounter for screening for malignant neoplasm of colon: Secondary | ICD-10-CM

## 2016-09-25 MED ORDER — NA SULFATE-K SULFATE-MG SULF 17.5-3.13-1.6 GM/177ML PO SOLN: ORAL | 0 refills | Status: DC

## 2016-09-25 NOTE — Progress Notes (Signed)
No egg or soy allergy  No anesthesia or intubation problems per pt  No diet medications taken  Registered in [place]  Pt is not a research pt  No hx of sleep apnea or home oxygen used

## 2016-09-28 ENCOUNTER — Telehealth: Payer: Self-pay | Admitting: Gastroenterology

## 2016-09-28 NOTE — Telephone Encounter (Signed)
No charge this time. 

## 2016-09-29 ENCOUNTER — Encounter: Payer: Medicare Other | Admitting: Gastroenterology

## 2016-10-25 ENCOUNTER — Ambulatory Visit: Payer: Medicare Other | Admitting: Family

## 2016-11-01 ENCOUNTER — Other Ambulatory Visit: Payer: Self-pay | Admitting: Family Medicine

## 2016-11-03 ENCOUNTER — Ambulatory Visit: Payer: Medicare Other | Admitting: Family

## 2016-11-24 ENCOUNTER — Ambulatory Visit: Payer: Medicare Other | Admitting: Family

## 2016-12-01 ENCOUNTER — Other Ambulatory Visit: Payer: Self-pay | Admitting: Family Medicine

## 2016-12-07 ENCOUNTER — Encounter (HOSPITAL_COMMUNITY): Payer: Self-pay | Admitting: Emergency Medicine

## 2016-12-07 ENCOUNTER — Ambulatory Visit (HOSPITAL_COMMUNITY)
Admission: EM | Admit: 2016-12-07 | Discharge: 2016-12-07 | Disposition: A | Discharge status: Home / Self Care | Payer: Medicare Other | Attending: Family Medicine | Admitting: Family Medicine

## 2016-12-07 DIAGNOSIS — J40 Bronchitis, not specified as acute or chronic: Secondary | ICD-10-CM

## 2016-12-07 MED ORDER — ALPRAZOLAM 0.25 MG PO TABS: 0.2500 mg | ORAL_TABLET | Freq: Every evening | ORAL | 1 refills | Status: DC | PRN

## 2016-12-07 MED ORDER — AZITHROMYCIN 250 MG PO TABS: 250.0000 mg | ORAL_TABLET | Freq: Every day | ORAL | 0 refills | Status: DC

## 2016-12-07 MED ORDER — LISINOPRIL-HYDROCHLOROTHIAZIDE 20-12.5 MG PO TABS: 1.0000 | ORAL_TABLET | Freq: Every morning | ORAL | 1 refills | Status: DC

## 2016-12-07 NOTE — ED Provider Notes (Signed)
[facility]    CSN: [id number] Arrival date & time: 12/07/16  1452     History   Chief Complaint Chief Complaint  Patient presents with  . URI    HPI [name] is a 66 y.o. female.   PT reports sore throat, non productive cough, and fevers at night for one week. She had a similar illness in January and was given a Z-Pak which worked very well for her.    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      Past Medical History:  Diagnosis Date  . Allergy   . Anxiety   . Arthritis    s/p R TKR  . Chest pain on exertion   . Cyst of left kidney 2015   by lumbar MRI pending renal US  . DDD (degenerative disc disease), lumbar 03/2014   severe L2-3 with L HNP with L2/3 nerve root impingement [name] @ [facility])  . Depression   . Depression with anxiety   . Diabetes type 2, controlled ([facility]) 2011   borderline  . Dyspnea   . Glaucoma   . History of ulcer disease   . HLD (hyperlipidemia)    no meds taken  . Hypertension   . PONV (postoperative nausea and vomiting)   . Ulcer [facility])     Patient Active Problem List   Diagnosis Date Noted  . Acquired renal cyst of left kidney 01/01/2016  . Left ankle pain 05/04/2014  . Left ankle injury 05/04/2014  . DDD (degenerative disc disease), lumbar 03/04/2014  . Chest pain on exertion   . Dyspnea   . Chest pain 10/06/2013  . Plantar fasciitis of left foot 10/01/2013  . Other hammer toe (acquired) 08/20/2013  . IBS (irritable bowel syndrome) 05/13/2013  . Urinary frequency 05/13/2013  . Hammer toe, acquired 02/11/2013  . Metatarsalgia of both feet 02/11/2013  . Breast pain, left 11/13/2012  . Obesity 11/13/2012  . Glaucoma   . Type 2 diabetes mellitus, controlled ([facility])   . Anxiety and depression   . HTN (hypertension) 01/06/2012  . GERD (gastroesophageal reflux disease) 01/06/2012  . Hypercholesteremia 01/06/2012    Past Surgical History:  Procedure Laterality Date  .  ABDOMINAL HYSTERECTOMY  2007   fibroids, heavy bleeding  . CATARACT EXTRACTION W/ INTRAOCULAR LENS IMPLANT Bilateral   . COLONOSCOPY    . GLAUCOMA SURGERY    . HAMMER TOE SURGERY Bilateral   . TONSILLECTOMY    . TOTAL KNEE ARTHROPLASTY Right 2004   TKR (Dr. Renae Fickle with [facility])  . TOTAL KNEE REVISION Right 03/2014   Dr Lamar Sprinkles [facility]    OB History    Gravida Para Term Preterm AB Living   0         0   SAB TAB Ectopic Multiple Live Births                   Home Medications    Prior to Admission medications   Medication Sig Start Date End Date Taking? Authorizing Provider  ALPHAGAN P 0.1 % SOLN 1 drop each eye three times daily 08/07/16   Historical Provider, MD  ALPRAZolam (XANAX) 0.25 MG tablet TAKE 1 TABLET BY MOUTH EVERY NIGHT AT BEDTIME AS NEEDED Patient taking differently: TAKE 1 TABLET BY MOUTH EVERY NIGHT AT BEDTIME AS NEEDED FOR SLEEP 09/30/15   Eustaquio Boyden, MD  amLODipine (NORVASC) 5 MG tablet Take 5 mg by mouth daily. 05/29/16   Historical  Provider, MD  aspirin EC 81 MG tablet Take 81 mg by mouth daily.    Historical Provider, MD  dorzolamide-timolol (COSOPT) 22.3-6.8 MG/ML ophthalmic solution 1 drop. Each eye every 12 hours 08/07/16   Historical Provider, MD  glipiZIDE (GLIPIZIDE XL) 5 MG 24 hr tablet Take 1-2 tablets (5-10 mg total) by mouth daily with breakfast. **MUST HAVE FOLLOW UP FOR FURTHER REFILLS** 02/14/16   Ria Bush, MD  hydrochlorothiazide (MICROZIDE) 12.5 MG capsule daily. 08/31/16   Historical Provider, MD  IBUPROFEN PO Take by mouth as needed.    Historical Provider, MD  ipratropium (ATROVENT) 0.06 % nasal spray Place 2 sprays into both nostrils 4 (four) times daily. 09/13/16   Billy Fischer, MD  LINZESS 72 MCG capsule daily. 08/04/16   Historical Provider, MD  lisinopril-hydrochlorothiazide (ZESTORETIC) 20-12.5 MG per tablet Take 1 tablet by mouth at bedtime. In AM Patient taking differently: Take 1 tablet by mouth every morning. In AM 02/09/15    Ria Bush, MD  Na Sulfate-K Sulfate-Mg Sulf [facility] BOWEL PREP KIT) 17.5-3.13-1.6 GM/180ML SOLN Suprep as directed, no substitutions 09/25/16   Ladene Artist, MD    Family History Family History  Problem Relation Age of Onset  . Cancer Maternal Grandmother 21    ovarian  . Diabetes Maternal Grandmother   . Hypertension Maternal Grandmother   . Cancer Mother 10    bone, MM  . Stroke Other     unsure who  . CAD Neg Hx   . Colon cancer Neg Hx   . Esophageal cancer Neg Hx   . Rectal cancer Neg Hx   . Stomach cancer Neg Hx     Social History Social History  Substance Use Topics  . Smoking status: Never Smoker  . Smokeless tobacco: Never Used  . Alcohol use No     Allergies   Codeine; Metformin and related; and Oxycodone   Review of Systems Review of Systems  Constitutional: Positive for chills and fever.  HENT: Positive for congestion, sneezing and sore throat.   Respiratory: Positive for cough.   Gastrointestinal: Negative.   Musculoskeletal: Negative.   Neurological: Negative.   Psychiatric/Behavioral: Negative.   All other systems reviewed and are negative.    Physical Exam Triage Vital Signs ED Triage Vitals  Enc Vitals Group     BP 12/07/16 1515 112/82     Pulse Rate 12/07/16 1512 91     Resp 12/07/16 1512 18     Temp 12/07/16 1512 98.4 F (36.9 C)     Temp Source 12/07/16 1512 Oral     SpO2 12/07/16 1512 98 %     Weight 12/07/16 1514 215 lb (97.5 kg)     Height 12/07/16 1514 _0  (1.676 m)     Head Circumference --      Peak Flow --      Pain Score 12/07/16 1513 0     Pain Loc --      Pain Edu? --      Excl. in [place]? --    No data found.   Updated Vital Signs BP 112/82   Pulse 91   Temp 98.4 F (36.9 C) (Oral)   Resp 18   Ht _1  (1.676 m)   Wt 215 lb (97.5 kg)   SpO2 98%   BMI 34.70 kg/m    Physical Exam  Constitutional: She is oriented to person, place, and time. She appears well-developed and well-nourished.  HENT:    Right Ear: External  ear normal.  Left Ear: External ear normal.  Mouth/Throat: Oropharynx is clear and moist.  Eyes: Conjunctivae and EOM are normal. Pupils are equal, round, and reactive to light.  Neck: Normal range of motion. Neck supple.  Cardiovascular: Normal rate, regular rhythm and normal heart sounds.   Pulmonary/Chest: Effort normal. She has wheezes.  Musculoskeletal: Normal range of motion.  Neurological: She is alert and oriented to person, place, and time.  Skin: Skin is warm and dry.  Nursing note and vitals reviewed.    UC Treatments / Results  Labs (all labs ordered are listed, but only abnormal results are displayed) Labs Reviewed - No data to display  EKG  EKG Interpretation None       Radiology No results found.  Procedures Procedures (including critical care time)  Medications Ordered in UC Medications - No data to display   Initial Impression / Assessment and Plan / UC Course  I have reviewed the triage vital signs and the nursing notes.  Pertinent labs & imaging results that were available during my care of the patient were reviewed by me and considered in my medical decision making (see chart for details).     Final Clinical Impressions(s) / UC Diagnoses   Final diagnoses:  None    New Prescriptions New Prescriptions   No medications on file     Robyn Haber, MD 12/07/16 1524

## 2016-12-07 NOTE — Discharge Instructions (Signed)
Use Robitussin-DM for nighttime cough

## 2016-12-07 NOTE — ED Triage Notes (Signed)
PT reports sore throat, non productive cough, and fevers at night for one week.

## 2016-12-18 ENCOUNTER — Ambulatory Visit: Payer: Medicare Other | Admitting: Family

## 2017-01-05 ENCOUNTER — Encounter (HOSPITAL_COMMUNITY): Payer: Self-pay | Admitting: Emergency Medicine

## 2017-01-05 ENCOUNTER — Ambulatory Visit (HOSPITAL_COMMUNITY)
Admission: EM | Admit: 2017-01-05 | Discharge: 2017-01-05 | Disposition: A | Discharge status: Home / Self Care | Payer: Medicare Other | Attending: Family Medicine | Admitting: Family Medicine

## 2017-01-05 DIAGNOSIS — M5431 Sciatica, right side: Secondary | ICD-10-CM | POA: Diagnosis not present

## 2017-01-05 DIAGNOSIS — M25512 Pain in left shoulder: Secondary | ICD-10-CM

## 2017-01-05 MED ORDER — CYCLOBENZAPRINE HCL 10 MG PO TABS: 10.0000 mg | ORAL_TABLET | Freq: Two times a day (BID) | ORAL | 0 refills | Status: DC | PRN

## 2017-01-05 MED ORDER — PREDNISONE 10 MG (21) PO TBPK: ORAL_TABLET | ORAL | 0 refills | Status: DC

## 2017-01-05 NOTE — ED Triage Notes (Signed)
The patient presented to the [facility] with a complaint of chronic back pain. 

## 2017-01-05 NOTE — ED Provider Notes (Signed)
CSN: [id number]     Arrival date & time 01/05/17  1710 History   None    Chief Complaint  Patient presents with  . Back Pain   (Consider location/radiation/quality/duration/timing/severity/associated sxs/prior Treatment) Patient c/o back pain radiating down her right leg to her knee and left shoulder disomfort.    Back Pain  Location:  Generalized Quality:  Aching Radiates to:  R posterior upper leg Pain severity:  Moderate Pain is:  Worse during the day Onset quality:  Sudden Duration:  2 days Timing:  Constant Progression:  Worsening Chronicity:  New Relieved by:  Nothing   Past Medical History:  Diagnosis Date  . Allergy   . Anxiety   . Arthritis    s/p R TKR  . Chest pain on exertion   . Cyst of left kidney 2015   by lumbar MRI pending renal US  . DDD (degenerative disc disease), lumbar 03/2014   severe L2-3 with L HNP with L2/3 nerve root impingement [name])  . Depression   . Depression with anxiety   . Diabetes type 2, controlled ([place]) 2011   borderline  . Dyspnea   . Glaucoma   . History of ulcer disease   . HLD (hyperlipidemia)    no meds taken  . Hypertension   . PONV (postoperative nausea and vomiting)   . Ulcer    Past Surgical History:  Procedure Laterality Date  . ABDOMINAL HYSTERECTOMY  2007   fibroids, heavy bleeding  . CATARACT EXTRACTION W/ INTRAOCULAR LENS IMPLANT Bilateral   . COLONOSCOPY    . GLAUCOMA SURGERY    . HAMMER TOE SURGERY Bilateral   . TONSILLECTOMY    . TOTAL KNEE ARTHROPLASTY Right 2004   TKR (Dr. Eddie Dibbles with [facility])  . TOTAL KNEE REVISION Right 03/2014   Dr Al Corpus [facility]   Family History  Problem Relation Age of Onset  . Cancer Maternal Grandmother 21    ovarian  . Diabetes Maternal Grandmother   . Hypertension Maternal Grandmother   . Cancer Mother 89    bone, MM  . Stroke Other     unsure who  . CAD Neg Hx   . Colon cancer Neg Hx   . Esophageal cancer Neg Hx   . Rectal cancer Neg Hx   . Stomach cancer  Neg Hx    Social History  Substance Use Topics  . Smoking status: Never Smoker  . Smokeless tobacco: Never Used  . Alcohol use No   OB History    Gravida Para Term Preterm AB Living   0         0   SAB TAB Ectopic Multiple Live Births                 Review of Systems  Constitutional: Negative.   HENT: Negative.   Eyes: Negative.   Respiratory: Negative.   Cardiovascular: Negative.   Gastrointestinal: Negative.   Endocrine: Negative.   Genitourinary: Negative.   Musculoskeletal: Positive for back pain.  Allergic/Immunologic: Negative.   Neurological: Negative.   Hematological: Negative.   Psychiatric/Behavioral: Negative.     Allergies  Codeine; Metformin and related; and Oxycodone  Home Medications   Prior to Admission medications   Medication Sig Start Date End Date Taking? Authorizing Provider  ALPHAGAN P 0.1 % SOLN 1 drop each eye three times daily 08/07/16   Historical Provider, MD  ALPRAZolam (XANAX) 0.25 MG tablet Take 1 tablet (0.25 mg total) by mouth at bedtime as  needed. for sleep 12/07/16   Robyn Haber, MD  amLODipine (NORVASC) 5 MG tablet Take 5 mg by mouth daily. 05/29/16   Historical Provider, MD  aspirin EC 81 MG tablet Take 81 mg by mouth daily.    Historical Provider, MD  azithromycin (ZITHROMAX) 250 MG tablet Take 1 tablet (250 mg total) by mouth daily. Take first 2 tablets together, then 1 every day until finished. 12/07/16   Robyn Haber, MD  cyclobenzaprine (FLEXERIL) 10 MG tablet Take 1 tablet (10 mg total) by mouth 2 (two) times daily as needed for muscle spasms. 01/05/17   Lysbeth Penner, FNP  dorzolamide-timolol (COSOPT) 22.3-6.8 MG/ML ophthalmic solution 1 drop. Each eye every 12 hours 08/07/16   Historical Provider, MD  glipiZIDE (GLIPIZIDE XL) 5 MG 24 hr tablet Take 1-2 tablets (5-10 mg total) by mouth daily with breakfast. **MUST HAVE FOLLOW UP FOR FURTHER REFILLS** 02/14/16   Ria Bush, MD  hydrochlorothiazide (MICROZIDE) 12.5 MG  capsule daily. 08/31/16   Historical Provider, MD  IBUPROFEN PO Take by mouth as needed.    Historical Provider, MD  ipratropium (ATROVENT) 0.06 % nasal spray Place 2 sprays into both nostrils 4 (four) times daily. 09/13/16   Billy Fischer, MD  LINZESS 72 MCG capsule daily. 08/04/16   Historical Provider, MD  lisinopril-hydrochlorothiazide (ZESTORETIC) 20-12.5 MG tablet Take 1 tablet by mouth every morning. In AM 12/07/16   Robyn Haber, MD  Na Sulfate-K Sulfate-Mg Sulf [facility] BOWEL PREP KIT) 17.5-3.13-1.6 GM/180ML SOLN Suprep as directed, no substitutions 09/25/16   Ladene Artist, MD  predniSONE (STERAPRED UNI-PAK 21 TAB) 10 MG (21) TBPK tablet Take 6-5-4-3-2-1 po qd 01/05/17   Lysbeth Penner, FNP   Meds Ordered and Administered this Visit  Medications - No data to display  BP 115/76 (BP Location: Right Arm)   Pulse 80   Temp 98.4 F (36.9 C) (Oral)   Resp 16   SpO2 99%  No data found.   Physical Exam  Constitutional: She appears well-developed and well-nourished.  HENT:  Head: Normocephalic and atraumatic.  Eyes: Conjunctivae and EOM are normal. Pupils are equal, round, and reactive to light.  Neck: Normal range of motion. Neck supple.  Cardiovascular: Normal rate, regular rhythm and normal heart sounds.   Pulmonary/Chest: Effort normal.  Musculoskeletal: She exhibits tenderness.  TTP right sciatic notch and left lumbar paraspinous muscles.  TTP left shoulder.  Nursing note and vitals reviewed.   Urgent Care Course     Procedures (including critical care time)  Labs Review Labs Reviewed - No data to display  Imaging Review No results found.   Visual Acuity Review  Right Eye Distance:   Left Eye Distance:   Bilateral Distance:    Right Eye Near:   Left Eye Near:    Bilateral Near:         MDM   1. Sciatica of right side   2. Acute pain of left shoulder    Naprosyn Flexeril    Lysbeth Penner, FNP 01/05/17 [id number]

## 2017-01-08 ENCOUNTER — Ambulatory Visit: Payer: Medicare Other | Admitting: Family

## 2017-01-25 ENCOUNTER — Encounter (HOSPITAL_COMMUNITY): Payer: Self-pay | Admitting: Emergency Medicine

## 2017-01-25 ENCOUNTER — Ambulatory Visit (HOSPITAL_COMMUNITY)
Admission: EM | Admit: 2017-01-25 | Discharge: 2017-01-25 | Disposition: A | Discharge status: Home / Self Care | Payer: Medicare Other | Attending: Family Medicine | Admitting: Family Medicine

## 2017-01-25 DIAGNOSIS — M5136 Other intervertebral disc degeneration, lumbar region: Secondary | ICD-10-CM

## 2017-01-25 DIAGNOSIS — G8929 Other chronic pain: Secondary | ICD-10-CM | POA: Diagnosis not present

## 2017-01-25 DIAGNOSIS — M5441 Lumbago with sciatica, right side: Secondary | ICD-10-CM | POA: Diagnosis not present

## 2017-01-25 MED ORDER — MELOXICAM 15 MG PO TABS: 15.0000 mg | ORAL_TABLET | Freq: Every day | ORAL | 0 refills | Status: DC

## 2017-01-25 NOTE — Discharge Instructions (Signed)
When you take the medicine take it with food. She can only be on this medicine for short. Of time. He can cause stomach pain, ulcers or gastritis and problems with your kidneys. It should be safe for just a short period of time. Sure you follow up with your new primary care provider at June 1 as scheduled.

## 2017-01-25 NOTE — ED Triage Notes (Signed)
Pt was seen here 20 days ago for chronic back pain.  She was prescribed Flexeril and Prednisone.  She states that she knows she needs back surgery (she has been seen by a specialist and has had MRI's done) but is trying to put the surgery off as long as possible.  Pt does report elevated BS with use of the prednisone three weeks ago.  Pt is wanting more prednisone today.  We discussed the issues with taking too much prednisone.

## 2017-01-25 NOTE — ED Provider Notes (Signed)
CSN: [id number]     Arrival date & time 01/25/17  1358 History   First MD Initiated Contact with Patient 01/25/17 1442     Chief Complaint  Patient presents with  . Back Pain   (Consider location/radiation/quality/duration/timing/severity/associated sxs/prior Treatment) Pleasant 66 year old female with a long history of severe DDD with HNP and nerve root involvement. She complains of persistent daily chronic back pain often associated with radiculopathy and sciatica. She was recently seen in this urgent care, approximately 3 weeks ago and treated with prednisone. She states this helped significantly and she would like to have enough tablets first and the next several months until she is able to see her specialist.      Past Medical History:  Diagnosis Date  . Allergy   . Anxiety   . Arthritis    s/p R TKR  . Chest pain on exertion   . Cyst of left kidney 2015   by lumbar MRI pending renal US  . DDD (degenerative disc disease), lumbar 03/2014   severe L2-3 with L HNP with L2/3 nerve root impingement [name])  . Depression   . Depression with anxiety   . Diabetes type 2, controlled ([place]) 2011   borderline  . Dyspnea   . Glaucoma   . History of ulcer disease   . HLD (hyperlipidemia)    no meds taken  . Hypertension   . PONV (postoperative nausea and vomiting)   . Ulcer    Past Surgical History:  Procedure Laterality Date  . ABDOMINAL HYSTERECTOMY  2007   fibroids, heavy bleeding  . CATARACT EXTRACTION W/ INTRAOCULAR LENS IMPLANT Bilateral   . COLONOSCOPY    . GLAUCOMA SURGERY    . HAMMER TOE SURGERY Bilateral   . TONSILLECTOMY    . TOTAL KNEE ARTHROPLASTY Right 2004   TKR (Dr. Eddie Dibbles with [facility])  . TOTAL KNEE REVISION Right 03/2014   Dr Al Corpus [facility]   Family History  Problem Relation Age of Onset  . Cancer Maternal Grandmother 24       ovarian  . Diabetes Maternal Grandmother   . Hypertension Maternal Grandmother   . Cancer Mother 48       bone, MM  . Stroke  Other        unsure who  . CAD Neg Hx   . Colon cancer Neg Hx   . Esophageal cancer Neg Hx   . Rectal cancer Neg Hx   . Stomach cancer Neg Hx    Social History  Substance Use Topics  . Smoking status: Never Smoker  . Smokeless tobacco: Never Used  . Alcohol use No   OB History    Gravida Para Term Preterm AB Living   0         0   SAB TAB Ectopic Multiple Live Births                 Review of Systems  Constitutional: Negative.  Negative for activity change, chills and fever.  HENT: Negative.   Respiratory: Negative.   Cardiovascular: Negative.   Musculoskeletal: Positive for back pain.       As per HPI  Skin: Negative for color change, pallor and rash.  Neurological: Negative.   All other systems reviewed and are negative.   Allergies  Codeine; Metformin and related; and Oxycodone  Home Medications   Prior to Admission medications   Medication Sig Start Date End Date Taking? Authorizing Provider  ALPHAGAN P 0.1 % SOLN  1 drop each eye three times daily 08/07/16   [provider]  ALPRAZolam (XANAX) 0.25 MG tablet Take 1 tablet (0.25 mg total) by mouth at bedtime as needed. for sleep 12/07/16   Robyn Haber, MD  amLODipine (NORVASC) 5 MG tablet Take 5 mg by mouth daily. 05/29/16   [provider]  aspirin EC 81 MG tablet Take 81 mg by mouth daily.    [provider]  cyclobenzaprine (FLEXERIL) 10 MG tablet Take 1 tablet (10 mg total) by mouth 2 (two) times daily as needed for muscle spasms. 01/05/17   Lysbeth Penner, FNP  dorzolamide-timolol (COSOPT) 22.3-6.8 MG/ML ophthalmic solution 1 drop. Each eye every 12 hours 08/07/16   [provider]  glipiZIDE (GLIPIZIDE XL) 5 MG 24 hr tablet Take 1-2 tablets (5-10 mg total) by mouth daily with breakfast. **MUST HAVE FOLLOW UP FOR FURTHER REFILLS** 02/14/16   Ria Bush, MD  hydrochlorothiazide (MICROZIDE) 12.5 MG capsule daily. 08/31/16   [provider]  IBUPROFEN PO Take by  mouth as needed.    [provider]  ipratropium (ATROVENT) 0.06 % nasal spray Place 2 sprays into both nostrils 4 (four) times daily. 09/13/16   Billy Fischer, MD  LINZESS 72 MCG capsule daily. 08/04/16   [provider]  lisinopril-hydrochlorothiazide (ZESTORETIC) 20-12.5 MG tablet Take 1 tablet by mouth every morning. In AM 12/07/16   Robyn Haber, MD  meloxicam (MOBIC) 15 MG tablet Take 1 tablet (15 mg total) by mouth daily. Take with food 01/25/17   Janne Napoleon, NP  Na Sulfate-K Sulfate-Mg Sulf (SUPREP BOWEL PREP KIT) 17.5-3.13-1.6 GM/180ML SOLN Suprep as directed, no substitutions 09/25/16   Ladene Artist, MD  predniSONE (STERAPRED UNI-PAK 21 TAB) 10 MG (21) TBPK tablet Take 6-5-4-3-2-1 po qd 01/05/17   Lysbeth Penner, FNP   Meds Ordered and Administered this Visit  Medications - No data to display  BP 109/77 (BP Location: Left Arm)   Pulse 83   Temp 97.7 F (36.5 C) (Oral)   SpO2 100%  No data found.   Physical Exam  Constitutional: She is oriented to person, place, and time. She appears well-developed and well-nourished. No distress.  HENT:  Head: Normocephalic and atraumatic.  Eyes: EOM are normal.  Neck: Neck supple.  Pulmonary/Chest: Effort normal.  Musculoskeletal:  Tenderness of the lumbar spine. Some obvious deformity. Patient tends to lean toward the left.  Neurological: She is alert and oriented to person, place, and time. No cranial nerve deficit.  Skin: Skin is warm and dry.  Psychiatric: She has a normal mood and affect.  Nursing note and vitals reviewed.   Urgent Care Course     Procedures (including critical care time)  Labs Review Labs Reviewed - No data to display  Imaging Review No results found.   Visual Acuity Review  Right Eye Distance:   Left Eye Distance:   Bilateral Distance:    Right Eye Near:   Left Eye Near:    Bilateral Near:         MDM   1. Chronic midline low back pain with right-sided sciatica    2. Degenerative disc disease, lumbar   The patient and provide her head along call regarding taking moderate to high doses of prednisone over a longer time. We discussed the potential side effects. She agreed that she did not want to do this. We discussed NSAIDs. She had been taking ibuprofen prior to the prednisone prescription but this does not work  for a well. The suggestion of using a different type of NSAID was accepted by the patient and we decided to use the below medication. It was also explained this to can cause problems with the stomach, ulcers, kidney problems. This will be on a very short-term basis only and all need to take food when taking this medication. When you take the medicine take it with food. She can only be on this medicine for short. Of time. He can cause stomach pain, ulcers or gastritis and problems with your kidneys. It should be safe for just a short period of time. Sure you follow up with your new primary care provider at June 1 as scheduled. Meds ordered this encounter  Medications  . meloxicam (MOBIC) 15 MG tablet    Sig: Take 1 tablet (15 mg total) by mouth daily. Take with food    Dispense:  14 tablet    Refill:  0    Order Specific Question:   Supervising Provider    Answer:   Robyn Haber [[id number]]       Janne Napoleon, NP 01/25/17 1515

## 2017-02-02 ENCOUNTER — Ambulatory Visit (INDEPENDENT_AMBULATORY_CARE_PROVIDER_SITE_OTHER): Payer: Medicare Other | Admitting: Family

## 2017-02-02 ENCOUNTER — Other Ambulatory Visit: Payer: Self-pay | Admitting: Family

## 2017-02-02 ENCOUNTER — Encounter: Payer: Self-pay | Admitting: Family

## 2017-02-02 VITALS — BP 126/80 | HR 81 | Temp 98.4°F | Resp 16 | Ht 66.0 in | Wt 215.4 lb

## 2017-02-02 DIAGNOSIS — M75100 Unspecified rotator cuff tear or rupture of unspecified shoulder, not specified as traumatic: Secondary | ICD-10-CM

## 2017-02-02 DIAGNOSIS — E119 Type 2 diabetes mellitus without complications: Secondary | ICD-10-CM

## 2017-02-02 DIAGNOSIS — F419 Anxiety disorder, unspecified: Secondary | ICD-10-CM

## 2017-02-02 DIAGNOSIS — M75112 Incomplete rotator cuff tear or rupture of left shoulder, not specified as traumatic: Secondary | ICD-10-CM | POA: Diagnosis not present

## 2017-02-02 DIAGNOSIS — F329 Major depressive disorder, single episode, unspecified: Secondary | ICD-10-CM

## 2017-02-02 DIAGNOSIS — F32A Depression, unspecified: Secondary | ICD-10-CM

## 2017-02-02 DIAGNOSIS — I10 Essential (primary) hypertension: Secondary | ICD-10-CM | POA: Diagnosis not present

## 2017-02-02 HISTORY — DX: Unspecified rotator cuff tear or rupture of unspecified shoulder, not specified as traumatic: M75.100

## 2017-02-02 MED ORDER — GLIPIZIDE ER 5 MG PO TB24: 5.0000 mg | ORAL_TABLET | Freq: Every day | ORAL | 0 refills | Status: DC

## 2017-02-02 MED ORDER — ALPRAZOLAM 0.25 MG PO TABS: 0.2500 mg | ORAL_TABLET | Freq: Every evening | ORAL | 1 refills | Status: DC | PRN

## 2017-02-02 MED ORDER — LISINOPRIL-HYDROCHLOROTHIAZIDE 20-12.5 MG PO TABS: 1.0000 | ORAL_TABLET | Freq: Every morning | ORAL | 5 refills | Status: DC

## 2017-02-02 NOTE — Assessment & Plan Note (Signed)
Symptoms and exam consistent with rotator cuff tear has confirmed by MRI and followed by orthopedics. Has failed conservative treatment with the recommendation for surgical intervention. Recently completed prednisone with minimal improvements. Continue follow-up and changes per orthopedics.

## 2017-02-02 NOTE — Assessment & Plan Note (Signed)
Blood pressure well-controlled and below goal 140/90 with current medication regimen. Discontinue amlodipine and extra dose of hydrochlorothiazide. Continue lisinopril-hydrochlorothiazide. Denies worst headache of life with no new symptoms of end organ damage noted on physical exam. Encouraged to monitor blood pressure at home and follow low-sodium diet.

## 2017-02-02 NOTE — Assessment & Plan Note (Signed)
Stable with current medication regimen and no adverse side effects or suicidal ideations. Sleeping well with well-controlled anxiety with current medication regimen. Continue current dosage of alprazolam as needed. [place] controlled substance database reviewed with no irregularities.

## 2017-02-02 NOTE — Patient Instructions (Addendum)
Thank you for choosing [facility].  SUMMARY AND INSTRUCTIONS:  Please continue to take the linisinopril-hydrochlorothiazide  STOP taking the additional hydrochlorothiazide and amlodipine  Continue to monitor your blood pressure at home and follow low sodium intake.   Follow up for pre-surgical clearance.    Monitor your blood sugars at home 3-4 times per week.    Medication:  Your prescription(s) have been submitted to your pharmacy or been printed and provided for you. Please take as directed and contact our office if you believe you are having problem(s) with the medication(s) or have any questions.  Labs:  Please stop by the lab on the lower level of the building for your blood work. Your results will be released to [place] (or called to you) after review, usually within 72 hours after test completion. If any changes need to be made, you will be notified at that same time.  1.) The lab is open from 7:30am to 5:30 pm Monday-Friday 2.) No appointment is necessary 3.) Fasting (if needed) is 6-8 hours after food and drink; black coffee and water are okay   Follow up:  If your symptoms worsen or fail to improve, please contact our office for further instruction, or in case of emergency go directly to the emergency room at the closest medical facility.    DIABETES CARE INSTRUCTIONS:  Current A1c:  Lab Results  Component Value Date   HGBA1C 6.8 (H) 02/09/2015    A1C Goal: <7.0%  Fasting Blood Sugar Goal: 80-130 Blood sugar check that you take after fasting for at least 8 hours which is usually in the morning before eating.  Post-prandial Blood Sugar Goal: <180 Blood sugar check approximately 1-2 hours after the start of a meal.    Diabetes Prevention Screens:  1.) Ensure you have an annual eye exam by an ophthalmologist or optometrist.   2,) Ensure you have an annual foot exam or when any changes are noted including new onset numbness/tingling or wounds. Check  your feet daily!  3.) The American Diabetes Association recommends the Pneumovax vaccination against pneumonia every 5 years.  4.) We will check your kidney function with a urine test on an annual basis.

## 2017-02-02 NOTE — Assessment & Plan Note (Signed)
Obtain hemoglobin A1c, urine microalbumin, and complete metabolic profile. Continue current dosage of glipizide pending A1c results. Diabetic foot exam completed today. Diabetic eye exam is up-to-date per recommendations. Maintained on lisinopril for CAD risk reduction. Pneumovax is up-to-date per recommendations. Follow up pending A1c results.

## 2017-02-02 NOTE — Progress Notes (Signed)
Subjective:    Patient ID: Melissa [name], female    DOB: 08-21-1951, 66 y.o.   MRN: [id number]  Chief Complaint  Patient presents with  . Establish Care    Talk about blood presssure medicine, left shoulder problems, torn rotator cuff    HPI:  Melissa [name] is a 66 y.o. female who  has a past medical history of Allergy; Anxiety; Arthritis; Chest pain on exertion; Cyst of left kidney (2015); DDD (degenerative disc disease), lumbar (03/2014); Depression; Depression with anxiety; Diabetes type 2, controlled ([place]) (2011); Dyspnea; Glaucoma; History of ulcer disease; HLD (hyperlipidemia); Hypertension; PONV (postoperative nausea and vomiting); and Ulcer. and presents today for an office visit to establish care.  1.) Hypertension -  Currently maintained on Amlodipine, hydrochlorothiazide, and lisinopril-hydrochlorothiazide. and reports taking the medications as prescribed and denies adverse side effects or hypotensive readings. Blood pressures at home are well controlled. Denies changes in vision, worst headache of life or new symptoms of end organ damage. Working on following a low sodium diet.  BP Readings from Last 3 Encounters:  02/02/17 126/80  01/25/17 109/77  01/05/17 115/76   2.) Shoulder - This is a new problem. Recently evaluated at [facility] and completed an MRI that showed a torn rotator cuff. Pains are described as dull and achy and there is some radiating pain to her neck at times. Modifying factors include prednisone taper which she has completed and massage. Has not completed physical therapy and the recommendation is for surgical repair.   3.) Diabetes - Currently maintained on glipizide. Reports taking the medication as prescribed and denies adverse side effects or hypoglycemic readings. Blood sugars at home have been elevated secondary to prednisone for her shoulder. . Denies new symptoms of end organ damage. No excessive hunger, thirst, or urination. Working  on a low/carbohydrate modified oral intake. Physical activity  Lab Results  Component Value Date   HGBA1C 6.8 (H) 02/09/2015     Lab Results  Component Value Date   CREATININE 0.75 02/09/2015   BUN 18 02/09/2015   NA 135 02/09/2015   K 3.7 02/09/2015   CL 99 02/09/2015   CO2 27 02/09/2015    4.) Anxiety / Sleep - Currently maintained on alprazolam. Reports taken medications prescribed and denies adverse side effects. Using the medication on an as-needed basis to help with sleep and anxiety. Symptoms are generally well controlled with the current medication regimen.    Allergies  Allergen Reactions  . Codeine Other (See Comments)    palpitations  . Metformin And Related Nausea Only  . Oxycodone Nausea And Vomiting      Outpatient Medications Prior to Visit  Medication Sig Dispense Refill  . ALPHAGAN P 0.1 % SOLN 1 drop each eye three times daily  3  . aspirin EC 81 MG tablet Take 81 mg by mouth daily.    . cyclobenzaprine (FLEXERIL) 10 MG tablet Take 1 tablet (10 mg total) by mouth 2 (two) times daily as needed for muscle spasms. 20 tablet 0  . dorzolamide-timolol (COSOPT) 22.3-6.8 MG/ML ophthalmic solution 1 drop. Each eye every 12 hours  3  . IBUPROFEN PO Take by mouth as needed.    . ALPRAZolam (XANAX) 0.25 MG tablet Take 1 tablet (0.25 mg total) by mouth at bedtime as needed. for sleep 30 tablet 1  . glipiZIDE (GLIPIZIDE XL) 5 MG 24 hr tablet Take 1-2 tablets (5-10 mg total) by mouth daily with breakfast. **MUST HAVE FOLLOW UP FOR FURTHER REFILLS**  60 tablet 0  . lisinopril-hydrochlorothiazide (ZESTORETIC) 20-12.5 MG tablet Take 1 tablet by mouth every morning. In AM 30 tablet 1  . meloxicam (MOBIC) 15 MG tablet Take 1 tablet (15 mg total) by mouth daily. Take with food 14 tablet 0  . amLODipine (NORVASC) 5 MG tablet Take 5 mg by mouth daily.    . hydrochlorothiazide (MICROZIDE) 12.5 MG capsule daily.  5  . ipratropium (ATROVENT) 0.06 % nasal spray Place 2 sprays into  both nostrils 4 (four) times daily. 15 mL 1  . LINZESS 72 MCG capsule daily.    . Na Sulfate-K Sulfate-Mg Sulf (SUPREP BOWEL PREP KIT) 17.5-3.13-1.6 GM/180ML SOLN Suprep as directed, no substitutions 354 mL 0  . predniSONE (STERAPRED UNI-PAK 21 TAB) 10 MG (21) TBPK tablet Take 6-5-4-3-2-1 po qd 21 tablet 0   No facility-administered medications prior to visit.      Past Medical History:  Diagnosis Date  . Allergy   . Anxiety   . Arthritis    s/p R TKR  . Chest pain on exertion   . Cyst of left kidney 2015   by lumbar MRI pending renal US  . DDD (degenerative disc disease), lumbar 03/2014   severe L2-3 with L HNP with L2/3 nerve root impingement [name])  . Depression   . Depression with anxiety   . Diabetes type 2, controlled ([place]) 2011   borderline  . Dyspnea   . Glaucoma   . History of ulcer disease   . HLD (hyperlipidemia)    no meds taken  . Hypertension   . PONV (postoperative nausea and vomiting)   . Ulcer       Past Surgical History:  Procedure Laterality Date  . ABDOMINAL HYSTERECTOMY  2007   fibroids, heavy bleeding  . CATARACT EXTRACTION W/ INTRAOCULAR LENS IMPLANT Bilateral   . COLONOSCOPY    . GLAUCOMA SURGERY    . HAMMER TOE SURGERY Bilateral   . TONSILLECTOMY    . TOTAL KNEE ARTHROPLASTY Right 2004   TKR (Dr. Eddie Dibbles with [facility])  . TOTAL KNEE REVISION Right 03/2014   Dr Al Corpus [facility]      Family History  Problem Relation Age of Onset  . Cancer Maternal Grandmother 30       ovarian  . Diabetes Maternal Grandmother   . Hypertension Maternal Grandmother   . Cancer Mother 48       bone, MM  . Stroke Other        unsure who  . CAD Neg Hx   . Colon cancer Neg Hx   . Esophageal cancer Neg Hx   . Rectal cancer Neg Hx   . Stomach cancer Neg Hx       Social History   Social History  . Marital status: Married    Spouse name: N/A  . Number of children: 0  . Years of education: 48   Occupational History  . work with disabilities  [employer]   Social History Main Topics  . Smoking status: Never Smoker  . Smokeless tobacco: Never Used  . Alcohol use No  . Drug use: No  . Sexual activity: Not Currently    Birth control/ protection: Surgical   Other Topics Concern  . Not on file   Social History Narrative   Caffeine: rare   Lives alone   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[employer]   Fun/Hobbies: Gardening       Review of Systems  Constitutional: Negative  for chills, fatigue and fever.  Eyes:       Denies changes in vision  Respiratory: Negative for cough, chest tightness, shortness of breath and wheezing.   Cardiovascular: Negative for chest pain, palpitations and leg swelling.  Endocrine: Negative for polydipsia, polyphagia and polyuria.  Neurological: Negative for dizziness, weakness, light-headedness and numbness.  Psychiatric/Behavioral: Negative for decreased concentration, dysphoric mood, sleep disturbance and suicidal ideas. The patient is not nervous/anxious.        Objective:    BP 126/80 (BP Location: Left Arm, Patient Position: Sitting, Cuff Size: Large)   Pulse 81   Temp 98.4 F (36.9 C) (Oral)   Resp 16   Ht 5' 6"  (1.676 m)   Wt 215 lb 6.4 oz (97.7 kg)   SpO2 96%   BMI 34.77 kg/m  Nursing note and vital signs reviewed.  Physical Exam  Constitutional: She is oriented to person, place, and time. She appears well-developed and well-nourished. No distress.  Cardiovascular: Normal rate, regular rhythm, normal heart sounds and intact distal pulses.   Pulmonary/Chest: Effort normal and breath sounds normal.  Musculoskeletal:  Left shoulder - no obvious deformity, discoloration, or edema. Palpable tenderness along subacromial space and rotator cuff tendons. Range of motion restricted in flexion and abduction to 90. Strength is 3+. Distal pulses and sensation are intact and appropriate. Positive empty can; negative Neer's impingement; positive Michel Bickers.    Neurological: She is alert and oriented to person, place, and time.  Skin: Skin is warm and dry.  Psychiatric: She has a normal mood and affect. Her behavior is normal. Judgment and thought content normal.        Assessment & Plan:   Problem List Items Addressed This Visit      Cardiovascular and Mediastinum   HTN (hypertension) - Primary (Chronic)    Blood pressure well-controlled and below goal 140/90 with current medication regimen. Discontinue amlodipine and extra dose of hydrochlorothiazide. Continue lisinopril-hydrochlorothiazide. Denies worst headache of life with no new symptoms of end organ damage noted on physical exam. Encouraged to monitor blood pressure at home and follow low-sodium diet.      Relevant Medications   lisinopril-hydrochlorothiazide (ZESTORETIC) 20-12.5 MG tablet     Endocrine   Type 2 diabetes mellitus, controlled (HCC)    Obtain hemoglobin A1c, urine microalbumin, and complete metabolic profile. Continue current dosage of glipizide pending A1c results. Diabetic foot exam completed today. Diabetic eye exam is up-to-date per recommendations. Maintained on lisinopril for CAD risk reduction. Pneumovax is up-to-date per recommendations. Follow up pending A1c results.       Relevant Medications   glipiZIDE (GLIPIZIDE XL) 5 MG 24 hr tablet   lisinopril-hydrochlorothiazide (ZESTORETIC) 20-12.5 MG tablet   Other Relevant Orders   Hemoglobin A1c   Urine Microalbumin w/creat. ratio   Comprehensive metabolic panel     Musculoskeletal and Integument   Rotator cuff tear    Symptoms and exam consistent with rotator cuff tear has confirmed by MRI and followed by orthopedics. Has failed conservative treatment with the recommendation for surgical intervention. Recently completed prednisone with minimal improvements. Continue follow-up and changes per orthopedics.        Other   Anxiety and depression    Stable with current medication regimen and no adverse side  effects or suicidal ideations. Sleeping well with well-controlled anxiety with current medication regimen. Continue current dosage of alprazolam as needed. [place] controlled substance database reviewed with no irregularities.  I have discontinued Ms. Day's amLODipine, ipratropium, hydrochlorothiazide, LINZESS, Na Sulfate-K Sulfate-Mg Sulf, predniSONE, and meloxicam. I have also changed her glipiZIDE. Additionally, I am having her maintain her aspirin EC, dorzolamide-timolol, ALPHAGAN P, IBUPROFEN PO, cyclobenzaprine, lisinopril-hydrochlorothiazide, and ALPRAZolam.   Meds ordered this encounter  Medications  . glipiZIDE (GLIPIZIDE XL) 5 MG 24 hr tablet    Sig: Take 1-2 tablets (5-10 mg total) by mouth daily with breakfast.    Dispense:  60 tablet    Refill:  0    Order Specific Question:   Supervising Provider    Answer:   Pricilla Holm A [[id number]]  . lisinopril-hydrochlorothiazide (ZESTORETIC) 20-12.5 MG tablet    Sig: Take 1 tablet by mouth every morning. In AM    Dispense:  30 tablet    Refill:  5    Order Specific Question:   Supervising Provider    Answer:   Pricilla Holm A [[id number]]  . ALPRAZolam (XANAX) 0.25 MG tablet    Sig: Take 1 tablet (0.25 mg total) by mouth at bedtime as needed. for sleep    Dispense:  30 tablet    Refill:  1    Order Specific Question:   Supervising Provider    Answer:   Pricilla Holm A [[id number]]     Follow-up: Return in about 3 months (around 05/05/2017), or if symptoms worsen or fail to improve.  Mauricio Po, FNP

## 2017-02-28 ENCOUNTER — Other Ambulatory Visit: Payer: Self-pay | Admitting: Family

## 2017-02-28 ENCOUNTER — Telehealth: Payer: Self-pay | Admitting: Family

## 2017-02-28 NOTE — Telephone Encounter (Signed)
Pt called in and would like a refill on her glipiZIDE (GLIPIZIDE XL) 5 MG 24 hr tablet [[id number]]

## 2017-03-01 MED ORDER — GLIPIZIDE ER 5 MG PO TB24: 5.0000 mg | ORAL_TABLET | Freq: Every day | ORAL | 3 refills | Status: DC

## 2017-03-01 NOTE — Telephone Encounter (Signed)
Sent refill electronically to [facility]...Johny Chess

## 2017-03-08 ENCOUNTER — Encounter: Payer: Self-pay | Admitting: Gastroenterology

## 2017-03-08 ENCOUNTER — Other Ambulatory Visit: Payer: Self-pay | Admitting: Family

## 2017-03-08 ENCOUNTER — Other Ambulatory Visit (INDEPENDENT_AMBULATORY_CARE_PROVIDER_SITE_OTHER): Payer: Medicare Other

## 2017-03-08 DIAGNOSIS — E119 Type 2 diabetes mellitus without complications: Secondary | ICD-10-CM

## 2017-03-08 DIAGNOSIS — Z1231 Encounter for screening mammogram for malignant neoplasm of breast: Secondary | ICD-10-CM

## 2017-03-08 LAB — COMPREHENSIVE METABOLIC PANEL
ALT: 12 U/L (ref 0–35)
AST: 11 U/L (ref 0–37)
Albumin: 4 g/dL (ref 3.5–5.2)
Alkaline Phosphatase: 89 U/L (ref 39–117)
BUN: 13 mg/dL (ref 6–23)
CO2: 28 mEq/L (ref 19–32)
Calcium: 9.7 mg/dL (ref 8.4–10.5)
Chloride: 103 mEq/L (ref 96–112)
Creatinine, Ser: 0.66 mg/dL (ref 0.40–1.20)
GFR: 115.07 mL/min (ref >60.00)
Glucose, Bld: 111 mg/dL — ABNORMAL HIGH (ref 70–99)
Potassium: 3.5 mEq/L (ref 3.5–5.1)
Sodium: 140 mEq/L (ref 135–145)
Total Bilirubin: 0.4 mg/dL (ref 0.2–1.2)
Total Protein: 7.7 g/dL (ref 6.0–8.3)

## 2017-03-08 LAB — MICROALBUMIN / CREATININE URINE RATIO
Creatinine,U: 210.3 mg/dL
Microalb Creat Ratio: 0.8 mg/g (ref 0.0–30.0)
Microalb, Ur: 1.7 mg/dL (ref 0.0–1.9)

## 2017-03-08 LAB — HEMOGLOBIN A1C: Hgb A1c MFr Bld: 6.4 % (ref 4.6–6.5)

## 2017-03-14 ENCOUNTER — Ambulatory Visit
Admission: RE | Admit: 2017-03-14 | Discharge: 2017-03-14 | Disposition: A | Discharge status: Home / Self Care | Payer: Medicare Other | Source: Ambulatory Visit | Attending: Family | Admitting: Family

## 2017-03-14 DIAGNOSIS — Z1231 Encounter for screening mammogram for malignant neoplasm of breast: Secondary | ICD-10-CM

## 2017-03-16 ENCOUNTER — Encounter (HOSPITAL_COMMUNITY): Payer: Self-pay | Admitting: Emergency Medicine

## 2017-03-16 ENCOUNTER — Emergency Department (HOSPITAL_COMMUNITY)
Admission: EM | Admit: 2017-03-16 | Discharge: 2017-03-16 | Disposition: A | Discharge status: Home / Self Care | Payer: Medicare Other | Attending: Emergency Medicine | Admitting: Emergency Medicine

## 2017-03-16 DIAGNOSIS — Z7982 Long term (current) use of aspirin: Secondary | ICD-10-CM | POA: Insufficient documentation

## 2017-03-16 DIAGNOSIS — I10 Essential (primary) hypertension: Secondary | ICD-10-CM | POA: Insufficient documentation

## 2017-03-16 DIAGNOSIS — H5713 Ocular pain, bilateral: Secondary | ICD-10-CM | POA: Diagnosis present

## 2017-03-16 DIAGNOSIS — Z79899 Other long term (current) drug therapy: Secondary | ICD-10-CM | POA: Insufficient documentation

## 2017-03-16 DIAGNOSIS — E119 Type 2 diabetes mellitus without complications: Secondary | ICD-10-CM | POA: Diagnosis not present

## 2017-03-16 DIAGNOSIS — H578 Other specified disorders of eye and adnexa: Secondary | ICD-10-CM | POA: Insufficient documentation

## 2017-03-16 DIAGNOSIS — H5789 Other specified disorders of eye and adnexa: Secondary | ICD-10-CM

## 2017-03-16 MED ORDER — TETRACAINE HCL 0.5 % OP SOLN
2.0000 [drp] | Freq: Once | OPHTHALMIC | Status: AC
  Administered 2017-03-16: 2 [drp] via OPHTHALMIC
  Filled 2017-03-16: qty 8

## 2017-03-16 MED ORDER — BRIMONIDINE TARTRATE 0.1 % OP SOLN: 1.0000 [drp] | Freq: Three times a day (TID) | OPHTHALMIC | 0 refills | Status: DC

## 2017-03-16 MED ORDER — ERYTHROMYCIN 5 MG/GM OP OINT: TOPICAL_OINTMENT | OPHTHALMIC | 0 refills | Status: DC

## 2017-03-16 MED ORDER — FLUORESCEIN SODIUM 0.6 MG OP STRP
1.0000 | ORAL_STRIP | Freq: Once | OPHTHALMIC | Status: AC
  Administered 2017-03-16: 1 via OPHTHALMIC
  Filled 2017-03-16: qty 1

## 2017-03-16 NOTE — ED Notes (Signed)
Pt states that she has been having red and irritated eye bilateral x 2 day, with crusty drainage noted. Pt visual acuity test performed and showed Rt Eye was Lt eye 20/70.

## 2017-03-16 NOTE — ED Notes (Signed)
Pt visual acuity recheck with corrective lense Rt eye 20/25. Lt eye 20/20

## 2017-03-16 NOTE — ED Triage Notes (Signed)
Pt c/o bilateral eye redness, pruritis, burning, tearing, gritty sensation, tan crusts worse in morning but eyes do not stick shut in morning. No foreign body sensation. No pain with EOMs or pupillary consensual response to light. No ciliary flush.

## 2017-03-16 NOTE — ED Provider Notes (Signed)
[facility] DEPT Provider Note   CSN: [id number] Arrival date & time: 03/16/17  1810     History   Chief Complaint Chief Complaint  Patient presents with  . Conjunctivitis    HPI Melissa [name] is a 65 y.o. female.  HPI   Melissa [name] is a 66 y.o. female, with a history of DM, hyperlipidemia, and glaucoma, presenting to the ED with bilateral eye redness and discomfort for the last 3-4 days. Patient endorses a "gritty" feeling in the eyes along with tan colored crusting in the mornings, "just a little more than usual." Also states, "My eyeballs feel tight."  Has tried an OTC eye wash without relief. Her optimal pressures are around 10.  With increased pressure she states she usually has photophobia, increase in globe size, and difficulty focusing. She does not use contact lenses. Denies fever/chills, vision changes, photophobia, N/V, dizziness, or any other complaints.   Last dose of the Cosopt opthalmic drops was this morning. Her last dose of the Alphagan was a week ago because she ran out.  States she tried to get in to see her ophthalmologist, Dr. Sherral Hammers, today, but they closed at noon. Her last follow up with him was about 4 months ago. She was able to make an appointment for 10 AM on Monday, July 16.    Past Medical History:  Diagnosis Date  . Allergy   . Anxiety   . Arthritis    s/p R TKR  . Chest pain on exertion   . Cyst of left kidney 2015   by lumbar MRI pending renal US  . DDD (degenerative disc disease), lumbar 03/2014   severe L2-3 with L HNP with L2/3 nerve root impingement [name])  . Depression   . Depression with anxiety   . Diabetes type 2, controlled ([place]) 2011   borderline  . Dyspnea   . Glaucoma   . History of ulcer disease   . HLD (hyperlipidemia)    no meds taken  . Hypertension   . PONV (postoperative nausea and vomiting)   . Ulcer     Patient Active Problem List   Diagnosis Date Noted  . Rotator cuff tear 02/02/2017  .  Acquired renal cyst of left kidney 01/01/2016  . Left ankle pain 05/04/2014  . Left ankle injury 05/04/2014  . DDD (degenerative disc disease), lumbar 03/04/2014  . Chest pain on exertion   . Dyspnea   . Chest pain 10/06/2013  . Plantar fasciitis of left foot 10/01/2013  . Other hammer toe (acquired) 08/20/2013  . IBS (irritable bowel syndrome) 05/13/2013  . Urinary frequency 05/13/2013  . Hammer toe, acquired 02/11/2013  . Metatarsalgia of both feet 02/11/2013  . Breast pain, left 11/13/2012  . Obesity 11/13/2012  . Glaucoma   . Type 2 diabetes mellitus, controlled ([place])   . Anxiety and depression   . HTN (hypertension) 01/06/2012  . GERD (gastroesophageal reflux disease) 01/06/2012  . Hypercholesteremia 01/06/2012    Past Surgical History:  Procedure Laterality Date  . ABDOMINAL HYSTERECTOMY  2007   fibroids, heavy bleeding  . CATARACT EXTRACTION W/ INTRAOCULAR LENS IMPLANT Bilateral   . COLONOSCOPY    . GLAUCOMA SURGERY    . HAMMER TOE SURGERY Bilateral   . TONSILLECTOMY    . TOTAL KNEE ARTHROPLASTY Right 2004   TKR (Dr. Eddie Dibbles with [facility])  . TOTAL KNEE REVISION Right 03/2014   Dr Al Corpus [facility]    OB History    Drema [place]  Term Preterm AB Living   0         0   SAB TAB Ectopic Multiple Live Births                   Home Medications    Prior to Admission medications   Medication Sig Start Date End Date Taking? Authorizing Provider  ALPHAGAN P 0.1 % SOLN 1 drop each eye three times daily 08/07/16   [provider]  ALPRAZolam (XANAX) 0.25 MG tablet Take 1 tablet (0.25 mg total) by mouth at bedtime as needed. for sleep 02/02/17   Golden Circle, FNP  aspirin EC 81 MG tablet Take 81 mg by mouth daily.    [provider]  brimonidine (ALPHAGAN P) 0.1 % SOLN Place 1 drop into both eyes 3 (three) times daily. 03/16/17   Nihaal Friesen C, PA-C  cyclobenzaprine (FLEXERIL) 10 MG tablet Take 1 tablet (10 mg total) by mouth 2 (two) times daily as needed for  muscle spasms. 01/05/17   Lysbeth Penner, FNP  dorzolamide-timolol (COSOPT) 22.3-6.8 MG/ML ophthalmic solution 1 drop. Each eye every 12 hours 08/07/16   [provider]  erythromycin ophthalmic ointment Place a 1/2 inch ribbon of ointment into the lower eyelid. 03/16/17   Trenita Hulme C, PA-C  glipiZIDE (GLIPIZIDE XL) 5 MG 24 hr tablet Take 1-2 tablets (5-10 mg total) by mouth daily with breakfast. 03/01/17   Golden Circle, FNP  IBUPROFEN PO Take by mouth as needed.    [provider]  lisinopril-hydrochlorothiazide (ZESTORETIC) 20-12.5 MG tablet Take 1 tablet by mouth every morning. In AM 02/02/17   Golden Circle, FNP    Family History Family History  Problem Relation Age of Onset  . Cancer Maternal Grandmother 63       ovarian  . Diabetes Maternal Grandmother   . Hypertension Maternal Grandmother   . Cancer Mother 36       bone, MM  . Stroke Other        unsure who  . CAD Neg Hx   . Colon cancer Neg Hx   . Esophageal cancer Neg Hx   . Rectal cancer Neg Hx   . Stomach cancer Neg Hx     Social History Social History  Substance Use Topics  . Smoking status: Never Smoker  . Smokeless tobacco: Never Used  . Alcohol use No     Allergies   Codeine; Metformin and related; and Oxycodone   Review of Systems Review of Systems  Constitutional: Negative for chills and fever.  HENT: Negative for facial swelling.   Eyes: Positive for pain, discharge and redness. Negative for visual disturbance.  Respiratory: Negative for shortness of breath.   Cardiovascular: Negative for chest pain.  Gastrointestinal: Negative for nausea and vomiting.  Musculoskeletal: Negative for neck pain.  Neurological: Negative for dizziness, syncope, weakness, light-headedness, numbness and headaches.  All other systems reviewed and are negative.    Physical Exam Updated Vital Signs BP 120/89 (BP Location: Left Arm)   Pulse 67   Temp 98.6 F (37 C) (Oral)   Resp 18   Ht 5\' 6"   (1.676 m)   Wt 97.5 kg (215 lb)   SpO2 99%   BMI 34.70 kg/m   Physical Exam  Constitutional: She appears well-developed and well-nourished. No distress.  HENT:  Head: Normocephalic and atraumatic.  Eyes: Pupils are equal, round, and reactive to light. Conjunctivae and EOM are normal.  No pain with EOMs, however,  patient states her "eyeballs feel tight."  Some minor bilateral scleral injection. No noted discharge.  No contact lenses in place.  Woods Lamp exam shows no increased uptake of fluorescein. Slit lamp unavailable.  Tono-Pen values: Right eye: 20  Left eye: 20    Visual Acuity  Right Eye Distance: 20/25 Left Eye Distance: 20/20 Bilateral Distance: 20/20  Right Eye Near: R Near: Left Eye Near:  L Near: Bilateral Near:      Neck: Normal range of motion. Neck supple.  Cardiovascular: Normal rate and regular rhythm.   Pulmonary/Chest: Effort normal. No respiratory distress.  Abdominal: Soft. There is no tenderness. There is no guarding.  Musculoskeletal: She exhibits no edema.  Lymphadenopathy:    She has no cervical adenopathy.  Neurological: She is alert.  Skin: Skin is warm and dry. She is not diaphoretic.  Psychiatric: She has a normal mood and affect. Her behavior is normal.  Nursing note and vitals reviewed.    ED Treatments / Results  Labs (all labs ordered are listed, but only abnormal results are displayed) Labs Reviewed - No data to display  EKG  EKG Interpretation None       Radiology No results found.  Procedures Procedures (including critical care time)  Medications Ordered in ED Medications  fluorescein ophthalmic strip 1 strip (1 strip Both Eyes Given 03/16/17 2124)  tetracaine (PONTOCAINE) 0.5 % ophthalmic solution 2 drop (2 drops Both Eyes Given 03/16/17 2124)     Initial Impression / Assessment and Plan / ED Course  I have reviewed the triage vital signs and the nursing notes.  Pertinent labs & imaging results that were available  during my care of the patient were reviewed by me and considered in my medical decision making (see chart for details).  Clinical Course as of Mar 16 2250  Fri Mar 16, 2017  2233 Spoke with Dr. Katy Fitch, ophthalmology, who agrees with office follow up. Will begin erythomycin and refill the Alphagan.  [SJ]    Clinical Course User Index [SJ] Eyal Greenhaw C, PA-C    Patient presents with mild eye pain and redness for several days. Noncompliant with Alphagan eye drops. Intraocular pressures higher than patient's target pressures, however, not to a level that needs emergent intervention. No visual acuity deficit. Ophthalmology follow-up on Monday. Return precautions discussed.   Findings and plan of care discussed with Carmin Muskrat, MD.   Final Clinical Impressions(s) / ED Diagnoses   Final diagnoses:  Redness and discharge of eye    New Prescriptions New Prescriptions   BRIMONIDINE (ALPHAGAN P) 0.1 % SOLN    Place 1 drop into both eyes 3 (three) times daily.   ERYTHROMYCIN OPHTHALMIC OINTMENT    Place a 1/2 inch ribbon of ointment into the lower eyelid.     Lorayne Bender, PA-C 03/16/17 2252    Carmin Muskrat, MD 03/17/17 [phone]

## 2017-03-16 NOTE — Discharge Instructions (Addendum)
Please begin using erythromycin ointment 4 times a day for the next 5 days.  Be sure to use your medication drops, as prescribed. Follow-up with your ophthalmologist next week, as planned.

## 2017-03-28 ENCOUNTER — Ambulatory Visit: Payer: Medicare Other

## 2017-03-30 ENCOUNTER — Encounter: Payer: Self-pay | Admitting: *Deleted

## 2017-04-04 HISTORY — PX: COLONOSCOPY: SHX174

## 2017-04-10 ENCOUNTER — Ambulatory Visit (AMBULATORY_SURGERY_CENTER): Payer: Self-pay

## 2017-04-10 VITALS — Ht 66.0 in | Wt 209.4 lb

## 2017-04-10 DIAGNOSIS — Z1211 Encounter for screening for malignant neoplasm of colon: Secondary | ICD-10-CM

## 2017-04-10 MED ORDER — NA SULFATE-K SULFATE-MG SULF 17.5-3.13-1.6 GM/177ML PO SOLN: 1.0000 | Freq: Once | ORAL | 0 refills | Status: AC

## 2017-04-10 NOTE — Progress Notes (Signed)
Denies allergies to eggs or soy products. Denies complication of anesthesia or sedation. Denies use of weight loss medication. Denies use of O2.   Emmi instructions declined.  

## 2017-04-11 ENCOUNTER — Encounter: Payer: Self-pay | Admitting: Gastroenterology

## 2017-04-16 NOTE — Progress Notes (Signed)
Cardiology Office Note   Date:  04/17/2017   ID:  Melissa [name], DOB 08-11-1951, MRN [id number]  PCP:  Golden Circle, FNP  Cardiologist:   Vida Nicol Martinique, MD   Chief Complaint  Patient presents with  . Hypertension      History of Present Illness: Melissa [name] is a 66 y.o. female who presents for surgical clearance for hip and shoulder surgery at the request of Mauricio Po FNP. She has a history of HTN, DM type 2, HLD. Last seen by me in February 2015 for evaluation of HTN. She had a normal Myoview study in March 2015. Echo at that time showed moderate LA enlargement, otherwise normal.   On follow up today she is being scheduled for surgery on right hip and left shoulder. Planning to have hip done at [facility] and shoulder done here. Denies any symptoms of chest pain, SOB, palpitations. Tries to stay active but limited by hip pain. Was on a statin in the past but states it just made her feel bad.    Past Medical History:  Diagnosis Date  . Allergy   . Anxiety   . Arthritis    s/p R TKR  . Chest pain on exertion   . Cyst of left kidney 2015   by lumbar MRI pending renal US  . DDD (degenerative disc disease), lumbar 03/2014   severe L2-3 with L HNP with L2/3 nerve root impingement [name])  . Depression   . Depression with anxiety   . Diabetes type 2, controlled ([place]) 2011   borderline  . Dyspnea   . Glaucoma   . History of ulcer disease   . HLD (hyperlipidemia)    no meds taken  . Hypertension   . PONV (postoperative nausea and vomiting)   . Ulcer     Past Surgical History:  Procedure Laterality Date  . ABDOMINAL HYSTERECTOMY  2007   fibroids, heavy bleeding  . CATARACT EXTRACTION W/ INTRAOCULAR LENS IMPLANT Bilateral   . COLONOSCOPY    . GLAUCOMA SURGERY    . HAMMER TOE SURGERY Bilateral   . TONSILLECTOMY    . TOTAL KNEE ARTHROPLASTY Right 2004   TKR (Dr. Eddie Dibbles with [facility])  . TOTAL KNEE REVISION Right 03/2014   Dr Al Corpus [facility]      Current Outpatient Prescriptions  Medication Sig Dispense Refill  . ALPRAZolam (XANAX) 0.25 MG tablet Take 1 tablet (0.25 mg total) by mouth at bedtime as needed. for sleep 30 tablet 1  . aspirin EC 81 MG tablet Take 81 mg by mouth daily.    [name] glipiZIDE (GLIPIZIDE XL) 5 MG 24 hr tablet Take 1-2 tablets (5-10 mg total) by mouth daily with breakfast. 60 tablet 3  . IBUPROFEN PO Take by mouth as needed.    [name] lisinopril-hydrochlorothiazide (ZESTORETIC) 20-12.5 MG tablet Take 1 tablet by mouth every morning. In AM 30 tablet 5   No current facility-administered medications for this visit.     Allergies:   Codeine; Metformin and related; and Oxycodone    Social History:  The patient  reports that she has never smoked. She has never used smokeless tobacco. She reports that she does not drink alcohol or use drugs.   Family History:  The patient's family history includes Cancer (age of onset: 68) in her mother; Cancer (age of onset: 59) in her maternal grandmother; Diabetes in her maternal grandmother; Hypertension in her maternal grandmother; Stroke in her other.  ROS:  Please see the history of present illness.   Otherwise, review of systems are positive for none.   All other systems are reviewed and negative.    PHYSICAL EXAM: VS:  BP 138/90   Pulse 64   Ht 5\' 6"  (1.676 m)   Wt 213 lb (96.6 kg)   BMI 34.38 kg/m  , BMI Body mass index is 34.38 kg/m. GEN: Well nourished, well developed, in no acute distress  HEENT: normal  Neck: no JVD, carotid bruits, or masses Cardiac: RRR; no murmurs, rubs, or gallops,no edema  Respiratory:  clear to auscultation bilaterally, normal work of breathing GI: soft, nontender, nondistended, + BS MS: no deformity or atrophy  Skin: warm and dry, no rash Neuro:  Strength and sensation are intact Psych: euthymic mood, full affect   EKG:  EKG is ordered today. The ekg ordered today demonstrates NSR with normal Ecg. I have personally reviewed and  interpreted this study.    Recent Labs: 03/08/2017: ALT 12; BUN 13; Creatinine, Ser 0.66; Potassium 3.5; Sodium 140    Lipid Panel    Component Value Date/Time   CHOL 227 (H) 04/21/2014 1012   TRIG 177.0 (H) 04/21/2014 1012   HDL 50.50 04/21/2014 1012   CHOLHDL 4 04/21/2014 1012   VLDL 35.4 04/21/2014 1012   LDLCALC 141 (H) 04/21/2014 1012   LDLDIRECT 192.0 02/09/2015 1016    Labs dated 04/25/16: A1c 6.3%. Cholesterol 212, triglycerides 133, HDL 60, LDL 125.  Dated 03/08/17: A1c 6.5%. BMET and ALT normal.  Wt Readings from Last 3 Encounters:  04/17/17 213 lb (96.6 kg)  04/10/17 209 lb 6.4 oz (95 kg)  03/16/17 215 lb (97.5 kg)      Other studies Reviewed: Myoview 11/03/13: Normal  Echo: 11/03/13: Study Conclusions  - Left ventricle: The cavity size was normal. Systolic function was normal. Wall motion was normal; there were no regional wall motion abnormalities. - Left atrium: The atrium was moderately dilated.   ASSESSMENT AND PLAN:  1.  HTN well controlled 2. DM type 2. On glipizide. Per primary care 3. HLD. LDL 125. I would recommend statin therapy. Given ? Of side effects would start with low dose. Primary care will manage. 4. Pre op ortho surgery. She is cleared from a cardiac standpoint. She is asymptomatic. Had extensive cardiac evaluation in 2015 that was unremarkable.    I will follow up prn.  Signed, Dreonna Hussein Martinique, MD  04/17/2017 10:46 AM    [facility] [address], [place], [place], [address] Phone [phone], Fax [phone]

## 2017-04-17 ENCOUNTER — Ambulatory Visit (INDEPENDENT_AMBULATORY_CARE_PROVIDER_SITE_OTHER): Payer: Medicare Other | Admitting: Cardiology

## 2017-04-17 ENCOUNTER — Encounter: Payer: Self-pay | Admitting: Cardiology

## 2017-04-17 VITALS — BP 138/90 | HR 64 | Ht 66.0 in | Wt 213.0 lb

## 2017-04-17 DIAGNOSIS — I10 Essential (primary) hypertension: Secondary | ICD-10-CM

## 2017-04-17 DIAGNOSIS — E7800 Pure hypercholesterolemia, unspecified: Secondary | ICD-10-CM

## 2017-04-17 NOTE — Patient Instructions (Signed)
You are clear for orthopedic surgery  Continue your medication.

## 2017-04-17 NOTE — Addendum Note (Signed)
Addended by: Kathyrn Lass on: 04/17/2017 10:56 AM   Modules accepted: Orders

## 2017-04-24 ENCOUNTER — Ambulatory Visit (AMBULATORY_SURGERY_CENTER): Payer: Medicare Other | Admitting: Gastroenterology

## 2017-04-24 ENCOUNTER — Encounter: Payer: Self-pay | Admitting: Gastroenterology

## 2017-04-24 VITALS — BP 122/76 | HR 69 | Temp 97.8°F | Resp 19 | Ht 66.0 in | Wt 209.0 lb

## 2017-04-24 DIAGNOSIS — D122 Benign neoplasm of ascending colon: Secondary | ICD-10-CM | POA: Diagnosis not present

## 2017-04-24 DIAGNOSIS — Z1212 Encounter for screening for malignant neoplasm of rectum: Secondary | ICD-10-CM | POA: Diagnosis not present

## 2017-04-24 DIAGNOSIS — Z1211 Encounter for screening for malignant neoplasm of colon: Secondary | ICD-10-CM | POA: Diagnosis present

## 2017-04-24 MED ORDER — SODIUM CHLORIDE 0.9 % IV SOLN: 500.0000 mL | INTRAVENOUS | Status: DC

## 2017-04-24 NOTE — Patient Instructions (Signed)
YOU HAD AN ENDOSCOPIC PROCEDURE TODAY AT [facility]:   Refer to the procedure report that was given to you for any specific questions about what was found during the examination.  If the procedure report does not answer your questions, please call your gastroenterologist to clarify.  If you requested that your care partner not be given the details of your procedure findings, then the procedure report has been included in a sealed envelope for you to review at your convenience later.  YOU SHOULD EXPECT: Some feelings of bloating in the abdomen. Passage of more gas than usual.  Walking can help get rid of the air that was put into your GI tract during the procedure and reduce the bloating. If you had a lower endoscopy (such as a colonoscopy or flexible sigmoidoscopy) you may notice spotting of blood in your stool or on the toilet paper. If you underwent a bowel prep for your procedure, you may not have a normal bowel movement for a few days.  Please Note:  You might notice some irritation and congestion in your nose or some drainage.  This is from the oxygen used during your procedure.  There is no need for concern and it should clear up in a day or so.  SYMPTOMS TO REPORT IMMEDIATELY:   Following lower endoscopy (colonoscopy or flexible sigmoidoscopy):  Excessive amounts of blood in the stool  Significant tenderness or worsening of abdominal pains  Swelling of the abdomen that is new, acute  Fever of 100F or higher    For urgent or emergent issues, a gastroenterologist can be reached at any hour by calling [phone].   DIET:  We do recommend a small meal at first, but then you may proceed to your regular diet.  Drink plenty of fluids but you should avoid alcoholic beverages for 24 hours.  ACTIVITY:  You should plan to take it easy for the rest of today and you should NOT DRIVE or use heavy machinery until tomorrow (because of the sedation medicines used during the test).     FOLLOW UP: Our staff will call the number listed on your records the next business day following your procedure to check on you and address any questions or concerns that you may have regarding the information given to you following your procedure. If we do not reach you, we will leave a message.  However, if you are feeling well and you are not experiencing any problems, there is no need to return our call.  We will assume that you have returned to your regular daily activities without incident.  If any biopsies were taken you will be contacted by phone or by letter within the next 1-3 weeks.  Please call us at [phone] if you have not heard about the biopsies in 3 weeks.    SIGNATURES/CONFIDENTIALITY: You and/or your care partner have signed paperwork which will be entered into your electronic medical record.  These signatures attest to the fact that that the information above on your After Visit Summary has been reviewed and is understood.  Full responsibility of the confidentiality of this discharge information lies with you and/or your care-partner.    Handouts were given to your care partner on polyps, diverticulosis, and a high fiber diet with liberal fluid intake. Your blood sugar was 105 in the recovery room. You may resume your current medications today. Await biopsy results. Please call if any questions or concerns.

## 2017-04-24 NOTE — Progress Notes (Signed)
No problems noted in the recovery room. maw 

## 2017-04-24 NOTE — Progress Notes (Signed)
Report to PACU, RN, vss, BBS= Clear.  

## 2017-04-24 NOTE — Op Note (Signed)
Ord Patient Name: Payten Hobin Procedure Date: 04/24/2017 9:49 AM MRN: [id number] Endoscopist: Ladene Artist , MD Age: 66 Referring MD:  Date of Birth: 1951/06/26 Gender: Female Account #: [id number] Procedure:                Colonoscopy Indications:              Screening for colorectal malignant neoplasm Medicines:                Monitored Anesthesia Care Procedure:                Pre-Anesthesia Assessment:                           - Prior to the procedure, a History and Physical                            was performed, and patient medications and                            allergies were reviewed. The patient's tolerance of                            previous anesthesia was also reviewed. The risks                            and benefits of the procedure and the sedation                            options and risks were discussed with the patient.                            All questions were answered, and informed consent                            was obtained. Prior Anticoagulants: The patient has                            taken no previous anticoagulant or antiplatelet                            agents. ASA Grade Assessment: II - A patient with                            mild systemic disease. After reviewing the risks                            and benefits, the patient was deemed in                            satisfactory condition to undergo the procedure.                           After obtaining informed consent, the colonoscope  was passed under direct vision. Throughout the                            procedure, the patient's blood pressure, pulse, and                            oxygen saturations were monitored continuously. The                            Colonoscope was introduced through the anus and                            advanced to the the cecum, identified by                            appendiceal orifice and  ileocecal valve. The                            ileocecal valve, appendiceal orifice, and rectum                            were photographed. The quality of the bowel                            preparation was excellent. The colonoscopy was                            performed without difficulty. The patient tolerated                            the procedure well. Scope In: 10:11:12 AM Scope Out: 10:22:46 AM Scope Withdrawal Time: 0 hours 8 minutes 23 seconds  Total Procedure Duration: 0 hours 11 minutes 34 seconds  Findings:                 The perianal and digital rectal examinations were                            normal.                           A 6 mm polyp was found in the ascending colon. The                            polyp was semi-pedunculated. The polyp was removed                            with a cold snare. Resection and retrieval were                            complete.                           A few small-mouthed diverticula were found in the  left colon. There was no evidence of diverticular                            bleeding.                           The exam was otherwise without abnormality on                            direct and retroflexion views. Complications:            No immediate complications. Estimated blood loss:                            None. Estimated Blood Loss:     Estimated blood loss: none. Impression:               - One 6 mm polyp in the ascending colon, removed                            with a cold snare. Resected and retrieved.                           - Mild diverticulosis in the left colon. There was                            no evidence of diverticular bleeding.                           - The examination was otherwise normal on direct                            and retroflexion views. Recommendation:           - Repeat colonoscopy in 5 years for surveillance if                            polyp is  precancerous, otherwise 10 years.                           - Patient has a contact number available for                            emergencies. The signs and symptoms of potential                            delayed complications were discussed with the                            patient. Return to normal activities tomorrow.                            Written discharge instructions were provided to the                            patient.                           -  Resume previous diet.                           - Continue present medications.                           - Await pathology results. Ladene Artist, MD 04/24/2017 10:29:00 AM This report has been signed electronically.

## 2017-04-24 NOTE — Progress Notes (Signed)
Called to room to assist during endoscopic procedure.  Patient ID and intended procedure confirmed with present staff. Received instructions for my participation in the procedure from the performing physician.  

## 2017-04-25 ENCOUNTER — Telehealth: Payer: Self-pay

## 2017-04-25 NOTE — Telephone Encounter (Signed)
Left message on answering machine. 

## 2017-04-25 NOTE — Telephone Encounter (Signed)
  Follow up Call-  Call back number 04/24/2017  Post procedure Call Back phone  # [phone]  Permission to leave phone message Yes  Some recent data might be hidden     Patient questions:  Do you have a fever, pain , or abdominal swelling? No. Pain Score  0 *  Have you tolerated food without any problems? Yes.    Have you been able to return to your normal activities? Yes.    Do you have any questions about your discharge instructions: Diet   No. Medications  No. Follow up visit  No.  Do you have questions or concerns about your Care? No.  Actions: * If pain score is 4 or above: No action needed, pain <4.

## 2017-05-03 ENCOUNTER — Encounter: Payer: Self-pay | Admitting: Family Medicine

## 2017-05-03 ENCOUNTER — Encounter: Payer: Self-pay | Admitting: Gastroenterology

## 2017-05-16 ENCOUNTER — Encounter: Payer: Medicare Other | Admitting: Gastroenterology

## 2017-05-25 ENCOUNTER — Other Ambulatory Visit: Payer: Self-pay | Admitting: Family

## 2017-05-30 ENCOUNTER — Ambulatory Visit (INDEPENDENT_AMBULATORY_CARE_PROVIDER_SITE_OTHER): Payer: Medicare Other | Admitting: Family

## 2017-05-30 ENCOUNTER — Encounter: Payer: Self-pay | Admitting: Family

## 2017-05-30 VITALS — BP 110/74 | HR 81 | Temp 98.2°F | Resp 16 | Ht 66.0 in | Wt 208.0 lb

## 2017-05-30 DIAGNOSIS — F419 Anxiety disorder, unspecified: Secondary | ICD-10-CM | POA: Diagnosis not present

## 2017-05-30 DIAGNOSIS — I10 Essential (primary) hypertension: Secondary | ICD-10-CM

## 2017-05-30 DIAGNOSIS — E119 Type 2 diabetes mellitus without complications: Secondary | ICD-10-CM | POA: Diagnosis not present

## 2017-05-30 DIAGNOSIS — Z23 Encounter for immunization: Secondary | ICD-10-CM

## 2017-05-30 DIAGNOSIS — F329 Major depressive disorder, single episode, unspecified: Secondary | ICD-10-CM | POA: Diagnosis not present

## 2017-05-30 DIAGNOSIS — F32A Depression, unspecified: Secondary | ICD-10-CM

## 2017-05-30 MED ORDER — ALPRAZOLAM 0.25 MG PO TABS: 0.2500 mg | ORAL_TABLET | Freq: Every evening | ORAL | 1 refills | Status: DC | PRN

## 2017-05-30 MED ORDER — LISINOPRIL-HYDROCHLOROTHIAZIDE 20-12.5 MG PO TABS: 1.0000 | ORAL_TABLET | Freq: Every morning | ORAL | 5 refills | Status: DC

## 2017-05-30 NOTE — Assessment & Plan Note (Signed)
Anxiety/depression/insomnia periodically maintained with current medication regimen and no adverse side effects. [place] controlled substance database reviewed with no irregularities. Continue current dosage of alprazolam as needed for anxiety and sleep.

## 2017-05-30 NOTE — Assessment & Plan Note (Signed)
Blood pressure well-controlled and below goal 140/90 with current medication regimen and no adverse side effects. Denies worse headache of life with no new symptoms of end organ damage noted on physical exam. Continue current dosage of lisinopril-hydrochlorothiazide. Encouraged to monitor blood pressure at home and follow low-sodium diet.

## 2017-05-30 NOTE — Progress Notes (Signed)
Subjective:    Patient ID: Melissa [name], female    DOB: 11-20-1950, 66 y.o.   MRN: [id number]  Chief Complaint  Patient presents with  . Follow-up    wants A1c done and kidney and liver function checked     HPI:  Melissa [name] is a 66 y.o. female who  has a past medical history of Allergy; Anxiety; Arthritis; Chest pain on exertion; Cyst of left kidney (2015); DDD (degenerative disc disease), lumbar (03/2014); Depression; Depression with anxiety; Diabetes type 2, controlled ([place]) (2011); Dyspnea; Glaucoma; History of ulcer disease; HLD (hyperlipidemia); Hypertension; PONV (postoperative nausea and vomiting); and Ulcer. and presents today for an acute office visit.    1.) Blood pressure - Currently maintained on lisinopril-hydrochlorothiazide.  Reports taking the medications as prescribed and denies adverse side effects or hypotensive readings. Blood pressures at home have been well controlled. Denies changes in vision, worst headache of life or new symptoms of end organ damage. Working on following a low sodium diet.    BP Readings from Last 3 Encounters:  05/30/17 110/74  04/24/17 122/76  04/17/17 138/90    2.) Type 2 diabetes - Currently prescribed glipizide. Reports taking the medication as prescribed and denies adverse side effects or hypoglycemic readings. Blood sugars at home have been well controlled but did have an exacerbation of hyperglycemia related to prednisone. Denies new symptoms of end organ damage. No excessive hunger, thirst, or urination. Working on a low/carbohydrate modified oral intake.   Lab Results  Component Value Date   HGBA1C 6.4 03/08/2017     Lab Results  Component Value Date   CREATININE 0.66 03/08/2017   BUN 13 03/08/2017   NA 140 03/08/2017   K 3.5 03/08/2017   CL 103 03/08/2017   CO2 28 03/08/2017     3.) Anxiety / Sleep - Currently maintained on alprazolam. Reports taking the medication as prescribed and denies adverse side  effects. Anxiety is well-controlled and sleeping well with the medication as needed.    Allergies  Allergen Reactions  . Codeine Other (See Comments)    palpitations  . Metformin And Related Nausea Only  . Oxycodone Nausea And Vomiting    Outpatient Medications Prior to Visit  Medication Sig Dispense Refill  . aspirin EC 81 MG tablet Take 81 mg by mouth daily.    [name] glipiZIDE (GLUCOTROL XL) 5 MG 24 hr tablet TAKE 1 TO 2 TABLETS(5 TO 10 MG) BY MOUTH DAILY WITH BREAKFAST 180 tablet 0  . IBUPROFEN PO Take by mouth as needed.    . ALPRAZolam (XANAX) 0.25 MG tablet Take 1 tablet (0.25 mg total) by mouth at bedtime as needed. for sleep 30 tablet 1  . lisinopril-hydrochlorothiazide (ZESTORETIC) 20-12.5 MG tablet Take 1 tablet by mouth every morning. In AM 30 tablet 5   Facility-Administered Medications Prior to Visit  Medication Dose Route Frequency Provider Last Rate Last Dose  . 0.9 %  sodium chloride infusion  500 mL Intravenous Continuous Ladene Artist, MD          Past Surgical History:  Procedure Laterality Date  . ABDOMINAL HYSTERECTOMY  2007   fibroids, heavy bleeding  . CATARACT EXTRACTION W/ INTRAOCULAR LENS IMPLANT Bilateral   . COLONOSCOPY  04/2017   TA, diverticulosis, rpt ? ([facility])  . GLAUCOMA SURGERY    . HAMMER TOE SURGERY Bilateral   . TONSILLECTOMY    . TOTAL KNEE ARTHROPLASTY Right 2004   TKR (Dr. Eddie Dibbles with [facility])  .  TOTAL KNEE REVISION Right 03/2014   Dr Talitha Givens      Past Medical History:  Diagnosis Date  . Allergy   . Anxiety   . Arthritis    s/p R TKR  . Chest pain on exertion   . Cyst of left kidney 2015   by lumbar MRI pending renal US  . DDD (degenerative disc disease), lumbar 03/2014   severe L2-3 with L HNP with L2/3 nerve root impingement [name])  . Depression   . Depression with anxiety   . Diabetes type 2, controlled ([place]) 2011   borderline  . Dyspnea   . Glaucoma   . History of ulcer disease   . HLD (hyperlipidemia)      no meds taken  . Hypertension   . PONV (postoperative nausea and vomiting)   . Ulcer       Review of Systems  Constitutional: Negative for chills, fatigue and fever.  Eyes:       Negative for changes in vision  Respiratory: Negative for cough, chest tightness, shortness of breath and wheezing.   Cardiovascular: Negative for chest pain, palpitations and leg swelling.  Endocrine: Negative for polydipsia, polyphagia and polyuria.  Neurological: Negative for dizziness, weakness, light-headedness and numbness.  Psychiatric/Behavioral: Negative for agitation, behavioral problems, decreased concentration, dysphoric mood, hallucinations, sleep disturbance and suicidal ideas. The patient is not nervous/anxious.       Objective:    BP 110/74 (BP Location: Left Arm, Patient Position: Sitting, Cuff Size: Large)   Pulse 81   Temp 98.2 F (36.8 C) (Oral)   Resp 16   Ht 5\' 6"  (1.676 m)   Wt 208 lb (94.3 kg)   SpO2 97%   BMI 33.57 kg/m  Nursing note and vital signs reviewed.  Physical Exam  Constitutional: She is oriented to person, place, and time. She appears well-developed and well-nourished. No distress.  Cardiovascular: Normal rate, regular rhythm, normal heart sounds and intact distal pulses.   Pulmonary/Chest: Effort normal and breath sounds normal.  Neurological: She is alert and oriented to person, place, and time.  Diabetic foot exam - bilateral feet are free from skin breakdown, cuts, and abrasions. Toenails are neatly trimmed. Pulses are intact and appropriate. Sensation is intact to monofilament bilaterally.  Skin: Skin is warm and dry.  Psychiatric: She has a normal mood and affect. Her behavior is normal. Judgment and thought content normal.       Assessment & Plan:   Problem List Items Addressed This Visit      Cardiovascular and Mediastinum   HTN (hypertension) (Chronic)    Blood pressure well-controlled and below goal 140/90 with current medication regimen and  no adverse side effects. Denies worse headache of life with no new symptoms of end organ damage noted on physical exam. Continue current dosage of lisinopril-hydrochlorothiazide. Encouraged to monitor blood pressure at home and follow low-sodium diet.      Relevant Medications   lisinopril-hydrochlorothiazide (ZESTORETIC) 20-12.5 MG tablet   Other Relevant Orders   CBC   Comprehensive metabolic panel     Endocrine   Type 2 diabetes mellitus, controlled (HCC) - Primary    Obtain hemoglobin A1c. Stable with current dosage of glipizide. Maintained on lisinopril for CAD risk reduction. Diabetic foot exam completed today. Urine microalbumin is up-to-date. Encouraged to complete diabetic eye exam and independently. Influenza updated today. Continue current dosage of glipizide pending A1c results.      Relevant Medications   lisinopril-hydrochlorothiazide (ZESTORETIC) 20-12.5 MG  tablet   Other Relevant Orders   CBC   Comprehensive metabolic panel   Hemoglobin A1c     Other   Anxiety and depression    Anxiety/depression/insomnia periodically maintained with current medication regimen and no adverse side effects. [place] controlled substance database reviewed with no irregularities. Continue current dosage of alprazolam as needed for anxiety and sleep.       Other Visit Diagnoses    Need for influenza vaccination       Relevant Orders   Flu vaccine HIGH DOSE PF (Fluzone High dose) (Completed)       I have changed Melissa [name]'s lisinopril-hydrochlorothiazide. I am also having her maintain her aspirin EC, IBUPROFEN PO, glipiZIDE, and ALPRAZolam. We will continue to administer sodium chloride.   Meds ordered this encounter  Medications  . ALPRAZolam (XANAX) 0.25 MG tablet    Sig: Take 1 tablet (0.25 mg total) by mouth at bedtime as needed. for sleep    Dispense:  30 tablet    Refill:  1  . lisinopril-hydrochlorothiazide (ZESTORETIC) 20-12.5 MG tablet    Sig: Take 1 tablet by  mouth every morning.    Dispense:  30 tablet    Refill:  5    Order Specific Question:   Supervising Provider    Answer:   Pricilla Holm A [[id number]]     Follow-up: Return in about 4 months (around 09/29/2017), or if symptoms worsen or fail to improve.  Mauricio Po, FNP

## 2017-05-30 NOTE — Assessment & Plan Note (Signed)
Obtain hemoglobin A1c. Stable with current dosage of glipizide. Maintained on lisinopril for CAD risk reduction. Diabetic foot exam completed today. Urine microalbumin is up-to-date. Encouraged to complete diabetic eye exam and independently. Influenza updated today. Continue current dosage of glipizide pending A1c results.

## 2017-05-30 NOTE — Patient Instructions (Addendum)
Thank you for choosing [facility].  SUMMARY AND INSTRUCTIONS:  Please continue to take your medications as prescribed.  We will check your A1c, kidney function, liver function, and electrolytes.   Continue to check your feet daily.   Follow up in about 3-4 months.  Medication:  Your prescription(s) have been submitted to your pharmacy or been printed and provided for you. Please take as directed and contact our office if you believe you are having problem(s) with the medication(s) or have any questions.  Labs:  Please stop by the lab on the lower level of the building for your blood work. Your results will be released to [facility] (or called to you) after review, usually within 72 hours after test completion. If any changes need to be made, you will be notified at that same time.  1.) The lab is open from 7:30am to 5:30 pm Monday-Friday 2.) No appointment is necessary 3.) Fasting (if needed) is 6-8 hours after food and drink; black coffee and water are okay   Follow up:  If your symptoms worsen or fail to improve, please contact our office for further instruction, or in case of emergency go directly to the emergency room at the closest medical facility.

## 2017-07-16 ENCOUNTER — Telehealth: Payer: Self-pay | Admitting: Nurse Practitioner

## 2017-07-16 MED ORDER — LISINOPRIL-HYDROCHLOROTHIAZIDE 20-12.5 MG PO TABS: 1.0000 | ORAL_TABLET | Freq: Every morning | ORAL | 1 refills | Status: DC

## 2017-07-16 MED ORDER — GLIPIZIDE ER 5 MG PO TB24: ORAL_TABLET | ORAL | 1 refills | Status: DC

## 2017-07-16 NOTE — Telephone Encounter (Signed)
lisinopril-hydrochlorothiazide (ZESTORETIC) 20-12.5 MG tablet   glipiZIDE (GLUCOTROL XL) 5 MG 24 hr tablet   Patient is requesting a refill. She has set up a transfer care appointment for 09/14/17. Please advise. Thank you.

## 2017-07-17 NOTE — Telephone Encounter (Signed)
Meds have been sent.

## 2017-07-18 ENCOUNTER — Other Ambulatory Visit: Payer: Self-pay | Admitting: *Deleted

## 2017-07-18 MED ORDER — GLIPIZIDE ER 5 MG PO TB24: ORAL_TABLET | ORAL | 0 refills | Status: DC

## 2017-07-19 DIAGNOSIS — Z96643 Presence of artificial hip joint, bilateral: Secondary | ICD-10-CM | POA: Insufficient documentation

## 2017-07-19 DIAGNOSIS — Z96641 Presence of right artificial hip joint: Secondary | ICD-10-CM | POA: Insufficient documentation

## 2017-08-23 ENCOUNTER — Telehealth: Payer: Self-pay

## 2017-08-23 NOTE — Telephone Encounter (Signed)
Spoke with patient about the message below and advised that I have not seen her surgical clearance form yet. I told her I would keep an eye out for the form but in the mean time she can call [facility] and get them to fax it over to [place] as well. I also advised pt that she needs to be seen for a surgical clearance before we fill out from because [facility] has never seen her. She has an establish care appointment in January and would like to make that a surgical clearance appointment as well.    Copied from [place]. Topic: Referral - Status >> Aug 21, 2017  3:30 PM Vernona Rieger wrote: Reason for CRM:  Pt is wanting to know if the form was sent to [place] for her to have surgery. She said that she mailed it on last Thursday. Please call back to let her know you received the form. [phone] It is for shoulder surgery. Just needing clearance from the PCP. Needs to be filled out and faxed over there.

## 2017-09-06 ENCOUNTER — Telehealth: Payer: Self-pay

## 2017-09-06 NOTE — Telephone Encounter (Signed)
Called pt and let her know that surgical clearance form was received.    Copied from [place] [phone]. Topic: Referral - Status >> Sep 06, 2017 10:38 AM Ahmed Prima L wrote: Pt wants to know if the office received a surgical clearance from the [facility]. Call back is [phone]

## 2017-09-14 ENCOUNTER — Ambulatory Visit (INDEPENDENT_AMBULATORY_CARE_PROVIDER_SITE_OTHER): Payer: Medicare Other | Admitting: Nurse Practitioner

## 2017-09-14 ENCOUNTER — Encounter: Payer: Self-pay | Admitting: Nurse Practitioner

## 2017-09-14 VITALS — BP 130/88 | HR 76 | Temp 98.4°F | Resp 16 | Ht 66.0 in | Wt 206.8 lb

## 2017-09-14 DIAGNOSIS — L989 Disorder of the skin and subcutaneous tissue, unspecified: Secondary | ICD-10-CM | POA: Diagnosis not present

## 2017-09-14 DIAGNOSIS — E119 Type 2 diabetes mellitus without complications: Secondary | ICD-10-CM | POA: Diagnosis not present

## 2017-09-14 DIAGNOSIS — Z419 Encounter for procedure for purposes other than remedying health state, unspecified: Secondary | ICD-10-CM | POA: Diagnosis not present

## 2017-09-14 DIAGNOSIS — E7800 Pure hypercholesterolemia, unspecified: Secondary | ICD-10-CM

## 2017-09-14 DIAGNOSIS — R0683 Snoring: Secondary | ICD-10-CM

## 2017-09-14 MED ORDER — LISINOPRIL-HYDROCHLOROTHIAZIDE 20-12.5 MG PO TABS: 1.0000 | ORAL_TABLET | Freq: Every morning | ORAL | 1 refills | Status: DC

## 2017-09-14 NOTE — Patient Instructions (Addendum)
Please return for lab work when you are fasting, no food for 8 hours prior to lab work. You may drink water or black coffee only.  I have placed a referral to dermatology, endocrinology, pulmonology for your skin, snoring and diabetes . Our office will call you to schedule these appointments. You should hear from our office in 7-10 days.  Id like to see you back in about 1-2 months, after you have your surgery and we can discuss your cholesterol and other routine healthcare needs.  It was nice to see you. Thanks for letting me take care of you today :)

## 2017-09-14 NOTE — Assessment & Plan Note (Signed)
Most recent A1c reflects good control on current dosage of glipizide, will continue. She requests endocrinology referral for diabetes management. Diagnostic testing ordered: - Comprehensive metabolic panel; Future - Hemoglobin A1c; Future Referrals ordered: - Ambulatory referral to Endocrinology

## 2017-09-14 NOTE — Assessment & Plan Note (Signed)
No recent lipid panel. She has been intolerant of several cholesterol medications in the past. Discussed obtaining an updated lipid panel and returning to clinic to follow up on cholesterol after her shoulder surgery is complete, I dont think it would be a good idea to start her on new medications that she can not tolerate during her shoulder surgery-she is agreeable. Diagnostic testing ordered: - Lipid panel; Future - CBC with Differential/Platelet; Future
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
## 2017-09-18 ENCOUNTER — Other Ambulatory Visit (INDEPENDENT_AMBULATORY_CARE_PROVIDER_SITE_OTHER): Payer: Medicare Other

## 2017-09-18 ENCOUNTER — Ambulatory Visit (INDEPENDENT_AMBULATORY_CARE_PROVIDER_SITE_OTHER): Payer: Medicare Other | Admitting: *Deleted

## 2017-09-18 DIAGNOSIS — E119 Type 2 diabetes mellitus without complications: Secondary | ICD-10-CM

## 2017-09-18 DIAGNOSIS — Z23 Encounter for immunization: Secondary | ICD-10-CM

## 2017-09-18 DIAGNOSIS — E7800 Pure hypercholesterolemia, unspecified: Secondary | ICD-10-CM | POA: Diagnosis not present

## 2017-09-18 DIAGNOSIS — Z419 Encounter for procedure for purposes other than remedying health state, unspecified: Secondary | ICD-10-CM | POA: Diagnosis not present

## 2017-09-18 LAB — HEMOGLOBIN A1C: Hgb A1c MFr Bld: 6.2 % (ref 4.6–6.5)

## 2017-09-18 LAB — CBC WITH DIFFERENTIAL/PLATELET
Basophils Absolute: 0 10*3/uL (ref 0.0–0.1)
Basophils Relative: 0.9 % (ref 0.0–3.0)
Eosinophils Absolute: 0.1 10*3/uL (ref 0.0–0.7)
Eosinophils Relative: 3.1 % (ref 0.0–5.0)
HCT: 39.1 % (ref 36.0–46.0)
Hemoglobin: 12.5 g/dL (ref 12.0–15.0)
Lymphocytes Relative: 42.5 % (ref 12.0–46.0)
Lymphs Abs: 1.9 10*3/uL (ref 0.7–4.0)
MCHC: 32 g/dL (ref 30.0–36.0)
MCV: 82.9 fl (ref 78.0–100.0)
Monocytes Absolute: 0.3 10*3/uL (ref 0.1–1.0)
Monocytes Relative: 5.8 % (ref 3.0–12.0)
Neutro Abs: 2.2 10*3/uL (ref 1.4–7.7)
Neutrophils Relative %: 47.7 % (ref 43.0–77.0)
Platelets: 350 10*3/uL (ref 150.0–400.0)
RBC: 4.71 Mil/uL (ref 3.87–5.11)
RDW: 16.1 % — ABNORMAL HIGH (ref 11.5–15.5)
WBC: 4.6 10*3/uL (ref 4.0–10.5)

## 2017-09-18 LAB — COMPREHENSIVE METABOLIC PANEL
ALT: 13 U/L (ref 0–35)
AST: 13 U/L (ref 0–37)
Albumin: 4.1 g/dL (ref 3.5–5.2)
Alkaline Phosphatase: 94 U/L (ref 39–117)
BUN: 15 mg/dL (ref 6–23)
CO2: 31 mEq/L (ref 19–32)
Calcium: 9.8 mg/dL (ref 8.4–10.5)
Chloride: 100 mEq/L (ref 96–112)
Creatinine, Ser: 0.69 mg/dL (ref 0.40–1.20)
GFR: 109.14 mL/min (ref >60.00)
Glucose, Bld: 107 mg/dL — ABNORMAL HIGH (ref 70–99)
Potassium: 3.7 mEq/L (ref 3.5–5.1)
Sodium: 140 mEq/L (ref 135–145)
Total Bilirubin: 0.3 mg/dL (ref 0.2–1.2)
Total Protein: 8.3 g/dL (ref 6.0–8.3)

## 2017-09-18 LAB — LIPID PANEL
Cholesterol: 264 mg/dL — ABNORMAL HIGH (ref 0–200)
HDL: 58 mg/dL (ref >39.00)
NonHDL: 206.29
Total CHOL/HDL Ratio: 5
Triglycerides: 211 mg/dL — ABNORMAL HIGH (ref 0.0–149.0)
VLDL: 42.2 mg/dL — ABNORMAL HIGH (ref 0.0–40.0)

## 2017-09-18 LAB — LDL CHOLESTEROL, DIRECT: Direct LDL: 183 mg/dL

## 2017-09-19 NOTE — Telephone Encounter (Signed)
Surgical clearance has been signed and faxed over to [facility] to approve surgery. Pt is aware.

## 2017-09-19 NOTE — Telephone Encounter (Signed)
Patient seen on 09/14/17, checking status to see if surgical clearance has been sent to [facility]. Please advise

## 2017-10-26 ENCOUNTER — Institutional Professional Consult (permissible substitution): Payer: Medicare Other | Admitting: Pulmonary Disease

## 2017-10-28 ENCOUNTER — Other Ambulatory Visit: Payer: Self-pay | Admitting: Family

## 2017-11-05 ENCOUNTER — Ambulatory Visit: Payer: Medicare Other | Admitting: Endocrinology

## 2017-11-05 ENCOUNTER — Other Ambulatory Visit: Payer: Self-pay | Admitting: Urgent Care

## 2017-11-05 ENCOUNTER — Other Ambulatory Visit: Payer: Self-pay

## 2017-11-05 ENCOUNTER — Ambulatory Visit (HOSPITAL_COMMUNITY)
Admission: EM | Admit: 2017-11-05 | Discharge: 2017-11-05 | Disposition: A | Discharge status: Home / Self Care | Payer: Medicare Other | Attending: Urgent Care | Admitting: Urgent Care

## 2017-11-05 ENCOUNTER — Encounter (HOSPITAL_COMMUNITY): Payer: Self-pay | Admitting: Emergency Medicine

## 2017-11-05 DIAGNOSIS — J3489 Other specified disorders of nose and nasal sinuses: Secondary | ICD-10-CM

## 2017-11-05 DIAGNOSIS — Z9109 Other allergy status, other than to drugs and biological substances: Secondary | ICD-10-CM

## 2017-11-05 DIAGNOSIS — R059 Cough, unspecified: Secondary | ICD-10-CM

## 2017-11-05 DIAGNOSIS — J0100 Acute maxillary sinusitis, unspecified: Secondary | ICD-10-CM | POA: Diagnosis not present

## 2017-11-05 DIAGNOSIS — R05 Cough: Secondary | ICD-10-CM

## 2017-11-05 DIAGNOSIS — H9203 Otalgia, bilateral: Secondary | ICD-10-CM

## 2017-11-05 DIAGNOSIS — J029 Acute pharyngitis, unspecified: Secondary | ICD-10-CM

## 2017-11-05 MED ORDER — PSEUDOEPHEDRINE HCL 60 MG PO TABS: 60.0000 mg | ORAL_TABLET | Freq: Three times a day (TID) | ORAL | 0 refills | Status: DC | PRN

## 2017-11-05 MED ORDER — BENZONATATE 100 MG PO CAPS: 100.0000 mg | ORAL_CAPSULE | Freq: Three times a day (TID) | ORAL | 0 refills | Status: DC | PRN

## 2017-11-05 MED ORDER — IPRATROPIUM BROMIDE 0.03 % NA SOLN: 2.0000 | Freq: Two times a day (BID) | NASAL | 0 refills | Status: DC

## 2017-11-05 MED ORDER — AMOXICILLIN 500 MG PO CAPS: 500.0000 mg | ORAL_CAPSULE | Freq: Three times a day (TID) | ORAL | 0 refills | Status: DC

## 2017-11-05 NOTE — ED Triage Notes (Signed)
Onset of symptoms 2 weeks ago.  Both ears are stuffy.  Sore throat and head congestion.  Runs a fever at night

## 2017-11-05 NOTE — ED Provider Notes (Signed)
  MRN: [id number] DOB: [birth year]  Subjective:   Melissa [name] is a 67 y.o. female presenting for 2 week history of persistent stuffy nose, sinus pain, bilateral ear pain, sore throat, productive cough. Has not tried medications for relief. She had same symptoms last year, resolved with azithromycin, Atrovent nasal spray. Denies fever, chest pain, shob, n/v, abdominal pain. Denies smoking cigarettes.  Allisen is allergic to codeine; metformin and related; and oxycodone.  Cyann  has a past medical history of Allergy, Anxiety, Arthritis, Chest pain on exertion, Cyst of left kidney (2015), DDD (degenerative disc disease), lumbar (03/2014), Depression, Depression with anxiety, Diabetes type 2, controlled ([place]) (2011), Dyspnea, Glaucoma, History of ulcer disease, HLD (hyperlipidemia), Hypertension, PONV (postoperative nausea and vomiting), and Ulcer. Also  has a past surgical history that includes Total knee arthroplasty (Right, 2004); Abdominal hysterectomy (2007); Total knee revision (Right, 03/2014); Cataract extraction w/ intraocular lens implant (Bilateral); Glaucoma surgery; Hammer toe surgery (Bilateral); Colonoscopy (04/2017); Tonsillectomy; and Right hip replacement.  Objective:   Vitals: BP (!) 124/59 (BP Location: Left Arm)   Pulse 84   Temp 99.4 F (37.4 C) (Oral)   Resp 20   SpO2 99%   Physical Exam  Constitutional: She is oriented to person, place, and time. She appears well-developed and well-nourished.  HENT:  TM's intact bilaterally, no effusions or erythema. Nasal turbinates erythematous, dry, nasal passages patent. Bilateral maxillary sinus tenderness. Oropharynx with post-nasal drainage, mucous membranes moist.    Eyes: Right eye exhibits no discharge. Left eye exhibits no discharge.  Neck: Normal range of motion. Neck supple.  Cardiovascular: Normal rate, regular rhythm and intact distal pulses. Exam reveals no gallop and no friction rub.  No murmur  heard. Pulmonary/Chest: No respiratory distress. She has no wheezes. She has no rales.  Lymphadenopathy:    She has no cervical adenopathy.  Neurological: She is alert and oriented to person, place, and time.  Skin: Skin is warm and dry.  Psychiatric: She has a normal mood and affect.   Assessment and Plan :   Acute non-recurrent maxillary sinusitis  Sinus pain  Acute ear pain, bilateral  Cough  Sore throat  Environmental allergies  Start amoxicillin for sinusitis. Use supportive care otherwise, restart allergy medications. Return-to-clinic precautions discussed, patient verbalized understanding.    Jaynee Eagles, [place] 11/05/17 1825

## 2017-11-06 ENCOUNTER — Other Ambulatory Visit: Payer: Self-pay | Admitting: Nurse Practitioner

## 2017-11-06 NOTE — Telephone Encounter (Signed)
Copied from [place] [id number]. Topic: Quick Communication - Rx Refill/Question >> Nov 06, 2017  4:02 PM Lolita Rieger, [place] wrote: Medication: xanax 1 mg   Has the patient contacted their pharmacy? yes   (Agent: If no, request that the patient contact the pharmacy for the refill.)   Preferred Pharmacy (with phone number or street name): [facility] on [address]: Please be advised that RX refills may take up to 3 business days. We ask that you follow-up with your pharmacy.

## 2017-11-07 NOTE — Telephone Encounter (Signed)
Refill of Xanax  LOV 09/14/17  [facility]  Pharmacy:  [facility] [place], [address] [place]

## 2017-11-07 NOTE — Telephone Encounter (Signed)
ROUTE TO CMA

## 2017-11-08 MED ORDER — ALPRAZOLAM 0.25 MG PO TABS: 0.2500 mg | ORAL_TABLET | Freq: Every evening | ORAL | 0 refills | Status: DC | PRN

## 2017-11-08 NOTE — Telephone Encounter (Signed)
Last refill was 09/06/17 per database.

## 2017-12-10 ENCOUNTER — Telehealth: Payer: Self-pay | Admitting: *Deleted

## 2017-12-10 MED ORDER — ALPRAZOLAM 0.25 MG PO TABS: 0.2500 mg | ORAL_TABLET | Freq: Every evening | ORAL | 0 refills | Status: DC | PRN

## 2017-12-10 NOTE — Telephone Encounter (Signed)
Called [facility]ill [facility] spoke w/pharmacist Meagan gave Mickel Baas approval. Updated med list../lmb

## 2017-12-10 NOTE — Telephone Encounter (Signed)
Okay to refill as requested; she needs to schedule OV with [name]; it looks like she was wanting to see her in follow-up.

## 2017-12-10 NOTE — Telephone Encounter (Signed)
Rec'd fax pt requesting refill on her Alprazolam 0.25mg  # 30 take 1 at bedtimes as needed. Check [place] registry last filled 11/08/2017 pls advise...Johny Chess

## 2017-12-14 ENCOUNTER — Encounter: Payer: Self-pay | Admitting: Endocrinology

## 2017-12-17 ENCOUNTER — Other Ambulatory Visit: Payer: Self-pay | Admitting: Urgent Care

## 2018-01-06 ENCOUNTER — Telehealth: Payer: Self-pay | Admitting: Family

## 2018-01-07 NOTE — Telephone Encounter (Signed)
Pls contact pt for follow-up appt for refills if she still need.Marland KitchenJohny [name]

## 2018-01-07 NOTE — Telephone Encounter (Signed)
appt made

## 2018-01-07 NOTE — Telephone Encounter (Signed)
I am not going to refill her Xanax today; I refilled in April and she was asked to schedule follow-up with [name] to get more refills.

## 2018-01-08 ENCOUNTER — Other Ambulatory Visit: Payer: Self-pay

## 2018-01-08 DIAGNOSIS — M19012 Primary osteoarthritis, left shoulder: Secondary | ICD-10-CM | POA: Diagnosis not present

## 2018-01-08 DIAGNOSIS — G8929 Other chronic pain: Secondary | ICD-10-CM | POA: Insufficient documentation

## 2018-01-08 DIAGNOSIS — M25512 Pain in left shoulder: Secondary | ICD-10-CM | POA: Diagnosis not present

## 2018-01-08 DIAGNOSIS — M75122 Complete rotator cuff tear or rupture of left shoulder, not specified as traumatic: Secondary | ICD-10-CM | POA: Diagnosis not present

## 2018-01-08 MED ORDER — GLIPIZIDE ER 5 MG PO TB24: ORAL_TABLET | ORAL | 2 refills | Status: DC

## 2018-01-11 ENCOUNTER — Ambulatory Visit: Payer: Medicare Other | Admitting: Nurse Practitioner

## 2018-01-11 ENCOUNTER — Other Ambulatory Visit: Payer: Self-pay

## 2018-01-11 DIAGNOSIS — E119 Type 2 diabetes mellitus without complications: Secondary | ICD-10-CM

## 2018-01-14 DIAGNOSIS — M75122 Complete rotator cuff tear or rupture of left shoulder, not specified as traumatic: Secondary | ICD-10-CM | POA: Diagnosis not present

## 2018-01-14 DIAGNOSIS — M47812 Spondylosis without myelopathy or radiculopathy, cervical region: Secondary | ICD-10-CM | POA: Insufficient documentation

## 2018-01-14 DIAGNOSIS — G8929 Other chronic pain: Secondary | ICD-10-CM | POA: Diagnosis not present

## 2018-01-14 DIAGNOSIS — M25512 Pain in left shoulder: Secondary | ICD-10-CM | POA: Diagnosis not present

## 2018-01-14 DIAGNOSIS — Z888 Allergy status to other drugs, medicaments and biological substances status: Secondary | ICD-10-CM | POA: Diagnosis not present

## 2018-01-14 DIAGNOSIS — M542 Cervicalgia: Secondary | ICD-10-CM | POA: Diagnosis not present

## 2018-01-14 DIAGNOSIS — Z885 Allergy status to narcotic agent status: Secondary | ICD-10-CM | POA: Diagnosis not present

## 2018-01-15 MED ORDER — ALPRAZOLAM 0.25 MG PO TABS: 0.2500 mg | ORAL_TABLET | Freq: Every evening | ORAL | 0 refills | Status: DC | PRN

## 2018-01-15 NOTE — Telephone Encounter (Signed)
Patient checking status. Please advise °

## 2018-01-15 NOTE — Telephone Encounter (Signed)
Refill sent. Please tell her to keep her 6/10 follow up as scheduled

## 2018-01-15 NOTE — Telephone Encounter (Signed)
Called pt no answer LMOM w/ashleigh response.Melissa KitchenJohny Chess

## 2018-01-21 ENCOUNTER — Ambulatory Visit (INDEPENDENT_AMBULATORY_CARE_PROVIDER_SITE_OTHER): Payer: Medicare Other | Admitting: Orthopedic Surgery

## 2018-01-23 DIAGNOSIS — M75122 Complete rotator cuff tear or rupture of left shoulder, not specified as traumatic: Secondary | ICD-10-CM | POA: Diagnosis not present

## 2018-02-05 DIAGNOSIS — M75122 Complete rotator cuff tear or rupture of left shoulder, not specified as traumatic: Secondary | ICD-10-CM | POA: Diagnosis not present

## 2018-02-05 DIAGNOSIS — I10 Essential (primary) hypertension: Secondary | ICD-10-CM | POA: Diagnosis not present

## 2018-02-05 DIAGNOSIS — M47812 Spondylosis without myelopathy or radiculopathy, cervical region: Secondary | ICD-10-CM | POA: Diagnosis not present

## 2018-02-05 DIAGNOSIS — E1151 Type 2 diabetes mellitus with diabetic peripheral angiopathy without gangrene: Secondary | ICD-10-CM | POA: Diagnosis not present

## 2018-02-05 DIAGNOSIS — J309 Allergic rhinitis, unspecified: Secondary | ICD-10-CM | POA: Diagnosis not present

## 2018-02-05 DIAGNOSIS — Z79899 Other long term (current) drug therapy: Secondary | ICD-10-CM | POA: Diagnosis not present

## 2018-02-05 DIAGNOSIS — Z7982 Long term (current) use of aspirin: Secondary | ICD-10-CM | POA: Diagnosis not present

## 2018-02-05 DIAGNOSIS — Z7984 Long term (current) use of oral hypoglycemic drugs: Secondary | ICD-10-CM | POA: Diagnosis not present

## 2018-02-05 DIAGNOSIS — G8929 Other chronic pain: Secondary | ICD-10-CM | POA: Diagnosis not present

## 2018-02-05 DIAGNOSIS — M25511 Pain in right shoulder: Secondary | ICD-10-CM | POA: Diagnosis not present

## 2018-02-05 DIAGNOSIS — G4733 Obstructive sleep apnea (adult) (pediatric): Secondary | ICD-10-CM | POA: Diagnosis not present

## 2018-02-11 ENCOUNTER — Encounter: Payer: Self-pay | Admitting: Nurse Practitioner

## 2018-02-11 ENCOUNTER — Ambulatory Visit (INDEPENDENT_AMBULATORY_CARE_PROVIDER_SITE_OTHER): Payer: Medicare Other | Admitting: Nurse Practitioner

## 2018-02-11 ENCOUNTER — Encounter

## 2018-02-11 VITALS — BP 124/80 | HR 81 | Temp 98.6°F | Resp 16 | Ht 66.0 in | Wt 211.8 lb

## 2018-02-11 DIAGNOSIS — F419 Anxiety disorder, unspecified: Secondary | ICD-10-CM | POA: Diagnosis not present

## 2018-02-11 DIAGNOSIS — E119 Type 2 diabetes mellitus without complications: Secondary | ICD-10-CM | POA: Diagnosis not present

## 2018-02-11 DIAGNOSIS — I10 Essential (primary) hypertension: Secondary | ICD-10-CM | POA: Diagnosis not present

## 2018-02-11 DIAGNOSIS — R221 Localized swelling, mass and lump, neck: Secondary | ICD-10-CM | POA: Diagnosis not present

## 2018-02-11 DIAGNOSIS — E7800 Pure hypercholesterolemia, unspecified: Secondary | ICD-10-CM

## 2018-02-11 DIAGNOSIS — F32A Depression, unspecified: Secondary | ICD-10-CM

## 2018-02-11 DIAGNOSIS — F329 Major depressive disorder, single episode, unspecified: Secondary | ICD-10-CM | POA: Diagnosis not present

## 2018-02-11 MED ORDER — BLOOD GLUCOSE MONITOR KIT: PACK | 0 refills | Status: AC

## 2018-02-11 MED ORDER — ALPRAZOLAM 0.25 MG PO TABS: 0.2500 mg | ORAL_TABLET | Freq: Every evening | ORAL | 0 refills | Status: DC | PRN

## 2018-02-11 MED ORDER — LISINOPRIL-HYDROCHLOROTHIAZIDE 10-12.5 MG PO TABS: 1.0000 | ORAL_TABLET | Freq: Every day | ORAL | 1 refills | Status: DC

## 2018-02-11 MED ORDER — GLIPIZIDE ER 5 MG PO TB24: ORAL_TABLET | ORAL | 2 refills | Status: DC

## 2018-02-11 NOTE — Assessment & Plan Note (Signed)
BP readings have been low at home  Will decrease dosage of lisinopril to 10 daily, continue HCTZ at current dosage Continue to monitor BP readings at home RTC In 3 weeks for F/U- recheck BP reading and home log - lisinopril-hydrochlorothiazide (PRINZIDE,ZESTORETIC) 10-12.5 MG tablet; Take 1 tablet by mouth daily.  Dispense: 30 tablet; Refill: 1

## 2018-02-11 NOTE — Assessment & Plan Note (Signed)
Stable Discussed long term risks of xanax including falls, addiction and memory loss, she wishes to continue at current dosage No dosage increases will be made, we will consider alternative treatments for sleep/anxiety if current dosage does not provide symptom relief in the future F/U for new, worsening symptoms - ALPRAZolam (XANAX) 0.25 MG tablet; Take 1 tablet (0.25 mg total) by mouth at bedtime as needed. for sleep  Dispense: 30 tablet; Refill: 0

## 2018-02-11 NOTE — Assessment & Plan Note (Signed)
Upcoming endocrinology appointment Continue current medications Sent new glucometer/supplies today - glipiZIDE (GLUCOTROL XL) 5 MG 24 hr tablet; TAKE 1 TO 2 TABLETS(5 TO 10 MG) BY MOUTH DAILY WITH BREAKFAST  Dispense: 60 tablet; Refill: 2

## 2018-02-11 NOTE — Progress Notes (Signed)
Name: Melissa [name]   MRN: [id number]    DOB: 07/30/1951   Date:02/11/2018       Progress Note  Subjective  Chief Complaint  Chief Complaint  Patient presents with  . Medication Refill    all meds and new meter sent    HPI Melissa [name] is here today requesting several medication refills and would also like evaluation of a neck mass. She is scheduled for rotator cuff surgery tomorrow  Neck mass- She c/o tender palpable mass under her chin. This has been present for about 1 month  She denies fevers, chills, weakness, erythema, drainage, trouble swallowing or breathing, rash.  Anxiety- maintained on xanax 0.25 daily at bedtime for years. She feels like the xanax helps her mind stop racing so that she can get to sleep. She denies depression, restlessness, mood swings, thoughts of hurting herself or others  Diabetes- maintained on glipizide 5 She is establishing with endocrinology for diabetes follow up on 04/17/18 Reports her meter is broken so not recently checking blood sugars but requests new meter today.  Lab Results  Component Value Date   HGBA1C 6.2 09/18/2017   Hypertension -maintained on lisinopril-HCTZ 20-12.5 daily. Reports daily medication compliance without noted adverse medication effects. Reports she occasionally checks her blood pressure readings at home and is concerned that sometimes her readings have been lower over the past few months, occasionally in the 90s/60s.  Denies headaches, vision changes, chest pain, shortness of breath, edema.  BP Readings from Last 3 Encounters:  02/11/18 124/80  11/05/17 (!) 124/59  09/14/17 130/88    Patient Active Problem List   Diagnosis Date Noted  . Snoring 09/14/2017  . Rotator cuff tear 02/02/2017  . Acquired renal cyst of left kidney 01/01/2016  . DDD (degenerative disc disease), lumbar 03/04/2014  . Plantar fasciitis of left foot 10/01/2013  . IBS (irritable bowel syndrome) 05/13/2013  . Hammer toe, acquired  02/11/2013  . Metatarsalgia of both feet 02/11/2013  . Obesity 11/13/2012  . Glaucoma   . Type 2 diabetes mellitus, controlled ([place])   . Anxiety and depression   . HTN (hypertension) 01/06/2012  . GERD (gastroesophageal reflux disease) 01/06/2012  . Hypercholesteremia 01/06/2012    Past Surgical History:  Procedure Laterality Date  . ABDOMINAL HYSTERECTOMY  2007   fibroids, heavy bleeding  . CATARACT EXTRACTION W/ INTRAOCULAR LENS IMPLANT Bilateral   . COLONOSCOPY  04/2017   TA, diverticulosis, rpt ? ([place])  . GLAUCOMA SURGERY    . HAMMER TOE SURGERY Bilateral   . Right hip replacement    . TONSILLECTOMY    . TOTAL KNEE ARTHROPLASTY Right 2004   TKR (Dr. Eddie Dibbles with [facility])  . TOTAL KNEE REVISION Right 03/2014   Dr Al Corpus [facility]    Family History  Problem Relation Age of Onset  . Cancer Maternal Grandmother 9       ovarian  . Diabetes Maternal Grandmother   . Hypertension Maternal Grandmother   . Cancer Mother 45       bone, MM  . Stroke Other        unsure who  . CAD Neg Hx   . Colon cancer Neg Hx   . Esophageal cancer Neg Hx   . Rectal cancer Neg Hx   . Stomach cancer Neg Hx     Social History   Socioeconomic History  . Marital status: Married    Spouse name: Not on file  . Number of children: 0  . Years of  education: 16  . Highest education level: Not on file  Occupational History  . Occupation: work with disabilities    Employer: [employer]  . Financial resource strain: Not on file  . Food insecurity:    Worry: Not on file    Inability: Not on file  . Transportation needs:    Medical: Not on file    Non-medical: Not on file  Tobacco Use  . Smoking status: Never Smoker  . Smokeless tobacco: Never Used  Substance and Sexual Activity  . Alcohol use: No  . Drug use: No  . Sexual activity: Not Currently    Birth control/protection: Surgical  Lifestyle  . Physical activity:    Days per week: Not on file    Minutes per  session: Not on file  . Stress: Not on file  Relationships  . Social connections:    Talks on phone: Not on file    Gets together: Not on file    Attends religious service: Not on file    Active member of club or organization: Not on file    Attends meetings of clubs or organizations: Not on file    Relationship status: Not on file  . Intimate partner violence:    Fear of current or ex partner: Not on file    Emotionally abused: Not on file    Physically abused: Not on file    Forced sexual activity: Not on file  Other Topics Concern  . Not on file  Social History Narrative   Caffeine: rare   Lives alone   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[employer]   Fun/Hobbies: Gardening      Current Outpatient Medications:  .  ALPRAZolam (XANAX) 0.25 MG tablet, Take 1 tablet (0.25 mg total) by mouth at bedtime as needed. for sleep, Disp: 30 tablet, Rfl: 0 .  aspirin EC 81 MG tablet, Take 81 mg by mouth daily., Disp: , Rfl:  .  glipiZIDE (GLUCOTROL XL) 5 MG 24 hr tablet, TAKE 1 TO 2 TABLETS(5 TO 10 MG) BY MOUTH DAILY WITH BREAKFAST, Disp: 180 tablet, Rfl: 2 .  IBUPROFEN PO, Take by mouth as needed., Disp: , Rfl:  .  ipratropium (ATROVENT) 0.03 % nasal spray, USE 2 SPRAYS IN EACH NOSTRIL TWICE DAILY, Disp: 30 mL, Rfl: 0 .  lisinopril-hydrochlorothiazide (ZESTORETIC) 20-12.5 MG tablet, Take 1 tablet by mouth every morning., Disp: 90 tablet, Rfl: 1 .  blood glucose meter kit and supplies KIT, Dispense based on patient and insurance preference. Use up to four times daily as directed. DX Code: E11.9, Disp: 1 each, Rfl: 0  Current Facility-Administered Medications:  .  0.9 %  sodium chloride infusion, 500 mL, Intravenous, Continuous, Ladene Artist, MD  Allergies  Allergen Reactions  . Codeine Other (See Comments)    palpitations  . Metformin And Related Nausea Only  . Oxycodone Nausea And Vomiting     ROS See PIHPI  Objective  Vitals:   02/11/18 1347  BP: 124/80   Pulse: 81  Resp: 16  Temp: 98.6 F (37 C)  TempSrc: Oral  SpO2: 96%  Weight: 211 lb 12.8 oz (96.1 kg)  Height: 5' 6"  (1.676 m)   Body mass index is 34.19 kg/m.  Physical Exam Vital signs reviewed. Constitutional: Patient appears well-developed and well-nourished. No distress.  HENT: Head: Normocephalic and atraumatic. Nose: Nose normal. Mouth/Throat: Oropharynx is clear and moist. No oropharyngeal exudate.  Eyes: Conjunctivae and EOM are normal. Pupils are  equal, round, and reactive to light. No scleral icterus.  Neck: Normal range of motion. Neck supple. No thyromegaly present. No cervical adenopathy. Barely palpable, moveable, tender right submandibular mass without erythema or exudate. Cardiovascular: Normal rate, regular rhythm and normal heart sounds.  No murmur heard. No BLE edema. Distal pulses intact. Pulmonary/Chest: Effort normal and breath sounds normal. No respiratory distress. Musculoskeletal: Normal range of motion. No gross deformities Neurological: She is alert and oriented to person, place, and time. Coordination, balance, strength, speech and gait are normal.  Skin: Skin is warm and dry. No rash noted. No erythema.  Psychiatric: Patient has a normal mood and affect. behavior is normal. Judgment and thought content normal.  Assessment & Plan RTC in 3 weeks for F/U: HTN- decreasing lisinopril-HCTZ dosage; Neck mass- follow up; HLD-try pravastatin? CMET stable 09/18/17  Neck mass Suspicious for salivary stone- discussed home management and return precautions Update CBC- She is having shoulder surgery tomorrow, no additional tests will be ordered at this visit RTC in 3 weeks for F/U, will consider additional testing if symptoms persist - CBC with Differential/Platelet; Future

## 2018-02-11 NOTE — Assessment & Plan Note (Signed)
Last lipid panel shows elevated cholesterol  She has been off any cholesterol lowering agents for some time due to intolerances We will consider trying pravastatin qod after her surgery if she is willing, high ASCVD risk at this time, we should try to get her total and LDL cholesterol down to decrease her risk RTC in 3 weeks for F/U  Lab Results  Component Value Date   CHOL 264 (H) 09/18/2017   HDL 58.00 09/18/2017   LDLCALC 141 (H) 04/21/2014   LDLDIRECT 183.0 09/18/2017   TRIG 211.0 (H) 09/18/2017   CHOLHDL 5 09/18/2017

## 2018-02-11 NOTE — Patient Instructions (Addendum)
Please head downstairs for lab work/x-rays. If any of your test results are critically abnormal, you will be contacted right away. Your results may be released to your MyChart for viewing before I am able to provide you with my response. I will contact you within a week about your test results and any recommendations for abnormalities.  I have reduced your blood pressure medication to lisinopril- HCTZ 10-12.5  Please return in about 3 weeks so I can recheck your blood pressure and recheck the spot on your neck. We will also talk about your cholesterol at this visit.    Salivary Stone A salivary stone is a mineral deposit that builds up in the ducts that drain your salivary glands. Most salivary gland stones are made of calcium. When a stone forms, saliva can back up into the gland and cause painful swelling. Your salivary glands are the glands that produce spit (saliva). You have six major salivary glands. Each gland has a duct that carries saliva into your mouth. Saliva keeps your mouth moist and breaks down the food that you eat. It also helps to prevent tooth decay. Two salivary glands are located just in front of your ears (parotid). The ducts for these glands open up inside your cheeks, near your back teeth. You also have two glands under your tongue (sublingual) and two glands under your jaw (submandibular). The ducts for these glands open under your tongue. A stone can form in any salivary gland. The most common place for a salivary stone to develop is in a submandibular salivary gland. What are the causes? Any condition that reduces the flow of saliva may lead to stone formation. It is not known why some people form stones and others do not. What increases the risk? You may be more likely to develop a salivary stone if you:  Are female.  Do not drink enough water.  Smoke.  Have high blood pressure.  Have gout.  Have diabetes.  What are the signs or symptoms? The main sign of a  salivary gland stone is sudden swelling of a salivary gland when eating. This usually happens under the jaw on one side. Other signs and symptoms include:  Swelling of the cheek or under the tongue when eating.  Pain in the swollen area.  Trouble chewing or swallowing.  Swelling that goes down after eating.  How is this diagnosed? Your health care provider may diagnose a salivary gland stone based on your signs and symptoms. The health care provider will also do a physical exam. In many cases, a stone can be felt in a duct inside your mouth. You may need to see an ear, nose, and throat specialist (ENT or otolaryngologist) for diagnosis and treatment. You may also need to have diagnostic tests. These may include imaging studies to check for a stone, such as:  X-rays.  Ultrasound.  CT scan.  MRI.  How is this treated? Home care may be enough to treat a small stone that is not causing symptoms. Treatment of a stone that is large enough to cause symptoms may include:  Probing and widening the duct to allow the stone to pass.  Inserting a thin, flexible scope (endoscope) into the duct to locate and remove the stone.  Breaking up the stone with sound waves.  Removing the entire salivary gland.  Follow these instructions at home:  Drink enough fluid to keep your urine clear or pale yellow.  Follow these instructions every few hours: ? Suck on a  lemon candy to stimulate the flow of saliva. ? Put a hot compress over the gland. ? Gently massage the gland.  Do not use any tobacco products, including cigarettes, chewing tobacco, or electronic cigarettes. If you need help quitting, ask your health care provider. Contact a health care provider if:  You have pain and swelling in your face, jaw, or mouth after eating.  You have persistent swelling in any of these places: ? In front of your ear. ? Under your jaw. ? Inside your mouth. Get help right away if:  You have pain and  swelling in your face, jaw, or mouth that are getting worse.  Your pain and swelling make it hard to swallow or breathe. This information is not intended to replace advice given to you by your health care provider. Make sure you discuss any questions you have with your health care provider. Document Released: 09/28/2004 Document Revised: 01/27/2016 Document Reviewed: 01/21/2014 Elsevier Interactive Patient Education  2018 [employer].

## 2018-02-12 ENCOUNTER — Other Ambulatory Visit: Payer: Self-pay

## 2018-02-12 DIAGNOSIS — G4733 Obstructive sleep apnea (adult) (pediatric): Secondary | ICD-10-CM | POA: Diagnosis not present

## 2018-02-12 DIAGNOSIS — J309 Allergic rhinitis, unspecified: Secondary | ICD-10-CM | POA: Diagnosis not present

## 2018-02-12 DIAGNOSIS — I10 Essential (primary) hypertension: Secondary | ICD-10-CM | POA: Diagnosis not present

## 2018-02-12 DIAGNOSIS — Z79899 Other long term (current) drug therapy: Secondary | ICD-10-CM | POA: Diagnosis not present

## 2018-02-12 DIAGNOSIS — Z7982 Long term (current) use of aspirin: Secondary | ICD-10-CM | POA: Diagnosis not present

## 2018-02-12 DIAGNOSIS — M25511 Pain in right shoulder: Secondary | ICD-10-CM | POA: Diagnosis not present

## 2018-02-12 DIAGNOSIS — Z7984 Long term (current) use of oral hypoglycemic drugs: Secondary | ICD-10-CM | POA: Diagnosis not present

## 2018-02-12 DIAGNOSIS — M75122 Complete rotator cuff tear or rupture of left shoulder, not specified as traumatic: Secondary | ICD-10-CM | POA: Diagnosis not present

## 2018-02-12 DIAGNOSIS — E1151 Type 2 diabetes mellitus with diabetic peripheral angiopathy without gangrene: Secondary | ICD-10-CM | POA: Diagnosis not present

## 2018-02-12 DIAGNOSIS — G8918 Other acute postprocedural pain: Secondary | ICD-10-CM | POA: Diagnosis not present

## 2018-02-12 DIAGNOSIS — M47812 Spondylosis without myelopathy or radiculopathy, cervical region: Secondary | ICD-10-CM | POA: Diagnosis not present

## 2018-02-12 DIAGNOSIS — G8929 Other chronic pain: Secondary | ICD-10-CM | POA: Diagnosis not present

## 2018-02-12 MED ORDER — LISINOPRIL-HYDROCHLOROTHIAZIDE 10-12.5 MG PO TABS
1.0000 | ORAL_TABLET | Freq: Every day | ORAL | 1 refills | Status: DC
Start: 2018-02-12 — End: 2018-04-15

## 2018-02-13 DIAGNOSIS — G4733 Obstructive sleep apnea (adult) (pediatric): Secondary | ICD-10-CM | POA: Diagnosis not present

## 2018-02-13 DIAGNOSIS — I10 Essential (primary) hypertension: Secondary | ICD-10-CM | POA: Diagnosis not present

## 2018-02-13 DIAGNOSIS — Z7984 Long term (current) use of oral hypoglycemic drugs: Secondary | ICD-10-CM | POA: Diagnosis not present

## 2018-02-13 DIAGNOSIS — M75122 Complete rotator cuff tear or rupture of left shoulder, not specified as traumatic: Secondary | ICD-10-CM | POA: Diagnosis not present

## 2018-02-13 DIAGNOSIS — G8929 Other chronic pain: Secondary | ICD-10-CM | POA: Diagnosis not present

## 2018-02-13 DIAGNOSIS — M47812 Spondylosis without myelopathy or radiculopathy, cervical region: Secondary | ICD-10-CM | POA: Diagnosis not present

## 2018-02-13 DIAGNOSIS — Z7982 Long term (current) use of aspirin: Secondary | ICD-10-CM | POA: Diagnosis not present

## 2018-02-13 DIAGNOSIS — E1151 Type 2 diabetes mellitus with diabetic peripheral angiopathy without gangrene: Secondary | ICD-10-CM | POA: Diagnosis not present

## 2018-02-13 DIAGNOSIS — J309 Allergic rhinitis, unspecified: Secondary | ICD-10-CM | POA: Diagnosis not present

## 2018-02-13 DIAGNOSIS — M25511 Pain in right shoulder: Secondary | ICD-10-CM | POA: Diagnosis not present

## 2018-02-13 DIAGNOSIS — Z79899 Other long term (current) drug therapy: Secondary | ICD-10-CM | POA: Diagnosis not present

## 2018-02-18 ENCOUNTER — Telehealth: Payer: Self-pay | Admitting: *Deleted

## 2018-02-18 NOTE — Telephone Encounter (Signed)
"  I was supposed to have surgery on my foot last summer.  Things came up and I wasn't able to do it.  I just had a rotator cuff surgery last week.  My stitches come out next week.  I will follow up about it three weeks later.  I should be okay to have foot surgery after that time period right?"  Yes, you should be okay by then.  "I'll call you in a couple of weeks to schedule an appointment to see her.  Thank you so much for listening to me and answering my questions."

## 2018-02-20 ENCOUNTER — Institutional Professional Consult (permissible substitution): Payer: Medicare Other | Admitting: Pulmonary Disease

## 2018-02-20 ENCOUNTER — Ambulatory Visit (INDEPENDENT_AMBULATORY_CARE_PROVIDER_SITE_OTHER): Payer: Medicare Other | Admitting: Nurse Practitioner

## 2018-02-20 ENCOUNTER — Encounter: Payer: Self-pay | Admitting: Nurse Practitioner

## 2018-02-20 ENCOUNTER — Other Ambulatory Visit (INDEPENDENT_AMBULATORY_CARE_PROVIDER_SITE_OTHER): Payer: Medicare Other

## 2018-02-20 VITALS — BP 122/76 | HR 71 | Temp 98.2°F | Resp 16 | Ht 66.0 in | Wt 209.0 lb

## 2018-02-20 DIAGNOSIS — I10 Essential (primary) hypertension: Secondary | ICD-10-CM | POA: Diagnosis not present

## 2018-02-20 DIAGNOSIS — R7989 Other specified abnormal findings of blood chemistry: Secondary | ICD-10-CM

## 2018-02-20 DIAGNOSIS — E7800 Pure hypercholesterolemia, unspecified: Secondary | ICD-10-CM

## 2018-02-20 DIAGNOSIS — R221 Localized swelling, mass and lump, neck: Secondary | ICD-10-CM

## 2018-02-20 DIAGNOSIS — R6889 Other general symptoms and signs: Secondary | ICD-10-CM | POA: Diagnosis not present

## 2018-02-20 LAB — CBC WITH DIFFERENTIAL/PLATELET
Basophils Absolute: 0.1 10*3/uL (ref 0.0–0.1)
Basophils Relative: 1.9 % (ref 0.0–3.0)
Eosinophils Absolute: 0.2 10*3/uL (ref 0.0–0.7)
Eosinophils Relative: 4.5 % (ref 0.0–5.0)
HCT: 34.7 % — ABNORMAL LOW (ref 36.0–46.0)
Hemoglobin: 11.4 g/dL — ABNORMAL LOW (ref 12.0–15.0)
Lymphocytes Relative: 34.3 % (ref 12.0–46.0)
Lymphs Abs: 1.7 10*3/uL (ref 0.7–4.0)
MCHC: 32.8 g/dL (ref 30.0–36.0)
MCV: 82.8 fl (ref 78.0–100.0)
Monocytes Absolute: 0.4 10*3/uL (ref 0.1–1.0)
Monocytes Relative: 7.7 % (ref 3.0–12.0)
Neutro Abs: 2.5 10*3/uL (ref 1.4–7.7)
Neutrophils Relative %: 51.6 % (ref 43.0–77.0)
Platelets: 417 10*3/uL — ABNORMAL HIGH (ref 150.0–400.0)
RBC: 4.19 Mil/uL (ref 3.87–5.11)
RDW: 14.9 % (ref 11.5–15.5)
WBC: 4.9 10*3/uL (ref 4.0–10.5)

## 2018-02-20 MED ORDER — CEPHALEXIN 500 MG PO CAPS: 500.0000 mg | ORAL_CAPSULE | Freq: Four times a day (QID) | ORAL | 0 refills | Status: DC

## 2018-02-20 NOTE — Assessment & Plan Note (Signed)
Last lipid panel shows elevated cholesterol  She has been off any cholesterol lowering agents for some time due to intolerances We discussed that due to her hx of HTN, DM and HLD, statin is recommended to reduce her risk for stroke or MI and she seems agreeable to try pravastatin qod, but will hold starting new medications for now while evaluation of neck mass is ongoing F/U in 1 month- if she is stable we can try starting pravastatin at this visit

## 2018-02-20 NOTE — Progress Notes (Signed)
Name: Melissa [name]   MRN: [id number]    DOB: June 25, 1951   Date:02/20/2018       Progress Note  Subjective  Chief Complaint  Chief Complaint  Patient presents with  . Follow-up    neck mass which she states has gotten worse, tender to touch, cholestrol check?    HPI  Neck mass- She was first seen for this on 02/11/18 with history and PE suspicious for a salivary stone. No treatment was given other than home management of salivary stone, as she was scheduled to have shoulder surgery the next day. A CBC was ordered but she did not go to lab to have this drawn. Today, she says the mass has persisted and seems to have moved down her neck, remains tender to touch. She has tried drinking more fluids, lemon water, sucking on lemon at home with no relief She denies fevers, chills, weakness, erythema, drainage, trouble swallowing or breathing, rash. She is now 1 week post-op from shoulder surgery, reports surgery went well and she feels well overall.  Hypertension -her lisinopril-HCTZ dosage was decreased to 10-12.5 daily at last OV due to her report of lower home readings in the 90s/60s. Reports she has reduced her dosage as instructed and has noted readings 120s/70s since starting new medication dosage. Denies headaches, vision changes, chest pain, shortness of breath, edema.  BP Readings from Last 3 Encounters:  02/20/18 122/76  02/11/18 124/80  11/05/17 (!) 124/59    Patient Active Problem List   Diagnosis Date Noted  . Snoring 09/14/2017  . Rotator cuff tear 02/02/2017  . Acquired renal cyst of left kidney 01/01/2016  . DDD (degenerative disc disease), lumbar 03/04/2014  . Plantar fasciitis of left foot 10/01/2013  . IBS (irritable bowel syndrome) 05/13/2013  . Hammer toe, acquired 02/11/2013  . Metatarsalgia of both feet 02/11/2013  . Obesity 11/13/2012  . Glaucoma   . Type 2 diabetes mellitus, controlled ([employer])   . Anxiety and depression   . HTN (hypertension) 01/06/2012   . GERD (gastroesophageal reflux disease) 01/06/2012  . Hypercholesteremia 01/06/2012    Social History   Tobacco Use  . Smoking status: Never Smoker  . Smokeless tobacco: Never Used  Substance Use Topics  . Alcohol use: No     Current Outpatient Medications:  .  ALPRAZolam (XANAX) 0.25 MG tablet, Take 1 tablet (0.25 mg total) by mouth at bedtime as needed. for sleep, Disp: 30 tablet, Rfl: 0 .  aspirin EC 81 MG tablet, Take 81 mg by mouth daily., Disp: , Rfl:  .  blood glucose meter kit and supplies KIT, Dispense based on patient and insurance preference. Use up to four times daily as directed. DX Code: E11.9, Disp: 1 each, Rfl: 0 .  cephALEXin (KEFLEX) 500 MG capsule, Take 1 capsule (500 mg total) by mouth 4 (four) times daily., Disp: 28 capsule, Rfl: 0 .  glipiZIDE (GLUCOTROL XL) 5 MG 24 hr tablet, TAKE 1 TO 2 TABLETS(5 TO 10 MG) BY MOUTH DAILY WITH BREAKFAST, Disp: 60 tablet, Rfl: 2 .  IBUPROFEN PO, Take by mouth as needed., Disp: , Rfl:  .  ipratropium (ATROVENT) 0.03 % nasal spray, USE 2 SPRAYS IN EACH NOSTRIL TWICE DAILY, Disp: 30 mL, Rfl: 0 .  lisinopril-hydrochlorothiazide (PRINZIDE,ZESTORETIC) 10-12.5 MG tablet, Take 1 tablet by mouth daily., Disp: 90 tablet, Rfl: 1  Current Facility-Administered Medications:  .  0.9 %  sodium chloride infusion, 500 mL, Intravenous, Continuous, Ladene Artist, MD  Allergies  Allergen Reactions  .  Codeine Other (See Comments)    palpitations  . Metformin And Related Nausea Only  . Oxycodone Nausea And Vomiting    ROS  No other specific complaints in a complete review of systems (except as listed in HPI above).  Objective  Vitals:   02/20/18 0912  BP: 122/76  Pulse: 71  Resp: 16  Temp: 98.2 F (36.8 C)  TempSrc: Oral  SpO2: 98%  Weight: 209 lb (94.8 kg)  Height: 5' 6"  (1.676 m)   Body mass index is 33.73 kg/m.   Physical Exam Vital signs reviewed. Constitutional: Patient appears well-developed and well-nourished.  No distress.  HENT: Head: Normocephalic and atraumatic. Nose: Nose normal. Mouth/Throat: Oropharynx is clear and moist. No oropharyngeal exudate.  Eyes: Conjunctivae and EOM are normal. Pupils are equal, round, and reactive to light. No scleral icterus.  Neck: Normal range of motion. Neck supple. No thyromegaly present. Barely palpable, moveable, tender right superficial cervical mass without erythema or exudate. Cardiovascular: Normal rate, regular rhythm and normal heart sounds.  No murmur heard. No BLE edema. Distal pulses intact. Pulmonary/Chest: Effort normal and breath sounds normal. No respiratory distress. Neurological: She is alert and oriented to person, place, and time. Coordination, balance, strength, speech and gait are normal.  Skin: Skin is warm and dry. No rash noted. No erythema.  Psychiatric: Patient has a normal mood and affect. behavior is normal. Judgment and thought content normal.   Assessment & Plan RTC in 1 month for F/U: HTN- recheck BP; HLD-discuss pravastatin trial  Localized swelling, mass or lump of neck Start antibiotic course with keflex- dosing and side effects discussed Instructed to have CBC drawn today- ordered on last visit Imaging ordered for further workup Follow up and care instructions discussed during OV F/U TBD pending diagnostic test results - cephALEXin (KEFLEX) 500 MG capsule; Take 1 capsule (500 mg total) by mouth 4 (four) times daily.  Dispense: 28 capsule; Refill: 0 - CT Soft Tissue Neck W Contrast; Future - DG Chest 2 View; Future

## 2018-02-20 NOTE — Patient Instructions (Signed)
Please start keflex 500mg  four times a day for 7 days.  Please head downstairs for lab work and a chest x-ray. I will contact you within a week about your test results and any recommendations for abnormalities.  I have placed an order for a CT scan of your neck . Our office will call you to schedule this appointment. You should hear from our office in 7-10 days.  Please come back and see me in about 1 month, we can recheck your blood pressure and discuss treating your cholesterol.

## 2018-02-20 NOTE — Assessment & Plan Note (Signed)
BP appears stable on new dosage of lisinopril-HCTZ, continue current dosage Continue to monitor BP readings at home F/U in 1 month for BP recheck

## 2018-02-21 ENCOUNTER — Other Ambulatory Visit: Payer: Self-pay

## 2018-02-21 DIAGNOSIS — R221 Localized swelling, mass and lump, neck: Secondary | ICD-10-CM

## 2018-02-25 ENCOUNTER — Other Ambulatory Visit (INDEPENDENT_AMBULATORY_CARE_PROVIDER_SITE_OTHER): Payer: Medicare Other

## 2018-02-25 DIAGNOSIS — R221 Localized swelling, mass and lump, neck: Secondary | ICD-10-CM

## 2018-02-25 DIAGNOSIS — R7989 Other specified abnormal findings of blood chemistry: Secondary | ICD-10-CM | POA: Diagnosis not present

## 2018-02-25 LAB — IRON: Iron: 68 ug/dL (ref 42–145)

## 2018-02-25 LAB — BASIC METABOLIC PANEL
BUN: 16 mg/dL (ref 6–23)
CO2: 27 mEq/L (ref 19–32)
Calcium: 9.8 mg/dL (ref 8.4–10.5)
Chloride: 102 mEq/L (ref 96–112)
Creatinine, Ser: 0.72 mg/dL (ref 0.40–1.20)
GFR: 103.78 mL/min (ref >60.00)
Glucose, Bld: 117 mg/dL — ABNORMAL HIGH (ref 70–99)
Potassium: 3.6 mEq/L (ref 3.5–5.1)
Sodium: 138 mEq/L (ref 135–145)

## 2018-02-25 LAB — FERRITIN: Ferritin: 72 ng/mL (ref 10.0–291.0)

## 2018-02-27 DIAGNOSIS — S46012S Strain of muscle(s) and tendon(s) of the rotator cuff of left shoulder, sequela: Secondary | ICD-10-CM | POA: Diagnosis not present

## 2018-03-04 ENCOUNTER — Ambulatory Visit (INDEPENDENT_AMBULATORY_CARE_PROVIDER_SITE_OTHER)
Admission: RE | Admit: 2018-03-04 | Discharge: 2018-03-04 | Disposition: A | Discharge status: Home / Self Care | Payer: Medicare Other | Source: Ambulatory Visit | Attending: Nurse Practitioner | Admitting: Nurse Practitioner

## 2018-03-04 DIAGNOSIS — R221 Localized swelling, mass and lump, neck: Secondary | ICD-10-CM | POA: Diagnosis not present

## 2018-03-04 MED ORDER — IOPAMIDOL (ISOVUE-300) INJECTION 61%
75.0000 mL | Freq: Once | INTRAVENOUS | Status: AC | PRN
  Administered 2018-03-04: 75 mL via INTRAVENOUS

## 2018-03-05 ENCOUNTER — Inpatient Hospital Stay: Admission: RE | Admit: 2018-03-05 | Payer: Medicare Other | Source: Ambulatory Visit

## 2018-03-08 ENCOUNTER — Ambulatory Visit: Payer: Medicare Other | Admitting: Nurse Practitioner

## 2018-03-13 ENCOUNTER — Ambulatory Visit: Payer: Medicare Other | Admitting: Nurse Practitioner

## 2018-03-18 ENCOUNTER — Telehealth: Payer: Self-pay

## 2018-03-18 ENCOUNTER — Ambulatory Visit: Payer: Medicare Other | Admitting: Nurse Practitioner

## 2018-03-18 DIAGNOSIS — E7800 Pure hypercholesterolemia, unspecified: Secondary | ICD-10-CM

## 2018-03-18 NOTE — Telephone Encounter (Signed)
Copied from [place] [id number]. Topic: Appointment Scheduling - Scheduling Inquiry for Clinic >> Mar 18, 2018  9:53 AM Synthia Innocent wrote: Reason for CRM: Patient is requesting cholesterol check. Please advise  >> Mar 18, 2018 10:04 AM Morphies, Isidoro Donning wrote: Would patient need to make an appointment for this?

## 2018-03-18 NOTE — Telephone Encounter (Signed)
Orders have been placed for pt to get cholesterol rechecked. Pt is aware.

## 2018-03-25 DIAGNOSIS — Z888 Allergy status to other drugs, medicaments and biological substances status: Secondary | ICD-10-CM | POA: Diagnosis not present

## 2018-03-25 DIAGNOSIS — Z885 Allergy status to narcotic agent status: Secondary | ICD-10-CM | POA: Diagnosis not present

## 2018-03-25 DIAGNOSIS — S46012S Strain of muscle(s) and tendon(s) of the rotator cuff of left shoulder, sequela: Secondary | ICD-10-CM | POA: Diagnosis not present

## 2018-03-25 DIAGNOSIS — M47812 Spondylosis without myelopathy or radiculopathy, cervical region: Secondary | ICD-10-CM | POA: Diagnosis not present

## 2018-03-26 ENCOUNTER — Telehealth: Payer: Self-pay

## 2018-03-26 DIAGNOSIS — F419 Anxiety disorder, unspecified: Secondary | ICD-10-CM

## 2018-03-26 DIAGNOSIS — F32A Depression, unspecified: Secondary | ICD-10-CM

## 2018-03-26 DIAGNOSIS — F329 Major depressive disorder, single episode, unspecified: Secondary | ICD-10-CM

## 2018-03-26 NOTE — Telephone Encounter (Signed)
Received fax from [facility] on [address] for alprazolam. Last refill on 02/13/2018 per database. Please advise.

## 2018-03-26 NOTE — Telephone Encounter (Signed)
Lovena Le, okay to do #30 day refill;

## 2018-03-27 MED ORDER — ALPRAZOLAM 0.25 MG PO TABS: 0.2500 mg | ORAL_TABLET | Freq: Every evening | ORAL | 0 refills | Status: DC | PRN

## 2018-03-27 NOTE — Telephone Encounter (Signed)
done

## 2018-03-27 NOTE — Telephone Encounter (Signed)
Will you please send this electronically when you get a second.

## 2018-04-08 ENCOUNTER — Ambulatory Visit: Payer: Medicare Other | Admitting: Nurse Practitioner

## 2018-04-10 ENCOUNTER — Other Ambulatory Visit (INDEPENDENT_AMBULATORY_CARE_PROVIDER_SITE_OTHER): Payer: Medicare Other

## 2018-04-10 ENCOUNTER — Telehealth: Payer: Self-pay | Admitting: Nurse Practitioner

## 2018-04-10 DIAGNOSIS — E7800 Pure hypercholesterolemia, unspecified: Secondary | ICD-10-CM

## 2018-04-10 DIAGNOSIS — I10 Essential (primary) hypertension: Secondary | ICD-10-CM

## 2018-04-10 LAB — LIPID PANEL
Cholesterol: 235 mg/dL — ABNORMAL HIGH (ref 0–200)
HDL: 54.7 mg/dL (ref >39.00)
LDL Cholesterol: 155 mg/dL — ABNORMAL HIGH (ref 0–99)
NonHDL: 180.24
Total CHOL/HDL Ratio: 4
Triglycerides: 126 mg/dL (ref 0.0–149.0)
VLDL: 25.2 mg/dL (ref 0.0–40.0)

## 2018-04-10 NOTE — Telephone Encounter (Signed)
Left message on voice mail to call back regarding request for increased/change in dosage of medication.

## 2018-04-10 NOTE — Telephone Encounter (Signed)
Patient states she wants to increase her her BP medication the 20-12.5 mg due to the increase in her BP at night- 150/90, 154/100- patient has been increasing her dosage. Patient monitors daily. She states she has had recent surgery. She is going to become more active soon- but she needs to keep control of her BP now. Patient can be reached at  [phone].

## 2018-04-10 NOTE — Telephone Encounter (Signed)
Copied from [place] [id number]. Topic: Quick Communication - Rx Refill/Question >> Apr 10, 2018 11:38 AM Gardiner Ramus wrote: Medication:lisinopril-hydrochlorothiazide (PRINZIDE,ZESTORETIC) 10-12.5 MG tablet [[id number]] pt called and stated that she needs this changed to 25mg    Has the patient contacted their pharmacy? No  Preferred Pharmacy (with phone number or street name):[id number] [place], [place] - [address] [place] [phone] (Phone) [phone] (Fax)  Agent: Please be advised that RX refills may take up to 3 business days. We ask that you follow-up with your pharmacy.

## 2018-04-11 ENCOUNTER — Other Ambulatory Visit: Payer: Self-pay | Admitting: Nurse Practitioner

## 2018-04-12 ENCOUNTER — Other Ambulatory Visit: Payer: Self-pay | Admitting: Nurse Practitioner

## 2018-04-12 NOTE — Telephone Encounter (Signed)
I know she called about changing this medication.

## 2018-04-15 ENCOUNTER — Other Ambulatory Visit: Payer: Self-pay | Admitting: Family

## 2018-04-15 MED ORDER — LISINOPRIL-HYDROCHLOROTHIAZIDE 20-12.5 MG PO TABS: 1.0000 | ORAL_TABLET | Freq: Every day | ORAL | 1 refills | Status: DC

## 2018-04-15 MED ORDER — PRAVASTATIN SODIUM 40 MG PO TABS: 40.0000 mg | ORAL_TABLET | Freq: Every day | ORAL | 1 refills | Status: DC

## 2018-04-15 NOTE — Progress Notes (Signed)
Patient notified in a different result note; Rx for Pravachol sent in;

## 2018-04-15 NOTE — Telephone Encounter (Signed)
I worked with Melissa [name] this morning and she is fine to send in increased blood pressure medication. Based on recent lipid panel, she also wants to try her on Pravachol for cholesterol. Then see [name] in about 4 weeks for blood pressure and cholesterol check. Both prescriptions sent to pharmacy on file.

## 2018-04-16 ENCOUNTER — Emergency Department (HOSPITAL_COMMUNITY)
Admission: EM | Admit: 2018-04-16 | Discharge: 2018-04-16 | Disposition: A | Discharge status: Home / Self Care | Payer: Medicare Other | Attending: Emergency Medicine | Admitting: Emergency Medicine

## 2018-04-16 ENCOUNTER — Other Ambulatory Visit: Payer: Self-pay

## 2018-04-16 ENCOUNTER — Encounter (HOSPITAL_COMMUNITY): Payer: Self-pay

## 2018-04-16 DIAGNOSIS — Z7982 Long term (current) use of aspirin: Secondary | ICD-10-CM | POA: Diagnosis not present

## 2018-04-16 DIAGNOSIS — Z96651 Presence of right artificial knee joint: Secondary | ICD-10-CM | POA: Insufficient documentation

## 2018-04-16 DIAGNOSIS — H5712 Ocular pain, left eye: Secondary | ICD-10-CM | POA: Diagnosis present

## 2018-04-16 DIAGNOSIS — B029 Zoster without complications: Secondary | ICD-10-CM | POA: Diagnosis not present

## 2018-04-16 DIAGNOSIS — Z79899 Other long term (current) drug therapy: Secondary | ICD-10-CM | POA: Diagnosis not present

## 2018-04-16 DIAGNOSIS — I10 Essential (primary) hypertension: Secondary | ICD-10-CM | POA: Diagnosis not present

## 2018-04-16 DIAGNOSIS — E119 Type 2 diabetes mellitus without complications: Secondary | ICD-10-CM | POA: Insufficient documentation

## 2018-04-16 MED ORDER — PREDNISONE 20 MG PO TABS
60.0000 mg | ORAL_TABLET | Freq: Once | ORAL | Status: AC
  Administered 2018-04-16: 60 mg via ORAL
  Filled 2018-04-16: qty 3

## 2018-04-16 MED ORDER — PREDNISONE 20 MG PO TABS: ORAL_TABLET | ORAL | 0 refills | Status: AC

## 2018-04-16 MED ORDER — VALACYCLOVIR HCL 1 G PO TABS: 1000.0000 mg | ORAL_TABLET | Freq: Three times a day (TID) | ORAL | 0 refills | Status: DC

## 2018-04-16 MED ORDER — FLUORESCEIN SODIUM 1 MG OP STRP
1.0000 | ORAL_STRIP | Freq: Once | OPHTHALMIC | Status: AC
  Administered 2018-04-16: 1 via OPHTHALMIC
  Filled 2018-04-16: qty 1

## 2018-04-16 MED ORDER — TETRACAINE HCL 0.5 % OP SOLN
2.0000 [drp] | Freq: Once | OPHTHALMIC | Status: AC
  Administered 2018-04-16: 2 [drp] via OPHTHALMIC
  Filled 2018-04-16: qty 4

## 2018-04-16 NOTE — ED Provider Notes (Signed)
[facility] DEPT Provider Note   CSN: [id number] Arrival date & time: 04/16/18  1033     History   Chief Complaint Chief Complaint  Patient presents with  . Eye Pain    HPI Melissa [name] is a 67 y.o. female history of diabetes, previous glaucoma here presenting with left eye pain, rash on the face.  Patient has been having intermittent rash on the right side of her face and has saw a dermatologist and was prescribed clindamycin and hydrocortisone cream with no relief.  Since yesterday she noticed a vesicular rash on the left side of her face and today she noticed pain in the left eye.  She denies any blurry vision or loss of vision.  Patient states that she does have a history of glaucoma.  She also has a previous history of shingles and she was concerned for shingles rash.  Denies any fevers or chills, denies weakness or numbness.   The history is provided by the patient.    Past Medical History:  Diagnosis Date  . Allergy   . Anxiety   . Arthritis    s/p R TKR  . Chest pain on exertion   . Cyst of left kidney 2015   by lumbar MRI pending renal US  . DDD (degenerative disc disease), lumbar 03/2014   severe L2-3 with L HNP with L2/3 nerve root impingement [name])  . Depression   . Depression with anxiety   . Diabetes type 2, controlled ([place]) 2011   borderline  . Dyspnea   . Glaucoma   . History of ulcer disease   . HLD (hyperlipidemia)    no meds taken  . Hypertension   . PONV (postoperative nausea and vomiting)   . Ulcer     Patient Active Problem List   Diagnosis Date Noted  . Snoring 09/14/2017  . Rotator cuff tear 02/02/2017  . Acquired renal cyst of left kidney 01/01/2016  . DDD (degenerative disc disease), lumbar 03/04/2014  . Plantar fasciitis of left foot 10/01/2013  . IBS (irritable bowel syndrome) 05/13/2013  . Hammer toe, acquired 02/11/2013  . Metatarsalgia of both feet 02/11/2013  . Obesity 11/13/2012  .  Glaucoma   . Type 2 diabetes mellitus, controlled ([place])   . Anxiety and depression   . HTN (hypertension) 01/06/2012  . GERD (gastroesophageal reflux disease) 01/06/2012  . Hypercholesteremia 01/06/2012    Past Surgical History:  Procedure Laterality Date  . ABDOMINAL HYSTERECTOMY  2007   fibroids, heavy bleeding  . CATARACT EXTRACTION W/ INTRAOCULAR LENS IMPLANT Bilateral   . COLONOSCOPY  04/2017   TA, diverticulosis, rpt ? ([facility])  . GLAUCOMA SURGERY    . HAMMER TOE SURGERY Bilateral   . Right hip replacement    . TONSILLECTOMY    . TOTAL KNEE ARTHROPLASTY Right 2004   TKR (Dr. Eddie Dibbles with [facility])  . TOTAL KNEE REVISION Right 03/2014   Dr Al Corpus [facility]     OB History    Gravida  0   Para      Term      Preterm      AB      Living  0     SAB      TAB      Ectopic      Multiple      Live Births               Home Medications    Prior to  Admission medications   Medication Sig Start Date End Date Taking? Authorizing Provider  ACCU-CHEK GUIDE test strip USE AS DIRECTED TO TEST BLOOD GLUCOSE UP TO FOUR TIMES DAILY 04/11/18   Lance Sell, NP  ALPRAZolam Duanne Moron) 0.25 MG tablet Take 1 tablet (0.25 mg total) by mouth at bedtime as needed. for sleep 03/27/18   Lance Sell, NP  aspirin EC 81 MG tablet Take 81 mg by mouth daily.    [provider]  blood glucose meter kit and supplies KIT Dispense based on patient and insurance preference. Use up to four times daily as directed. DX Code: E11.9 02/11/18   Lance Sell, NP  cephALEXin (KEFLEX) 500 MG capsule Take 1 capsule (500 mg total) by mouth 4 (four) times daily. 02/20/18   Lance Sell, NP  glipiZIDE (GLUCOTROL XL) 5 MG 24 hr tablet TAKE 1 TO 2 TABLETS(5 TO 10 MG) BY MOUTH DAILY WITH BREAKFAST 02/11/18   Lance Sell, NP  IBUPROFEN PO Take by mouth as needed.    [provider]  ipratropium (ATROVENT) 0.03 % nasal spray USE 2 SPRAYS IN EACH NOSTRIL TWICE  DAILY 12/17/17   Lance Sell, NP  lisinopril-hydrochlorothiazide (PRINZIDE,ZESTORETIC) 20-12.5 MG tablet TAKE 1 TABLET BY MOUTH EVERY MORNING 04/16/18   Marrian Salvage, FNP  lisinopril-hydrochlorothiazide ([place]) 20-12.5 MG tablet TAKE 1 TABLET BY MOUTH DAILY 04/15/18   Marrian Salvage, FNP  pravastatin (PRAVACHOL) 40 MG tablet TAKE 1 TABLET(40 MG) BY MOUTH DAILY 04/15/18   Marrian Salvage, FNP    Family History Family History  Problem Relation Age of Onset  . Cancer Maternal Grandmother 78       ovarian  . Diabetes Maternal Grandmother   . Hypertension Maternal Grandmother   . Cancer Mother 50       bone, MM  . Stroke Other        unsure who  . CAD Neg Hx   . Colon cancer Neg Hx   . Esophageal cancer Neg Hx   . Rectal cancer Neg Hx   . Stomach cancer Neg Hx     Social History Social History   Tobacco Use  . Smoking status: Never Smoker  . Smokeless tobacco: Never Used  Substance Use Topics  . Alcohol use: No  . Drug use: No     Allergies   Codeine; Metformin and related; and Oxycodone   Review of Systems Review of Systems  Eyes: Positive for pain.  Skin: Positive for rash.  All other systems reviewed and are negative.    Physical Exam Updated Vital Signs BP (!) 128/92 (BP Location: Right Arm)   Pulse 88   Temp 98.6 F (37 C) (Oral)   Resp 16   Ht _0  (1.676 m)   Wt 91.6 kg   SpO2 99%   BMI 32.60 kg/m   Physical Exam  Constitutional: She is oriented to person, place, and time. She appears well-developed.  HENT:  Head: Normocephalic.  ? Small vesicular lesions L mid face. No lesions inside the ear   Eyes: Conjunctivae are normal.  L eyelid slightly swollen. Pupils not dilated. Fluorescein stain performed and there is no obvious corneal abrasion or pseudo dendrites. Extra ocular movements intact   Neck: Normal range of motion. Neck supple.  Cardiovascular: Normal rate, regular rhythm and normal heart sounds.   Pulmonary/Chest: Effort normal and breath sounds normal. No stridor. No respiratory distress. She has no wheezes.  Abdominal: Soft. Bowel sounds are  normal. She exhibits no distension. There is no tenderness.  Musculoskeletal: Normal range of motion.  Neurological: She is alert and oriented to person, place, and time. No cranial nerve deficit. Coordination normal.  Skin: Skin is warm. Capillary refill takes less than 2 seconds.  Psychiatric: She has a normal mood and affect.  Nursing note and vitals reviewed.    ED Treatments / Results  Labs (all labs ordered are listed, but only abnormal results are displayed) Labs Reviewed - No data to display  EKG None  Radiology No results found.  Procedures Procedures (including critical care time)  Medications Ordered in ED Medications  fluorescein ophthalmic strip 1 strip (has no administration in time range)  tetracaine (PONTOCAINE) 0.5 % ophthalmic solution 2 drop (has no administration in time range)     Initial Impression / Assessment and Plan / ED Course  I have reviewed the triage vital signs and the nursing notes.  Pertinent labs & imaging results that were available during my care of the patient were reviewed by me and considered in my medical decision making (see chart for details).     Amaura F Marik is a 67 y.o. female here with rash, L eye pain. Likely shingles. Will perform fluorescein stain to r/o pseudodendrites and will obtain eye pressure to r/o acute angle closure glaucoma.   11:43 AM Eye pressure is 18 L eye, 17 R eye. No obvious pseudo dendrites on fluorescein stain. Will dc home with valtrex, prednisone for possible shingles of the L face.   Final Clinical Impressions(s) / ED Diagnoses   Final diagnoses:  None    ED Discharge Orders    None       Drenda Freeze, MD 04/16/18 1143

## 2018-04-16 NOTE — Discharge Instructions (Signed)
Take prednisone as prescribed.   Take valtrex three times daily for a week for possible shingles rash.   See your eye doctor and primary care doctor  Return to ER if you have worse eye pain, purulent discharge from the eye, worse facial rash, trouble speaking

## 2018-04-16 NOTE — ED Triage Notes (Signed)
Patient reports that she has been having having blisters on her face x 2-3 months. Patient also reports that she now has blisters in her left eye. Patient reports a history of shingles 2013.

## 2018-04-17 ENCOUNTER — Other Ambulatory Visit: Payer: Self-pay

## 2018-04-17 ENCOUNTER — Ambulatory Visit: Payer: Medicare Other | Admitting: Endocrinology

## 2018-04-17 ENCOUNTER — Encounter: Payer: Self-pay | Admitting: Endocrinology

## 2018-04-17 VITALS — BP 120/72 | HR 92 | Ht 66.0 in | Wt 206.0 lb

## 2018-04-17 DIAGNOSIS — E119 Type 2 diabetes mellitus without complications: Secondary | ICD-10-CM | POA: Diagnosis not present

## 2018-04-17 LAB — POCT GLYCOSYLATED HEMOGLOBIN (HGB A1C): Hemoglobin A1C: 5.8 % — AB (ref 4.0–5.6)

## 2018-04-17 MED ORDER — GLIPIZIDE ER 2.5 MG PO TB24: 2.5000 mg | ORAL_TABLET | Freq: Every day | ORAL | 11 refills | Status: DC

## 2018-04-17 NOTE — Progress Notes (Signed)
Subjective:    Patient ID: Melissa [name], female    DOB: 06-05-1951, 67 y.o.   MRN: [id number]  HPI pt is referred by Brien Few, NP, for diabetes.  Pt states DM was dx'ed in 2012; she has mild neuropathy of the lower extremities; she is unaware of any associated chronic complications; she has never been on insulin; pt says her diet and exercise are not good; she has never had GDM (G0), pancreatitis, pancreatic surgery, severe hypoglycemia or DKA.  She has frequent mild hypoglycemia (60).  She did not tolerate metformin (nausea).   Past Medical History:  Diagnosis Date  . Allergy   . Anxiety   . Arthritis    s/p R TKR  . Chest pain on exertion   . Cyst of left kidney 2015   by lumbar MRI pending renal US  . DDD (degenerative disc disease), lumbar 03/2014   severe L2-3 with L HNP with L2/3 nerve root impingement [name])  . Depression   . Depression with anxiety   . Diabetes type 2, controlled ([place]) 2011   borderline  . Dyspnea   . Glaucoma   . History of ulcer disease   . HLD (hyperlipidemia)    no meds taken  . Hypertension   . PONV (postoperative nausea and vomiting)   . Ulcer     Past Surgical History:  Procedure Laterality Date  . ABDOMINAL HYSTERECTOMY  2007   fibroids, heavy bleeding  . CATARACT EXTRACTION W/ INTRAOCULAR LENS IMPLANT Bilateral   . COLONOSCOPY  04/2017   TA, diverticulosis, rpt ? ([facility])  . GLAUCOMA SURGERY    . HAMMER TOE SURGERY Bilateral   . Right hip replacement    . TONSILLECTOMY    . TOTAL KNEE ARTHROPLASTY Right 2004   TKR (Dr. Eddie Dibbles with [facility])  . TOTAL KNEE REVISION Right 03/2014   Dr Al Corpus [facility]    Social History   Socioeconomic History  . Marital status: Married    Spouse name: Not on file  . Number of children: 0  . Years of education: 67  . Highest education level: Not on file  Occupational History  . Occupation: work with disabilities    Employer: [employer]  . Financial resource strain:  Not on file  . Food insecurity:    Worry: Not on file    Inability: Not on file  . Transportation needs:    Medical: Not on file    Non-medical: Not on file  Tobacco Use  . Smoking status: Never Smoker  . Smokeless tobacco: Never Used  Substance and Sexual Activity  . Alcohol use: No  . Drug use: No  . Sexual activity: Not Currently    Birth control/protection: Surgical  Lifestyle  . Physical activity:    Days per week: Not on file    Minutes per session: Not on file  . Stress: Not on file  Relationships  . Social connections:    Talks on phone: Not on file    Gets together: Not on file    Attends religious service: Not on file    Active member of club or organization: Not on file    Attends meetings of clubs or organizations: Not on file    Relationship status: Not on file  . Intimate partner violence:    Fear of current or ex partner: Not on file    Emotionally abused: Not on file    Physically abused: Not on file  Forced sexual activity: Not on file  Other Topics Concern  . Not on file  Social History Narrative   Caffeine: rare   Lives alone   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[employer]   Fun/Hobbies: Gardening     Current Outpatient Medications on File Prior to Visit  Medication Sig Dispense Refill  . ACCU-CHEK GUIDE test strip USE AS DIRECTED TO TEST BLOOD GLUCOSE UP TO FOUR TIMES DAILY 100 each 3  . ALPRAZolam (XANAX) 0.25 MG tablet Take 1 tablet (0.25 mg total) by mouth at bedtime as needed. for sleep 30 tablet 0  . aspirin EC 81 MG tablet Take 81 mg by mouth daily.    . blood glucose meter kit and supplies KIT Dispense based on patient and insurance preference. Use up to four times daily as directed. DX Code: E11.9 1 each 0  . IBUPROFEN PO Take by mouth as needed.    [name] ipratropium (ATROVENT) 0.03 % nasal spray USE 2 SPRAYS IN EACH NOSTRIL TWICE DAILY 30 mL 0  . lisinopril-hydrochlorothiazide (PRINZIDE,ZESTORETIC) 20-12.5 MG tablet TAKE 1 TABLET  BY MOUTH DAILY 90 tablet 0  . pravastatin (PRAVACHOL) 40 MG tablet TAKE 1 TABLET(40 MG) BY MOUTH DAILY 90 tablet 0  . predniSONE (DELTASONE) 20 MG tablet Take 3 tablets (60 mg total) by mouth daily for 2 days, THEN 2 tablets (40 mg total) daily for 2 days, THEN 1 tablet (20 mg total) daily for 2 days. 12 tablet 0  . valACYclovir (VALTREX) 1000 MG tablet Take 1 tablet (1,000 mg total) by mouth 3 (three) times daily. 21 tablet 0  . lisinopril-hydrochlorothiazide (PRINZIDE,ZESTORETIC) 20-12.5 MG tablet TAKE 1 TABLET BY MOUTH EVERY MORNING 90 tablet 0   Current Facility-Administered Medications on File Prior to Visit  Medication Dose Route Frequency Provider Last Rate Last Dose  . 0.9 %  sodium chloride infusion  500 mL Intravenous Continuous Ladene Artist, MD        Allergies  Allergen Reactions  . Codeine Other (See Comments)    palpitations  . Metformin And Related Nausea Only  . Oxycodone Nausea And Vomiting    Family History  Problem Relation Age of Onset  . Cancer Maternal Grandmother 45       ovarian  . Diabetes Maternal Grandmother   . Hypertension Maternal Grandmother   . Cancer Mother 25       bone, MM  . Stroke Other        unsure who  . CAD Neg Hx   . Colon cancer Neg Hx   . Esophageal cancer Neg Hx   . Rectal cancer Neg Hx   . Stomach cancer Neg Hx     BP 120/72 (BP Location: Right Arm, Patient Position: Sitting, Cuff Size: Normal)   Pulse 92   Ht _0  (1.676 m)   Wt 206 lb (93.4 kg)   SpO2 98%   BMI 33.25 kg/m    Review of Systems denies blurry vision, headache, chest pain, sob, n/v, urinary frequency, muscle cramps, excessive diaphoresis, memory loss, cold intolerance, rhinorrhea, and easy bruising. She has lost a few lbs. No change in intermitt depression.      Objective:   Physical Exam VS: see vs page GEN: no distress HEAD: head: no deformity eyes: no periorbital swelling; slight bilat proptosis external nose and ears are normal mouth: no  lesion seen NECK: supple, thyroid is not enlarged CHEST WALL: no deformity LUNGS: clear to auscultation CV: reg rate and rhythm, no  murmur ABD: abdomen is soft, nontender.  no hepatosplenomegaly.  not distended.  no hernia MUSCULOSKELETAL: muscle bulk and strength are grossly normal, but unable to assess LUE.  no obvious joint swelling.  gait is normal and steady.  Left arm is in a sling EXTEMITIES: no deformity, except for mild hammer toes.  no ulcer on the feet.  feet are of normal color and temp.  no leg edema.  There is bilateral onychomycosis of the toenails PULSES: dorsalis pedis intact bilat.  no carotid bruit NEURO:  cn 2-12 grossly intact.   readily moves all 4's.  sensation is intact to touch on the feet SKIN:  Normal texture and temperature.  No rash or suspicious lesion is visible.   NODES:  None palpable at the neck.   PSYCH: alert, well-oriented.  Does not appear anxious nor depressed.    Lab Results  Component Value Date   HGBA1C 5.8 (A) 04/17/2018    Lab Results  Component Value Date   CREATININE 0.72 02/25/2018   BUN 16 02/25/2018   NA 138 02/25/2018   K 3.6 02/25/2018   CL 102 02/25/2018   CO2 27 02/25/2018   I have reviewed outside records, and summarized: Pt was noted to have DM, and referred here.  Other probs addressed were HTN and salivary stone.      Assessment & Plan:  Type 2 DM, with polyneuropathy: new to me: overcontrolled, for this SU-containing regimen.  We discussed rx options.  She declines metformin-XR.  She declines name brand meds  Patient Instructions  good diet and exercise significantly improve the control of your diabetes.  please let me know if you wish to be referred to a dietician.  high blood sugar is very risky to your health.  you should see an eye doctor and dentist every year.  It is very important to get all recommended vaccinations.  Controlling your blood pressure and cholesterol drastically reduces the damage diabetes does to  your body.  Those who smoke should quit.  Please discuss these with your doctor.  check your blood sugar once a day.  vary the time of day when you check, between before the 3 meals, and at bedtime.  also check if you have symptoms of your blood sugar being too high or too low.  please keep a record of the readings and bring it to your next appointment here (or you can bring the meter itself).  You can write it on any piece of paper.  please call us sooner if your blood sugar goes below 70, or if you have a lot of readings over 200.  Please reduce the glipizide to 2.3 mg each morning. Please come back for a follow-up appointment in 2 months.

## 2018-04-17 NOTE — Patient Instructions (Addendum)
good diet and exercise significantly improve the control of your diabetes.  please let me know if you wish to be referred to a dietician.  high blood sugar is very risky to your health.  you should see an eye doctor and dentist every year.  It is very important to get all recommended vaccinations.  Controlling your blood pressure and cholesterol drastically reduces the damage diabetes does to your body.  Those who smoke should quit.  Please discuss these with your doctor.  check your blood sugar once a day.  vary the time of day when you check, between before the 3 meals, and at bedtime.  also check if you have symptoms of your blood sugar being too high or too low.  please keep a record of the readings and bring it to your next appointment here (or you can bring the meter itself).  You can write it on any piece of paper.  please call us sooner if your blood sugar goes below 70, or if you have a lot of readings over 200.  Please reduce the glipizide to 2.3 mg each morning. Please come back for a follow-up appointment in 2 months.

## 2018-04-18 ENCOUNTER — Other Ambulatory Visit: Payer: Self-pay | Admitting: Nurse Practitioner

## 2018-04-18 DIAGNOSIS — Z1231 Encounter for screening mammogram for malignant neoplasm of breast: Secondary | ICD-10-CM

## 2018-04-29 ENCOUNTER — Other Ambulatory Visit: Payer: Self-pay | Admitting: Nurse Practitioner

## 2018-04-29 NOTE — Telephone Encounter (Signed)
Pls advise if ok to refill../lmb 

## 2018-04-29 NOTE — Telephone Encounter (Signed)
Copied from [facility] [id number]. Topic: Quick Communication - Rx Refill/Question >> Apr 29, 2018  8:44 AM Sheran Luz wrote: Medication: valACYclovir (VALTREX) 1000 MG tablet   Pt called stating that she needs a refill of this medication. Pt was advised by the prescribing ER Dr to call her PCP to get a refill. Please advise.    Preferred Pharmacy (with phone number or street name): [facility] [place], [place] [id number] (Phone) [phone] (Fax)

## 2018-04-29 NOTE — Telephone Encounter (Signed)
Rx refill request: valacyclovir 1000 mg   Outside provider   Last filled: 04/16/18  LOV: 02/20/18  PCP: [place]: verified

## 2018-05-01 ENCOUNTER — Encounter (HOSPITAL_COMMUNITY): Payer: Self-pay

## 2018-05-01 ENCOUNTER — Ambulatory Visit (HOSPITAL_COMMUNITY)
Admission: EM | Admit: 2018-05-01 | Discharge: 2018-05-01 | Disposition: A | Discharge status: Home / Self Care | Payer: Medicare Other | Attending: Family Medicine | Admitting: Family Medicine

## 2018-05-01 ENCOUNTER — Other Ambulatory Visit: Payer: Self-pay

## 2018-05-01 DIAGNOSIS — R21 Rash and other nonspecific skin eruption: Secondary | ICD-10-CM | POA: Diagnosis not present

## 2018-05-01 MED ORDER — PREDNISOLONE ACETATE 0.12 % OP SUSP: 1.0000 [drp] | Freq: Four times a day (QID) | OPHTHALMIC | 0 refills | Status: DC

## 2018-05-01 MED ORDER — DORZOLAMIDE HCL-TIMOLOL MAL 2-0.5 % OP SOLN: 1.0000 [drp] | Freq: Two times a day (BID) | OPHTHALMIC | 0 refills | Status: DC

## 2018-05-01 MED ORDER — PREDNISONE 10 MG (21) PO TBPK: ORAL_TABLET | ORAL | 0 refills | Status: DC

## 2018-05-01 NOTE — ED Triage Notes (Signed)
Pt states she has shingles in her eyes and face.

## 2018-05-01 NOTE — Discharge Instructions (Addendum)
It was nice meeting you!!  I am not sure that this is a re-outbreak of shingles Typically the rash does not recur this soon. This is more typical of some kind of contact dermatitis. You may want to monitor any new foods, lotions, plants that could be causing a reaction.

## 2018-05-02 MED ORDER — VALACYCLOVIR HCL 1 G PO TABS: 1000.0000 mg | ORAL_TABLET | Freq: Three times a day (TID) | ORAL | 0 refills | Status: DC

## 2018-05-02 NOTE — Telephone Encounter (Signed)
I have sent a valtrex refill for her, however if she continues to experience symptoms after this course of valtrex please have her schedule a follow up visit for further evaluation

## 2018-05-03 ENCOUNTER — Encounter (HOSPITAL_COMMUNITY): Payer: Self-pay | Admitting: Family Medicine
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
## 2018-05-08 ENCOUNTER — Other Ambulatory Visit: Payer: Self-pay | Admitting: Nurse Practitioner

## 2018-05-08 DIAGNOSIS — F329 Major depressive disorder, single episode, unspecified: Secondary | ICD-10-CM

## 2018-05-08 DIAGNOSIS — F32A Depression, unspecified: Secondary | ICD-10-CM

## 2018-05-08 DIAGNOSIS — F419 Anxiety disorder, unspecified: Secondary | ICD-10-CM

## 2018-05-08 NOTE — Telephone Encounter (Signed)
[place] Controlled Database Checked Last filled: 03/27/18 LOV w/PCP: 02/20/18 Next appt: None  Ok to Rf in PCP's absence? Thanks!

## 2018-05-08 NOTE — Telephone Encounter (Addendum)
Pt informed of below. She will call us back to schedule OV.

## 2018-05-08 NOTE — Telephone Encounter (Signed)
I will fill 2 week and advise that she make an appointment with Caryl Pina in that time frame as she is overdue for recheck with BP med change and should be seen.

## 2018-05-13 ENCOUNTER — Ambulatory Visit: Payer: Medicare Other

## 2018-05-13 DIAGNOSIS — S46012D Strain of muscle(s) and tendon(s) of the rotator cuff of left shoulder, subsequent encounter: Secondary | ICD-10-CM | POA: Diagnosis not present

## 2018-05-15 ENCOUNTER — Ambulatory Visit (INDEPENDENT_AMBULATORY_CARE_PROVIDER_SITE_OTHER): Payer: Medicare Other | Admitting: *Deleted

## 2018-05-15 VITALS — BP 132/74 | HR 70 | Resp 18 | Ht 66.0 in | Wt 208.0 lb

## 2018-05-15 DIAGNOSIS — E2839 Other primary ovarian failure: Secondary | ICD-10-CM | POA: Diagnosis not present

## 2018-05-15 DIAGNOSIS — Z23 Encounter for immunization: Secondary | ICD-10-CM | POA: Diagnosis not present

## 2018-05-15 DIAGNOSIS — Z0000 Encounter for general adult medical examination without abnormal findings: Secondary | ICD-10-CM | POA: Diagnosis not present

## 2018-05-15 NOTE — Patient Instructions (Addendum)
Please ask Dr. Sherral Hammers to send eye exam results to PCP. Fax: [phone]  If you or someone you know has experienced the death of a loved one, the support of others can play an invaluable role in the healing process. [facility] offers grief and loss services for anyone in the community. Contact us today [phone] Health Maintenance, Female Adopting a healthy lifestyle and getting preventive care can go a long way to promote health and wellness. Talk with your health care provider about what schedule of regular examinations is right for you. This is a good chance for you to check in with your provider about disease prevention and staying healthy. In between checkups, there are plenty of things you can do on your own. Experts have done a lot of research about which lifestyle changes and preventive measures are most likely to keep you healthy. Ask your health care provider for more information. Weight and diet Eat a healthy diet  Be sure to include plenty of vegetables, fruits, low-fat dairy products, and lean protein.  Do not eat a lot of foods high in solid fats, added sugars, or salt.  Get regular exercise. This is one of the most important things you can do for your health. ? Most adults should exercise for at least 150 minutes each week. The exercise should increase your heart rate and make you sweat (moderate-intensity exercise). ? Most adults should also do strengthening exercises at least twice a week. This is in addition to the moderate-intensity exercise.  Maintain a healthy weight  Body mass index (BMI) is a measurement that can be used to identify possible weight problems. It estimates body fat based on height and weight. Your health care provider can help determine your BMI and help you achieve or maintain a healthy weight.  For females 35 years of age and older: ? A BMI below 18.5 is considered underweight. ? A BMI of 18.5 to 24.9 is normal. ? A BMI of 25  to 29.9 is considered overweight. ? A BMI of 30 and above is considered obese.  Watch levels of cholesterol and blood lipids  You should start having your blood tested for lipids and cholesterol at 67 years of age, then have this test every 5 years.  You may need to have your cholesterol levels checked more often if: ? Your lipid or cholesterol levels are high. ? You are older than 67 years of age. ? You are at high risk for heart disease.  Cancer screening Lung Cancer  Lung cancer screening is recommended for adults 70-2 years old who are at high risk for lung cancer because of a history of smoking.  A yearly low-dose CT scan of the lungs is recommended for people who: ? Currently smoke. ? Have quit within the past 15 years. ? Have at least a 30-pack-year history of smoking. A pack year is smoking an average of one pack of cigarettes a day for 1 year.  Yearly screening should continue until it has been 15 years since you quit.  Yearly screening should stop if you develop a health problem that would prevent you from having lung cancer treatment.  Breast Cancer  Practice breast self-awareness. This means understanding how your breasts normally appear and feel.  It also means doing regular breast self-exams. Let your health care provider know about any changes, no matter how small.  If you are in your 20s or 30s, you should have a clinical breast exam (CBE) by a  health care provider every 1-3 years as part of a regular health exam.  If you are 63 or older, have a CBE every year. Also consider having a breast X-ray (mammogram) every year.  If you have a family history of breast cancer, talk to your health care provider about genetic screening.  If you are at high risk for breast cancer, talk to your health care provider about having an MRI and a mammogram every year.  Breast cancer gene (BRCA) assessment is recommended for women who have family members with BRCA-related cancers.  BRCA-related cancers include: ? Breast. ? Ovarian. ? Tubal. ? Peritoneal cancers.  Results of the assessment will determine the need for genetic counseling and BRCA1 and BRCA2 testing.  Cervical Cancer Your health care provider may recommend that you be screened regularly for cancer of the pelvic organs (ovaries, uterus, and vagina). This screening involves a pelvic examination, including checking for microscopic changes to the surface of your cervix (Pap test). You may be encouraged to have this screening done every 3 years, beginning at age 12.  For women ages 41-65, health care providers may recommend pelvic exams and Pap testing every 3 years, or they may recommend the Pap and pelvic exam, combined with testing for human papilloma virus (HPV), every 5 years. Some types of HPV increase your risk of cervical cancer. Testing for HPV may also be done on women of any age with unclear Pap test results.  Other health care providers may not recommend any screening for nonpregnant women who are considered low risk for pelvic cancer and who do not have symptoms. Ask your health care provider if a screening pelvic exam is right for you.  If you have had past treatment for cervical cancer or a condition that could lead to cancer, you need Pap tests and screening for cancer for at least 20 years after your treatment. If Pap tests have been discontinued, your risk factors (such as having a new sexual partner) need to be reassessed to determine if screening should resume. Some women have medical problems that increase the chance of getting cervical cancer. In these cases, your health care provider may recommend more frequent screening and Pap tests.  Colorectal Cancer  This type of cancer can be detected and often prevented.  Routine colorectal cancer screening usually begins at 67 years of age and continues through 66 years of age.  Your health care provider may recommend screening at an earlier age if  you have risk factors for colon cancer.  Your health care provider may also recommend using home test kits to check for hidden blood in the stool.  A small camera at the end of a tube can be used to examine your colon directly (sigmoidoscopy or colonoscopy). This is done to check for the earliest forms of colorectal cancer.  Routine screening usually begins at age 20.  Direct examination of the colon should be repeated every 5-10 years through 67 years of age. However, you may need to be screened more often if early forms of precancerous polyps or small growths are found.  Skin Cancer  Check your skin from head to toe regularly.  Tell your health care provider about any new moles or changes in moles, especially if there is a change in a mole's shape or color.  Also tell your health care provider if you have a mole that is larger than the size of a pencil eraser.  Always use sunscreen. Apply sunscreen liberally and repeatedly throughout  the day.  Protect yourself by wearing long sleeves, pants, a wide-brimmed hat, and sunglasses whenever you are outside.  Heart disease, diabetes, and high blood pressure  High blood pressure causes heart disease and increases the risk of stroke. High blood pressure is more likely to develop in: ? People who have blood pressure in the high end of the normal range (130-139/85-89 mm Hg). ? People who are overweight or obese. ? People who are African American.  If you are 90-31 years of age, have your blood pressure checked every 3-5 years. If you are 79 years of age or older, have your blood pressure checked every year. You should have your blood pressure measured twice-once when you are at a hospital or clinic, and once when you are not at a hospital or clinic. Record the average of the two measurements. To check your blood pressure when you are not at a hospital or clinic, you can use: ? An automated blood pressure machine at a pharmacy. ? A home blood  pressure monitor.  If you are between 61 years and 42 years old, ask your health care provider if you should take aspirin to prevent strokes.  Have regular diabetes screenings. This involves taking a blood sample to check your fasting blood sugar level. ? If you are at a normal weight and have a low risk for diabetes, have this test once every three years after 67 years of age. ? If you are overweight and have a high risk for diabetes, consider being tested at a younger age or more often. Preventing infection Hepatitis B  If you have a higher risk for hepatitis B, you should be screened for this virus. You are considered at high risk for hepatitis B if: ? You were born in a country where hepatitis B is common. Ask your health care provider which countries are considered high risk. ? Your parents were born in a high-risk country, and you have not been immunized against hepatitis B (hepatitis B vaccine). ? You have HIV or AIDS. ? You use needles to inject street drugs. ? You live with someone who has hepatitis B. ? You have had sex with someone who has hepatitis B. ? You get hemodialysis treatment. ? You take certain medicines for conditions, including cancer, organ transplantation, and autoimmune conditions.  Hepatitis C  Blood testing is recommended for: ? Everyone born from [birth year] through 1965. ? Anyone with known risk factors for hepatitis C.  Sexually transmitted infections (STIs)  You should be screened for sexually transmitted infections (STIs) including gonorrhea and chlamydia if: ? You are sexually active and are younger than 67 years of age. ? You are older than 67 years of age and your health care provider tells you that you are at risk for this type of infection. ? Your sexual activity has changed since you were last screened and you are at an increased risk for chlamydia or gonorrhea. Ask your health care provider if you are at risk.  If you do not have HIV, but are at risk,  it may be recommended that you take a prescription medicine daily to prevent HIV infection. This is called pre-exposure prophylaxis (PrEP). You are considered at risk if: ? You are sexually active and do not regularly use condoms or know the HIV status of your partner(s). ? You take drugs by injection. ? You are sexually active with a partner who has HIV.  Talk with your health care provider about whether you are at  high risk of being infected with HIV. If you choose to begin PrEP, you should first be tested for HIV. You should then be tested every 3 months for as long as you are taking PrEP. Pregnancy  If you are premenopausal and you may become pregnant, ask your health care provider about preconception counseling.  If you may become pregnant, take 400 to 800 micrograms (mcg) of folic acid every day.  If you want to prevent pregnancy, talk to your health care provider about birth control (contraception). Osteoporosis and menopause  Osteoporosis is a disease in which the bones lose minerals and strength with aging. This can result in serious bone fractures. Your risk for osteoporosis can be identified using a bone density scan.  If you are 43 years of age or older, or if you are at risk for osteoporosis and fractures, ask your health care provider if you should be screened.  Ask your health care provider whether you should take a calcium or vitamin D supplement to lower your risk for osteoporosis.  Menopause may have certain physical symptoms and risks.  Hormone replacement therapy may reduce some of these symptoms and risks. Talk to your health care provider about whether hormone replacement therapy is right for you. Follow these instructions at home:  Schedule regular health, dental, and eye exams.  Stay current with your immunizations.  Do not use any tobacco products including cigarettes, chewing tobacco, or electronic cigarettes.  If you are pregnant, do not drink  alcohol.  If you are breastfeeding, limit how much and how often you drink alcohol.  Limit alcohol intake to no more than 1 drink per day for nonpregnant women. One drink equals 12 ounces of beer, 5 ounces of wine, or 1 ounces of hard liquor.  Do not use street drugs.  Do not share needles.  Ask your health care provider for help if you need support or information about quitting drugs.  Tell your health care provider if you often feel depressed.  Tell your health care provider if you have ever been abused or do not feel safe at home. This information is not intended to replace advice given to you by your health care provider. Make sure you discuss any questions you have with your health care provider. Document Released: 03/06/2011 Document Revised: 01/27/2016 Document Reviewed: 05/25/2015 Elsevier Interactive Patient Education  2018 [employer].  Influenza Virus Vaccine (Flucelvax) What is this medicine? INFLUENZA VIRUS VACCINE (in floo EN zuh VAHY ruhs vak SEEN) helps to reduce the risk of getting influenza also known as the flu. The vaccine only helps protect you against some strains of the flu. This medicine may be used for other purposes; ask your health care provider or pharmacist if you have questions. COMMON BRAND NAME(S): FLUCELVAX What should I tell my health care provider before I take this medicine? They need to know if you have any of these conditions: -bleeding disorder like hemophilia -fever or infection -Guillain-Barre syndrome or other neurological problems -immune system problems -infection with the human immunodeficiency virus (HIV) or AIDS -low blood platelet counts -multiple sclerosis -an unusual or allergic reaction to influenza virus vaccine, other medicines, foods, dyes or preservatives -pregnant or trying to get pregnant -breast-feeding How should I use this medicine? This vaccine is for injection into a muscle. It is given by a health care  professional. A copy of Vaccine Information Statements will be given before each vaccination. Read this sheet carefully each time. The sheet may change frequently. Talk to  your pediatrician regarding the use of this medicine in children. Special care may be needed. Overdosage: If you think you've taken too much of this medicine contact a poison control center or emergency room at once. Overdosage: If you think you have taken too much of this medicine contact a poison control center or emergency room at once. NOTE: This medicine is only for you. Do not share this medicine with others. What if I miss a dose? This does not apply. What may interact with this medicine? -chemotherapy or radiation therapy -medicines that lower your immune system like etanercept, anakinra, infliximab, and adalimumab -medicines that treat or prevent blood clots like warfarin -phenytoin -steroid medicines like prednisone or cortisone -theophylline -vaccines This list may not describe all possible interactions. Give your health care provider a list of all the medicines, herbs, non-prescription drugs, or dietary supplements you use. Also tell them if you smoke, drink alcohol, or use illegal drugs. Some items may interact with your medicine. What should I watch for while using this medicine? Report any side effects that do not go away within 3 days to your doctor or health care professional. Call your health care provider if any unusual symptoms occur within 6 weeks of receiving this vaccine. You may still catch the flu, but the illness is not usually as bad. You cannot get the flu from the vaccine. The vaccine will not protect against colds or other illnesses that may cause fever. The vaccine is needed every year. What side effects may I notice from receiving this medicine? Side effects that you should report to your doctor or health care professional as soon as possible: -allergic reactions like skin rash, itching or  hives, swelling of the face, lips, or tongue Side effects that usually do not require medical attention (Report these to your doctor or health care professional if they continue or are bothersome.): -fever -headache -muscle aches and pains -pain, tenderness, redness, or swelling at the injection site -tiredness This list may not describe all possible side effects. Call your doctor for medical advice about side effects. You may report side effects to FDA at 1-800-FDA-1088. Where should I keep my medicine? The vaccine will be given by a health care professional in a clinic, pharmacy, doctor's office, or other health care setting. You will not be given vaccine doses to store at home. NOTE: This sheet is a summary. It may not cover all possible information. If you have questions about this medicine, talk to your doctor, pharmacist, or health care provider.  2018 Elsevier/Gold Standard (2011-08-02 14:06:47)

## 2018-05-15 NOTE — Progress Notes (Signed)
Medical screening examination/treatment/procedure(s) were performed by the Wellness Coach, RN. As primary care provider I was immediately available for consulation/collaboration. I agree with above documentation. Lavonne Cass, NP  

## 2018-05-15 NOTE — Progress Notes (Signed)
Subjective:   Melissa [name] is a 67 y.o. female who presents for an Initial Medicare Annual Wellness Visit.  Review of Systems    No ROS.  Medicare Wellness Visit. Additional risk factors are reflected in the social history. Cardiac Risk Factors include: advanced age (>68mn, >>20women);diabetes mellitus;dyslipidemia;hypertension Sleep patterns: feels rested on waking, gets up 1 times nightly to void and sleeps 7-8 hours nightly.    Home Safety/Smoke Alarms: Feels safe in home. Smoke alarms in place.  Living environment; residence and Firearm Safety: 1-story house/ trailer, no firearms. Lives alone, no needs for DME, limited support system Seat Belt Safety/Bike Helmet: Wears seat belt.      Objective:    Today's Vitals   05/15/18 0927 05/15/18 0933  BP: 132/74   Pulse: 70   Resp: 18   SpO2: 99%   Weight: 208 lb (94.3 kg)   Height: 5' 6" (1.676 m)   PainSc:  2    Body mass index is 33.57 kg/m.  Advanced Directives 05/15/2018 04/16/2018 04/10/2017 03/16/2017 09/25/2016 06/17/2016 06/15/2016  Does Patient Have a Medical Advance Directive? _0  No No  Would patient like information on creating a medical advance directive? Yes (ED - Information included in AVS) Yes (ED - Information included in AVS) - - - Yes - Educational materials given No - patient declined information  Pre-existing out of facility DNR order (yellow form or pink MOST form) - - - - - - -    Current Medications (verified) Outpatient Encounter Medications as of 05/15/2018  Medication Sig  . ACCU-CHEK GUIDE test strip USE AS DIRECTED TO TEST BLOOD GLUCOSE UP TO FOUR TIMES DAILY  . ALPRAZolam (XANAX) 0.25 MG tablet TAKE 1 TABLET(0.25 MG) BY MOUTH AT BEDTIME AS NEEDED FOR SLEEP  . aspirin EC 81 MG tablet Take 81 mg by mouth daily.  . blood glucose meter kit and supplies KIT Dispense based on patient and insurance preference. Use up to four times daily as directed. DX Code: E11.9  . dorzolamide-timolol  (COSOPT) 22.3-6.8 MG/ML ophthalmic solution Place 1 drop into both eyes 2 (two) times daily.  .Marland KitchenglipiZIDE (GLUCOTROL XL) 2.5 MG 24 hr tablet Take 1 tablet (2.5 mg total) by mouth daily with breakfast.  . IBUPROFEN PO Take by mouth as needed.  .Marland Kitchenlisinopril-hydrochlorothiazide (PRINZIDE,ZESTORETIC) 20-12.5 MG tablet TAKE 1 TABLET BY MOUTH EVERY MORNING  . pravastatin (PRAVACHOL) 40 MG tablet TAKE 1 TABLET(40 MG) BY MOUTH DAILY  . prednisoLONE acetate (PRED MILD) 0.12 % ophthalmic suspension Place 1 drop into both eyes 4 (four) times daily.  . [DISCONTINUED] ipratropium (ATROVENT) 0.03 % nasal spray USE 2 SPRAYS IN EACH NOSTRIL TWICE DAILY (Patient not taking: Reported on 05/15/2018)  . [DISCONTINUED] lisinopril-hydrochlorothiazide (PRINZIDE,ZESTORETIC) 20-12.5 MG tablet TAKE 1 TABLET BY MOUTH DAILY (Patient not taking: Reported on 05/15/2018)  . [DISCONTINUED] predniSONE (STERAPRED UNI-PAK 21 TAB) 10 MG (21) TBPK tablet 6 tabs for 1 day, then 5 tabs for 1 das, then 4 tabs for 1 day, then 3 tabs for 1 day, 2 tabs for 1 day, then 1 tab for 1 day (Patient not taking: Reported on 05/15/2018)  . [DISCONTINUED] valACYclovir (VALTREX) 1000 MG tablet Take 1 tablet (1,000 mg total) by mouth 3 (three) times daily. (Patient not taking: Reported on 05/15/2018)   No facility-administered encounter medications on file as of 05/15/2018.     Allergies (verified) Codeine; Metformin and related; and Oxycodone   History: Past Medical History:  Diagnosis Date  .  Allergy   . Anxiety   . Arthritis    s/p R TKR  . Chest pain on exertion   . Cyst of left kidney 2015   by lumbar MRI pending renal US  . DDD (degenerative disc disease), lumbar 03/2014   severe L2-3 with L HNP with L2/3 nerve root impingement [name])  . Depression   . Depression with anxiety   . Diabetes type 2, controlled ([place]) 2011   borderline  . Dyspnea   . Glaucoma   . History of ulcer disease   . HLD (hyperlipidemia)    no meds taken  .  Hypertension   . PONV (postoperative nausea and vomiting)   . Ulcer    Past Surgical History:  Procedure Laterality Date  . ABDOMINAL HYSTERECTOMY  2007   fibroids, heavy bleeding  . CATARACT EXTRACTION W/ INTRAOCULAR LENS IMPLANT Bilateral   . COLONOSCOPY  04/2017   TA, diverticulosis, rpt ? ([facility])  . GLAUCOMA SURGERY    . HAMMER TOE SURGERY Bilateral   . Right hip replacement    . TONSILLECTOMY    . TOTAL KNEE ARTHROPLASTY Right 2004   TKR (Dr. Eddie Dibbles with [facility])  . TOTAL KNEE REVISION Right 03/2014   Dr Al Corpus [facility]   Family History  Problem Relation Age of Onset  . Cancer Maternal Grandmother 14       ovarian  . Diabetes Maternal Grandmother   . Hypertension Maternal Grandmother   . Cancer Mother 20       bone, MM  . Stroke Other        unsure who  . CAD Neg Hx   . Colon cancer Neg Hx   . Esophageal cancer Neg Hx   . Rectal cancer Neg Hx   . Stomach cancer Neg Hx    Social History   Socioeconomic History  . Marital status: Divorced    Spouse name: Not on file  . Number of children: 0  . Years of education: 15  . Highest education level: Not on file  Occupational History  . Occupation: work with disabilities    Employer: [employer]  . Financial resource strain: Not hard at all  . Food insecurity:    Worry: Never true    Inability: Never true  . Transportation needs:    Medical: No    Non-medical: No  Tobacco Use  . Smoking status: Never Smoker  . Smokeless tobacco: Never Used  Substance and Sexual Activity  . Alcohol use: No  . Drug use: No  . Sexual activity: Not Currently    Birth control/protection: Surgical  Lifestyle  . Physical activity:    Days per week: 0 days    Minutes per session: 0 min  . Stress: Only a little  Relationships  . Social connections:    Talks on phone: More than three times a week    Gets together: More than three times a week    Attends religious service: More than 4 times per year    Active  member of club or organization: Yes    Attends meetings of clubs or organizations: More than 4 times per year    Relationship status: Divorced  Other Topics Concern  . Not on file  Social History Narrative   Caffeine: rare   Lives alone   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[employer]   Fun/Hobbies: Gardening     Tobacco Counseling Counseling given: Not Answered  Clinical Intake: Pain : 0-10 Pain Score: 2  Pain Type: Chronic pain Pain Location: Shoulder Pain Orientation: Left  Activities of Daily Living In your present state of health, do you have any difficulty performing the following activities: 05/15/2018  Hearing? N  Vision? N  Difficulty concentrating or making decisions? N  Walking or climbing stairs? N  Dressing or bathing? N  Doing errands, shopping? N  Preparing Food and eating ? N  Using the Toilet? N  In the past six months, have you accidently leaked urine? N  Do you have problems with loss of bowel control? N  Managing your Medications? N  Managing your Finances? N  Housekeeping or managing your Housekeeping? N  Some recent data might be hidden     Immunizations and Health Maintenance Immunization History  Administered Date(s) Administered  . Influenza, High Dose Seasonal PF 05/30/2017  . Influenza,inj,Quad PF,6+ Mos 05/13/2013  . Influenza-Unspecified 06/04/2014  . Pneumococcal Conjugate-13 09/18/2017  . Pneumococcal Polysaccharide-23 08/05/2014  . Tdap 04/16/2011   Health Maintenance Due  Topic Date Due  . Hepatitis C Screening  July 19, 1951  . DEXA SCAN  09/25/2015  . OPHTHALMOLOGY EXAM  02/08/2016  . MAMMOGRAM  03/14/2018  . INFLUENZA VACCINE  04/04/2018    Patient Care Team: Lance Sell, NP as PCP - General (Nurse Practitioner)  Indicate any recent Medical Services you may have received from other than [facility] providers in the past year (date may be approximate).     Assessment:   This is a routine wellness  examination for Melissa [name]. Physical assessment deferred to PCP.   Hearing/Vision screen Hearing Screening Comments: Able to hear conversational tones w/o difficulty. No issues reported.  Passed whisper test Vision Screening Comments: appointment every 6 months Dr. Sherral Hammers. Patient has an upcoming appointment, she will request they send exam report to PCP  Dietary issues and exercise activities discussed: Current Exercise Habits: The patient does not participate in regular exercise at present, Exercise limited by: orthopedic condition(s)  Diet (meal preparation, eat out, water intake, caffeinated beverages, dairy products, fruits and vegetables): in general, a "healthy" diet  , well balanced   Reviewed heart healthy and diabetic diet. Encouraged patient to increase daily water and healthy fluid intake.  Goals    . Patient Stated     Continue to garden as much as possible. Increase physical activity by continuing to do PT exercises and walk.      Depression Screen PHQ 2/9 Scores 05/15/2018 05/30/2017  PHQ - 2 Score 1 0  PHQ- 9 Score 3 -    Fall Risk Fall Risk  05/15/2018 05/30/2017  Falls in the past year? No No   Cognitive Function:       Ad8 score reviewed for issues:  Issues making decisions: no  Less interest in hobbies / activities: no  Repeats questions, stories (family complaining): no  Trouble using ordinary gadgets (microwave, computer, phone):no  Forgets the month or year: no  Mismanaging finances: no  Remembering appts: no  Daily problems with thinking and/or memory: no Ad8 score is= 0  Screening Tests Health Maintenance  Topic Date Due  . Hepatitis C Screening  Jan 05, 1951  . DEXA SCAN  09/25/2015  . OPHTHALMOLOGY EXAM  02/08/2016  . MAMMOGRAM  03/14/2018  . INFLUENZA VACCINE  04/04/2018  . FOOT EXAM  05/30/2018  . HEMOGLOBIN A1C  10/18/2018  . PNA vac Low Risk Adult (2 of 2 - PPSV23) 08/06/2019  . TETANUS/TDAP  04/15/2021  . COLONOSCOPY  04/24/2022      Plan:     Continue doing brain stimulating activities (puzzles, reading, adult coloring books, staying active) to keep memory sharp.   Continue to eat heart healthy diet (full of fruits, vegetables, whole grains, lean protein, water--limit salt, fat, and sugar intake) and increase physical activity as tolerated.  I have personally reviewed and noted the following in the patient's chart:   . Medical and social history . Use of alcohol, tobacco or illicit drugs  . Current medications and supplements . Functional ability and status . Nutritional status . Physical activity . Advanced directives . List of other physicians . Vitals . Screenings to include cognitive, depression, and falls . Referrals and appointments  In addition, I have reviewed and discussed with patient certain preventive protocols, quality metrics, and best practice recommendations. A written personalized care plan for preventive services as well as general preventive health recommendations were provided to patient.     Michiel Cowboy, RN   05/15/2018

## 2018-05-17 ENCOUNTER — Ambulatory Visit: Payer: Medicare Other

## 2018-05-17 ENCOUNTER — Ambulatory Visit
Admission: RE | Admit: 2018-05-17 | Discharge: 2018-05-17 | Disposition: A | Discharge status: Home / Self Care | Payer: Medicare Other | Source: Ambulatory Visit | Attending: Nurse Practitioner | Admitting: Nurse Practitioner

## 2018-05-17 DIAGNOSIS — Z1231 Encounter for screening mammogram for malignant neoplasm of breast: Secondary | ICD-10-CM | POA: Diagnosis not present

## 2018-05-24 ENCOUNTER — Ambulatory Visit: Payer: Medicare Other | Admitting: Nurse Practitioner

## 2018-05-24 ENCOUNTER — Inpatient Hospital Stay: Admission: RE | Admit: 2018-05-24 | Payer: Medicare Other | Source: Ambulatory Visit

## 2018-05-28 ENCOUNTER — Other Ambulatory Visit: Payer: Self-pay | Admitting: Nurse Practitioner

## 2018-05-28 DIAGNOSIS — F32A Depression, unspecified: Secondary | ICD-10-CM

## 2018-05-28 DIAGNOSIS — F329 Major depressive disorder, single episode, unspecified: Secondary | ICD-10-CM

## 2018-05-28 DIAGNOSIS — F419 Anxiety disorder, unspecified: Secondary | ICD-10-CM

## 2018-05-28 NOTE — Telephone Encounter (Signed)
[place] Controlled Database Checked Last filled: 05/08/18   # 14 LOV w/you: 02/20/18 Next appt w/you: 06/07/18

## 2018-06-07 ENCOUNTER — Ambulatory Visit: Payer: Medicare Other | Admitting: Nurse Practitioner

## 2018-06-07 DIAGNOSIS — Z0289 Encounter for other administrative examinations: Secondary | ICD-10-CM

## 2018-06-12 DIAGNOSIS — M25512 Pain in left shoulder: Secondary | ICD-10-CM | POA: Diagnosis not present

## 2018-06-20 DIAGNOSIS — M25511 Pain in right shoulder: Secondary | ICD-10-CM | POA: Diagnosis not present

## 2018-06-21 DIAGNOSIS — M2042 Other hammer toe(s) (acquired), left foot: Secondary | ICD-10-CM | POA: Diagnosis not present

## 2018-06-21 DIAGNOSIS — M21611 Bunion of right foot: Secondary | ICD-10-CM | POA: Diagnosis not present

## 2018-06-21 DIAGNOSIS — M21612 Bunion of left foot: Secondary | ICD-10-CM | POA: Diagnosis not present

## 2018-06-21 DIAGNOSIS — E139 Other specified diabetes mellitus without complications: Secondary | ICD-10-CM | POA: Diagnosis not present

## 2018-06-24 DIAGNOSIS — M25512 Pain in left shoulder: Secondary | ICD-10-CM | POA: Diagnosis not present

## 2018-07-01 DIAGNOSIS — M25512 Pain in left shoulder: Secondary | ICD-10-CM | POA: Diagnosis not present

## 2018-07-04 DIAGNOSIS — M25512 Pain in left shoulder: Secondary | ICD-10-CM | POA: Diagnosis not present

## 2018-07-08 ENCOUNTER — Other Ambulatory Visit: Payer: Self-pay | Admitting: Nurse Practitioner

## 2018-07-08 DIAGNOSIS — F329 Major depressive disorder, single episode, unspecified: Secondary | ICD-10-CM

## 2018-07-08 DIAGNOSIS — F32A Depression, unspecified: Secondary | ICD-10-CM

## 2018-07-08 DIAGNOSIS — F419 Anxiety disorder, unspecified: Secondary | ICD-10-CM

## 2018-07-09 NOTE — Telephone Encounter (Signed)
[place] Controlled Database Checked Last filled: 05/29/18 # 30 LOV w/you: 02/20/18 Next appt w/you: 07/18/18

## 2018-07-11 DIAGNOSIS — M25519 Pain in unspecified shoulder: Secondary | ICD-10-CM | POA: Diagnosis not present

## 2018-07-15 DIAGNOSIS — N281 Cyst of kidney, acquired: Secondary | ICD-10-CM | POA: Diagnosis not present

## 2018-07-17 DIAGNOSIS — M545 Low back pain: Secondary | ICD-10-CM | POA: Diagnosis not present

## 2018-07-17 DIAGNOSIS — G8929 Other chronic pain: Secondary | ICD-10-CM | POA: Diagnosis not present

## 2018-07-18 ENCOUNTER — Ambulatory Visit (INDEPENDENT_AMBULATORY_CARE_PROVIDER_SITE_OTHER): Payer: Medicare Other | Admitting: Nurse Practitioner

## 2018-07-18 ENCOUNTER — Encounter: Payer: Self-pay | Admitting: Nurse Practitioner

## 2018-07-18 VITALS — BP 100/70 | HR 72 | Temp 98.0°F | Ht 66.0 in | Wt 215.0 lb

## 2018-07-18 DIAGNOSIS — E7800 Pure hypercholesterolemia, unspecified: Secondary | ICD-10-CM

## 2018-07-18 DIAGNOSIS — J3489 Other specified disorders of nose and nasal sinuses: Secondary | ICD-10-CM | POA: Diagnosis not present

## 2018-07-18 DIAGNOSIS — I10 Essential (primary) hypertension: Secondary | ICD-10-CM | POA: Diagnosis not present

## 2018-07-18 DIAGNOSIS — R221 Localized swelling, mass and lump, neck: Secondary | ICD-10-CM | POA: Diagnosis not present

## 2018-07-18 MED ORDER — MUPIROCIN 2 % EX OINT: 1.0000 "application " | TOPICAL_OINTMENT | Freq: Two times a day (BID) | CUTANEOUS | 0 refills | Status: DC

## 2018-07-18 NOTE — Patient Instructions (Signed)
Head downstairs for labs  Start mupirocin cream twice daily into nose for 10 days.  Please follow up if your symptoms get worse, or if your symptoms dont get better with the antibiotic.  Please return in about 3 months for annual physical with fasting lab work.

## 2018-07-18 NOTE — Assessment & Plan Note (Signed)
Continue pravastatin Update labs-she says she is not fasting today, will return when fasting - Comprehensive metabolic panel; Future - Lipid panel; Future

## 2018-07-18 NOTE — Assessment & Plan Note (Signed)
BP slightly low today with report of normal readings at home  Continue current medication She will continue to monitor BP readings at home RTC in 3 months for recheck- sooner for abnormal readings at home

## 2018-07-18 NOTE — Progress Notes (Signed)
Melissa [name] is a 67 y.o. female with the following history as recorded in EpicCare:  Patient Active Problem List   Diagnosis Date Noted  . Snoring 09/14/2017  . Rotator cuff tear 02/02/2017  . Acquired renal cyst of left kidney 01/01/2016  . DDD (degenerative disc disease), lumbar 03/04/2014  . Plantar fasciitis of left foot 10/01/2013  . IBS (irritable bowel syndrome) 05/13/2013  . Hammer toe, acquired 02/11/2013  . Metatarsalgia of both feet 02/11/2013  . Obesity 11/13/2012  . Glaucoma   . Type 2 diabetes mellitus, controlled ([place])   . Anxiety and depression   . HTN (hypertension) 01/06/2012  . GERD (gastroesophageal reflux disease) 01/06/2012  . Hypercholesteremia 01/06/2012    Current Outpatient Medications  Medication Sig Dispense Refill  . ACCU-CHEK GUIDE test strip USE AS DIRECTED TO TEST BLOOD GLUCOSE UP TO FOUR TIMES DAILY 100 each 3  . ALPRAZolam (XANAX) 0.25 MG tablet TAKE 1 TABLET(0.25 MG) BY MOUTH AT BEDTIME AS NEEDED FOR SLEEP 14 tablet 0  . ALPRAZolam (XANAX) 0.25 MG tablet TAKE 1 TABLET(0.25 MG) BY MOUTH AT BEDTIME AS NEEDED FOR SLEEP 30 tablet 0  . aspirin EC 81 MG tablet Take 81 mg by mouth daily.    . blood glucose meter kit and supplies KIT Dispense based on patient and insurance preference. Use up to four times daily as directed. DX Code: E11.9 1 each 0  . dorzolamide-timolol (COSOPT) 22.3-6.8 MG/ML ophthalmic solution Place 1 drop into both eyes 2 (two) times daily. 10 mL 0  . glipiZIDE (GLUCOTROL XL) 2.5 MG 24 hr tablet Take 1 tablet (2.5 mg total) by mouth daily with breakfast. 30 tablet 11  . IBUPROFEN PO Take by mouth as needed.    Melissa [name] Kitchen lisinopril-hydrochlorothiazide (PRINZIDE,ZESTORETIC) 20-12.5 MG tablet TAKE 1 TABLET BY MOUTH EVERY MORNING 90 tablet 0  . pravastatin (PRAVACHOL) 40 MG tablet TAKE 1 TABLET(40 MG) BY MOUTH DAILY 90 tablet 0  . mupirocin ointment (BACTROBAN) 2 % Place 1 application into the nose 2 (two) times daily. 22 g 0   No current  facility-administered medications for this visit.     Allergies: Codeine; Metformin and related; and Oxycodone  Past Medical History:  Diagnosis Date  . Allergy   . Anxiety   . Arthritis    s/p R TKR  . Chest pain on exertion   . Cyst of left kidney 2015   by lumbar MRI pending renal US  . DDD (degenerative disc disease), lumbar 03/2014   severe L2-3 with L HNP with L2/3 nerve root impingement Melissa [name] @ [facility])  . Depression   . Depression with anxiety   . Diabetes type 2, controlled ([place]) 2011   borderline  . Dyspnea   . Glaucoma   . History of ulcer disease   . HLD (hyperlipidemia)    no meds taken  . Hypertension   . PONV (postoperative nausea and vomiting)   . Ulcer     Past Surgical History:  Procedure Laterality Date  . ABDOMINAL HYSTERECTOMY  2007   fibroids, heavy bleeding  . CATARACT EXTRACTION W/ INTRAOCULAR LENS IMPLANT Bilateral   . COLONOSCOPY  04/2017   TA, diverticulosis, rpt ? ([facility])  . GLAUCOMA SURGERY    . HAMMER TOE SURGERY Bilateral   . Right hip replacement    . TONSILLECTOMY    . TOTAL KNEE ARTHROPLASTY Right 2004   TKR (Dr. Eddie [name] with [facility])  . TOTAL KNEE REVISION Right 03/2014   Dr Melissa [name] [phone]  Family History  Problem Relation Age of Onset  . Cancer Maternal Grandmother 70       ovarian  . Diabetes Maternal Grandmother   . Hypertension Maternal Grandmother   . Cancer Mother 15       bone, MM  . Stroke Other        unsure who  . CAD Neg Hx   . Colon cancer Neg Hx   . Esophageal cancer Neg Hx   . Rectal cancer Neg Hx   . Stomach cancer Neg Hx     Social History   Tobacco Use  . Smoking status: Never Smoker  . Smokeless tobacco: Never Used  Substance Use Topics  . Alcohol use: No     Subjective:  Melissa [name] is here today for follow up of neck mass, first saw her for this mass on 02/11/18 then 02/20/18 with course of keflex given, Ct soft tissue neck 03/04/18 showed: IMPRESSION: Unremarkable CT neck with contrast. The area of  clinical concern does not correspond to any concerning abnormality of the lymph nodes, salivary glands, vascular structures, or neck musculature. No underlying abscess or cellulitis.  She has also since had annual mammogram on 05/20/18: IMPRESSION: No mammographic evidence of malignancy. A result letter of this screening mammogram will be mailed directly to the patient. She is back today for re-evaluation of the neck mass, states after she completed the keflex the mass seemed smaller and less tender but has never fully resolved, she continues to feel a tender area underneath her skin to right side/ chin. We will also review her HTN, HLD today, was actually asked at her last visit in June to return in 1 month for follow up of these but she never returned until today. She is maintained on lisinopril HCTZ 20-12.5 daily for HTN, reports taking medication daily at home as prescribed, dosage was actually decreased to 10-12.5 back in June, but now appears she is back on the 20-12.5 dakily, her blood pressure slightly low today but she checks regularly at home with readings 120s-130s/70s  She was started on pravastatin around 04/10/18 for elevated cholesterol on lipid panel, reports taking pravastatin daily now and tolerating well. She also c/o nasal sore to right nare, has noticed a "sore" for past few weeks to the inside of her right nostril. She denies syncope,  fevers, chills, weakness, chest pain, shortness of breath, abdominal pain, erythema, drainage, trouble swallowing or breathing, rash, nausea, vomiting, loss of appetite.    BP Readings from Last 3 Encounters:  07/18/18 100/70  05/15/18 132/74  05/01/18 115/73    ROS- See HPI  Objective:  Vitals:   07/18/18 1021  BP: 100/70  Pulse: 72  Temp: 98 F (36.7 C)  TempSrc: Oral  SpO2: 93%  Weight: 215 lb (97.5 kg)  Height: 5' 6"  (1.676 m)    General: Well developed, well nourished, in no acute distress  Skin : Warm and dry.  Head:  Normocephalic and atraumatic  Eyes: Sclera and conjunctiva clear; pupils round and reactive to light; extraocular movements intact  Nose: no laceration, sore or tenderness noted Oropharynx: Pink, supple. No suspicious lesions  Neck: Supple without adenopathy  Lungs: Effort normal. No respiratory distress. CVS exam: normal rate and regular rhythm Extremities: No edema, cyanosis, Vessels: Symmetric bilaterally  Neurologic: Alert and oriented; speech intact; face symmetrical; moves all extremities well; CNII-XII intact without focal deficit  Psychiatric: Normal mood and affect.   Assessment:  1. Neck mass   2. Essential  hypertension   3. Hypercholesteremia   4. Nasal sore     Plan:   Return in about 3 months (around 10/18/2018) for CPE, BP recheck.  Orders Placed This Encounter  Procedures  . Comprehensive metabolic panel    Standing Status:   Future    Standing Expiration Date:   07/19/2019  . Lipid panel    Standing Status:   Future    Standing Expiration Date:   07/19/2019  . Ambulatory referral to ENT    Referral Priority:   Routine    Referral Type:   Consultation    Referral Reason:   Specialty Services Required    Requested Specialty:   Otolaryngology    Number of Visits Requested:   1    Requested Prescriptions   Signed Prescriptions Disp Refills  . mupirocin ointment (BACTROBAN) 2 % 22 g 0    Sig: Place 1 application into the nose 2 (two) times daily.     Neck mass Can not palpate a mass today We discussed referral to ENT for further evaluation, she is agreeable - Ambulatory referral to ENT  Nasal sore Not visualized on exam today Will treat with course of mupirocin-dosing, side effects discussed F/U for new, worsening or persistent symptoms - mupirocin ointment (BACTROBAN) 2 %; Place 1 application into the nose 2 (two) times daily.  Dispense: 22 g; Refill: 0

## 2018-07-22 DIAGNOSIS — Z4789 Encounter for other orthopedic aftercare: Secondary | ICD-10-CM | POA: Diagnosis not present

## 2018-07-22 DIAGNOSIS — Z9889 Other specified postprocedural states: Secondary | ICD-10-CM | POA: Diagnosis not present

## 2018-07-22 DIAGNOSIS — S46012D Strain of muscle(s) and tendon(s) of the rotator cuff of left shoulder, subsequent encounter: Secondary | ICD-10-CM | POA: Diagnosis not present

## 2018-07-22 DIAGNOSIS — S46012S Strain of muscle(s) and tendon(s) of the rotator cuff of left shoulder, sequela: Secondary | ICD-10-CM | POA: Diagnosis not present

## 2018-07-23 DIAGNOSIS — M25552 Pain in left hip: Secondary | ICD-10-CM | POA: Diagnosis not present

## 2018-07-23 DIAGNOSIS — M1612 Unilateral primary osteoarthritis, left hip: Secondary | ICD-10-CM | POA: Diagnosis not present

## 2018-07-25 ENCOUNTER — Other Ambulatory Visit: Payer: Self-pay | Admitting: Nurse Practitioner

## 2018-07-25 MED ORDER — LISINOPRIL-HYDROCHLOROTHIAZIDE 20-12.5 MG PO TABS: 1.0000 | ORAL_TABLET | Freq: Every morning | ORAL | 1 refills | Status: DC

## 2018-07-25 NOTE — Telephone Encounter (Signed)
Copied from [place] [phone] [phone]. Topic: Quick Communication - Rx Refill/Question >> Jul 25, 2018 10:54 AM Selinda Flavin B, NT wrote: **Patient states that [facility]ie [facility] was supposed to send in the new prescription at her visit on 07/18/18 and has not done this yet. Please advise.**  Medication: lisinopril-hydrochlorothiazide (PRINZIDE,ZESTORETIC) 20-12.5 MG tablet   Has the patient contacted their pharmacy? No. States that Hollie [facility] was supposed to send in new prescription at her visit (Agent: If no, request that the patient contact the pharmacy for the refill.) (Agent: If yes, when and what did the pharmacy advise?)  Preferred Pharmacy (with phone number or street name): [facility] [place], [place]  Agent: Please be advised that RX refills may take up to 3 business days. We ask that you follow-up with your pharmacy.

## 2018-08-03 DIAGNOSIS — M545 Low back pain: Secondary | ICD-10-CM | POA: Diagnosis not present

## 2018-08-03 DIAGNOSIS — G8929 Other chronic pain: Secondary | ICD-10-CM | POA: Diagnosis not present

## 2018-08-03 DIAGNOSIS — M48061 Spinal stenosis, lumbar region without neurogenic claudication: Secondary | ICD-10-CM | POA: Diagnosis not present

## 2018-08-03 DIAGNOSIS — M5117 Intervertebral disc disorders with radiculopathy, lumbosacral region: Secondary | ICD-10-CM | POA: Diagnosis not present

## 2018-08-05 ENCOUNTER — Other Ambulatory Visit: Payer: Self-pay | Admitting: Nurse Practitioner

## 2018-08-05 DIAGNOSIS — F419 Anxiety disorder, unspecified: Secondary | ICD-10-CM

## 2018-08-05 DIAGNOSIS — F32A Depression, unspecified: Secondary | ICD-10-CM

## 2018-08-05 DIAGNOSIS — F329 Major depressive disorder, single episode, unspecified: Secondary | ICD-10-CM

## 2018-08-06 DIAGNOSIS — L84 Corns and callosities: Secondary | ICD-10-CM | POA: Diagnosis not present

## 2018-08-06 DIAGNOSIS — E139 Other specified diabetes mellitus without complications: Secondary | ICD-10-CM | POA: Diagnosis not present

## 2018-08-06 DIAGNOSIS — M2042 Other hammer toe(s) (acquired), left foot: Secondary | ICD-10-CM | POA: Diagnosis not present

## 2018-08-06 DIAGNOSIS — M21612 Bunion of left foot: Secondary | ICD-10-CM | POA: Diagnosis not present

## 2018-08-06 NOTE — Telephone Encounter (Signed)
[place] Controlled Database Checked Last filled: 07/10/18 # 30 LOV w/you: 07/18/18 Next appt w/you: None

## 2018-08-08 DIAGNOSIS — E139 Other specified diabetes mellitus without complications: Secondary | ICD-10-CM | POA: Diagnosis not present

## 2018-08-08 DIAGNOSIS — G8929 Other chronic pain: Secondary | ICD-10-CM | POA: Diagnosis not present

## 2018-08-08 DIAGNOSIS — Z01818 Encounter for other preprocedural examination: Secondary | ICD-10-CM | POA: Diagnosis not present

## 2018-08-16 ENCOUNTER — Telehealth: Payer: Self-pay | Admitting: Nurse Practitioner

## 2018-08-16 NOTE — Telephone Encounter (Signed)
Copied from [place] [phone]. Topic: Quick Communication - See Telephone Encounter >> Aug 16, 2018  8:53 AM Antonieta Iba C wrote: CRM for notification. See Telephone encounter for: 08/16/18.  Pt is currently taking lisinopril-hydrochlorothiazide (PRINZIDE,ZESTORETIC) 20-12.5 MG tablet. Pt says that its not helping. Pt says that her BP reading has been 150 something /100 something. Pt says that the pharmacy suggested that PCP either change medication OR the strength. Pt says that she has been taking more to try to control her BP.   Please advise.    CB: [id number]  Pharmacy: [facility], [place] [phone] (Phone) [phone] (Fax)

## 2018-08-16 NOTE — Telephone Encounter (Signed)
I am worried about making any changes to her medications without checking her BP in office, last time she was here her BP was actually on the lower end. please schedule OV for follow up- ask her to bring home readings, her home blood pressure monitor, and medications with her. thx

## 2018-08-16 NOTE — Telephone Encounter (Signed)
I called pt and advised of below. She states she will call back to schedule OV for later date as she is having foot surgery on 08/19/18.  She wants to know if, in the meantime,  you would consider separating her blood pressure medications. She c/o leg/foot edema and has taken 1/2 extra tab of LIsinop/HCTZ for swelling. Please advise.

## 2018-08-17 ENCOUNTER — Encounter (HOSPITAL_COMMUNITY): Payer: Self-pay | Admitting: Emergency Medicine

## 2018-08-17 ENCOUNTER — Other Ambulatory Visit: Payer: Self-pay

## 2018-08-17 ENCOUNTER — Emergency Department (HOSPITAL_COMMUNITY)
Admission: EM | Admit: 2018-08-17 | Discharge: 2018-08-17 | Disposition: A | Discharge status: Home / Self Care | Payer: Medicare Other | Attending: Emergency Medicine | Admitting: Emergency Medicine

## 2018-08-17 DIAGNOSIS — G44209 Tension-type headache, unspecified, not intractable: Secondary | ICD-10-CM | POA: Diagnosis not present

## 2018-08-17 DIAGNOSIS — R51 Headache: Secondary | ICD-10-CM | POA: Insufficient documentation

## 2018-08-17 DIAGNOSIS — Z760 Encounter for issue of repeat prescription: Secondary | ICD-10-CM | POA: Diagnosis not present

## 2018-08-17 MED ORDER — ACETAMINOPHEN 500 MG PO TABS
1000.0000 mg | ORAL_TABLET | Freq: Once | ORAL | Status: AC
  Administered 2018-08-17: 1000 mg via ORAL
  Filled 2018-08-17: qty 2

## 2018-08-17 MED ORDER — LISINOPRIL-HYDROCHLOROTHIAZIDE 20-12.5 MG PO TABS: 1.0000 | ORAL_TABLET | Freq: Every morning | ORAL | 1 refills | Status: DC

## 2018-08-17 NOTE — ED Triage Notes (Signed)
Patient reports headache and hypertension x2 weeks. BP 142/98 in triage. Reports taking BP meds as prescribed. Denies blurred vision and weakness. Denies chest pain.

## 2018-08-18 NOTE — ED Provider Notes (Signed)
[facility] DEPT Provider Note   CSN: [id number] Arrival date & time: 08/17/18  1036     History   Chief Complaint Chief Complaint  Patient presents with  . Headache    HPI Melissa [name] is a 67 y.o. female.  HPI 67 year old female presents to the emergency department with complaints of elevated blood pressure x2 weeks despite her home lisinopril hydrochlorothiazide.  She checks this several times during the day.  She has had mild generalized headache.  She denies weakness of her arms or legs.  She denies blurred vision.  No chest pain or shortness of breath.  No fevers or chills.  Denies abdominal pain.  No back pain.  She is concerned about her fluctuating blood pressure.    Past Medical History:  Diagnosis Date  . Allergy   . Anxiety   . Arthritis    s/p R TKR  . Chest pain on exertion   . Cyst of left kidney 2015   by lumbar MRI pending renal US  . DDD (degenerative disc disease), lumbar 03/2014   severe L2-3 with L HNP with L2/3 nerve root impingement [name])  . Depression   . Depression with anxiety   . Diabetes type 2, controlled ([place]) 2011   borderline  . Dyspnea   . Glaucoma   . History of ulcer disease   . HLD (hyperlipidemia)    no meds taken  . Hypertension   . PONV (postoperative nausea and vomiting)   . Ulcer     Patient Active Problem List   Diagnosis Date Noted  . Snoring 09/14/2017  . Rotator cuff tear 02/02/2017  . Acquired renal cyst of left kidney 01/01/2016  . DDD (degenerative disc disease), lumbar 03/04/2014  . Plantar fasciitis of left foot 10/01/2013  . IBS (irritable bowel syndrome) 05/13/2013  . Hammer toe, acquired 02/11/2013  . Metatarsalgia of both feet 02/11/2013  . Obesity 11/13/2012  . Glaucoma   . Type 2 diabetes mellitus, controlled ([place])   . Anxiety and depression   . HTN (hypertension) 01/06/2012  . GERD (gastroesophageal reflux disease) 01/06/2012  . Hypercholesteremia 01/06/2012      Past Surgical History:  Procedure Laterality Date  . ABDOMINAL HYSTERECTOMY  2007   fibroids, heavy bleeding  . CATARACT EXTRACTION W/ INTRAOCULAR LENS IMPLANT Bilateral   . COLONOSCOPY  04/2017   TA, diverticulosis, rpt ? ([facility])  . GLAUCOMA SURGERY    . HAMMER TOE SURGERY Bilateral   . Right hip replacement    . TONSILLECTOMY    . TOTAL KNEE ARTHROPLASTY Right 2004   TKR (Dr. Eddie Dibbles with [facility])  . TOTAL KNEE REVISION Right 03/2014   Dr Al Corpus [facility]     OB History    Gravida  0   Para      Term      Preterm      AB      Living  0     SAB      TAB      Ectopic      Multiple      Live Births               Home Medications    Prior to Admission medications   Medication Sig Start Date End Date Taking? Authorizing Provider  ACCU-CHEK GUIDE test strip USE AS DIRECTED TO TEST BLOOD GLUCOSE UP TO FOUR TIMES DAILY 04/11/18   Lance Sell, NP  ALPRAZolam Duanne Moron) 0.25 MG  tablet TAKE 1 TABLET(0.25 MG) BY MOUTH AT BEDTIME AS NEEDED FOR SLEEP 05/08/18   Hoyt Koch, MD  ALPRAZolam Duanne Moron) 0.25 MG tablet TAKE 1 TABLET(0.25 MG) BY MOUTH AT BEDTIME AS NEEDED FOR SLEEP 08/07/18   Lance Sell, NP  aspirin EC 81 MG tablet Take 81 mg by mouth daily.    [provider]  blood glucose meter kit and supplies KIT Dispense based on patient and insurance preference. Use up to four times daily as directed. DX Code: E11.9 02/11/18   Lance Sell, NP  dorzolamide-timolol (COSOPT) 22.3-6.8 MG/ML ophthalmic solution Place 1 drop into both eyes 2 (two) times daily. 05/01/18   Loura Halt A, NP  glipiZIDE (GLUCOTROL XL) 2.5 MG 24 hr tablet Take 1 tablet (2.5 mg total) by mouth daily with breakfast. 04/17/18   Renato Shin, MD  IBUPROFEN PO Take by mouth as needed.    [provider]  lisinopril-hydrochlorothiazide (PRINZIDE,ZESTORETIC) 20-12.5 MG tablet Take 1 tablet by mouth every morning. 08/17/18   Jola Schmidt, MD  mupirocin  ointment (BACTROBAN) 2 % Place 1 application into the nose 2 (two) times daily. 07/18/18   Lance Sell, NP  pravastatin (PRAVACHOL) 40 MG tablet TAKE 1 TABLET(40 MG) BY MOUTH DAILY 04/15/18   Marrian Salvage, FNP    Family History Family History  Problem Relation Age of Onset  . Cancer Maternal Grandmother 49       ovarian  . Diabetes Maternal Grandmother   . Hypertension Maternal Grandmother   . Cancer Mother 40       bone, MM  . Stroke Other        unsure who  . CAD Neg Hx   . Colon cancer Neg Hx   . Esophageal cancer Neg Hx   . Rectal cancer Neg Hx   . Stomach cancer Neg Hx     Social History Social History   Tobacco Use  . Smoking status: Never Smoker  . Smokeless tobacco: Never Used  Substance Use Topics  . Alcohol use: No  . Drug use: No     Allergies   Codeine; Metformin and related; and Oxycodone   Review of Systems Review of Systems  All other systems reviewed and are negative.    Physical Exam Updated Vital Signs BP 140/90 (BP Location: Right Arm)   Pulse 67   Temp 98.1 F (36.7 C) (Oral)   Resp 16   Ht 5' 6"  (1.676 m)   Wt 97.5 kg   SpO2 100%   BMI 34.70 kg/m   Physical Exam Vitals signs and nursing note reviewed.  Constitutional:      General: She is not in acute distress.    Appearance: She is well-developed.  HENT:     Head: Normocephalic and atraumatic.  Eyes:     Pupils: Pupils are equal, round, and reactive to light.  Neck:     Musculoskeletal: Normal range of motion.  Cardiovascular:     Rate and Rhythm: Normal rate and regular rhythm.     Heart sounds: Normal heart sounds.  Pulmonary:     Effort: Pulmonary effort is normal.     Breath sounds: Normal breath sounds.  Abdominal:     General: There is no distension.     Palpations: Abdomen is soft.     Tenderness: There is no abdominal tenderness.  Musculoskeletal: Normal range of motion.  Skin:    General: Skin is warm and dry.  Neurological:  Mental  Status: She is alert and oriented to person, place, and time.     Comments: 5/5 strength in major muscle groups of  bilateral upper and lower extremities. Speech normal. No facial asymetry.   Psychiatric:        Judgment: Judgment normal.      ED Treatments / Results  Labs (all labs ordered are listed, but only abnormal results are displayed) Labs Reviewed - No data to display  EKG None  Radiology No results found.  Procedures Procedures (including critical care time)  Medications Ordered in ED Medications  acetaminophen (TYLENOL) tablet 1,000 mg (1,000 mg Oral Given 08/17/18 1313)     Initial Impression / Assessment and Plan / ED Course  I have reviewed the triage vital signs and the nursing notes.  Pertinent labs & imaging results that were available during my care of the patient were reviewed by me and considered in my medical decision making (see chart for details).     Patient is overall well-appearing.  Nonfocal neurologic exam.  Her blood pressure is reassuring here in the emergency department.  I spent the majority of my time reassuring the patient regarding her blood pressure and her examination here in the emergency department.  She is extremely anxious.  The majority of her anxiety stems from the fact that she has multiple family members on dialysis and several that have had strokes and this is what concerns her the most.  I recommended weight loss, dietary changes, daily exercise as these are the things that she can control.)  A care follow-up.  Final Clinical Impressions(s) / ED Diagnoses   Final diagnoses:  Acute non intractable tension-type headache  Medication refill    ED Discharge Orders         Ordered    lisinopril-hydrochlorothiazide (PRINZIDE,ZESTORETIC) 20-12.5 MG tablet   Every morning - 10a     08/17/18 1252           Jola Schmidt, MD 08/18/18 [phone]

## 2018-08-19 ENCOUNTER — Telehealth: Payer: Self-pay | Admitting: Nurse Practitioner

## 2018-08-19 ENCOUNTER — Other Ambulatory Visit: Payer: Self-pay | Admitting: Nurse Practitioner

## 2018-08-19 DIAGNOSIS — M205X2 Other deformities of toe(s) (acquired), left foot: Secondary | ICD-10-CM | POA: Diagnosis not present

## 2018-08-19 DIAGNOSIS — M21612 Bunion of left foot: Secondary | ICD-10-CM | POA: Diagnosis not present

## 2018-08-19 DIAGNOSIS — M2042 Other hammer toe(s) (acquired), left foot: Secondary | ICD-10-CM | POA: Diagnosis not present

## 2018-08-19 MED ORDER — HYDROCHLOROTHIAZIDE 25 MG PO TABS: 25.0000 mg | ORAL_TABLET | Freq: Every day | ORAL | 1 refills | Status: DC

## 2018-08-19 MED ORDER — LISINOPRIL 20 MG PO TABS: 20.0000 mg | ORAL_TABLET | Freq: Every day | ORAL | 1 refills | Status: DC

## 2018-08-19 NOTE — Telephone Encounter (Signed)
I have sent separate prescriptions for lisinopril 20, HCTZ 25 to her pharmacy, she needs to schedule OV in about 1 month so I can recheck her BP and see how she is doing on these doses

## 2018-08-19 NOTE — Telephone Encounter (Signed)
See 08/16/18 tele encounter. Closing this note.

## 2018-08-19 NOTE — Telephone Encounter (Signed)
I called pt to clarify below. No answer/Left mess for patient to call back.

## 2018-08-19 NOTE — Telephone Encounter (Signed)
Copied from [place] [id number]. Topic: Quick Communication - See Telephone Encounter >> Aug 19, 2018 10:09 AM Conception Chancy, NT wrote: CRM for notification. See Telephone encounter for: 08/19/18.  Amy is calling from [place] and is wanting someone to clarify with the patient how she is taking the following medications. Amy states that the patient is running out of both prescriptions every month and is having to pay out of pocket for 14 extra tablets. Please advise.  lisinopril-hydrochlorothiazide (PRINZIDE,ZESTORETIC) 20-12.5 MG tablet (new dosage from ER) glipiZIDE (GLUCOTROL XL) 2.5 MG 24 hr tablet

## 2018-08-19 NOTE — Telephone Encounter (Signed)
Called pt- left a detailed message informing pt of below.

## 2018-08-23 DIAGNOSIS — M21612 Bunion of left foot: Secondary | ICD-10-CM | POA: Diagnosis not present

## 2018-09-06 ENCOUNTER — Encounter: Payer: Self-pay | Admitting: Nurse Practitioner

## 2018-09-10 DIAGNOSIS — M21612 Bunion of left foot: Secondary | ICD-10-CM | POA: Diagnosis not present

## 2018-09-12 ENCOUNTER — Other Ambulatory Visit: Payer: Self-pay | Admitting: Nurse Practitioner

## 2018-09-16 ENCOUNTER — Other Ambulatory Visit: Payer: Self-pay | Admitting: Nurse Practitioner

## 2018-09-16 DIAGNOSIS — I10 Essential (primary) hypertension: Secondary | ICD-10-CM

## 2018-09-19 ENCOUNTER — Encounter: Payer: Self-pay | Admitting: Nurse Practitioner

## 2018-09-19 ENCOUNTER — Ambulatory Visit (INDEPENDENT_AMBULATORY_CARE_PROVIDER_SITE_OTHER): Payer: Medicare Other | Admitting: Nurse Practitioner

## 2018-09-19 VITALS — BP 118/78 | HR 76 | Ht 66.0 in | Wt 211.0 lb

## 2018-09-19 DIAGNOSIS — I10 Essential (primary) hypertension: Secondary | ICD-10-CM

## 2018-09-19 DIAGNOSIS — Z01818 Encounter for other preprocedural examination: Secondary | ICD-10-CM

## 2018-09-19 DIAGNOSIS — E119 Type 2 diabetes mellitus without complications: Secondary | ICD-10-CM | POA: Diagnosis not present

## 2018-09-19 NOTE — Assessment & Plan Note (Signed)
Stable.  Continue current medications.

## 2018-09-19 NOTE — Patient Instructions (Signed)
Head downstairs for lab work  I will be watching for your surgical clearance form

## 2018-09-19 NOTE — Progress Notes (Signed)
Melissa [name] is a 68 y.o. female with the following history as recorded in EpicCare:  Patient Active Problem List   Diagnosis Date Noted  . Snoring 09/14/2017  . Rotator cuff tear 02/02/2017  . Acquired renal cyst of left kidney 01/01/2016  . DDD (degenerative disc disease), lumbar 03/04/2014  . Plantar fasciitis of left foot 10/01/2013  . IBS (irritable bowel syndrome) 05/13/2013  . Hammer toe, acquired 02/11/2013  . Metatarsalgia of both feet 02/11/2013  . Obesity 11/13/2012  . Glaucoma   . Type 2 diabetes mellitus, controlled ([place])   . Anxiety and depression   . HTN (hypertension) 01/06/2012  . GERD (gastroesophageal reflux disease) 01/06/2012  . Hypercholesteremia 01/06/2012    Current Outpatient Medications  Medication Sig Dispense Refill  . ACCU-CHEK FASTCLIX LANCETS MISC Use to test blood sugars up to four times daily. 306 each 0  . ACCU-CHEK GUIDE test strip USE AS DIRECTED TO TEST BLOOD GLUCOSE UP TO FOUR TIMES DAILY 100 each 3  . ALPRAZolam (XANAX) 0.25 MG tablet TAKE 1 TABLET(0.25 MG) BY MOUTH AT BEDTIME AS NEEDED FOR SLEEP 30 tablet 0  . aspirin EC 81 MG tablet Take 81 mg by mouth daily.    . blood glucose meter kit and supplies KIT Dispense based on patient and insurance preference. Use up to four times daily as directed. DX Code: E11.9 1 each 0  . dorzolamide-timolol (COSOPT) 22.3-6.8 MG/ML ophthalmic solution Place 1 drop into both eyes 2 (two) times daily. 10 mL 0  . glipiZIDE (GLUCOTROL XL) 2.5 MG 24 hr tablet Take 1 tablet (2.5 mg total) by mouth daily with breakfast. 30 tablet 11  . hydrochlorothiazide (HYDRODIURIL) 25 MG tablet TAKE 1 TABLET(25 MG) BY MOUTH DAILY 90 tablet 0  . IBUPROFEN PO Take by mouth as needed.    [name] lisinopril (PRINIVIL,ZESTRIL) 20 MG tablet TAKE 1 TABLET(20 MG) BY MOUTH DAILY 90 tablet 0  . mupirocin ointment (BACTROBAN) 2 % Place 1 application into the nose 2 (two) times daily. 22 g 0  . pravastatin (PRAVACHOL) 40 MG tablet TAKE 1  TABLET(40 MG) BY MOUTH DAILY 90 tablet 0   No current facility-administered medications for this visit.     Allergies: Codeine; Metformin and related; and Oxycodone  Past Medical History:  Diagnosis Date  . Allergy   . Anxiety   . Arthritis    s/p R TKR  . Chest pain on exertion   . Cyst of left kidney 2015   by lumbar MRI pending renal US  . DDD (degenerative disc disease), lumbar 03/2014   severe L2-3 with L HNP with L2/3 nerve root impingement [name])  . Depression   . Depression with anxiety   . Diabetes type 2, controlled ([place]) 2011   borderline  . Dyspnea   . Glaucoma   . History of ulcer disease   . HLD (hyperlipidemia)    no meds taken  . Hypertension   . PONV (postoperative nausea and vomiting)   . Ulcer     Past Surgical History:  Procedure Laterality Date  . ABDOMINAL HYSTERECTOMY  2007   fibroids, heavy bleeding  . CATARACT EXTRACTION W/ INTRAOCULAR LENS IMPLANT Bilateral   . COLONOSCOPY  04/2017   TA, diverticulosis, rpt ? ([facility])  . GLAUCOMA SURGERY    . HAMMER TOE SURGERY Bilateral   . Right hip replacement    . TONSILLECTOMY    . TOTAL KNEE ARTHROPLASTY Right 2004   TKR (Dr. Eddie Dibbles with [facility])  .  TOTAL KNEE REVISION Right 03/2014   Dr Al Corpus [facility]    Family History  Problem Relation Age of Onset  . Cancer Maternal Grandmother 36       ovarian  . Diabetes Maternal Grandmother   . Hypertension Maternal Grandmother   . Cancer Mother 7       bone, MM  . Stroke Other        unsure who  . CAD Neg Hx   . Colon cancer Neg Hx   . Esophageal cancer Neg Hx   . Rectal cancer Neg Hx   . Stomach cancer Neg Hx     Social History   Tobacco Use  . Smoking status: Never Smoker  . Smokeless tobacco: Never Used  Substance Use Topics  . Alcohol use: No     Subjective:   Melissa [name] presents today for surgical clearance. She is planning to have a left total hip arthroplasty with Dr Len Childs at [facility] once she obtains surgical clearance.          She  has a significant history of hypertension, gerd, IBS, controlled type 2 diabetes mellitus, degenerative disc disease, hypercholesterolemia, glaucoma, and anxiety and depression. She is also routinely seen by endocrinology and orthopedics for care. She takes a daily aspirin but is on no other blood thinning medications. She denies any personal or family history of anesthesia complication. Shes had surgery and toe surgery in the past year without any complications. Her blood pressure appears well controlled on HCTZ 25, lisinopril 20, which she reports taking daily as prescribed, her HCTZ dosage was increased from 12.5 to 25 about a month ago. She is maintained on diabetes medications by endocrinology, last A1c 5.8 04/2018.             Today she is alert, oriented and pleasant. She states she feels well overall and is looking forward to her hip replacement so she can get back to work. She says her surgeon has requested cardiac clearance from cardiology for her.  BP Readings from Last 3 Encounters:  09/19/18 118/78  08/17/18 140/90  07/18/18 100/70   Lab Results  Component Value Date   HGBA1C 5.8 (A) 04/17/2018    Review of Systems  Constitutional: Negative for chills and fever.  Respiratory: Negative for shortness of breath.   Cardiovascular: Negative for chest pain.  Gastrointestinal: Negative for abdominal pain, constipation, diarrhea, nausea and vomiting.  Genitourinary: Negative for dysuria and hematuria.  Musculoskeletal: Negative for falls.  Neurological: Negative for dizziness, loss of consciousness and weakness.  Psychiatric/Behavioral: The patient is not nervous/anxious.    Objective:  Vitals:   09/19/18 1305  BP: 118/78  Pulse: 76  SpO2: 97%  Weight: 211 lb (95.7 kg)  Height: 5' 6"  (1.676 m)    General: Well developed, well nourished, in no acute distress  Skin : Warm and dry.  Head: Normocephalic and atraumatic.  Eyes: Sclera and conjunctiva clear; pupils round and reactive  to light; extraocular movements intact  Oropharynx: Pink, supple. No suspicious lesions  Neck: Supple Lungs: Respirations unlabored; clear to auscultation bilaterally without wheeze, rales, rhonchi  CVS exam: normal rate and regular rhythm.  Musculoskeletal: No deformities Extremities: No edema, cyanosis, clubbing  Vessels: Symmetric bilaterally  Neurologic: Alert and oriented; speech intact; face symmetrical; moves all extremities well; CNII-XII intact without focal deficit  Psychiatric: Normal mood and affect.  Assessment:  1. Pre-operative clearance   2. Essential hypertension   3. Controlled type 2 diabetes mellitus without complication, without  long-term current use of insulin (HCC)     Plan:   Pre-operative clearance History and PE without any contraindications to hip surgery Will update CBC, CMET, UA today. Cardiac clearance will be given by cardiology I have not received any surgical clearance form for her at this time - CBC; Future - Comprehensive metabolic panel; Future - Urinalysis, Routine w reflex microscopic; Future   Return in about 3 months (around 12/19/2018) for CPE.  Orders Placed This Encounter  Procedures  . CBC    Standing Status:   Future    Standing Expiration Date:   09/20/2019  . Comprehensive metabolic panel    Standing Status:   Future    Standing Expiration Date:   09/20/2019  . Urinalysis, Routine w reflex microscopic    Standing Status:   Future    Standing Expiration Date:   09/19/2019    Requested Prescriptions    No prescriptions requested or ordered in this encounter

## 2018-09-19 NOTE — Assessment & Plan Note (Signed)
Stable Continue current medications Continue f/u with endocrinology as instructed

## 2018-09-20 ENCOUNTER — Telehealth: Payer: Self-pay | Admitting: *Deleted

## 2018-09-20 NOTE — Telephone Encounter (Signed)
Please call pt to schedule

## 2018-09-20 NOTE — Telephone Encounter (Signed)
If she would like to see our sports medicine providers, no referral is needed

## 2018-09-20 NOTE — Telephone Encounter (Signed)
appt made

## 2018-09-20 NOTE — Telephone Encounter (Signed)
Copied from [place] [phone] [id number]. Topic: Referral - Request for Referral >> Sep 20, 2018 10:50 AM Melissa [name] wrote: Pt called in to request to go ahead with sports medicine referral. Pt says that it was suggested in yesterdays visit with [name]. Please assist.

## 2018-09-24 DIAGNOSIS — M21612 Bunion of left foot: Secondary | ICD-10-CM | POA: Diagnosis not present

## 2018-09-29 ENCOUNTER — Other Ambulatory Visit: Payer: Self-pay | Admitting: Nurse Practitioner

## 2018-09-29 DIAGNOSIS — F329 Major depressive disorder, single episode, unspecified: Secondary | ICD-10-CM

## 2018-09-29 DIAGNOSIS — F32A Depression, unspecified: Secondary | ICD-10-CM

## 2018-09-29 DIAGNOSIS — F419 Anxiety disorder, unspecified: Secondary | ICD-10-CM

## 2018-09-30 NOTE — Telephone Encounter (Signed)
[place] Controlled Database Checked Last filled: 08/07/18 # 30 LOV w/you: 09/19/18 Next appt w/you: None

## 2018-10-07 NOTE — Progress Notes (Deleted)
[facility] [place], [place] [address] Phone: [phone] Subjective:    I'm seeing this patient by the request  of:    CC: Hand pain  WHQ:[id number]  Melissa [name] is a 68 y.o. female coming in with complaint of ***  Onset-  Location Duration-  Character- Aggravating factors- Reliving factors-  Therapies tried-  Severity-     Past Medical History:  Diagnosis Date  . Allergy   . Anxiety   . Arthritis    s/p R TKR  . Chest pain on exertion   . Cyst of left kidney 2015   by lumbar MRI pending renal US  . DDD (degenerative disc disease), lumbar 03/2014   severe L2-3 with L HNP with L2/3 nerve root impingement [name])  . Depression   . Depression with anxiety   . Diabetes type 2, controlled ([place]) 2011   borderline  . Dyspnea   . Glaucoma   . History of ulcer disease   . HLD (hyperlipidemia)    no meds taken  . Hypertension   . PONV (postoperative nausea and vomiting)   . Ulcer    Past Surgical History:  Procedure Laterality Date  . ABDOMINAL HYSTERECTOMY  2007   fibroids, heavy bleeding  . CATARACT EXTRACTION W/ INTRAOCULAR LENS IMPLANT Bilateral   . COLONOSCOPY  04/2017   TA, diverticulosis, rpt ? ([facility])  . GLAUCOMA SURGERY    . HAMMER TOE SURGERY Bilateral   . Right hip replacement    . TONSILLECTOMY    . TOTAL KNEE ARTHROPLASTY Right 2004   TKR (Dr. Eddie Dibbles with [facility])  . TOTAL KNEE REVISION Right 03/2014   Dr Al Corpus [facility]   Social History   Socioeconomic History  . Marital status: Divorced    Spouse name: Not on file  . Number of children: 0  . Years of education: 55  . Highest education level: Not on file  Occupational History  . Occupation: work with disabilities    Employer: [employer]  . Financial resource strain: Not hard at all  . Food insecurity:    Worry: Never true    Inability: Never true  . Transportation needs:    Medical: No    Non-medical: No  Tobacco  Use  . Smoking status: Never Smoker  . Smokeless tobacco: Never Used  Substance and Sexual Activity  . Alcohol use: No  . Drug use: No  . Sexual activity: Not Currently    Birth control/protection: Surgical  Lifestyle  . Physical activity:    Days per week: 0 days    Minutes per session: 0 min  . Stress: Only a little  Relationships  . Social connections:    Talks on phone: More than three times a week    Gets together: More than three times a week    Attends religious service: More than 4 times per year    Active member of club or organization: Yes    Attends meetings of clubs or organizations: More than 4 times per year    Relationship status: Divorced  Other Topics Concern  . Not on file  Social History Narrative   Caffeine: rare   Lives alone   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[employer]   Fun/Hobbies: Gardening    Allergies  Allergen Reactions  . Codeine Other (See Comments)    palpitations  . Metformin And Related Nausea Only  . Oxycodone Nausea  And Vomiting   Family History  Problem Relation Age of Onset  . Cancer Maternal Grandmother 75       ovarian  . Diabetes Maternal Grandmother   . Hypertension Maternal Grandmother   . Cancer Mother 18       bone, MM  . Stroke Other        unsure who  . CAD Neg Hx   . Colon cancer Neg Hx   . Esophageal cancer Neg Hx   . Rectal cancer Neg Hx   . Stomach cancer Neg Hx     Current Outpatient Medications (Endocrine & Metabolic):  .  glipiZIDE (GLUCOTROL XL) 2.5 MG 24 hr tablet, Take 1 tablet (2.5 mg total) by mouth daily with breakfast.  Current Outpatient Medications (Cardiovascular):  .  hydrochlorothiazide (HYDRODIURIL) 25 MG tablet, TAKE 1 TABLET(25 MG) BY MOUTH DAILY .  lisinopril (PRINIVIL,ZESTRIL) 20 MG tablet, TAKE 1 TABLET(20 MG) BY MOUTH DAILY .  pravastatin (PRAVACHOL) 40 MG tablet, TAKE 1 TABLET(40 MG) BY MOUTH DAILY   Current Outpatient Medications (Analgesics):  .  aspirin EC 81 MG  tablet, Take 81 mg by mouth daily. .  IBUPROFEN PO, Take by mouth as needed.   Current Outpatient Medications (Other):  [name]  ACCU-CHEK FASTCLIX LANCETS MISC, Use to test blood sugars up to four times daily. [name]  ACCU-CHEK GUIDE test strip, USE AS DIRECTED TO TEST BLOOD GLUCOSE UP TO FOUR TIMES DAILY .  ALPRAZolam (XANAX) 0.25 MG tablet, TAKE 1 TABLET(0.25 MG) BY MOUTH AT BEDTIME AS NEEDED FOR SLEEP .  blood glucose meter kit and supplies KIT, Dispense based on patient and insurance preference. Use up to four times daily as directed. DX Code: E11.9 .  dorzolamide-timolol (COSOPT) 22.3-6.8 MG/ML ophthalmic solution, Place 1 drop into both eyes 2 (two) times daily. .  mupirocin ointment (BACTROBAN) 2 %, Place 1 application into the nose 2 (two) times daily.    Past medical history, social, surgical and family history all reviewed in electronic medical record.  No pertanent information unless stated regarding to the chief complaint.   Review of Systems:  No headache, visual changes, nausea, vomiting, diarrhea, constipation, dizziness, abdominal pain, skin rash, fevers, chills, night sweats, weight loss, swollen lymph nodes, body aches, joint swelling, muscle aches, chest pain, shortness of breath, mood changes.   Objective  There were no vitals taken for this visit. Systems examined below as of    General: No apparent distress alert and oriented x3 mood and affect normal, dressed appropriately.  HEENT: Pupils equal, extraocular movements intact  Respiratory: Patient's speak in full sentences and does not appear short of breath  Cardiovascular: No lower extremity edema, non tender, no erythema  Skin: Warm dry intact with no signs of infection or rash on extremities or on axial skeleton.  Abdomen: Soft nontender  Neuro: Cranial nerves II through XII are intact, neurovascularly intact in all extremities with 2+ DTRs and 2+ pulses.  Lymph: No lymphadenopathy of posterior or anterior cervical chain  or axillae bilaterally.  Gait normal with good balance and coordination.  MSK:  Non tender with full range of motion and good stability and symmetric strength and tone of shoulders, elbows, wrist, hip, knee and ankles bilaterally.     Impression and Recommendations:     This case required medical decision making of moderate complexity. The above documentation has been reviewed and is accurate and complete Melissa Pulley, DO       Note: This dictation was prepared with  Dragon dictation along with smaller Company secretary. Any transcriptional errors that result from this process are unintentional.

## 2018-10-08 ENCOUNTER — Ambulatory Visit: Payer: Medicare Other | Admitting: Family Medicine

## 2018-10-16 ENCOUNTER — Telehealth: Payer: Self-pay | Admitting: *Deleted

## 2018-10-16 NOTE — Telephone Encounter (Signed)
Melissa [name] received form from PCP stating patient needs cardiology clearance. However, when she mentioned this to patient, the patient knew nothing about cardiac clearance needs and the patient does not have a cardiologist currently. What should I tell Melissa [name] at [facility] [place]?

## 2018-10-16 NOTE — Telephone Encounter (Signed)
Copied from [place] [id number]. Topic: General - Other >> Oct 16, 2018 10:05 AM Carolyn Stare wrote:  Melissa [name] with [facility] orthopedic would like a call back about pt hip surgery  that is scheduled for 11/27/2018   [phone]

## 2018-10-17 NOTE — Telephone Encounter (Signed)
Pt states she was not referred to a cardiologist and that was never discussed. Pt was referred to an ENT Doctor. Pt needs surgical clearance to go to [facility] with [facility] for Hip surgery. Until PCP gives clearance , surgery has been put on hold. Surgery is scheduled for 03.25.2020/ Cb# [id number] for Je[facility] Hawking / please advise

## 2018-10-17 NOTE — Telephone Encounter (Signed)
I am sorry for the misunderstanding, I thought that she told me her surgeon was sending her to cardiology for clearance Please have her schedule OV with me for an EKG, so that I can complete her clearance

## 2018-10-17 NOTE — Telephone Encounter (Signed)
Pt informed of below. She states the surgical coordinator at ortho advised her they will do their own EKG. She is going to call the surgical coordinator and confirm this. She will call us back and let us know how to proceed.

## 2018-10-17 NOTE — Telephone Encounter (Signed)
Pt called back in to make provider aware that she have her Pre-Op visit on Feb 27th with Cardiologist. Pt says after having that visit she will know more. Pt says that she is scheduled to have surgery on March 25th

## 2018-10-18 ENCOUNTER — Other Ambulatory Visit: Payer: Self-pay | Admitting: Nurse Practitioner

## 2018-10-18 MED ORDER — LISINOPRIL 30 MG PO TABS: 30.0000 mg | ORAL_TABLET | Freq: Every day | ORAL | 1 refills | Status: DC

## 2018-10-18 NOTE — Telephone Encounter (Signed)
I have sent a new prescription for lisinopril 30 once daily. Please return in about 3-4 weeks for an office visit so I can see how shes doing on this adjustment-bring blood pressure logs to visit She also needs to stop by the lab for labwork, she never had drawn after last visit and we need to keep an eye on her labs with medication adjustments please

## 2018-10-18 NOTE — Telephone Encounter (Signed)
Copied from [place] [id number]. Topic: Quick Communication - Rx Refill/Question >> Oct 18, 2018  8:46 AM Virl Axe D wrote: Medication: lisinopril (PRINIVIL,ZESTRIL) 20 MG tablet / Pt is requesting refill for lisinopril with instructions of 1 in the morning and 1 in the evening as suggested by her pharmacy. Pt stated she is out of medication. Please advise.  Has the patient contacted their pharmacy? Yes.   (Agent: If no, request that the patient contact the pharmacy for the refill.) (Agent: If yes, when and what did the pharmacy advise?)  Preferred Pharmacy (with phone number or street name): [facility] [address], [place] [phone] (Phone) [phone] (Fax)  Agent: Please be advised that RX refills may take up to 3 business days. We ask that you follow-up with your pharmacy.

## 2018-10-18 NOTE — Telephone Encounter (Signed)
Pt informed of below.  

## 2018-10-18 NOTE — Telephone Encounter (Signed)
Call to patient- she states she has surgery- hip replacement this 3/25. They are going to check heart pre-op. Patient states she has fluctuations in BP- she has been taking extra pills to bring it down at night. She reports high BP mostly at night. Encouraged appointment to discuss BP readings and need for additional medication. Patient keeps close eye on BP and she states if she increases her dose and it goes down low she will stop. Told patient would send request- but she may be required to come in- patient wants to try this dosing over the weekend and see how she does.   Patient is out of medication and needs RF- she wants to increase her dosing. Sent for review.

## 2018-10-23 ENCOUNTER — Ambulatory Visit: Payer: Medicare Other | Admitting: Family Medicine

## 2018-10-23 DIAGNOSIS — L03032 Cellulitis of left toe: Secondary | ICD-10-CM | POA: Diagnosis not present

## 2018-10-23 DIAGNOSIS — M2041 Other hammer toe(s) (acquired), right foot: Secondary | ICD-10-CM | POA: Diagnosis not present

## 2018-10-23 DIAGNOSIS — M21611 Bunion of right foot: Secondary | ICD-10-CM | POA: Diagnosis not present

## 2018-10-23 DIAGNOSIS — M21612 Bunion of left foot: Secondary | ICD-10-CM | POA: Diagnosis not present

## 2018-10-23 DIAGNOSIS — R590 Localized enlarged lymph nodes: Secondary | ICD-10-CM | POA: Diagnosis not present

## 2018-10-23 DIAGNOSIS — R221 Localized swelling, mass and lump, neck: Secondary | ICD-10-CM | POA: Diagnosis not present

## 2018-10-23 DIAGNOSIS — L02612 Cutaneous abscess of left foot: Secondary | ICD-10-CM | POA: Diagnosis not present

## 2018-10-23 NOTE — Progress Notes (Deleted)
[facility] [address], [place] [address] Phone: [phone] Subjective:    I'm seeing this patient by the request  of:    CC:   [id number]  Melissa [name] is a 68 y.o. female coming in with complaint of ***  Onset-  Location Duration-  Character- Aggravating factors- Reliving factors-  Therapies tried-  Severity-     Past Medical History:  Diagnosis Date  . Allergy   . Anxiety   . Arthritis    s/p R TKR  . Chest pain on exertion   . Cyst of left kidney 2015   by lumbar MRI pending renal US  . DDD (degenerative disc disease), lumbar 03/2014   severe L2-3 with L HNP with L2/3 nerve root impingement [name])  . Depression   . Depression with anxiety   . Diabetes type 2, controlled ([place]) 2011   borderline  . Dyspnea   . Glaucoma   . History of ulcer disease   . HLD (hyperlipidemia)    no meds taken  . Hypertension   . PONV (postoperative nausea and vomiting)   . Ulcer    Past Surgical History:  Procedure Laterality Date  . ABDOMINAL HYSTERECTOMY  2007   fibroids, heavy bleeding  . CATARACT EXTRACTION W/ INTRAOCULAR LENS IMPLANT Bilateral   . COLONOSCOPY  04/2017   TA, diverticulosis, rpt ? ([facility])  . GLAUCOMA SURGERY    . HAMMER TOE SURGERY Bilateral   . Right hip replacement    . TONSILLECTOMY    . TOTAL KNEE ARTHROPLASTY Right 2004   TKR (Dr. Eddie Dibbles with [facility])  . TOTAL KNEE REVISION Right 03/2014   Dr Al Corpus [facility]   Social History   Socioeconomic History  . Marital status: Divorced    Spouse name: Not on file  . Number of children: 0  . Years of education: 59  . Highest education level: Not on file  Occupational History  . Occupation: work with disabilities    Employer: [employer]  . Financial resource strain: Not hard at all  . Food insecurity:    Worry: Never true    Inability: Never true  . Transportation needs:    Medical: No    Non-medical: No  Tobacco Use  .  Smoking status: Never Smoker  . Smokeless tobacco: Never Used  Substance and Sexual Activity  . Alcohol use: No  . Drug use: No  . Sexual activity: Not Currently    Birth control/protection: Surgical  Lifestyle  . Physical activity:    Days per week: 0 days    Minutes per session: 0 min  . Stress: Only a little  Relationships  . Social connections:    Talks on phone: More than three times a week    Gets together: More than three times a week    Attends religious service: More than 4 times per year    Active member of club or organization: Yes    Attends meetings of clubs or organizations: More than 4 times per year    Relationship status: Divorced  Other Topics Concern  . Not on file  Social History Narrative   Caffeine: rare   Lives alone   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[employer]   Fun/Hobbies: Gardening    Allergies  Allergen Reactions  . Codeine Other (See Comments)    palpitations  . Metformin And Related Nausea Only  . Oxycodone Nausea And  Vomiting   Family History  Problem Relation Age of Onset  . Cancer Maternal Grandmother 35       ovarian  . Diabetes Maternal Grandmother   . Hypertension Maternal Grandmother   . Cancer Mother 35       bone, MM  . Stroke Other        unsure who  . CAD Neg Hx   . Colon cancer Neg Hx   . Esophageal cancer Neg Hx   . Rectal cancer Neg Hx   . Stomach cancer Neg Hx     Current Outpatient Medications (Endocrine & Metabolic):  .  glipiZIDE (GLUCOTROL XL) 2.5 MG 24 hr tablet, Take 1 tablet (2.5 mg total) by mouth daily with breakfast.  Current Outpatient Medications (Cardiovascular):  .  hydrochlorothiazide (HYDRODIURIL) 25 MG tablet, TAKE 1 TABLET(25 MG) BY MOUTH DAILY .  lisinopril (PRINIVIL,ZESTRIL) 30 MG tablet, Take 1 tablet (30 mg total) by mouth daily. .  pravastatin (PRAVACHOL) 40 MG tablet, TAKE 1 TABLET(40 MG) BY MOUTH DAILY   Current Outpatient Medications (Analgesics):  .  aspirin EC 81 MG  tablet, Take 81 mg by mouth daily. .  IBUPROFEN PO, Take by mouth as needed.   Current Outpatient Medications (Other):  [name]  ACCU-CHEK FASTCLIX LANCETS MISC, Use to test blood sugars up to four times daily. [name]  ACCU-CHEK GUIDE test strip, USE AS DIRECTED TO TEST BLOOD GLUCOSE UP TO FOUR TIMES DAILY .  ALPRAZolam (XANAX) 0.25 MG tablet, TAKE 1 TABLET(0.25 MG) BY MOUTH AT BEDTIME AS NEEDED FOR SLEEP .  blood glucose meter kit and supplies KIT, Dispense based on patient and insurance preference. Use up to four times daily as directed. DX Code: E11.9 .  dorzolamide-timolol (COSOPT) 22.3-6.8 MG/ML ophthalmic solution, Place 1 drop into both eyes 2 (two) times daily. .  mupirocin ointment (BACTROBAN) 2 %, Place 1 application into the nose 2 (two) times daily.    Past medical history, social, surgical and family history all reviewed in electronic medical record.  No pertanent information unless stated regarding to the chief complaint.   Review of Systems:  No headache, visual changes, nausea, vomiting, diarrhea, constipation, dizziness, abdominal pain, skin rash, fevers, chills, night sweats, weight loss, swollen lymph nodes, body aches, joint swelling, muscle aches, chest pain, shortness of breath, mood changes.   Objective  There were no vitals taken for this visit. Systems examined below as of    General: No apparent distress alert and oriented x3 mood and affect normal, dressed appropriately.  HEENT: Pupils equal, extraocular movements intact  Respiratory: Patient's speak in full sentences and does not appear short of breath  Cardiovascular: No lower extremity edema, non tender, no erythema  Skin: Warm dry intact with no signs of infection or rash on extremities or on axial skeleton.  Abdomen: Soft nontender  Neuro: Cranial nerves II through XII are intact, neurovascularly intact in all extremities with 2+ DTRs and 2+ pulses.  Lymph: No lymphadenopathy of posterior or anterior cervical chain  or axillae bilaterally.  Gait normal with good balance and coordination.  MSK:  Non tender with full range of motion and good stability and symmetric strength and tone of shoulders, elbows, wrist, hip, knee and ankles bilaterally.     Impression and Recommendations:     This case required medical decision making of moderate complexity. The above documentation has been reviewed and is accurate and complete Lyndal Pulley, DO       Note: This dictation was prepared  with Dragon dictation along with smaller phrase technology. Any transcriptional errors that result from this process are unintentional.

## 2018-10-30 ENCOUNTER — Encounter (HOSPITAL_COMMUNITY): Payer: Self-pay

## 2018-10-30 ENCOUNTER — Ambulatory Visit (HOSPITAL_COMMUNITY)
Admission: EM | Admit: 2018-10-30 | Discharge: 2018-10-30 | Disposition: A | Discharge status: Home / Self Care | Payer: Medicare Other | Attending: Family Medicine | Admitting: Family Medicine

## 2018-10-30 ENCOUNTER — Other Ambulatory Visit: Payer: Self-pay

## 2018-10-30 ENCOUNTER — Ambulatory Visit (INDEPENDENT_AMBULATORY_CARE_PROVIDER_SITE_OTHER): Payer: Medicare Other

## 2018-10-30 DIAGNOSIS — M25551 Pain in right hip: Secondary | ICD-10-CM

## 2018-10-30 DIAGNOSIS — M25511 Pain in right shoulder: Secondary | ICD-10-CM

## 2018-10-30 DIAGNOSIS — W19XXXA Unspecified fall, initial encounter: Secondary | ICD-10-CM | POA: Diagnosis not present

## 2018-10-30 MED ORDER — NAPROXEN 375 MG PO TABS: 375.0000 mg | ORAL_TABLET | Freq: Two times a day (BID) | ORAL | 0 refills | Status: DC

## 2018-10-30 NOTE — ED Triage Notes (Signed)
Pt cc pt  missed her chair and fell in the floor last night.

## 2018-10-30 NOTE — ED Provider Notes (Signed)
Melissa [name]   [id number] 10/30/18 Arrival Time: 7341  ASSESSMENT & PLAN:  1. Acute pain of right shoulder   2. Right hip pain     I have personally viewed the imaging studies ordered this visit. No fractures or dislocation observed.  Imaging: Dg Shoulder Right  Result Date: 10/30/2018 CLINICAL DATA:  Right shoulder pain post fall. EXAM: RIGHT SHOULDER - 2+ VIEW COMPARISON:  None. FINDINGS: There is no evidence of fracture or dislocation. 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 Soft tissues are unremarkable. IMPRESSION: 1. No acute fracture or dislocation identified about the right shoulder. 2. 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 Electronically Signed   By: Fidela Salisbury M.D.   On: 10/30/2018 16:10   Meds ordered this encounter  Medications  . naproxen (NAPROSYN) 375 MG tablet    Sig: Take 1 tablet (375 mg total) by mouth 2 (two) times daily.    Dispense:  14 tablet    Refill:  0   Encouraged ROM as he tolerates.  Follow-up Information    Lance Sell, NP.   Specialty:  Nurse Practitioner Why:  As needed. Contact information: [address] [place] [address] [phone]          Reviewed expectations re: course of current medical issues. Questions answered. Outlined signs and symptoms indicating need for more acute intervention. Patient verbalized understanding. After Visit Summary given.  SUBJECTIVE: History from: patient. Melissa [name] is a 68 y.o. female who reports fairly persistent moderate pain of her right shoulder mainly; a little pain of her right hip but this is very mild and improving; both described as aching without radiation. Onset: abrupt, yesterday. Injury/trama: reports sliding out of chair to the floor; landing on her R hip and R shoulder; noticed much more soreness of R shoulder this morning; hip improving; ambulatory immediately after fall and since without difficulty. Aggravating  factors: movement of R shoulder. Alleviating factors: a little better with rest. Associated symptoms: none reported. Extremity sensation changes or weakness: none. No change in bowel/bladder habits. Self treatment: has not tried OTCs for relief of pain. History of similar: no.  Past Surgical History:  Procedure Laterality Date  . ABDOMINAL HYSTERECTOMY  2007   fibroids, heavy bleeding  . CATARACT EXTRACTION W/ INTRAOCULAR LENS IMPLANT Bilateral   . COLONOSCOPY  04/2017   TA, diverticulosis, rpt ? ([facility])  . GLAUCOMA SURGERY    . HAMMER TOE SURGERY Bilateral   . Right hip replacement    . TONSILLECTOMY    . TOTAL KNEE ARTHROPLASTY Right 2004   TKR (Dr. Eddie Dibbles with [facility])  . TOTAL KNEE REVISION Right 03/2014   Dr Al Corpus [facility]     ROS: As per HPI. All other systems negative.    OBJECTIVE:  Vitals:   10/30/18 1503 10/30/18 1505  BP: 126/68   Resp: 18   Temp: 98.1 F (36.7 C)   TempSrc: Oral   SpO2: 100%   Weight:  97.5 kg    General appearance: alert; no distress HEENT: [place]; AT; PERRLA; EOMI Neck: FROM without midline tenderness Extremities: . RUE warm and well perfused; poorly localized moderate tenderness over right shoulder (more posterior); without gross deformities; with no swelling; with no bruising; ROM: normal but with reported discomfort CV: brisk extremity capillary refill of RUE; 2+ radial pulse of RUE. Abd: soft; non-tender Skin: warm and dry; no visible rashes No specific tenderness over R hip. RLE with FROM. Neurologic: gait normal; normal reflexes of  RUE, LUE, RLE and LLE; normal sensation of RUE, LUE, RLE and LLE; normal strength of RUE, LUE, RLE and LLE Psychological: alert and cooperative; normal mood and affect  Allergies  Allergen Reactions  . Codeine Other (See Comments)    palpitations  . Metformin And Related Nausea Only  . Oxycodone Nausea And Vomiting    Past Medical History:  Diagnosis Date  . Allergy   . Anxiety   . Arthritis     s/p R TKR  . Chest pain on exertion   . Cyst of left kidney 2015   by lumbar MRI pending renal US  . DDD (degenerative disc disease), lumbar 03/2014   severe L2-3 with L HNP with L2/3 nerve root impingement [name])  . Depression   . Depression with anxiety   . Diabetes type 2, controlled ([place]) 2011   borderline  . Dyspnea   . Glaucoma   . History of ulcer disease   . HLD (hyperlipidemia)    no meds taken  . Hypertension   . PONV (postoperative nausea and vomiting)   . Ulcer    Social History   Socioeconomic History  . Marital status: Divorced    Spouse name: Not on file  . Number of children: 0  . Years of education: 28  . Highest education level: Not on file  Occupational History  . Occupation: work with disabilities    Employer: [employer]  . Financial resource strain: Not hard at all  . Food insecurity:    Worry: Never true    Inability: Never true  . Transportation needs:    Medical: No    Non-medical: No  Tobacco Use  . Smoking status: Never Smoker  . Smokeless tobacco: Never Used  Substance and Sexual Activity  . Alcohol use: No  . Drug use: No  . Sexual activity: Not Currently    Birth control/protection: Surgical  Lifestyle  . Physical activity:    Days per week: 0 days    Minutes per session: 0 min  . Stress: Only a little  Relationships  . Social connections:    Talks on phone: More than three times a week    Gets together: More than three times a week    Attends religious service: More than 4 times per year    Active member of club or organization: Yes    Attends meetings of clubs or organizations: More than 4 times per year    Relationship status: Divorced  Other Topics Concern  . Not on file  Social History Narrative   Caffeine: rare   Lives alone   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[employer]   Fun/Hobbies: Gardening    Family History  Problem Relation Age of Onset  . Cancer Maternal Grandmother 31        ovarian  . Diabetes Maternal Grandmother   . Hypertension Maternal Grandmother   . Cancer Mother 17       bone, MM  . Stroke Other        unsure who  . CAD Neg Hx   . Colon cancer Neg Hx   . Esophageal cancer Neg Hx   . Rectal cancer Neg Hx   . Stomach cancer Neg Hx    Past Surgical History:  Procedure Laterality Date  . ABDOMINAL HYSTERECTOMY  2007   fibroids, heavy bleeding  . CATARACT EXTRACTION W/ INTRAOCULAR LENS IMPLANT Bilateral   . COLONOSCOPY  04/2017  TA, diverticulosis, rpt ? ([facility])  . GLAUCOMA SURGERY    . HAMMER TOE SURGERY Bilateral   . Right hip replacement    . TONSILLECTOMY    . TOTAL KNEE ARTHROPLASTY Right 2004   TKR (Dr. Eddie Dibbles with [facility])  . TOTAL KNEE REVISION Right 03/2014   Dr Al Corpus [facility]      Vanessa Kick, MD 11/06/18 [phone]

## 2018-11-05 ENCOUNTER — Other Ambulatory Visit: Payer: Self-pay | Admitting: Nurse Practitioner

## 2018-11-06 ENCOUNTER — Other Ambulatory Visit (INDEPENDENT_AMBULATORY_CARE_PROVIDER_SITE_OTHER): Payer: Medicare Other

## 2018-11-06 DIAGNOSIS — Z01818 Encounter for other preprocedural examination: Secondary | ICD-10-CM | POA: Diagnosis not present

## 2018-11-06 DIAGNOSIS — L03032 Cellulitis of left toe: Secondary | ICD-10-CM | POA: Diagnosis not present

## 2018-11-06 DIAGNOSIS — M21611 Bunion of right foot: Secondary | ICD-10-CM | POA: Diagnosis not present

## 2018-11-06 DIAGNOSIS — M2041 Other hammer toe(s) (acquired), right foot: Secondary | ICD-10-CM | POA: Diagnosis not present

## 2018-11-06 LAB — URINALYSIS, ROUTINE W REFLEX MICROSCOPIC
Bilirubin Urine: NEGATIVE
Hgb urine dipstick: NEGATIVE
Ketones, ur: NEGATIVE
Leukocytes,Ua: NEGATIVE
Nitrite: NEGATIVE
Specific Gravity, Urine: >=1.03 — AB (ref 1.000–1.030)
Total Protein, Urine: NEGATIVE
Urine Glucose: NEGATIVE
Urobilinogen, UA: 0.2 (ref 0.0–1.0)
pH: 5.5 (ref 5.0–8.0)

## 2018-11-06 LAB — COMPREHENSIVE METABOLIC PANEL
ALT: 17 U/L (ref 0–35)
AST: 16 U/L (ref 0–37)
Albumin: 4 g/dL (ref 3.5–5.2)
Alkaline Phosphatase: 106 U/L (ref 39–117)
BUN: 15 mg/dL (ref 6–23)
CO2: 28 mEq/L (ref 19–32)
Calcium: 9.3 mg/dL (ref 8.4–10.5)
Chloride: 106 mEq/L (ref 96–112)
Creatinine, Ser: 0.7 mg/dL (ref 0.40–1.20)
GFR: 100.65 mL/min (ref >60.00)
Glucose, Bld: 85 mg/dL (ref 70–99)
Potassium: 3.6 mEq/L (ref 3.5–5.1)
Sodium: 142 mEq/L (ref 135–145)
Total Bilirubin: 0.2 mg/dL (ref 0.2–1.2)
Total Protein: 7.4 g/dL (ref 6.0–8.3)

## 2018-11-06 LAB — CBC
HCT: 36.5 % (ref 36.0–46.0)
Hemoglobin: 11.8 g/dL — ABNORMAL LOW (ref 12.0–15.0)
MCHC: 32.5 g/dL (ref 30.0–36.0)
MCV: 82.2 fl (ref 78.0–100.0)
Platelets: 311 10*3/uL (ref 150.0–400.0)
RBC: 4.43 Mil/uL (ref 3.87–5.11)
RDW: 15.6 % — ABNORMAL HIGH (ref 11.5–15.5)
WBC: 5.3 10*3/uL (ref 4.0–10.5)

## 2018-11-11 NOTE — Progress Notes (Signed)
[name] Melissa [name], [place] [address] Phone: [phone] Subjective:   I Melissa [name] am serving as a Education administrator for Dr. Hulan Saas.  CC: Right finger pain  GUR:[id number]  Melissa [name] is a 68 y.o. female coming in with complaint of right middle finger pain. Tapes up her fingers using Band-Aids which helps with pain. Finger gets stuck in flexion.   Onset- Chronic Location- right middle finger Duration-dull aching pain Character-well, throbbing aching pain Severity-5 out of 10     Past Medical History:  Diagnosis Date  . Allergy   . Anxiety   . Arthritis    s/p R TKR  . Chest pain on exertion   . Cyst of left kidney 2015   by lumbar MRI pending renal US  . DDD (degenerative disc disease), lumbar 03/2014   severe L2-3 with L HNP with L2/3 nerve root impingement Melissa [name] @ [facility])  . Depression   . Depression with anxiety   . Diabetes type 2, controlled ([place]) 2011   borderline  . Dyspnea   . Glaucoma   . History of ulcer disease   . HLD (hyperlipidemia)    no meds taken  . Hypertension   . PONV (postoperative nausea and vomiting)   . Ulcer    Past Surgical History:  Procedure Laterality Date  . ABDOMINAL HYSTERECTOMY  2007   fibroids, heavy bleeding  . CATARACT EXTRACTION W/ INTRAOCULAR LENS IMPLANT Bilateral   . COLONOSCOPY  04/2017   TA, diverticulosis, rpt ? ([facility])  . GLAUCOMA SURGERY    . HAMMER TOE SURGERY Bilateral   . Right hip replacement    . TONSILLECTOMY    . TOTAL KNEE ARTHROPLASTY Right 2004   TKR (Dr. Eddie Dibbles with [facility])  . TOTAL KNEE REVISION Right 03/2014   Dr Al Corpus [facility]   Social History   Socioeconomic History  . Marital status: Divorced    Spouse name: Not on file  . Number of children: 0  . Years of education: 38  . Highest education level: Not on file  Occupational History  . Occupation: work with disabilities    Employer: [employer]  . Financial resource strain:  Not hard at all  . Food insecurity:    Worry: Never true    Inability: Never true  . Transportation needs:    Medical: No    Non-medical: No  Tobacco Use  . Smoking status: Never Smoker  . Smokeless tobacco: Never Used  Substance and Sexual Activity  . Alcohol use: No  . Drug use: No  . Sexual activity: Not Currently    Birth control/protection: Surgical  Lifestyle  . Physical activity:    Days per week: 0 days    Minutes per session: 0 min  . Stress: Only a little  Relationships  . Social connections:    Talks on phone: More than three times a week    Gets together: More than three times a week    Attends religious service: More than 4 times per year    Active member of club or organization: Yes    Attends meetings of clubs or organizations: More than 4 times per year    Relationship status: Divorced  Other Topics Concern  . Not on file  Social History Narrative   Caffeine: rare   Lives alone   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[employer]   Fun/Hobbies: Gardening    Allergies  Allergen  Reactions  . Codeine Other (See Comments)    palpitations  . Metformin And Related Nausea Only  . Oxycodone Nausea And Vomiting   Family History  Problem Relation Age of Onset  . Cancer Maternal Grandmother 5       ovarian  . Diabetes Maternal Grandmother   . Hypertension Maternal Grandmother   . Cancer Mother 51       bone, MM  . Stroke Other        unsure who  . CAD Neg Hx   . Colon cancer Neg Hx   . Esophageal cancer Neg Hx   . Rectal cancer Neg Hx   . Stomach cancer Neg Hx     Current Outpatient Medications (Endocrine & Metabolic):  .  glipiZIDE (GLUCOTROL XL) 2.5 MG 24 hr tablet, Take 1 tablet (2.5 mg total) by mouth daily with breakfast.  Current Outpatient Medications (Cardiovascular):  .  hydrochlorothiazide (HYDRODIURIL) 25 MG tablet, TAKE 1 TABLET(25 MG) BY MOUTH DAILY .  lisinopril (PRINIVIL,ZESTRIL) 30 MG tablet, Take 1 tablet (30 mg total) by  mouth daily. .  pravastatin (PRAVACHOL) 40 MG tablet, TAKE 1 TABLET(40 MG) BY MOUTH DAILY   Current Outpatient Medications (Analgesics):  .  aspirin EC 81 MG tablet, Take 81 mg by mouth daily. .  IBUPROFEN PO, Take by mouth as needed. .  naproxen (NAPROSYN) 375 MG tablet, Take 1 tablet (375 mg total) by mouth 2 (two) times daily.   Current Outpatient Medications (Other):  [name]  ACCU-CHEK FASTCLIX LANCETS MISC, Use to test blood sugars up to four times daily. [name]  ACCU-CHEK GUIDE test strip, USE AS DIRECTED TO TEST BLOOD GLUCOSE UP TO FOUR TIMES DAILY .  ALPRAZolam (XANAX) 0.25 MG tablet, TAKE 1 TABLET(0.25 MG) BY MOUTH AT BEDTIME AS NEEDED FOR SLEEP .  blood glucose meter kit and supplies KIT, Dispense based on patient and insurance preference. Use up to four times daily as directed. DX Code: E11.9 .  dorzolamide-timolol (COSOPT) 22.3-6.8 MG/ML ophthalmic solution, Place 1 drop into both eyes 2 (two) times daily. .  mupirocin ointment (BACTROBAN) 2 %, Place 1 application into the nose 2 (two) times daily.    Past medical history, social, surgical and family history all reviewed in electronic medical record.  No pertanent information unless stated regarding to the chief complaint.   Review of Systems:  No headache, visual changes, nausea, vomiting, diarrhea, constipation, dizziness, abdominal pain, skin rash, fevers, chills, night sweats, weight loss, swollen lymph nodes, body aches, joint swelling,  chest pain, shortness of breath, mood changes.  Positive muscle aches  Objective  Blood pressure (!) 132/92, pulse 70, height _0  (1.676 m), weight 213 lb (96.6 kg), SpO2 97 %.    General: No apparent distress alert and oriented x3 mood and affect normal, dressed appropriately.  HEENT: Pupils equal, extraocular movements intact mild glaucoma Respiratory: Patient's speak in full sentences and does not appear short of breath  Cardiovascular: No lower extremity edema, non tender, no erythema    Skin: Warm dry intact with no signs of infection or rash on extremities or on axial skeleton.  Abdomen: Soft nontender  Neuro: Cranial nerves II through XII are intact, neurovascularly intact in all extremities with 2+ DTRs and 2+ pulses.  Lymph: No lymphadenopathy of posterior or anterior cervical chain or axillae bilaterally.  Gait antalgic favoring the right hip MSK:  Non tender with full range of motion and good stability and symmetric strength and tone of shoulders, elbows, wrist, ,  knee and ankles bilaterally.  Patient is wearing a postop shoe on the left foot. Right hand exam shows the patient does have triggering of the middle finger with flexion and extension.  Seems to be at the A2 pulley.  Severely tender at this area as well.  Otherwise fairly unremarkable  Limited musculoskeletal ultrasound was performed and interpreted by Lyndal Pulley  Limited ultrasound of patient's right flexor tendon sheath of the third finger shows chronic changes and debris in the area but does cause a snapping sensation over the joint on dynamic testing. Impression: Trigger finger    Impression and Recommendations:     This case required medical decision making of moderate complexity. The above documentation has been reviewed and is accurate and complete Lyndal Pulley, DO       Note: This dictation was prepared with Dragon dictation along with smaller phrase technology. Any transcriptional errors that result from this process are unintentional.

## 2018-11-12 ENCOUNTER — Encounter: Payer: Self-pay | Admitting: Family Medicine

## 2018-11-12 ENCOUNTER — Ambulatory Visit: Payer: Self-pay

## 2018-11-12 ENCOUNTER — Ambulatory Visit (INDEPENDENT_AMBULATORY_CARE_PROVIDER_SITE_OTHER): Payer: Medicare Other | Admitting: Family Medicine

## 2018-11-12 VITALS — BP 132/92 | HR 70 | Ht 66.0 in | Wt 213.0 lb

## 2018-11-12 DIAGNOSIS — M79644 Pain in right finger(s): Secondary | ICD-10-CM

## 2018-11-12 DIAGNOSIS — M65331 Trigger finger, right middle finger: Secondary | ICD-10-CM

## 2018-11-12 HISTORY — DX: Trigger finger, right middle finger: M65.331

## 2018-11-12 NOTE — Patient Instructions (Addendum)
Good to see you  Wear the brace at night pennsaid pinkie amount topically 2 times daily as needed.  Heat 10 minutes then massage the area See me again in 6-8 weeks

## 2018-11-12 NOTE — Assessment & Plan Note (Signed)
Trigger finger on the right hand.  Discussed with patient and different treatment options, patient is shows conservative therapy and will try bracing, massage, heated massage, and topical anti-inflammatories.  Worsening symptoms will consider injection.  Patient will be undergoing hip replacement in the near future and will see me after that.

## 2018-11-14 ENCOUNTER — Other Ambulatory Visit: Payer: Self-pay | Admitting: Nurse Practitioner

## 2018-11-18 DIAGNOSIS — Z01818 Encounter for other preprocedural examination: Secondary | ICD-10-CM | POA: Diagnosis not present

## 2018-11-18 DIAGNOSIS — Z01812 Encounter for preprocedural laboratory examination: Secondary | ICD-10-CM | POA: Diagnosis not present

## 2018-11-18 DIAGNOSIS — I498 Other specified cardiac arrhythmias: Secondary | ICD-10-CM | POA: Diagnosis not present

## 2018-11-18 DIAGNOSIS — M1612 Unilateral primary osteoarthritis, left hip: Secondary | ICD-10-CM | POA: Diagnosis not present

## 2018-11-18 DIAGNOSIS — Z01810 Encounter for preprocedural cardiovascular examination: Secondary | ICD-10-CM | POA: Diagnosis not present

## 2018-11-19 NOTE — Telephone Encounter (Signed)
Pt is calling and [facility] de[facility] is waiting for medical clearance to be fax . Pt has surgery schedule for 11-27-2018

## 2018-11-20 NOTE — Telephone Encounter (Signed)
As far as I know, I never actually received any surgical clearance forms to sign for this?

## 2018-11-21 NOTE — Telephone Encounter (Signed)
I called pt- She states her 11/27/18 surgery is now cancelled due to the Covid-19 pandemic. She states she is scheduled for a hip injection on 12/05/18.

## 2018-11-25 ENCOUNTER — Other Ambulatory Visit: Payer: Self-pay | Admitting: Nurse Practitioner

## 2018-12-04 ENCOUNTER — Ambulatory Visit: Payer: Self-pay

## 2018-12-04 NOTE — Telephone Encounter (Signed)
Pt with 3 week h/o facial swelling with small bumps that are filled with white on the top that crust over. Pt stated he is having burning or electrical pain to her face. Pt had a similar episode last year and was prescribed an antiviral and prednisone. Pt stated she is not SOB, or having difficulty breathing. Pt stated she is not having any tongue or throat swelling.  Last episode was 8/19. Pt stated after taking 2 doses of prednisone it was greatly improved. Pt informed to call 911 if having any difficulty swallowing or tongue swelling or difficulty breathing. Pt is trying to sign up on Mychart to send a photo of the rash and swelling. E-mail verified. Care advice given and pt verbalized understanding Routing to office to schedule visit.      Reason for Disposition . [1] Mild facial swelling (puffiness) AND [2] persists > 3 days  Answer Assessment - Initial Assessment Questions 1. ONSET: "When did the swelling start?" (e.g., minutes, hours, days)    3 weeks ago 2. LOCATION: "What part of the face is swollen?"     Around eyebrows, forehead, both temples cheeks- pimple that has crusted over, nose 3. SEVERITY: "How swollen is it?"    moderate 4. ITCHING: "Is there any itching?" If so, ask: "How much?"   (Scale 1-10; mild, moderate or severe)     no 5. PAIN: "Is the swelling painful to touch?" If so, ask: "How painful is it?"   (Scale 1-10; mild, moderate or severe)     Burns like a electric shock 6. FEVER: "Do you have a fever?" If so, ask: "What is it, how was it measured, and when did it start?"      no 7. CAUSE: "What do you think is causing the face swelling?"    Herpes zoster (chicken pox as a child) per MD that bug spray triggered 8. RECURRENT SYMPTOM: "Have you had face swelling before?" If so, ask: "When was the last time?" "What happened that time?"     2019- prednisone, ("purple" pill antiviral pill) 9. OTHER SYMPTOMS: "Do you have any other symptoms?" (e.g., toothache, leg  swelling)     no 10. PREGNANCY: "Is there any chance you are pregnant?" "When was your last menstrual period?"       n/a  Protocols used: [facility]

## 2018-12-05 NOTE — Telephone Encounter (Signed)
Pt cannot do a virtual visit, she would like prednisone pills called in, please advise

## 2018-12-06 ENCOUNTER — Ambulatory Visit: Payer: Medicare Other | Admitting: Family

## 2018-12-06 NOTE — Telephone Encounter (Signed)
Originally scheduled patient for phone conference but she will need to be evaluated. Tried to reach her 2x by phone but unable to get her. She can come to office today but must make appointment. Mickel Baas has agreed to do a parking lot visit with her as we are currently limiting patients from coming in the office as much as possible.

## 2018-12-06 NOTE — Telephone Encounter (Signed)
Spoke with patient today and cancelled appointment. I gave her the option of coming to office today to do a parking lot visit since she did not have access to technology to do virtual visit. She stated her brakes were out in her can and she would not be able to find a way up here. I did also give her the option to go to a minute clinic or urgent care of symptoms got worse. She agreed to plan and states she would call back once she was able to do visit if she had transportation or had gotten her an updated phone if she still needed to be seen.

## 2018-12-10 ENCOUNTER — Other Ambulatory Visit: Payer: Self-pay | Admitting: Endocrinology

## 2018-12-10 ENCOUNTER — Other Ambulatory Visit: Payer: Self-pay | Admitting: Nurse Practitioner

## 2018-12-10 NOTE — Telephone Encounter (Signed)
Requested medication (s) are due for refill today: yes  Requested medication (s) are on the active medication list: yes  Last refill:  Lisinopril previously filled on 10/18/18 #30 with 1 refill by Pricilla Holm and Glipizide last filled by Dr. Loanne Drilling  Future visit scheduled: No  Notes to clinic: Medications last filled by different providers. Glipizide last filled by Dr. Loanne Drilling and Lisinopril last filled by Ammie Dalton    Requested Prescriptions  Pending Prescriptions Disp Refills   glipiZIDE (GLUCOTROL XL) 2.5 MG 24 hr tablet 30 tablet 11    Sig: Take 1 tablet (2.5 mg total) by mouth daily with breakfast.     Endocrinology:  Diabetes - Sulfonylureas Failed - 12/10/2018  5:58 PM      Failed - HBA1C is between 0 and 7.9 and within 180 days    Hemoglobin A1C  Date Value Ref Range Status  04/17/2018 5.8 (A) 4.0 - 5.6 % Final   Hgb A1c MFr Bld  Date Value Ref Range Status  09/18/2017 6.2 4.6 - 6.5 % Final    Comment:    Glycemic Control Guidelines for People with Diabetes:Non Diabetic:  <6%Goal of Therapy: <7%Additional Action Suggested:  >8%          Passed - Valid encounter within last 6 months    Recent Outpatient Visits          4 weeks ago Finger pain, right   [place], [place], DO   2 months ago Pre-operative clearance   [place], Delphia Grates, NP   4 months ago Neck mass   [place], Delphia Grates, NP   9 months ago Essential hypertension   [place], Delphia Grates, NP   10 months ago Neck mass   [facility] [place], Delphia Grates, NP            lisinopril (PRINIVIL,ZESTRIL) 30 MG tablet 30 tablet 1    Sig: Take 1 tablet (30 mg total) by mouth daily.     Cardiovascular:  ACE Inhibitors Failed - 12/10/2018  5:58 PM      Failed - Last BP in normal range    BP Readings from Last 1 Encounters:   11/12/18 (!) 132/92         Passed - Cr in normal range and within 180 days    Creat  Date Value Ref Range Status  10/09/2012 0.70 0.50 - 1.10 mg/dL Final   Creatinine, Ser  Date Value Ref Range Status  11/06/2018 0.70 0.40 - 1.20 mg/dL Final         Passed - K in normal range and within 180 days    Potassium  Date Value Ref Range Status  11/06/2018 3.6 3.5 - 5.1 mEq/L Final         Passed - Patient is not pregnant      Passed - Valid encounter within last 6 months    Recent Outpatient Visits          4 weeks ago Finger pain, right   [place], [place], DO   2 months ago Pre-operative clearance   [place], Delphia Grates, NP   4 months ago Neck mass   [place], Delphia Grates, NP   9 months ago Essential hypertension   [place], Delphia Grates, NP  10 months ago Neck mass   [place], Delphia Grates, NP           2

## 2018-12-11 ENCOUNTER — Other Ambulatory Visit: Payer: Self-pay | Admitting: Endocrinology

## 2018-12-11 MED ORDER — LISINOPRIL 30 MG PO TABS: 30.0000 mg | ORAL_TABLET | Freq: Every day | ORAL | 2 refills | Status: DC

## 2018-12-11 NOTE — Telephone Encounter (Signed)
Lisinopril refill sent. See meds. Dr. Cordelia Pen office sent Glipizide today 12/11/18.

## 2019-01-06 ENCOUNTER — Telehealth: Payer: Self-pay | Admitting: Family

## 2019-01-06 NOTE — Telephone Encounter (Addendum)
Patient called to schedule an appointment for facial swelling that has appeared to go on for approx one month (see 12/04/2018 nurse triage note). She is unable to do virtual. Per Mickel Baas, she would like for her to be evaluated but would not like for her to come into the office and was going to go out to the parking lot. She was not able to schedule a visit until now due to her car being in the shop. She has finally got it back and is able to come in. Would you be willing to see patient in the office? She is also need a new PCP. Would you like to see her as a TOC as well?

## 2019-01-06 NOTE — Telephone Encounter (Signed)
Ok for OV and TOC, thanks

## 2019-01-07 ENCOUNTER — Ambulatory Visit (INDEPENDENT_AMBULATORY_CARE_PROVIDER_SITE_OTHER): Payer: Medicare Other | Admitting: Internal Medicine

## 2019-01-07 ENCOUNTER — Other Ambulatory Visit: Payer: Self-pay

## 2019-01-07 ENCOUNTER — Telehealth: Payer: Self-pay | Admitting: Internal Medicine

## 2019-01-07 ENCOUNTER — Encounter: Payer: Self-pay | Admitting: Internal Medicine

## 2019-01-07 VITALS — BP 128/88 | HR 76 | Temp 98.4°F | Ht 66.0 in | Wt 220.0 lb

## 2019-01-07 DIAGNOSIS — Z0001 Encounter for general adult medical examination with abnormal findings: Secondary | ICD-10-CM

## 2019-01-07 DIAGNOSIS — Z1159 Encounter for screening for other viral diseases: Secondary | ICD-10-CM

## 2019-01-07 DIAGNOSIS — E119 Type 2 diabetes mellitus without complications: Secondary | ICD-10-CM | POA: Diagnosis not present

## 2019-01-07 DIAGNOSIS — R519 Headache, unspecified: Secondary | ICD-10-CM

## 2019-01-07 DIAGNOSIS — R51 Headache: Secondary | ICD-10-CM

## 2019-01-07 DIAGNOSIS — Z0000 Encounter for general adult medical examination without abnormal findings: Secondary | ICD-10-CM | POA: Insufficient documentation

## 2019-01-07 HISTORY — DX: Headache, unspecified: R51.9

## 2019-01-07 MED ORDER — CARBAMAZEPINE 200 MG PO TABS: 200.0000 mg | ORAL_TABLET | Freq: Three times a day (TID) | ORAL | 5 refills | Status: DC

## 2019-01-07 MED ORDER — DORZOLAMIDE HCL-TIMOLOL MAL 2-0.5 % OP SOLN: 1.0000 [drp] | Freq: Two times a day (BID) | OPHTHALMIC | 1 refills | Status: DC

## 2019-01-07 NOTE — Addendum Note (Signed)
Addended by: Biagio Borg on: 01/07/2019 03:51 PM   Modules accepted: Orders

## 2019-01-07 NOTE — Assessment & Plan Note (Signed)
Also for sed rate but less likely arteritis it seems

## 2019-01-07 NOTE — Patient Instructions (Signed)
Please take all new medication as prescribed - the Tegretol medication for the face pain  Please continue all other medications as before, and refills have been done if requested.  Please have the pharmacy call with any other refills you may need.  Please continue your efforts at being more active, low cholesterol diet, and weight control.  You are otherwise up to date with prevention measures today.  Please keep your appointments with your specialists as you may have planned  Please go to the LAB in the Basement (turn left off the elevator) for the tests to be done today  You will be contacted by phone if any changes need to be made immediately.  Otherwise, you will receive a letter about your results with an explanation, but please check with MyChart first.  Please remember to sign up for MyChart if you have not done so, as this will be important to you in the future with finding out test results, communicating by private email, and scheduling acute appointments online when needed.  Please return in 6 months, or sooner if needed, with Lab testing done 3-5 days before

## 2019-01-07 NOTE — Assessment & Plan Note (Signed)

## 2019-01-07 NOTE — Telephone Encounter (Signed)
She said it is 44ml

## 2019-01-07 NOTE — Telephone Encounter (Signed)
Copied from [place] [id number]. Topic: Quick Communication - See Telephone Encounter >> Jan 07, 2019  3:09 PM Vernona Rieger wrote: CRM for notification. See Telephone encounter for: 01/07/19. Patient said that the name of her eye drops are " dorzolamide/timolol ". Dr [name] advised her that he would call her in some for her eyes since she can not get into her eye doctor. She is going to call back if their is a percentage for it. Thanks  [id number] [address] [place] [address] Phone: [phone] Fax: [phone]

## 2019-01-07 NOTE — Telephone Encounter (Signed)
Done erx 

## 2019-01-07 NOTE — Progress Notes (Signed)
Subjective:    Patient ID: Melissa [name], female    DOB: 1951-03-24, 68 y.o.   MRN: [id number]  HPI  Here for wellness and f/u;  Overall doing ok;  Pt denies Chest pain, worsening SOB, DOE, wheezing, orthopnea, PND, worsening LE edema, palpitations, dizziness or syncope.  Pt denies neurological change such as new headache, facial or extremity weakness.  Pt denies polydipsia, polyuria, or low sugar symptoms. Pt states overall good compliance with treatment and medications, good tolerability, and has been trying to follow appropriate diet.  Pt denies worsening depressive symptoms, suicidal ideation or panic. No fever, night sweats, wt loss, loss of appetite, or other constitutional symptoms.  Pt states good ability with ADL's, has low fall risk, home safety reviewed and adequate, no other significant changes in hearing or vision, and only occasionally active with exercise. Left hip THR was deferred until after the pandemic.  Gaining wt Also has c/o 1 yr onset right facial pain and dysesthetic type of pain with a sense of swelling and puffiness all worse at the right temple and lateral right eye area but also somewhat to mid and maybe lower right face as well, with some overlap of the nose.  Has seen multiple providers for this without satisfaction, pain is sometimes debiliatating.  Nsaid has helped some in the past but not recently.   Past Medical History:  Diagnosis Date  . Allergy   . Anxiety   . Arthritis    s/p R TKR  . Chest pain on exertion   . Cyst of left kidney 2015   by lumbar MRI pending renal US  . DDD (degenerative disc disease), lumbar 03/2014   severe L2-3 with L HNP with L2/3 nerve root impingement [name])  . Depression   . Depression with anxiety   . Diabetes type 2, controlled ([place]) 2011   borderline  . Dyspnea   . Glaucoma   . History of ulcer disease   . HLD (hyperlipidemia)    no meds taken  . Hypertension   . PONV (postoperative nausea and vomiting)   . Ulcer     Past Surgical History:  Procedure Laterality Date  . ABDOMINAL HYSTERECTOMY  2007   fibroids, heavy bleeding  . CATARACT EXTRACTION W/ INTRAOCULAR LENS IMPLANT Bilateral   . COLONOSCOPY  04/2017   TA, diverticulosis, rpt ? ([facility])  . GLAUCOMA SURGERY    . HAMMER TOE SURGERY Bilateral   . Right hip replacement    . TONSILLECTOMY    . TOTAL KNEE ARTHROPLASTY Right 2004   TKR (Dr. Eddie Dibbles with [facility])  . TOTAL KNEE REVISION Right 03/2014   Dr Al Corpus [facility]    reports that she has never smoked. She has never used smokeless tobacco. She reports that she does not drink alcohol or use drugs. family history includes Cancer (age of onset: 71) in her mother; Cancer (age of onset: 23) in her maternal grandmother; Diabetes in her maternal grandmother; Hypertension in her maternal grandmother; Stroke in an other family member. Allergies  Allergen Reactions  . Codeine Other (See Comments)    palpitations  . Metformin And Related Nausea Only  . Oxycodone Nausea And Vomiting   Current Outpatient Medications on File Prior to Visit  Medication Sig Dispense Refill  . Accu-Chek FastClix Lancets MISC USE TO TEST BLOOD SUGAR UP TO FOUR TIMES DAILY 306 each 1  . ACCU-CHEK GUIDE test strip USE AS DIRECTED TO TEST BLOOD GLUCOSE UP TO FOUR TIMES DAILY  100 each 3  . ALPRAZolam (XANAX) 0.25 MG tablet TAKE 1 TABLET(0.25 MG) BY MOUTH AT BEDTIME AS NEEDED FOR SLEEP 30 tablet 0  . aspirin EC 81 MG tablet Take 81 mg by mouth daily.    . blood glucose meter kit and supplies KIT Dispense based on patient and insurance preference. Use up to four times daily as directed. DX Code: E11.9 1 each 0  . glipiZIDE (GLUCOTROL XL) 2.5 MG 24 hr tablet TAKE 1 TABLET(2.5 MG) BY MOUTH DAILY WITH BREAKFAST 30 tablet 0  . hydrochlorothiazide (HYDRODIURIL) 25 MG tablet TAKE 1 TABLET(25 MG) BY MOUTH DAILY 90 tablet 1  . IBUPROFEN PO Take by mouth as needed.    [name] lisinopril (PRINIVIL,ZESTRIL) 30 MG tablet Take 1 tablet (30 mg total)  by mouth daily. Needs visit for future refills. 30 tablet 2  . mupirocin ointment (BACTROBAN) 2 % Place 1 application into the nose 2 (two) times daily. 22 g 0  . naproxen (NAPROSYN) 375 MG tablet Take 1 tablet (375 mg total) by mouth 2 (two) times daily. 14 tablet 0  . pravastatin (PRAVACHOL) 40 MG tablet TAKE 1 TABLET(40 MG) BY MOUTH DAILY 90 tablet 0   No current facility-administered medications on file prior to visit.    Review of Systems Constitutional: Negative for other unusual diaphoresis, sweats, appetite or weight changes HENT: Negative for other worsening hearing loss, ear pain, facial swelling, mouth sores or neck stiffness.   Eyes: Negative for other worsening pain, redness or other visual disturbance.  Respiratory: Negative for other stridor or swelling Cardiovascular: Negative for other palpitations or other chest pain  Gastrointestinal: Negative for worsening diarrhea or loose stools, blood in stool, distention or other pain Genitourinary: Negative for hematuria, flank pain or other change in urine volume.  Musculoskeletal: Negative for myalgias or other joint swelling.  Skin: Negative for other color change, or other wound or worsening drainage.  Neurological: Negative for other syncope or numbness. Hematological: Negative for other adenopathy or swelling Psychiatric/Behavioral: Negative for hallucinations, other worsening agitation, SI, self-injury, or new decreased concentration All other system neg per pt    Objective:   Physical Exam BP 128/88   Pulse 76   Temp 98.4 F (36.9 C) (Oral)   Ht _0  (1.676 m)   Wt 220 lb (99.8 kg)   SpO2 98%   BMI 35.51 kg/m  VS noted,  Constitutional: Pt is oriented to person, place, and time. Appears well-developed and well-nourished, in no significant distress and comfortable Head: Normocephalic and atraumatic  Eyes: Conjunctivae and EOM are normal. Pupils are equal, round, and reactive to light Right Ear: External ear normal  without discharge Left Ear: External ear normal without discharge Nose: Nose without discharge or deformity Mouth/Throat: Oropharynx is without other ulcerations and moist  Neck: Normal range of motion. Neck supple. No JVD present. No tracheal deviation present or significant neck LA or mass Cardiovascular: Normal rate, regular rhythm, normal heart sounds and intact distal pulses.   Pulmonary/Chest: WOB normal and breath sounds without rales or wheezing  Abdominal: Soft. Bowel sounds are normal. NT. No HSM  Musculoskeletal: Normal range of motion. Exhibits no edema Lymphadenopathy: Has no other cervical adenopathy.  Neurological: Pt is alert and oriented to person, place, and time. Pt has normal reflexes. No cranial nerve deficit. Motor grossly intact, Gait intact, has tender right temple and mid face with marked sensitivity Skin: Skin is warm and dry. No rash noted or new ulcerations, no observed facial swelling Psychiatric:  Has normal mood and affect. Behavior is normal without agitation No other exam findings Lab Results  Component Value Date   WBC 5.3 11/06/2018   HGB 11.8 (L) 11/06/2018   HCT 36.5 11/06/2018   PLT 311.0 11/06/2018   GLUCOSE 85 11/06/2018   CHOL 235 (H) 04/10/2018   TRIG 126.0 04/10/2018   HDL 54.70 04/10/2018   LDLDIRECT 183.0 09/18/2017   LDLCALC 155 (H) 04/10/2018   ALT 17 11/06/2018   AST 16 11/06/2018   NA 142 11/06/2018   K 3.6 11/06/2018   CL 106 11/06/2018   CREATININE 0.70 11/06/2018   BUN 15 11/06/2018   CO2 28 11/06/2018   TSH 1.49 02/09/2015   INR 1.01 01/06/2012   HGBA1C 5.8 (A) 04/17/2018   MICROALBUR 1.7 03/08/2017         Assessment & Plan:

## 2019-01-07 NOTE — Telephone Encounter (Signed)
Appointment scheduled.

## 2019-01-07 NOTE — Assessment & Plan Note (Signed)
stable overall by history and exam, recent data reviewed with pt, and pt to continue medical treatment as before,  to f/u any worsening symptoms or concerns, for a1c with albs

## 2019-01-07 NOTE — Assessment & Plan Note (Addendum)
Suspect likely trigeminal neuralgia, declines MRI for now or neurology referral, ok for tegretol asd,  to f/u any worsening symptoms or concerns  In addition to the time spent performing CPE, I spent an additional 25 minutes face to face,in which greater than 50% of this time was spent in counseling and coordination of care for patient's acute illness as documented, including the differential dx, treatment, further evaluation and other management of right facial pain, temple tender, and DM

## 2019-01-09 ENCOUNTER — Other Ambulatory Visit (INDEPENDENT_AMBULATORY_CARE_PROVIDER_SITE_OTHER): Payer: Medicare Other

## 2019-01-09 DIAGNOSIS — E119 Type 2 diabetes mellitus without complications: Secondary | ICD-10-CM | POA: Diagnosis not present

## 2019-01-09 DIAGNOSIS — Z0000 Encounter for general adult medical examination without abnormal findings: Secondary | ICD-10-CM | POA: Diagnosis not present

## 2019-01-09 DIAGNOSIS — R519 Headache, unspecified: Secondary | ICD-10-CM

## 2019-01-09 DIAGNOSIS — R51 Headache: Secondary | ICD-10-CM

## 2019-01-09 LAB — HEPATIC FUNCTION PANEL
ALT: 19 U/L (ref 0–35)
AST: 18 U/L (ref 0–37)
Albumin: 4.1 g/dL (ref 3.5–5.2)
Alkaline Phosphatase: 94 U/L (ref 39–117)
Bilirubin, Direct: 0 mg/dL (ref 0.0–0.3)
Total Bilirubin: 0.3 mg/dL (ref 0.2–1.2)
Total Protein: 7.7 g/dL (ref 6.0–8.3)

## 2019-01-09 LAB — CBC WITH DIFFERENTIAL/PLATELET
Basophils Absolute: 0.1 10*3/uL (ref 0.0–0.1)
Basophils Relative: 1.3 % (ref 0.0–3.0)
Eosinophils Absolute: 0.3 10*3/uL (ref 0.0–0.7)
Eosinophils Relative: 5.1 % — ABNORMAL HIGH (ref 0.0–5.0)
HCT: 37 % (ref 36.0–46.0)
Hemoglobin: 12.1 g/dL (ref 12.0–15.0)
Lymphocytes Relative: 31.9 % (ref 12.0–46.0)
Lymphs Abs: 1.8 10*3/uL (ref 0.7–4.0)
MCHC: 32.8 g/dL (ref 30.0–36.0)
MCV: 82.5 fl (ref 78.0–100.0)
Monocytes Absolute: 0.3 10*3/uL (ref 0.1–1.0)
Monocytes Relative: 6.3 % (ref 3.0–12.0)
Neutro Abs: 3.1 10*3/uL (ref 1.4–7.7)
Neutrophils Relative %: 55.4 % (ref 43.0–77.0)
Platelets: 311 10*3/uL (ref 150.0–400.0)
RBC: 4.48 Mil/uL (ref 3.87–5.11)
RDW: 15.1 % (ref 11.5–15.5)
WBC: 5.6 10*3/uL (ref 4.0–10.5)

## 2019-01-09 LAB — BASIC METABOLIC PANEL
BUN: 15 mg/dL (ref 6–23)
CO2: 28 mEq/L (ref 19–32)
Calcium: 9.4 mg/dL (ref 8.4–10.5)
Chloride: 103 mEq/L (ref 96–112)
Creatinine, Ser: 0.74 mg/dL (ref 0.40–1.20)
GFR: 94.35 mL/min (ref >60.00)
Glucose, Bld: 109 mg/dL — ABNORMAL HIGH (ref 70–99)
Potassium: 3.8 mEq/L (ref 3.5–5.1)
Sodium: 140 mEq/L (ref 135–145)

## 2019-01-09 LAB — URINALYSIS, ROUTINE W REFLEX MICROSCOPIC
Bilirubin Urine: NEGATIVE
Hgb urine dipstick: NEGATIVE
Ketones, ur: NEGATIVE
Nitrite: NEGATIVE
RBC / HPF: NONE SEEN (ref 0–?)
Specific Gravity, Urine: 1.025 (ref 1.000–1.030)
Total Protein, Urine: NEGATIVE
Urine Glucose: NEGATIVE
Urobilinogen, UA: 0.2 (ref 0.0–1.0)
pH: 5.5 (ref 5.0–8.0)

## 2019-01-09 LAB — LIPID PANEL
Cholesterol: 242 mg/dL — ABNORMAL HIGH (ref 0–200)
HDL: 61.8 mg/dL (ref >39.00)
LDL Cholesterol: 150 mg/dL — ABNORMAL HIGH (ref 0–99)
NonHDL: 180.02
Total CHOL/HDL Ratio: 4
Triglycerides: 151 mg/dL — ABNORMAL HIGH (ref 0.0–149.0)
VLDL: 30.2 mg/dL (ref 0.0–40.0)

## 2019-01-09 LAB — MICROALBUMIN / CREATININE URINE RATIO
Creatinine,U: 89.6 mg/dL
Microalb Creat Ratio: 1.4 mg/g (ref 0.0–30.0)
Microalb, Ur: 1.3 mg/dL (ref 0.0–1.9)

## 2019-01-09 LAB — SEDIMENTATION RATE: Sed Rate: 84 mm/hr — ABNORMAL HIGH (ref 0–30)

## 2019-01-09 LAB — HEMOGLOBIN A1C: Hgb A1c MFr Bld: 6.6 % — ABNORMAL HIGH (ref 4.6–6.5)

## 2019-01-09 LAB — TSH: TSH: 1.34 u[IU]/mL (ref 0.35–4.50)

## 2019-01-11 ENCOUNTER — Other Ambulatory Visit: Payer: Self-pay | Admitting: Internal Medicine

## 2019-01-11 ENCOUNTER — Encounter: Payer: Self-pay | Admitting: Internal Medicine

## 2019-01-11 DIAGNOSIS — R700 Elevated erythrocyte sedimentation rate: Secondary | ICD-10-CM

## 2019-01-11 DIAGNOSIS — R519 Headache, unspecified: Secondary | ICD-10-CM

## 2019-01-11 MED ORDER — PREDNISONE 10 MG PO TABS: 20.0000 mg | ORAL_TABLET | Freq: Every day | ORAL | 1 refills | Status: DC

## 2019-01-11 MED ORDER — ATORVASTATIN CALCIUM 40 MG PO TABS: 40.0000 mg | ORAL_TABLET | Freq: Every day | ORAL | 3 refills | Status: DC

## 2019-01-14 ENCOUNTER — Telehealth: Payer: Self-pay

## 2019-01-14 NOTE — Telephone Encounter (Signed)
Pt has been informed of results and expressed understanding.  °

## 2019-01-14 NOTE — Telephone Encounter (Signed)
-----   Message from Biagio Borg, MD sent at 01/11/2019 12:25 PM EDT ----- Left message on MyChart, pt to cont same tx except   The test results show that your current treatment is OK, except the LDL cholesterol is quite high, and does not appear to be controlled by the pravastatin.  Also the "sed rate" is quite high, and suggestive of inflammation, of which the right sided facial pain may be related.    We need to change your pravastatin to the more effective lipitor 40 mg per day.  Also, we need to start prednisone in case of temporal arteritis (which can lead to blindness) and refer to General Surg[facility] for further consideration.  I will send the prescriptions, and you should hear from the office about this as well.[name] to please inform pt, I will do referral and rx x 2

## 2019-01-15 ENCOUNTER — Telehealth: Payer: Self-pay

## 2019-01-15 NOTE — Telephone Encounter (Signed)
Sounds good and I signed the form today, to be faxed back, thanks

## 2019-01-15 NOTE — Telephone Encounter (Signed)
Copied from [place] [id number]. Topic: General - Other >> Jan 15, 2019 11:47 AM Percell Belt A wrote: Reason for CRM: pt called in and wanted Dr [name] to know that her Hip replacement surgery is going to be June 03 .  [facility] will be faxing over a surgical clearance form for Dr to file out.  This was sch March 25th and was cancelled and resch'd due to the covid   Best number  [phone]

## 2019-01-15 NOTE — Telephone Encounter (Signed)
FORM HAS BEEN FAXED

## 2019-01-17 ENCOUNTER — Other Ambulatory Visit: Payer: Self-pay | Admitting: Endocrinology

## 2019-01-18 ENCOUNTER — Other Ambulatory Visit: Payer: Self-pay | Admitting: Internal Medicine

## 2019-01-20 ENCOUNTER — Ambulatory Visit: Payer: Self-pay | Admitting: Surgery

## 2019-01-20 DIAGNOSIS — G4489 Other headache syndrome: Secondary | ICD-10-CM | POA: Diagnosis not present

## 2019-01-20 NOTE — H&P (Signed)
Melissa [name] Standard Documented: 01/20/2019 9:57 AM Location: [facility] Patient #: [id number] DOB: December 24, 1950 Single / Language: Melissa [name] / Race: Black or African American Female  History of Present Illness Melissa [name]; 01/20/2019 1:34 PM) The patient is a 68 year old female who presents with a complaint of Headaches. ` ` ` Patient sent for surgical consultation at the request of Dr Melissa [name]  Chief Complaint: Persistent right sided headaches. Rule out temporal arteritis ` ` The patient is a pleasant woman with numerous health issues. History of glaucoma that has been stable. She's had pain on the right side of her skull her temple from about the past year. Occasionally radiates around her eyes. She wondered if there is a rash third some point. She recalls being treated for shingles. However pain persisted. Had a recent sedimentation rate that was elevated. Concern for temporal arteritis. Biopsy requested by her primary care physician. There have been discussion about perhaps a neurology to rule out trigeminal neuralgia. However rolling off on a neurological consultation MRI for the patient's wishes. Patient denies any in the any vision loss or other changes. She does have some seasonal allergies for which she has a mild cough. Denies any cold exposure. No history of fall or trauma. She thought she had a lump underneath her right jaw such as an enlarged lymph node. She saw Dr. Redmond [name] with otolaryngology. Was skeptical that she had any major concerning lymphadenopathy. Observation recommended. That has not worsened.  (Review of systems as stated in this history (HPI) or in the review of systems. Otherwise all other 12 point ROS are negative) ` ` `   Allergies (Melissa [name], CMA; 01/20/2019 9:57 AM) Codeine Phosphate *ANALGESICS - OPIOID* MetFORMIN HCl *ANTIDIABETICS* Allergies Reconciled  Medication History (Melissa [name], CMA; 01/20/2019 9:58  AM) AmLODIPine Besylate (5MG  Tablet, Oral) Active. Atorvastatin Calcium (40MG  Tablet, Oral) Active. ALPRAZolam (0.25MG  Tablet, Oral as needed) Active. GlipiZIDE XL (5MG  Tablet ER 24HR, Oral) Active. Lisinopril-Hydrochlorothiazide (20-12.5MG  Tablet, Oral) Active. HydroCHLOROthiazide (12.5MG  Tablet, Oral) Active. Naproxen (500MG  Tablet, Oral) Active. Prolensa (0.07% Solution, Ophthalmic) Active. carBAMazepine (200MG  Tablet, Oral) Active. Celecoxib (200MG  Capsule, Oral) Active. Dorzolamide HCl-Timolol Mal (22.3-6.8MG /ML Solution, Ophthalmic) Active. glipiZIDE ER (2.5MG  Tablet ER 24HR, Oral) Active. predniSONE (10MG  Tablet, Oral) Active. Medications Reconciled    Vitals (Melissa [name] CMA; 01/20/2019 9:59 AM) 01/20/2019 9:58 AM Weight: 223.25 lb Height: 66in Body Surface Area: 2.1 m Body Mass Index: 36.03 kg/m  Temp.: 98.21F(Oral)  Pulse: 95 (Regular)  BP: 138/78 (Sitting, Left Arm, Standard)      Physical Exam Melissa [name]; 01/20/2019 1:32 PM)  General Mental Status-Alert. General Appearance-Not in acute distress, Not Sickly. Orientation-Oriented X3. Hydration-Well hydrated. Voice-Normal.  Integumentary Global Assessment Upon inspection and palpation of skin surfaces of the - Axillae: non-tender, no inflammation or ulceration, no drainage. and Distribution of scalp and body hair is normal. General Characteristics Temperature - normal warmth is noted.  Head and Neck Head-normocephalic, atraumatic with no lesions or palpable masses. Face Global Assessment - atraumatic, no absence of expression. Neck Global Assessment - no abnormal movements, no bruit auscultated on the right, no bruit auscultated on the left, no decreased range of motion, non-tender. Trachea-midline. Thyroid Gland Characteristics - non-tender. Note: Discomfort on right side of skull. No purulence or fluctuance. No evidence of any vesicular rash. No  erythema. No lymphadenopathy. Left side nontender.  Eye Eyeball - Left-Extraocular movements intact, No Nystagmus. Eyeball - Right-Extraocular movements intact, No Nystagmus. Cornea - Left-No Hazy. Cornea - Right-No  Hazy. Sclera/Conjunctiva - Left-No scleral icterus, No Discharge. Sclera/Conjunctiva - Right-No scleral icterus, No Discharge. Pupil - Left-Direct reaction to light normal. Pupil - Right-Direct reaction to light normal.  ENMT Ears Pinna - Left - no drainage observed, no generalized tenderness observed. Right - no drainage observed, no generalized tenderness observed. Nose and Sinuses External Inspection of the Nose - no destructive lesion observed. Inspection of the nares - Left - quiet respiration. Right - quiet respiration. Mouth and Throat Lips - Upper Lip - no fissures observed, no pallor noted. Lower Lip - no fissures observed, no pallor noted. Nasopharynx - no discharge present. Oral Cavity/Oropharynx - Tongue - no dryness observed. Oral Mucosa - no cyanosis observed. Hypopharynx - no evidence of airway distress observed.  Chest and Lung Exam Inspection Movements - Normal and Symmetrical. Accessory muscles - No use of accessory muscles in breathing. Palpation Palpation of the chest reveals - Non-tender. Auscultation Breath sounds - Normal and Clear.  Cardiovascular Auscultation Rhythm - Regular. Murmurs & Other Heart Sounds - Auscultation of the heart reveals - No Murmurs and No Systolic Clicks.  Abdomen Inspection Inspection of the abdomen reveals - No Visible peristalsis and No Abnormal pulsations. Umbilicus - No Bleeding, No Urine drainage. Palpation/Percussion Palpation and Percussion of the abdomen reveal - Soft, Non Tender, No Rebound tenderness, No Rigidity (guarding) and No Cutaneous hyperesthesia. Note: Abdomen soft. Not severely distended. No distasis recti. No umbilical or other anterior abdominal wall hernias  Female  Genitourinary Sexual Maturity Tanner 5 - Adult hair pattern.  Peripheral Vascular Upper Extremity Inspection - Left - No Cyanotic nailbeds, Not Ischemic. Right - No Cyanotic nailbeds, Not Ischemic.  Neurologic Neurologic evaluation reveals -normal attention span and ability to concentrate, able to name objects and repeat phrases. Appropriate fund of knowledge , normal sensation and normal coordination. Mental Status Affect - not angry, not paranoid. Cranial Nerves-Normal Bilaterally. Gait-Normal.  Neuropsychiatric Mental status exam performed with findings of-able to articulate well with normal speech/language, rate, volume and coherence, thought content normal with ability to perform basic computations and apply abstract reasoning and no evidence of hallucinations, delusions, obsessions or homicidal/suicidal ideation.  Musculoskeletal Global Assessment Spine, Ribs and Pelvis - no instability, subluxation or laxity. Right Upper Extremity - no instability, subluxation or laxity.  Lymphatic Head & Neck  General Head & Neck Lymphatics: Bilateral - Description - No Localized lymphadenopathy. Axillary  General Axillary Region: Bilateral - Description - No Localized lymphadenopathy. Femoral & Inguinal  Generalized Femoral & Inguinal Lymphatics: Left - Description - No Localized lymphadenopathy. Right - Description - No Localized lymphadenopathy.    Assessment & Plan Melissa [name]; 01/20/2019 1:30 PM)  OTHER HEADACHE SYNDROME (G44.89) Impression: Patient with right-sided temporal head pain of uncertain etiology. Not fully resolved with shingles treatment. Elevated sedimentation rate.  Request made for temporal artery biopsy to rule out arteritis given elevated sedimentation rate and persistent pain. Discussed technique. Outpatient surgery. Differential diagnosis discussed. She is interested in proceeding.  I'll defer further workup to her primary care physician.  Discussion of perhaps neurology evaluation and if this proves to be negative.  Defer planning of hip surgery next month with her orthopedic surgeon. He'll try and do the biopsy beforehand so that if she does not need to stay on steroids because she does not have temporal arteritis, she can minimize the use of perioperative steroids around the time of hip surgery.  Current Plans You are being scheduled for surgery- Our schedulers will call you.  You should  hear from our office's scheduling department within 5 working days about the location, date, and time of surgery. We try to make accommodations for patient's preferences in scheduling surgery, but sometimes the OR schedule or the surgeon's schedule prevents [place] from making those accommodations.  If you have not heard from our office [phone]) in 5 working days, call the office and ask for your surgeon's nurse.  If you have other questions about your diagnosis, plan, or surgery, call the office and ask for your surgeon's nurse.  The pathophysiology of temporal arteritis was discussed. Differential diagnosis is discussed. Recommendation has been made for an excision of a segment of the temporal artery near the front of the ear as an excisional biopsy for diagnostic purposes. We plan to do it on the same side where the patient is having the symptoms. Risks of bleeding, infection, nerve injury and other risks were discussed in detail. Questions were answered. The patient agrees to proceed  Melissa Hector, [name], FACS, MASCRS Gastrointestinal and Minimally Invasive Surgery    [address] [place], [place] [address] [phone] Main / Paging [phone] Fax

## 2019-01-20 NOTE — H&P (View-Only) (Signed)
Sukhmani Revonda Standard Documented: 01/20/2019 9:57 AM Location: [facility] Patient #: [id number] DOB: March 13, 1951 Single / Language: Cleophus Molt / Race: Black or African American Female  History of Present Illness [name] MD; 01/20/2019 1:34 PM) The patient is a 68 year old female who presents with a complaint of Headaches. ` ` ` Patient sent for surgical consultation at the request of Dr [name]  Chief Complaint: Persistent right sided headaches. Rule out temporal arteritis ` ` The patient is a pleasant woman with numerous health issues. History of glaucoma that has been stable. She's had pain on the right side of her skull her temple from about the past year. Occasionally radiates around her eyes. She wondered if there is a rash third some point. She recalls being treated for shingles. However pain persisted. Had a recent sedimentation rate that was elevated. Concern for temporal arteritis. Biopsy requested by her primary care physician. There have been discussion about perhaps a neurology to rule out trigeminal neuralgia. However rolling off on a neurological consultation MRI for the patient's wishes. Patient denies any in the any vision loss or other changes. She does have some seasonal allergies for which she has a mild cough. Denies any cold exposure. No history of fall or trauma. She thought she had a lump underneath her right jaw such as an enlarged lymph node. She saw Dr. [name] with otolaryngology. Was skeptical that she had any major concerning lymphadenopathy. Observation recommended. That has not worsened.  (Review of systems as stated in this history (HPI) or in the review of systems. Otherwise all other 12 point ROS are negative) ` ` `   Allergies ([name], CMA; 01/20/2019 9:57 AM) Codeine Phosphate *ANALGESICS - OPIOID* MetFORMIN HCl *ANTIDIABETICS* Allergies Reconciled  Medication History ([name], CMA; 01/20/2019 9:58  AM) AmLODIPine Besylate (5MG  Tablet, Oral) Active. Atorvastatin Calcium (40MG  Tablet, Oral) Active. ALPRAZolam (0.25MG  Tablet, Oral as needed) Active. GlipiZIDE XL (5MG  Tablet ER 24HR, Oral) Active. Lisinopril-Hydrochlorothiazide (20-12.5MG  Tablet, Oral) Active. HydroCHLOROthiazide (12.5MG  Tablet, Oral) Active. Naproxen (500MG  Tablet, Oral) Active. Prolensa (0.07% Solution, Ophthalmic) Active. carBAMazepine (200MG  Tablet, Oral) Active. Celecoxib (200MG  Capsule, Oral) Active. Dorzolamide HCl-Timolol Mal (22.3-6.8MG /ML Solution, Ophthalmic) Active. glipiZIDE ER (2.5MG  Tablet ER 24HR, Oral) Active. predniSONE (10MG  Tablet, Oral) Active. Medications Reconciled    Vitals ([name] CMA; 01/20/2019 9:59 AM) 01/20/2019 9:58 AM Weight: 223.25 lb Height: 66in Body Surface Area: 2.1 m Body Mass Index: 36.03 kg/m  Temp.: 98.38F(Oral)  Pulse: 95 (Regular)  BP: 138/78 (Sitting, Left Arm, Standard)      Physical Exam [name] MD; 01/20/2019 1:32 PM)  General Mental Status-Alert. General Appearance-Not in acute distress, Not Sickly. Orientation-Oriented X3. Hydration-Well hydrated. Voice-Normal.  Integumentary Global Assessment Upon inspection and palpation of skin surfaces of the - Axillae: non-tender, no inflammation or ulceration, no drainage. and Distribution of scalp and body hair is normal. General Characteristics Temperature - normal warmth is noted.  Head and Neck Head-normocephalic, atraumatic with no lesions or palpable masses. Face Global Assessment - atraumatic, no absence of expression. Neck Global Assessment - no abnormal movements, no bruit auscultated on the right, no bruit auscultated on the left, no decreased range of motion, non-tender. Trachea-midline. Thyroid Gland Characteristics - non-tender. Note: Discomfort on right side of skull. No purulence or fluctuance. No evidence of any vesicular rash. No  erythema. No lymphadenopathy. Left side nontender.  Eye Eyeball - Left-Extraocular movements intact, No Nystagmus. Eyeball - Right-Extraocular movements intact, No Nystagmus. Cornea - Left-No Hazy. Cornea - Right-No  Hazy. Sclera/Conjunctiva - Left-No scleral icterus, No Discharge. Sclera/Conjunctiva - Right-No scleral icterus, No Discharge. Pupil - Left-Direct reaction to light normal. Pupil - Right-Direct reaction to light normal.  ENMT Ears Pinna - Left - no drainage observed, no generalized tenderness observed. Right - no drainage observed, no generalized tenderness observed. Nose and Sinuses External Inspection of the Nose - no destructive lesion observed. Inspection of the nares - Left - quiet respiration. Right - quiet respiration. Mouth and Throat Lips - Upper Lip - no fissures observed, no pallor noted. Lower Lip - no fissures observed, no pallor noted. Nasopharynx - no discharge present. Oral Cavity/Oropharynx - Tongue - no dryness observed. Oral Mucosa - no cyanosis observed. Hypopharynx - no evidence of airway distress observed.  Chest and Lung Exam Inspection Movements - Normal and Symmetrical. Accessory muscles - No use of accessory muscles in breathing. Palpation Palpation of the chest reveals - Non-tender. Auscultation Breath sounds - Normal and Clear.  Cardiovascular Auscultation Rhythm - Regular. Murmurs & Other Heart Sounds - Auscultation of the heart reveals - No Murmurs and No Systolic Clicks.  Abdomen Inspection Inspection of the abdomen reveals - No Visible peristalsis and No Abnormal pulsations. Umbilicus - No Bleeding, No Urine drainage. Palpation/Percussion Palpation and Percussion of the abdomen reveal - Soft, Non Tender, No Rebound tenderness, No Rigidity (guarding) and No Cutaneous hyperesthesia. Note: Abdomen soft. Not severely distended. No distasis recti. No umbilical or other anterior abdominal wall hernias  Female  Genitourinary Sexual Maturity Tanner 5 - Adult hair pattern.  Peripheral Vascular Upper Extremity Inspection - Left - No Cyanotic nailbeds, Not Ischemic. Right - No Cyanotic nailbeds, Not Ischemic.  Neurologic Neurologic evaluation reveals -normal attention span and ability to concentrate, able to name objects and repeat phrases. Appropriate fund of knowledge , normal sensation and normal coordination. Mental Status Affect - not angry, not paranoid. Cranial Nerves-Normal Bilaterally. Gait-Normal.  Neuropsychiatric Mental status exam performed with findings of-able to articulate well with normal speech/language, rate, volume and coherence, thought content normal with ability to perform basic computations and apply abstract reasoning and no evidence of hallucinations, delusions, obsessions or homicidal/suicidal ideation.  Musculoskeletal Global Assessment Spine, Ribs and Pelvis - no instability, subluxation or laxity. Right Upper Extremity - no instability, subluxation or laxity.  Lymphatic Head & Neck  General Head & Neck Lymphatics: Bilateral - Description - No Localized lymphadenopathy. Axillary  General Axillary Region: Bilateral - Description - No Localized lymphadenopathy. Femoral & Inguinal  Generalized Femoral & Inguinal Lymphatics: Left - Description - No Localized lymphadenopathy. Right - Description - No Localized lymphadenopathy.    Assessment & Plan [name] MD; 01/20/2019 1:30 PM)  OTHER HEADACHE SYNDROME (G44.89) Impression: Patient with right-sided temporal head pain of uncertain etiology. Not fully resolved with shingles treatment. Elevated sedimentation rate.  Request made for temporal artery biopsy to rule out arteritis given elevated sedimentation rate and persistent pain. Discussed technique. Outpatient surgery. Differential diagnosis discussed. She is interested in proceeding.  I'll defer further workup to her primary care physician.  Discussion of perhaps neurology evaluation and if this proves to be negative.  Defer planning of hip surgery next month with her orthopedic surgeon. He'll try and do the biopsy beforehand so that if she does not need to stay on steroids because she does not have temporal arteritis, she can minimize the use of perioperative steroids around the time of hip surgery.  Current Plans You are being scheduled for surgery- Our schedulers will call you.  You should  hear from our office's scheduling department within 5 working days about the location, date, and time of surgery. We try to make accommodations for patient's preferences in scheduling surgery, but sometimes the OR schedule or the surgeon's schedule prevents [place] from making those accommodations.  If you have not heard from our office [phone]) in 5 working days, call the office and ask for your surgeon's nurse.  If you have other questions about your diagnosis, plan, or surgery, call the office and ask for your surgeon's nurse.  The pathophysiology of temporal arteritis was discussed. Differential diagnosis is discussed. Recommendation has been made for an excision of a segment of the temporal artery near the front of the ear as an excisional biopsy for diagnostic purposes. We plan to do it on the same side where the patient is having the symptoms. Risks of bleeding, infection, nerve injury and other risks were discussed in detail. Questions were answered. The patient agrees to proceed  [name], MD, FACS, MASCRS Gastrointestinal and Minimally Invasive Surgery    [address] [place], [place] [address] [phone] Main / Paging [phone] Fax

## 2019-01-21 ENCOUNTER — Other Ambulatory Visit (HOSPITAL_COMMUNITY)
Admission: RE | Admit: 2019-01-21 | Discharge: 2019-01-21 | Disposition: A | Discharge status: Home / Self Care | Payer: Medicare Other | Source: Ambulatory Visit | Attending: Surgery | Admitting: Surgery

## 2019-01-21 DIAGNOSIS — M1612 Unilateral primary osteoarthritis, left hip: Secondary | ICD-10-CM | POA: Diagnosis not present

## 2019-01-21 DIAGNOSIS — Z1159 Encounter for screening for other viral diseases: Secondary | ICD-10-CM | POA: Diagnosis not present

## 2019-01-21 NOTE — Pre-Procedure Instructions (Signed)
Melissa [name]  01/21/2019      [id number], [address] AT [address] [place] [address] Phone: [phone] Fax: [phone]    Your procedure is scheduled on 01-23-19 from 1430-1530  Report to [facility] at 1230 A.M.  Call this number if you have problems the morning of surgery:  [phone]   Remember:  Do not eat  after midnight.  You may drink clear liquids until 1130am .  Clear liquids allowed are:  Water, Juice (non-citric and without pulp), Carbonated beverages, Clear Tea, Black Coffee only, Plain Jell-O only, Gatorade and Plain Popsicles only    Take these medicines the morning of surgery with A SIP OF WATER :  Carbamazepine (TEGRETOL)             Prednisone(DELTASONE)             Cosopt ophthalmic solution              As needed: Omeprazole(Prilosec), Acetaminophen (TYLENOL)  As of today STOP taking Aleve, Naproxen, Ibuprofen, Motrin, Advil, Goody's, BC's, all herbal medications, fish oil, and all vitamins.   WHAT DO I DO ABOUT MY DIABETES MEDICATION?   [name] Do not take oral diabetes medicines (pills)GLIPIZIDE  the morning of surgery.   How to Manage Your Diabetes Before and After Surgery  Why is it important to control my blood sugar before and after surgery? . Improving blood sugar levels before and after surgery helps healing and can limit problems. . A way of improving blood sugar control is eating a healthy diet by: o  Eating less sugar and carbohydrates o  Increasing activity/exercise o  Talking with your doctor about reaching your blood sugar goals . High blood sugars (greater than 180 mg/dL) can raise your risk of infections and slow your recovery, so you will need to focus on controlling your diabetes during the weeks before surgery. . Make sure that the doctor who takes care of your diabetes knows about your planned surgery including  the date and location.  How do I manage my blood sugar before surgery? . Check your blood sugar at least 4 times a day, starting 2 days before surgery, to make sure that the level is not too high or low. o Check your blood sugar the morning of your surgery when you wake up and every 2 hours until you get to the Short Stay unit. . If your blood sugar is less than 70 mg/dL, you will need to treat for low blood sugar: o Do not take insulin. o Treat a low blood sugar (less than 70 mg/dL) with  cup of clear juice (cranberry or apple), 4 glucose tablets, OR glucose gel. o Recheck blood sugar in 15 minutes after treatment (to make sure it is greater than 70 mg/dL). If your blood sugar is not greater than 70 mg/dL on recheck, call [phone] for further instructions. . Report your blood sugar to the short stay nurse when you get to Short Stay.  . If you are admitted to the hospital after surgery: o Your blood sugar will be checked by the staff and you will probably be given insulin after surgery (instead of oral diabetes medicines) to make sure you have good blood sugar levels. o The goal for blood sugar control after surgery is 80-180 mg/dL.   Do not wear jewelry, make-up or nail  polish.  Do not wear lotions, powders, or perfumes, or deodorant.  Do not shave 48 hours prior to surgery.  Men may shave face and neck.  Do not bring valuables to the hospital.  [facility] is not responsible for any belongings or valuables.  Contacts, dentures or bridgework may not be worn into surgery.  Leave your suitcase in the car.  After surgery it may be brought to your room.  For patients admitted to the hospital, discharge time will be determined by your treatment team.  Patients discharged the day of surgery will not be allowed to drive home.  Special instructions:   [facility]- Preparing For Surgery  Before surgery, you can play an important role. Because skin is not sterile, your skin needs to be as  free of germs as possible. You can reduce the number of germs on your skin by washing with CHG (chlorahexidine gluconate) Soap before surgery.  CHG is an antiseptic cleaner which kills germs and bonds with the skin to continue killing germs even after washing.    Oral Hygiene is also important to reduce your risk of infection.  Remember - BRUSH YOUR TEETH THE MORNING OF SURGERY WITH YOUR REGULAR TOOTHPASTE  Please do not use if you have an allergy to CHG or antibacterial soaps. If your skin becomes reddened/irritated stop using the CHG.  Do not shave (including legs and underarms) for at least 48 hours prior to first CHG shower. It is OK to shave your face.  Please follow these instructions carefully.   1. Shower the NIGHT BEFORE SURGERY and the MORNING OF SURGERY with CHG.   2. If you chose to wash your hair, wash your hair first as usual with your normal shampoo.  3. After you shampoo, rinse your hair and body thoroughly to remove the shampoo.  4. Use CHG as you would any other liquid soap. You can apply CHG directly to the skin and wash gently with a scrungie or a clean washcloth.   5. Apply the CHG Soap to your body ONLY FROM THE NECK DOWN.  Do not use on open wounds or open sores. Avoid contact with your eyes, ears, mouth and genitals (private parts). Wash Face and genitals (private parts)  with your normal soap.  6. Wash thoroughly, paying special attention to the area where your surgery will be performed.  7. Thoroughly rinse your body with warm water from the neck down.  8. DO NOT shower/wash with your normal soap after using and rinsing off the CHG Soap.  9. Pat yourself dry with a CLEAN TOWEL.  10. Wear CLEAN PAJAMAS to bed the night before surgery, wear comfortable clothes the morning of surgery  11. Place CLEAN SHEETS on your bed the night of your first shower and DO NOT SLEEP WITH PETS.  Day of Surgery:  Do not apply any deodorants/lotions.  Please wear clean clothes to  the hospital/surgery center.   Remember to brush your teeth WITH YOUR REGULAR TOOTHPASTE.  Please read over the following fact sheets that you were given. Pain Booklet, Coughing and Deep Breathing, MRSA Information and Surgical Site Infection Prevention

## 2019-01-22 ENCOUNTER — Other Ambulatory Visit: Payer: Self-pay

## 2019-01-22 ENCOUNTER — Other Ambulatory Visit: Payer: Self-pay | Admitting: *Deleted

## 2019-01-22 ENCOUNTER — Encounter (HOSPITAL_COMMUNITY): Payer: Self-pay

## 2019-01-22 ENCOUNTER — Encounter (HOSPITAL_COMMUNITY)
Admission: RE | Admit: 2019-01-22 | Discharge: 2019-01-22 | Disposition: A | Discharge status: Home / Self Care | Payer: Medicare Other | Source: Ambulatory Visit | Attending: Surgery | Admitting: Surgery

## 2019-01-22 DIAGNOSIS — Z01818 Encounter for other preprocedural examination: Secondary | ICD-10-CM | POA: Insufficient documentation

## 2019-01-22 DIAGNOSIS — E119 Type 2 diabetes mellitus without complications: Secondary | ICD-10-CM | POA: Diagnosis not present

## 2019-01-22 DIAGNOSIS — F419 Anxiety disorder, unspecified: Secondary | ICD-10-CM | POA: Diagnosis not present

## 2019-01-22 DIAGNOSIS — Z96641 Presence of right artificial hip joint: Secondary | ICD-10-CM | POA: Diagnosis not present

## 2019-01-22 DIAGNOSIS — E7800 Pure hypercholesterolemia, unspecified: Secondary | ICD-10-CM | POA: Diagnosis not present

## 2019-01-22 DIAGNOSIS — K219 Gastro-esophageal reflux disease without esophagitis: Secondary | ICD-10-CM | POA: Diagnosis not present

## 2019-01-22 DIAGNOSIS — Z7952 Long term (current) use of systemic steroids: Secondary | ICD-10-CM | POA: Diagnosis not present

## 2019-01-22 DIAGNOSIS — E785 Hyperlipidemia, unspecified: Secondary | ICD-10-CM | POA: Diagnosis not present

## 2019-01-22 DIAGNOSIS — Z7984 Long term (current) use of oral hypoglycemic drugs: Secondary | ICD-10-CM | POA: Diagnosis not present

## 2019-01-22 DIAGNOSIS — Z96651 Presence of right artificial knee joint: Secondary | ICD-10-CM | POA: Diagnosis not present

## 2019-01-22 DIAGNOSIS — I10 Essential (primary) hypertension: Secondary | ICD-10-CM | POA: Insufficient documentation

## 2019-01-22 DIAGNOSIS — R51 Headache: Secondary | ICD-10-CM | POA: Diagnosis not present

## 2019-01-22 DIAGNOSIS — Z791 Long term (current) use of non-steroidal anti-inflammatories (NSAID): Secondary | ICD-10-CM | POA: Diagnosis not present

## 2019-01-22 DIAGNOSIS — H409 Unspecified glaucoma: Secondary | ICD-10-CM | POA: Diagnosis not present

## 2019-01-22 DIAGNOSIS — Z79899 Other long term (current) drug therapy: Secondary | ICD-10-CM | POA: Diagnosis not present

## 2019-01-22 DIAGNOSIS — Z7982 Long term (current) use of aspirin: Secondary | ICD-10-CM | POA: Diagnosis not present

## 2019-01-22 LAB — GLUCOSE, CAPILLARY: Glucose-Capillary: 99 mg/dL (ref 70–99)

## 2019-01-22 LAB — NOVEL CORONAVIRUS, NAA (HOSP ORDER, SEND-OUT TO REF LAB; TAT 18-24 HRS): SARS-CoV-2, NAA: NOT DETECTED

## 2019-01-22 MED ORDER — GLUCOSE BLOOD VI STRP: ORAL_STRIP | 3 refills | Status: DC

## 2019-01-22 NOTE — Progress Notes (Signed)
PCP - Cathlean Cower ([place]) Cardiologist - Dr. Martinique (pt stated she does not see cardiologist on regular basis)  Chest x-ray - N/A EKG - 01-22-19  DM - Type 2 Fasting Blood Sugar - 80-95  Aspirin Instructions: Follow surgeon's instructions  Anesthesia review: n/a  Patient denies shortness of breath, fever, cough and chest pain at PAT appointment   Patient verbalized understanding of instructions that were given to them at the PAT appointment. Patient was also instructed that they will need to review over the PAT instructions again at home before surgery.

## 2019-01-23 ENCOUNTER — Ambulatory Visit (HOSPITAL_COMMUNITY)
Admission: RE | Admit: 2019-01-23 | Discharge: 2019-01-23 | Disposition: A | Discharge status: Home / Self Care | Payer: Medicare Other | Attending: Surgery | Admitting: Surgery

## 2019-01-23 ENCOUNTER — Encounter (HOSPITAL_COMMUNITY)
Admission: RE | Disposition: A | Discharge status: Home / Self Care | Payer: Self-pay | Source: Home / Self Care | Attending: Surgery

## 2019-01-23 ENCOUNTER — Ambulatory Visit (HOSPITAL_COMMUNITY): Payer: Medicare Other | Admitting: Certified Registered Nurse Anesthetist

## 2019-01-23 ENCOUNTER — Ambulatory Visit (HOSPITAL_COMMUNITY): Payer: Medicare Other | Admitting: Physician Assistant

## 2019-01-23 ENCOUNTER — Encounter (HOSPITAL_COMMUNITY): Payer: Self-pay

## 2019-01-23 DIAGNOSIS — Z791 Long term (current) use of non-steroidal anti-inflammatories (NSAID): Secondary | ICD-10-CM | POA: Insufficient documentation

## 2019-01-23 DIAGNOSIS — R51 Headache: Secondary | ICD-10-CM | POA: Insufficient documentation

## 2019-01-23 DIAGNOSIS — Z96651 Presence of right artificial knee joint: Secondary | ICD-10-CM | POA: Diagnosis not present

## 2019-01-23 DIAGNOSIS — E7800 Pure hypercholesterolemia, unspecified: Secondary | ICD-10-CM | POA: Insufficient documentation

## 2019-01-23 DIAGNOSIS — F419 Anxiety disorder, unspecified: Secondary | ICD-10-CM | POA: Insufficient documentation

## 2019-01-23 DIAGNOSIS — H539 Unspecified visual disturbance: Secondary | ICD-10-CM | POA: Diagnosis not present

## 2019-01-23 DIAGNOSIS — I10 Essential (primary) hypertension: Secondary | ICD-10-CM | POA: Diagnosis not present

## 2019-01-23 DIAGNOSIS — H409 Unspecified glaucoma: Secondary | ICD-10-CM | POA: Diagnosis not present

## 2019-01-23 DIAGNOSIS — Z7982 Long term (current) use of aspirin: Secondary | ICD-10-CM | POA: Diagnosis not present

## 2019-01-23 DIAGNOSIS — Z7952 Long term (current) use of systemic steroids: Secondary | ICD-10-CM | POA: Insufficient documentation

## 2019-01-23 DIAGNOSIS — Z7984 Long term (current) use of oral hypoglycemic drugs: Secondary | ICD-10-CM | POA: Insufficient documentation

## 2019-01-23 DIAGNOSIS — E785 Hyperlipidemia, unspecified: Secondary | ICD-10-CM | POA: Insufficient documentation

## 2019-01-23 DIAGNOSIS — K219 Gastro-esophageal reflux disease without esophagitis: Secondary | ICD-10-CM | POA: Diagnosis not present

## 2019-01-23 DIAGNOSIS — E119 Type 2 diabetes mellitus without complications: Secondary | ICD-10-CM | POA: Diagnosis not present

## 2019-01-23 DIAGNOSIS — Z96641 Presence of right artificial hip joint: Secondary | ICD-10-CM | POA: Diagnosis not present

## 2019-01-23 DIAGNOSIS — Z79899 Other long term (current) drug therapy: Secondary | ICD-10-CM | POA: Diagnosis not present

## 2019-01-23 DIAGNOSIS — R519 Headache, unspecified: Secondary | ICD-10-CM

## 2019-01-23 HISTORY — PX: ARTERY BIOPSY: SHX891

## 2019-01-23 LAB — GLUCOSE, CAPILLARY
Glucose-Capillary: 114 mg/dL — ABNORMAL HIGH (ref 70–99)
Glucose-Capillary: 106 mg/dL — ABNORMAL HIGH (ref 70–99)

## 2019-01-23 SURGERY — BIOPSY TEMPORAL ARTERY
Anesthesia: General | Site: Face | Laterality: Right

## 2019-01-23 MED ORDER — PHENYLEPHRINE 40 MCG/ML (10ML) SYRINGE FOR IV PUSH (FOR BLOOD PRESSURE SUPPORT)
PREFILLED_SYRINGE | INTRAVENOUS | Status: DC | PRN
  Administered 2019-01-23: 80 ug via INTRAVENOUS

## 2019-01-23 MED ORDER — 0.9 % SODIUM CHLORIDE (POUR BTL) OPTIME
TOPICAL | Status: DC | PRN
  Administered 2019-01-23: 1000 mL

## 2019-01-23 MED ORDER — BUPIVACAINE-EPINEPHRINE (PF) 0.25% -1:200000 IJ SOLN
INTRAMUSCULAR | Status: AC
  Filled 2019-01-23: qty 30

## 2019-01-23 MED ORDER — DEXAMETHASONE SODIUM PHOSPHATE 10 MG/ML IJ SOLN
INTRAMUSCULAR | Status: DC | PRN
  Administered 2019-01-23: 10 mg via INTRAVENOUS

## 2019-01-23 MED ORDER — CHLORHEXIDINE GLUCONATE CLOTH 2 % EX PADS: 6.0000 | MEDICATED_PAD | Freq: Once | CUTANEOUS | Status: DC

## 2019-01-23 MED ORDER — CEFAZOLIN SODIUM-DEXTROSE 2-4 GM/100ML-% IV SOLN
INTRAVENOUS | Status: AC
  Filled 2019-01-23: qty 100

## 2019-01-23 MED ORDER — LIDOCAINE 2% (20 MG/ML) 5 ML SYRINGE
INTRAMUSCULAR | Status: DC | PRN
  Administered 2019-01-23: 40 mg via INTRAVENOUS

## 2019-01-23 MED ORDER — FENTANYL CITRATE (PF) 100 MCG/2ML IJ SOLN: 25.0000 ug | INTRAMUSCULAR | Status: DC | PRN

## 2019-01-23 MED ORDER — DEXAMETHASONE SODIUM PHOSPHATE 10 MG/ML IJ SOLN
INTRAMUSCULAR | Status: AC
  Filled 2019-01-23: qty 1

## 2019-01-23 MED ORDER — BUPIVACAINE-EPINEPHRINE 0.25% -1:200000 IJ SOLN
INTRAMUSCULAR | Status: DC | PRN
  Administered 2019-01-23: 10 mL

## 2019-01-23 MED ORDER — LIDOCAINE 2% (20 MG/ML) 5 ML SYRINGE
INTRAMUSCULAR | Status: AC
  Filled 2019-01-23: qty 5

## 2019-01-23 MED ORDER — GABAPENTIN 300 MG PO CAPS
300.0000 mg | ORAL_CAPSULE | ORAL | Status: AC
  Administered 2019-01-23: 300 mg via ORAL

## 2019-01-23 MED ORDER — PROPOFOL 10 MG/ML IV BOLUS
INTRAVENOUS | Status: DC | PRN
  Administered 2019-01-23: 150 mg via INTRAVENOUS

## 2019-01-23 MED ORDER — PROPOFOL 10 MG/ML IV BOLUS
INTRAVENOUS | Status: AC
  Filled 2019-01-23: qty 20

## 2019-01-23 MED ORDER — ACETAMINOPHEN 500 MG PO TABS
ORAL_TABLET | ORAL | Status: AC
  Administered 2019-01-23: 1000 mg via ORAL
  Filled 2019-01-23: qty 2

## 2019-01-23 MED ORDER — FENTANYL CITRATE (PF) 250 MCG/5ML IJ SOLN
INTRAMUSCULAR | Status: AC
  Filled 2019-01-23: qty 5

## 2019-01-23 MED ORDER — MIDAZOLAM HCL 2 MG/2ML IJ SOLN
INTRAMUSCULAR | Status: DC | PRN
  Administered 2019-01-23: 2 mg via INTRAVENOUS

## 2019-01-23 MED ORDER — MIDAZOLAM HCL 2 MG/2ML IJ SOLN
INTRAMUSCULAR | Status: AC
  Filled 2019-01-23: qty 2

## 2019-01-23 MED ORDER — ACETAMINOPHEN 500 MG PO TABS
1000.0000 mg | ORAL_TABLET | ORAL | Status: AC
  Administered 2019-01-23: 1000 mg via ORAL

## 2019-01-23 MED ORDER — FENTANYL CITRATE (PF) 250 MCG/5ML IJ SOLN
INTRAMUSCULAR | Status: DC | PRN
  Administered 2019-01-23 (×4): 25 ug via INTRAVENOUS

## 2019-01-23 MED ORDER — LACTATED RINGERS IV SOLN
INTRAVENOUS | Status: DC | PRN
  Administered 2019-01-23: 07:00:00 via INTRAVENOUS

## 2019-01-23 MED ORDER — GABAPENTIN 300 MG PO CAPS
ORAL_CAPSULE | ORAL | Status: AC
  Administered 2019-01-23: 300 mg via ORAL
  Filled 2019-01-23: qty 1

## 2019-01-23 MED ORDER — PHENYLEPHRINE 40 MCG/ML (10ML) SYRINGE FOR IV PUSH (FOR BLOOD PRESSURE SUPPORT)
PREFILLED_SYRINGE | INTRAVENOUS | Status: AC
  Filled 2019-01-23: qty 10

## 2019-01-23 MED ORDER — LACTATED RINGERS IV SOLN
INTRAVENOUS | Status: DC
  Administered 2019-01-23: 07:00:00 via INTRAVENOUS

## 2019-01-23 MED ORDER — CEFAZOLIN SODIUM-DEXTROSE 2-4 GM/100ML-% IV SOLN
2.0000 g | INTRAVENOUS | Status: AC
  Administered 2019-01-23: 2 g via INTRAVENOUS

## 2019-01-23 MED ORDER — ONDANSETRON HCL 4 MG/2ML IJ SOLN
INTRAMUSCULAR | Status: DC | PRN
  Administered 2019-01-23: 4 mg via INTRAVENOUS

## 2019-01-23 MED ORDER — ONDANSETRON HCL 4 MG/2ML IJ SOLN: 4.0000 mg | Freq: Once | INTRAMUSCULAR | Status: DC | PRN

## 2019-01-23 MED ORDER — ONDANSETRON HCL 4 MG/2ML IJ SOLN
INTRAMUSCULAR | Status: AC
  Filled 2019-01-23: qty 2

## 2019-01-23 SURGICAL SUPPLY — 38 items
APPLIER CLIP 9.375 SM OPEN (CLIP)
APR CLP SM 9.3 20 MLT OPN (CLIP)
CLIP APPLIE 9.375 SM OPEN (CLIP) IMPLANT
COVER SURGICAL LIGHT HANDLE (MISCELLANEOUS) ×2 IMPLANT
COVER WAND RF STERILE (DRAPES) ×1 IMPLANT
CRADLE DONUT ADULT HEAD (MISCELLANEOUS) ×2 IMPLANT
DECANTER SPIKE VIAL GLASS SM (MISCELLANEOUS) ×2 IMPLANT
DRAPE LAPAROTOMY 100X72 PEDS (DRAPES) ×2 IMPLANT
DRSG TELFA 3X8 NADH (GAUZE/BANDAGES/DRESSINGS) ×2 IMPLANT
ELECT REM PT RETURN 9FT ADLT (ELECTROSURGICAL) ×2
ELECTRODE REM PT RTRN 9FT ADLT (ELECTROSURGICAL) ×1 IMPLANT
GAUZE 4X4 16PLY RFD (DISPOSABLE) ×1 IMPLANT
GAUZE SPONGE 2X2 8PLY STRL LF (GAUZE/BANDAGES/DRESSINGS) ×1 IMPLANT
GEL ULTRASOUND 20GR AQUASONIC (MISCELLANEOUS) ×1 IMPLANT
GLOVE ECLIPSE 8.0 STRL XLNG CF (GLOVE) ×2 IMPLANT
GLOVE INDICATOR 8.0 STRL GRN (GLOVE) ×2 IMPLANT
GOWN STRL REUS W/ TWL LRG LVL3 (GOWN DISPOSABLE) ×1 IMPLANT
GOWN STRL REUS W/ TWL XL LVL3 (GOWN DISPOSABLE) ×1 IMPLANT
GOWN STRL REUS W/TWL LRG LVL3 (GOWN DISPOSABLE) ×2
GOWN STRL REUS W/TWL XL LVL3 (GOWN DISPOSABLE) ×2
KIT BASIN OR (CUSTOM PROCEDURE TRAY) ×2 IMPLANT
KIT TURNOVER KIT B (KITS) ×2 IMPLANT
NEEDLE 22X1 1/2 (OR ONLY) (NEEDLE) ×2 IMPLANT
NS IRRIG 1000ML POUR BTL (IV SOLUTION) ×2 IMPLANT
PACK GENERAL/GYN (CUSTOM PROCEDURE TRAY) ×2 IMPLANT
PAD ARMBOARD 7.5X6 YLW CONV (MISCELLANEOUS) ×2 IMPLANT
PAD DRESSING TELFA 3X8 NADH (GAUZE/BANDAGES/DRESSINGS) ×1 IMPLANT
PENCIL SMOKE EVACUATOR (MISCELLANEOUS) ×2 IMPLANT
SPECIMEN JAR SMALL (MISCELLANEOUS) ×2 IMPLANT
SPONGE GAUZE 2X2 STER 10/PKG (GAUZE/BANDAGES/DRESSINGS)
STRIP CLOSURE SKIN 1/2X4 (GAUZE/BANDAGES/DRESSINGS) ×2 IMPLANT
SUT MNCRL AB 4-0 PS2 18 (SUTURE) ×2 IMPLANT
SUT SILK 3 0 (SUTURE) ×2
SUT SILK 3-0 18XBRD TIE 12 (SUTURE) ×1 IMPLANT
SUT VIC AB 3-0 SH 18 (SUTURE) ×2 IMPLANT
SYR CONTROL 10ML LL (SYRINGE) ×2 IMPLANT
TOWEL OR 17X24 6PK STRL BLUE (TOWEL DISPOSABLE) ×2 IMPLANT
TOWEL OR 17X26 10 PK STRL BLUE (TOWEL DISPOSABLE) ×2 IMPLANT

## 2019-01-23 NOTE — Anesthesia Postprocedure Evaluation (Signed)
Anesthesia Post Note  Patient: Melissa [name]  Procedure(s) Performed: REMOVAL OF PART OF RIGHT TEMPORAL ARTERY FOR BIOPSY (Right Face)     Patient location during evaluation: PACU Anesthesia Type: General Level of consciousness: awake and alert Pain management: pain level controlled Vital Signs Assessment: post-procedure vital signs reviewed and stable Respiratory status: spontaneous breathing, nonlabored ventilation, respiratory function stable and patient connected to nasal cannula oxygen Cardiovascular status: blood pressure returned to baseline and stable Postop Assessment: no apparent nausea or vomiting Anesthetic complications: no    Last Vitals:  Vitals:   01/23/19 1005 01/23/19 1020  BP: (!) 156/99 (!) 162/95  Pulse: 73 75  Resp: 16 16  Temp: (!) 36.2 C   SpO2: 97% 95%    Last Pain:  Vitals:   01/23/19 0701  TempSrc:   PainSc: 0-No pain                 Airen Dales COKER

## 2019-01-23 NOTE — Discharge Instructions (Signed)
GENERAL SURGERY: POST OP INSTRUCTIONS  ######################################################################  EAT Gradually transition to a high fiber diet with a fiber supplement over the next few weeks after discharge.  Start with a pureed / full liquid diet (see below)  WALK Walk an hour a day.  Control your pain to do that.    CONTROL PAIN Control pain so that you can walk, sleep, tolerate sneezing/coughing, go up/down stairs.  HAVE A BOWEL MOVEMENT DAILY Keep your bowels regular to avoid problems.  OK to try a laxative to override constipation.  OK to use an antidairrheal to slow down diarrhea.  Call if not better after 2 tries  CALL IF YOU HAVE PROBLEMS/CONCERNS Call if you are still struggling despite following these instructions. Call if you have concerns not answered by these instructions  ######################################################################    1. DIET: Follow a light bland diet the first 24 hours after arrival home, such as soup, liquids, crackers, etc.  Be sure to include lots of fluids daily.  Avoid fast food or heavy meals as your are more likely to get nauseated.   2. Take your usually prescribed home medications unless otherwise directed. 3. PAIN CONTROL: a. Pain is best controlled by a usual combination of three different methods TOGETHER: i. Ice/Heat ii. Over the counter pain medication iii. Prescription pain medication b. Most patients will experience some swelling and bruising around the incisions.  Ice packs or heating pads (30-60 minutes up to 6 times a day) will help. Use ice for the first few days to help decrease swelling and bruising, then switch to heat to help relax tight/sore spots and speed recovery.  Some people prefer to use ice alone, heat alone, alternating between ice & heat.  Experiment to what works for you.  Swelling and bruising can take several weeks to resolve.   c. It is helpful to take an over-the-counter pain medication  regularly for the first few weeks.  Choose one of the following that works best for you: i. Naproxen (Aleve, etc)  Two 220mg  tabs twice a day ii. Ibuprofen (Advil, etc) Three 200mg  tabs four times a day (every meal & bedtime) iii. Acetaminophen (Tylenol, etc) 500-650mg  four times a day (every meal & bedtime) d. A  prescription for pain medication (such as oxycodone, hydrocodone, etc) should be given to you upon discharge.  Take your pain medication as prescribed.  i. If you are having problems/concerns with the prescription medicine (does not control pain, nausea, vomiting, rash, itching, etc), please call us [phone] to see if we need to switch you to a different pain medicine that will work better for you and/or control your side effect better. ii. If you need a refill on your pain medication, please contact your pharmacy.  They will contact our office to request authorization. Prescriptions will not be filled after 5 pm or on week-ends. 4. Avoid getting constipated.  Between the surgery and the pain medications, it is common to experience some constipation.  Increasing fluid intake and taking a fiber supplement (such as Metamucil, Citrucel, FiberCon, MiraLax, etc) 1-2 times a day regularly will usually help prevent this problem from occurring.  A mild laxative (prune juice, Milk of Magnesia, MiraLax, etc) should be taken according to package directions if there are no bowel movements after 48 hours.   5.  6. Wash / shower every day.  You may shower over the dressings as they are waterproof.  Continue to shower over incision(s) after the dressing is off. 7.  8.  Remove your waterproof bandages 5 days after surgery.  You may leave the incision open to air.  You may have skin tapes (Steri Strips) covering the incision(s).  Leave them on until one week, then remove.  You may replace a dressing/Band-Aid to cover the incision for comfort if you wish.      9. ACTIVITIES as tolerated:   a. You may  resume regular (light) daily activities beginning the next day--such as daily self-care, walking, climbing stairs--gradually increasing activities as tolerated.  If you can walk 30 minutes without difficulty, it is safe to try more intense activity such as jogging, treadmill, bicycling, low-impact aerobics, swimming, etc. b. Save the most intensive and strenuous activity for last such as sit-ups, heavy lifting, contact sports, etc  Refrain from any heavy lifting or straining until you are off narcotics for pain control.   c. DO NOT PUSH THROUGH PAIN.  Let pain be your guide: If it hurts to do something, don't do it.  Pain is your body warning you to avoid that activity for another week until the pain goes down. d. You may drive when you are no longer taking prescription pain medication, you can comfortably wear a seatbelt, and you can safely maneuver your car and apply brakes. e. Dennis Bast may have sexual intercourse when it is comfortable.  10. FOLLOW UP in our office a. Please call [facility] at [phone] to set up an appointment to see your surgeon in the office for a follow-up appointment approximately 2-3 weeks after your surgery. b. Make sure that you call for this appointment the day you arrive home to insure a convenient appointment time. 9. IF YOU HAVE DISABILITY OR FAMILY LEAVE FORMS, BRING THEM TO THE OFFICE FOR PROCESSING.  DO NOT GIVE THEM TO YOUR DOCTOR.   WHEN TO CALL us [phone]: 1. Poor pain control 2. Reactions / problems with new medications (rash/itching, nausea, etc)  3. Fever over 101.5 F (38.5 C) 4. Worsening swelling or bruising 5. Continued bleeding from incision. 6. Increased pain, redness, or drainage from the incision 7. Difficulty breathing / swallowing   The clinic staff is available to answer your questions during regular business hours (8:30am-5pm).  Please dont hesitate to call and ask to speak to one of our nurses for clinical concerns.   If you have a medical  emergency, go to the nearest emergency room or call 911.  A surgeon from [facility] is always on call at the [facility], [address], [place], [place], [place]  [address] ? MAIN: [phone] ? TOLL FREE: [phone] ?  FAX [phone] [email or web address]

## 2019-01-23 NOTE — Interval H&P Note (Signed)
History and Physical Interval Note:  01/23/2019 8:18 AM  Melissa [name]  has presented today for surgery, with the diagnosis of HEADACHES, VISION CHANGES, POSSIBLE TEMPORAL ARTERITIS.  The various methods of treatment have been discussed with the patient and family. After consideration of risks, benefits and other options for treatment, the patient has consented to  Procedure(s): REMOVAL OF PART OF RIGHT TEMPORAL ARTERY FOR BIOPSY (Right) as a surgical intervention.  The patient's history has been reviewed, patient examined, no change in status, stable for surgery.  I have reviewed the patient's chart and labs.  Questions were answered to the patient's satisfaction.    I have re-reviewed the the patient's records, history, medications, and allergies.  I have re-examined the patient.  I again discussed intraoperative plans and goals of post-operative recovery.  The patient agrees to proceed.  Melissa [name]  1951-07-02 [id number]  Patient Care Team: Biagio Borg, MD as PCP - General (Internal Medicine) Melida Quitter, MD as Consulting Physician (Otolaryngology) Avie Echevaria., MD as Consulting Physician (Sports Medicine)  Patient Active Problem List   Diagnosis Date Noted  . Encounter for well adult exam with abnormal findings 01/07/2019  . Right-sided face pain 01/07/2019  . Temple tenderness 01/07/2019  . Trigger middle finger of right hand 11/12/2018  . Spondylosis of cervical region without myelopathy or radiculopathy 01/14/2018  . Chronic left shoulder pain 01/08/2018  . Snoring 09/14/2017  . History of total right hip replacement 07/19/2017  . Rotator cuff tear 02/02/2017  . Acquired renal cyst of left kidney 01/01/2016  . Osteoarthritis of spine with radiculopathy, 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  . DDD (degenerative disc disease), lumbar 03/04/2014  . Lumbar facet joint pain 02/24/2014  . Plantar fasciitis of left foot 10/01/2013  . IBS (irritable bowel syndrome) 05/13/2013   . Hammer toe, acquired 02/11/2013  . Metatarsalgia of both feet 02/11/2013  . Obesity 11/13/2012  . Glaucoma   . Type 2 diabetes mellitus, controlled ([place])   . Anxiety and depression   . Vitreous degeneration, unspecified eye 10/25/2012  . HTN (hypertension) 01/06/2012  . GERD (gastroesophageal reflux disease) 01/06/2012  . Hypercholesteremia 01/06/2012    Past Medical History:  Diagnosis Date  . Allergy   . Anxiety   . Arthritis    s/p R TKR  . Cyst of left kidney 2015   by lumbar MRI pending renal US  . DDD (degenerative disc disease), lumbar 03/2014   severe L2-3 with L HNP with L2/3 nerve root impingement [name])  . Depression   . Depression with anxiety   . Diabetes type 2, controlled ([place]) 2011   borderline  . Dyspnea   . Glaucoma   . History of ulcer disease   . HLD (hyperlipidemia)    no meds taken  . Hypertension   . PONV (postoperative nausea and vomiting)   . Ulcer     Past Surgical History:  Procedure Laterality Date  . ABDOMINAL HYSTERECTOMY  2007   fibroids, heavy bleeding  . CATARACT EXTRACTION W/ INTRAOCULAR LENS IMPLANT Bilateral   . COLONOSCOPY  04/2017   TA, diverticulosis, rpt ? ([facility])  . GLAUCOMA SURGERY    . HAMMER TOE SURGERY Bilateral   . Right hip replacement    . TONSILLECTOMY    . TOTAL KNEE ARTHROPLASTY Right 2004   TKR (Dr. Eddie Dibbles with [facility])  . TOTAL KNEE REVISION Right 03/2014   Dr Al Corpus [facility]    Social History   Socioeconomic History  .  Marital status: Divorced    Spouse name: Not on file  . Number of children: 0  . Years of education: 86  . Highest education level: Not on file  Occupational History  . Occupation: work with disabilities    Employer: [employer]  . Financial resource strain: Not hard at all  . Food insecurity:    Worry: Never true    Inability: Never true  . Transportation needs:    Medical: No    Non-medical: No  Tobacco Use  . Smoking status: Never Smoker  . Smokeless  tobacco: Never Used  Substance and Sexual Activity  . Alcohol use: No  . Drug use: No  . Sexual activity: Not Currently    Birth control/protection: Surgical  Lifestyle  . Physical activity:    Days per week: 0 days    Minutes per session: 0 min  . Stress: Only a little  Relationships  . Social connections:    Talks on phone: More than three times a week    Gets together: More than three times a week    Attends religious service: More than 4 times per year    Active member of club or organization: Yes    Attends meetings of clubs or organizations: More than 4 times per year    Relationship status: Divorced  . Intimate partner violence:    Fear of current or ex partner: Not on file    Emotionally abused: Not on file    Physically abused: Not on file    Forced sexual activity: Not on file  Other Topics Concern  . Not on file  Social History Narrative   Caffeine: rare   Lives alone   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[employer]   Fun/Hobbies: Gardening     Family History  Problem Relation Age of Onset  . Cancer Maternal Grandmother 66       ovarian  . Diabetes Maternal Grandmother   . Hypertension Maternal Grandmother   . Cancer Mother 19       bone, MM  . Stroke Other        unsure who  . CAD Neg Hx   . Colon cancer Neg Hx   . Esophageal cancer Neg Hx   . Rectal cancer Neg Hx   . Stomach cancer Neg Hx     Medications Prior to Admission  Medication Sig Dispense Refill Last Dose  . acetaminophen (TYLENOL) 500 MG tablet Take 500 mg by mouth every 6 (six) hours as needed for moderate pain or headache.   Past Week at Unknown time  . ALPRAZolam (XANAX) 0.25 MG tablet TAKE 1 TABLET(0.25 MG) BY MOUTH AT BEDTIME AS NEEDED FOR SLEEP (Patient taking differently: Take 0.25 mg by mouth at bedtime as needed for sleep. ) 30 tablet 0 Past Month at Unknown time  . aspirin EC 81 MG tablet Take 81 mg by mouth every evening.    Past Month at Unknown time  . atorvastatin  (LIPITOR) 40 MG tablet Take 1 tablet (40 mg total) by mouth daily. (Patient taking differently: Take 40 mg by mouth at bedtime. ) 90 tablet 3 01/22/2019 at Unknown time  . carbamazepine (TEGRETOL) 200 MG tablet Take 1 tablet (200 mg total) by mouth 3 (three) times daily. (Patient taking differently: Take 200 mg by mouth 2 (two) times a day. ) 90 tablet 5 01/22/2019 at Unknown time  . celecoxib (CELEBREX) 200 MG capsule Take 200 mg by mouth  daily as needed for moderate pain.   01/22/2019 at Unknown time  . dorzolamide-timolol (COSOPT) 22.3-6.8 MG/ML ophthalmic solution Place 1 drop into both eyes 2 (two) times daily. 10 mL 1 01/22/2019 at Unknown time  . glipiZIDE (GLUCOTROL XL) 2.5 MG 24 hr tablet Take 1 tablet (2.5 mg total) by mouth daily with breakfast. 30 tablet 5 01/22/2019 at Unknown time  . hydrochlorothiazide (HYDRODIURIL) 25 MG tablet TAKE 1 TABLET(25 MG) BY MOUTH DAILY (Patient taking differently: Take 25 mg by mouth daily. ) 90 tablet 1 01/22/2019 at Unknown time  . lisinopril (PRINIVIL,ZESTRIL) 30 MG tablet Take 1 tablet (30 mg total) by mouth daily. Needs visit for future refills. 30 tablet 2 01/22/2019 at Unknown time  . omeprazole (PRILOSEC) 20 MG capsule Take 20 mg by mouth daily as needed (heartburn).   01/22/2019 at Unknown time  . predniSONE (DELTASONE) 10 MG tablet Take 2 tablets (20 mg total) by mouth daily. 3 tabs by mouth per day for 3 days, then 2 tabs per day for 3 days, then 1 tab per day for 3 days, then stop (Patient taking differently: Take 10-30 mg by mouth See admin instructions. 3 tabs by mouth per day for 3 days, then 2 tabs per day for 3 days, then 1 tab per day for 3 days, then stop ) 28 tablet 1 01/22/2019 at Unknown time  . Accu-Chek FastClix Lancets MISC USE TO TEST BLOOD SUGAR UP TO FOUR TIMES DAILY 306 each 1 Taking  . blood glucose meter kit and supplies KIT Dispense based on patient and insurance preference. Use up to four times daily as directed. DX Code: E11.9 1 each 0  Taking  . glucose blood (ACCU-CHEK GUIDE) test strip USE AS DIRECTED TO TEST BLOOD GLUCOSE UP TO FOUR TIMES DAILY DX: E11.9 100 each 3   . naproxen (NAPROSYN) 375 MG tablet Take 1 tablet (375 mg total) by mouth 2 (two) times daily. (Patient not taking: Reported on 01/21/2019) 14 tablet 0 Not Taking at Unknown time    Current Facility-Administered Medications  Medication Dose Route Frequency Provider Last Rate Last Dose  . ceFAZolin (ANCEF) 2-4 GM/100ML-% IVPB           . ceFAZolin (ANCEF) IVPB 2g/100 mL premix  2 g Intravenous On Call to OR Michael Boston, MD      . Chlorhexidine Gluconate Cloth 2 % PADS 6 each  6 each Topical Once Michael Boston, MD       And  . Chlorhexidine Gluconate Cloth 2 % PADS 6 each  6 each Topical Once Michael Boston, MD      . lactated ringers infusion   Intravenous Continuous Roberts Gaudy, MD 10 mL/hr at 01/23/19 0709     Facility-Administered Medications Ordered in Other Encounters  Medication Dose Route Frequency Provider Last Rate Last Dose  . lactated ringers infusion   Intravenous Continuous PRN Harden Mo, CRNA         Allergies  Allergen Reactions  . Codeine Other (See Comments)    palpitations  . Metformin And Related Nausea Only  . Oxycodone Nausea And Vomiting    BP (!) 168/102   Pulse 83   Temp 98.3 F (36.8 C) (Oral)   Resp 20   Ht 5' 6"  (1.676 m)   Wt 101.5 kg   SpO2 100%   BMI 36.12 kg/m   Labs: Results for orders placed or performed during the hospital encounter of 01/23/19 (from the past 48 hour(s))  Glucose, capillary  Status: Abnormal   Collection Time: 01/23/19  7:27 AM  Result Value Ref Range   Glucose-Capillary 114 (H) 70 - 99 mg/dL   Comment 1 Notify RN     Imaging / Studies: No results found.   [name], M.D., F.A.C.S. Gastrointestinal and Minimally Invasive Surgery [facility] [address], [place], [place] [address] [phone] Main / Paging  01/23/2019 8:18  AM    [name]

## 2019-01-23 NOTE — Anesthesia Preprocedure Evaluation (Signed)
Anesthesia Evaluation  Patient identified by MRN, date of birth, ID band Patient awake    Reviewed: Allergy & Precautions, NPO status , Patient's Chart, lab work & pertinent test results  Airway Mallampati: II  TM Distance: >3 FB Neck ROM: Full    Dental  (+) Teeth Intact, Dental Advisory Given   Pulmonary    breath sounds clear to auscultation       Cardiovascular hypertension,  Rhythm:Regular Rate:Normal     Neuro/Psych    GI/Hepatic   Endo/Other  diabetes  Renal/GU      Musculoskeletal   Abdominal (+) + obese,   Peds  Hematology   Anesthesia Other Findings   Reproductive/Obstetrics                             Anesthesia Physical Anesthesia Plan  ASA: III  Anesthesia Plan: General   Post-op Pain Management:    Induction: Intravenous  PONV Risk Score and Plan: Ondansetron and Dexamethasone  Airway Management Planned: LMA  Additional Equipment:   Intra-op Plan:   Post-operative Plan:   Informed Consent: I have reviewed the patients History and Physical, chart, labs and discussed the procedure including the risks, benefits and alternatives for the proposed anesthesia with the patient or authorized representative who has indicated his/her understanding and acceptance.     Dental advisory given  Plan Discussed with: CRNA and Anesthesiologist  Anesthesia Plan Comments:         Anesthesia Quick Evaluation  

## 2019-01-23 NOTE — Transfer of Care (Signed)
Immediate Anesthesia Transfer of Care Note  Patient: Melissa [name]  Procedure(s) Performed: REMOVAL OF PART OF RIGHT TEMPORAL ARTERY FOR BIOPSY (Right Face)  Patient Location: PACU  Anesthesia Type:General  Level of Consciousness: awake and alert   Airway & Oxygen Therapy: Patient Spontanous Breathing  Post-op Assessment: Report given to RN, Post -op Vital signs reviewed and stable and Patient moving all extremities X 4  Post vital signs: Reviewed and stable  Last Vitals:  Vitals Value Taken Time  BP 159/111 01/23/2019  9:39 AM  Temp    Pulse 77 01/23/2019  9:38 AM  Resp 17 01/23/2019  9:38 AM  SpO2 98 % 01/23/2019  9:38 AM  Vitals shown include unvalidated device data.  Last Pain:  Vitals:   01/23/19 0701  TempSrc:   PainSc: 0-No pain      Patients Stated Pain Goal: 2 (54/56/25 6389)  Complications: No apparent anesthesia complications

## 2019-01-23 NOTE — Op Note (Signed)
01/23/2019  9:28 AM  PATIENT:  Melissa [name]  68 y.o. female  Patient Care Team: Biagio Borg, MD as PCP - General (Internal Medicine) Melida Quitter, MD as Consulting Physician (Otolaryngology) Avie Echevaria., MD as Consulting Physician (Sports Medicine)  PRE-OPERATIVE DIAGNOSIS:  HEADACHES, VISION CHANGES, POSSIBLE TEMPORAL ARTERITIS  POST-OPERATIVE DIAGNOSIS:  HEADACHES, VISION CHANGES, POSSIBLE TEMPORAL ARTERITIS  PROCEDURE:  REMOVAL OF PART OF RIGHT TEMPORAL ARTERY FOR BIOPSY  SURGEON:  [name], MD  ASSISTANT: OR Staff   ANESTHESIA:   local and general  EBL:  Total I/O In: 700 [I.V.:700] Out: 10 [Blood:10]  Delay start of Pharmacological VTE agent (>24hrs) due to surgical blood loss or risk of bleeding:  no  DRAINS: none   SPECIMEN:  TEMPORAL ARTERY   DISPOSITION OF SPECIMEN:  PATHOLOGY  COUNTS:  YES  PLAN OF CARE: Discharge to home after PACU  PATIENT DISPOSITION:  PACU - hemodynamically stable.  INDICATION:   Patient with intermittent severe headaches, particularly right-sided.  Elevated sedimentation rate.  Workup otherwise negative for stroke or other etiology.  Sent to me for excisional biopsy of temporal artery to rule out giant cell/temporal arteritis.  The pathophysiology of temporal arteritis was discussed.  Differential diagnosis is discussed.  Recommend is made for an excision of a segment of the temporal artery front of the ear as an excisional biopsy for diagnostic purposes.  We plan to do it on the same side where the patient is having the symptoms.  Risks of bleeding, infection, nerve injury and other risks were discussed in detail.  Questions were answered.  The patient agrees to proceed  OR FINDINGS: Normal-appearing anatomy.  No inflammation around the artery.  DESCRIPTION:  Informed consent was confirmed.  Patient underwent general anesthesia without difficulty.  The patient received appropriate IV antibiotics.  The patient was  positioned supine,   We trimmed a few hairs along the sideburn region of the right face, ipsilateral to the patient's symptoms, trying to minimize removal but provide adequate exposure.  The area was prepped and draped ina sterile fashion.    Surgical timeout confirmed or plan.  I used a Doppler to help identify the course of the temporal artery running anterior to the anti-tragus of the ear.  I made a gently curved saggital vertical incision about a centimeter anterior to the anti-tragus in the sagittal plane.  I got through the soft tissues.  I identified a torturous arterial structure.  Doppler confirmed arterial signal, consistent with the temporal artery.  I mobilized completely around it and placed a tie around it as a retractor.  I used that to help skeletonize the artery cephalad as well as caudad to get at least 2.5 cm exposed.  Side branches were appropriately ligated.  One I had freed 2 cm of good length, I ligated cephalad and caudad ends.  Specimen was sent processed per pathology protocol (Fresh immediate transport).  Hemostasis was excellent.  I closed the wound in layers using absorbable deep dermal stitches as well as a running subcuticular stitch.  Steri-Strips were applied.  Patient sent to recovery room in stable condition.  Patient did not want me to call anyone.  We will work to have pathology results sent to the patient's physician, etc, for appropriate treatment decisions.  [name], M.D., F.A.C.S. Gastrointestinal and Minimally Invasive Surgery [facility] [address] [place], [place] [address] [phone] Main / Paging

## 2019-01-23 NOTE — Anesthesia Procedure Notes (Signed)
Procedure Name: LMA Insertion Date/Time: 01/23/2019 8:48 AM Performed by: Harden Mo, CRNA Pre-anesthesia Checklist: Patient identified, Emergency Drugs available, Suction available and Patient being monitored Patient Re-evaluated:Patient Re-evaluated prior to induction Oxygen Delivery Method: Circle System Utilized Preoxygenation: Pre-oxygenation with 100% oxygen Induction Type: IV induction LMA: LMA inserted LMA Size: 4.0 Number of attempts: 1 Airway Equipment and Method: Bite block Placement Confirmation: positive ETCO2 Tube secured with: Tape Dental Injury: Teeth and Oropharynx as per pre-operative assessment

## 2019-01-24 ENCOUNTER — Telehealth: Payer: Self-pay

## 2019-01-24 ENCOUNTER — Encounter (HOSPITAL_COMMUNITY): Payer: Self-pay | Admitting: Surgery

## 2019-01-24 NOTE — Telephone Encounter (Signed)
Pt dc'ed 01/23/2019 after admission for right sided face pain. Procedure: removal of part of right temporal artery for biopsy.

## 2019-01-29 DIAGNOSIS — R2681 Unsteadiness on feet: Secondary | ICD-10-CM | POA: Diagnosis not present

## 2019-01-29 DIAGNOSIS — M1612 Unilateral primary osteoarthritis, left hip: Secondary | ICD-10-CM | POA: Diagnosis not present

## 2019-01-29 DIAGNOSIS — M25652 Stiffness of left hip, not elsewhere classified: Secondary | ICD-10-CM | POA: Diagnosis not present

## 2019-01-29 DIAGNOSIS — R29898 Other symptoms and signs involving the musculoskeletal system: Secondary | ICD-10-CM | POA: Diagnosis not present

## 2019-01-29 DIAGNOSIS — Z789 Other specified health status: Secondary | ICD-10-CM | POA: Diagnosis not present

## 2019-01-29 DIAGNOSIS — Z1159 Encounter for screening for other viral diseases: Secondary | ICD-10-CM | POA: Diagnosis not present

## 2019-01-29 DIAGNOSIS — Z01812 Encounter for preprocedural laboratory examination: Secondary | ICD-10-CM | POA: Diagnosis not present

## 2019-02-02 ENCOUNTER — Other Ambulatory Visit: Payer: Self-pay | Admitting: Internal Medicine

## 2019-02-04 DIAGNOSIS — Z7409 Other reduced mobility: Secondary | ICD-10-CM | POA: Diagnosis not present

## 2019-02-05 DIAGNOSIS — Z7982 Long term (current) use of aspirin: Secondary | ICD-10-CM | POA: Diagnosis not present

## 2019-02-05 DIAGNOSIS — R2681 Unsteadiness on feet: Secondary | ICD-10-CM | POA: Diagnosis not present

## 2019-02-05 DIAGNOSIS — M25652 Stiffness of left hip, not elsewhere classified: Secondary | ICD-10-CM | POA: Diagnosis not present

## 2019-02-05 DIAGNOSIS — Z79899 Other long term (current) drug therapy: Secondary | ICD-10-CM | POA: Diagnosis not present

## 2019-02-05 DIAGNOSIS — M1612 Unilateral primary osteoarthritis, left hip: Secondary | ICD-10-CM | POA: Diagnosis not present

## 2019-02-05 DIAGNOSIS — R29898 Other symptoms and signs involving the musculoskeletal system: Secondary | ICD-10-CM | POA: Diagnosis not present

## 2019-02-05 DIAGNOSIS — E119 Type 2 diabetes mellitus without complications: Secondary | ICD-10-CM | POA: Diagnosis not present

## 2019-02-05 DIAGNOSIS — Z789 Other specified health status: Secondary | ICD-10-CM | POA: Diagnosis not present

## 2019-02-06 DIAGNOSIS — Z79899 Other long term (current) drug therapy: Secondary | ICD-10-CM | POA: Diagnosis not present

## 2019-02-06 DIAGNOSIS — Z789 Other specified health status: Secondary | ICD-10-CM | POA: Diagnosis not present

## 2019-02-06 DIAGNOSIS — R2681 Unsteadiness on feet: Secondary | ICD-10-CM | POA: Diagnosis not present

## 2019-02-06 DIAGNOSIS — M1612 Unilateral primary osteoarthritis, left hip: Secondary | ICD-10-CM | POA: Diagnosis not present

## 2019-02-06 DIAGNOSIS — Z7982 Long term (current) use of aspirin: Secondary | ICD-10-CM | POA: Diagnosis not present

## 2019-02-06 DIAGNOSIS — M25652 Stiffness of left hip, not elsewhere classified: Secondary | ICD-10-CM | POA: Diagnosis not present

## 2019-02-06 DIAGNOSIS — E119 Type 2 diabetes mellitus without complications: Secondary | ICD-10-CM | POA: Diagnosis not present

## 2019-02-06 DIAGNOSIS — R29898 Other symptoms and signs involving the musculoskeletal system: Secondary | ICD-10-CM | POA: Diagnosis not present

## 2019-02-07 DIAGNOSIS — M1612 Unilateral primary osteoarthritis, left hip: Secondary | ICD-10-CM | POA: Diagnosis not present

## 2019-02-07 DIAGNOSIS — Z79899 Other long term (current) drug therapy: Secondary | ICD-10-CM | POA: Diagnosis not present

## 2019-02-07 DIAGNOSIS — Z7982 Long term (current) use of aspirin: Secondary | ICD-10-CM | POA: Diagnosis not present

## 2019-02-07 DIAGNOSIS — E119 Type 2 diabetes mellitus without complications: Secondary | ICD-10-CM | POA: Diagnosis not present

## 2019-02-07 DIAGNOSIS — Z789 Other specified health status: Secondary | ICD-10-CM | POA: Diagnosis not present

## 2019-02-07 DIAGNOSIS — M25652 Stiffness of left hip, not elsewhere classified: Secondary | ICD-10-CM | POA: Diagnosis not present

## 2019-02-07 DIAGNOSIS — R29898 Other symptoms and signs involving the musculoskeletal system: Secondary | ICD-10-CM | POA: Diagnosis not present

## 2019-02-07 DIAGNOSIS — R2681 Unsteadiness on feet: Secondary | ICD-10-CM | POA: Diagnosis not present

## 2019-02-11 DIAGNOSIS — M5416 Radiculopathy, lumbar region: Secondary | ICD-10-CM | POA: Diagnosis not present

## 2019-03-18 ENCOUNTER — Other Ambulatory Visit: Payer: Self-pay | Admitting: Family

## 2019-04-15 ENCOUNTER — Other Ambulatory Visit: Payer: Self-pay | Admitting: Internal Medicine

## 2019-04-15 DIAGNOSIS — Z1231 Encounter for screening mammogram for malignant neoplasm of breast: Secondary | ICD-10-CM

## 2019-04-22 DIAGNOSIS — H401133 Primary open-angle glaucoma, bilateral, severe stage: Secondary | ICD-10-CM | POA: Diagnosis not present

## 2019-04-22 DIAGNOSIS — H5213 Myopia, bilateral: Secondary | ICD-10-CM | POA: Diagnosis not present

## 2019-04-22 DIAGNOSIS — H53413 Scotoma involving central area, bilateral: Secondary | ICD-10-CM | POA: Diagnosis not present

## 2019-04-22 DIAGNOSIS — E119 Type 2 diabetes mellitus without complications: Secondary | ICD-10-CM | POA: Diagnosis not present

## 2019-04-22 DIAGNOSIS — H524 Presbyopia: Secondary | ICD-10-CM | POA: Diagnosis not present

## 2019-04-22 LAB — HM DIABETES EYE EXAM

## 2019-05-01 ENCOUNTER — Other Ambulatory Visit: Payer: Self-pay | Admitting: Internal Medicine

## 2019-05-02 ENCOUNTER — Other Ambulatory Visit: Payer: Self-pay

## 2019-05-02 MED ORDER — HYDROCHLOROTHIAZIDE 25 MG PO TABS: ORAL_TABLET | ORAL | 1 refills | Status: DC

## 2019-05-03 ENCOUNTER — Other Ambulatory Visit: Payer: Self-pay | Admitting: Internal Medicine

## 2019-05-05 ENCOUNTER — Other Ambulatory Visit: Payer: Self-pay | Admitting: Internal Medicine

## 2019-05-19 DIAGNOSIS — R29898 Other symptoms and signs involving the musculoskeletal system: Secondary | ICD-10-CM | POA: Diagnosis not present

## 2019-05-19 DIAGNOSIS — Z471 Aftercare following joint replacement surgery: Secondary | ICD-10-CM | POA: Diagnosis not present

## 2019-05-19 DIAGNOSIS — M25652 Stiffness of left hip, not elsewhere classified: Secondary | ICD-10-CM | POA: Diagnosis not present

## 2019-05-19 DIAGNOSIS — M25552 Pain in left hip: Secondary | ICD-10-CM | POA: Diagnosis not present

## 2019-05-19 DIAGNOSIS — M1612 Unilateral primary osteoarthritis, left hip: Secondary | ICD-10-CM | POA: Diagnosis not present

## 2019-05-28 DIAGNOSIS — H401133 Primary open-angle glaucoma, bilateral, severe stage: Secondary | ICD-10-CM | POA: Diagnosis not present

## 2019-05-28 DIAGNOSIS — H53432 Sector or arcuate defects, left eye: Secondary | ICD-10-CM | POA: Diagnosis not present

## 2019-06-02 ENCOUNTER — Ambulatory Visit: Payer: Medicare Other

## 2019-06-02 ENCOUNTER — Other Ambulatory Visit: Payer: Self-pay

## 2019-06-02 ENCOUNTER — Ambulatory Visit
Admission: RE | Admit: 2019-06-02 | Discharge: 2019-06-02 | Disposition: A | Discharge status: Home / Self Care | Payer: Medicare Other | Source: Ambulatory Visit | Attending: Internal Medicine | Admitting: Internal Medicine

## 2019-06-02 DIAGNOSIS — Z1231 Encounter for screening mammogram for malignant neoplasm of breast: Secondary | ICD-10-CM

## 2019-06-03 DIAGNOSIS — R29898 Other symptoms and signs involving the musculoskeletal system: Secondary | ICD-10-CM | POA: Diagnosis not present

## 2019-06-03 DIAGNOSIS — M1612 Unilateral primary osteoarthritis, left hip: Secondary | ICD-10-CM | POA: Diagnosis not present

## 2019-06-03 DIAGNOSIS — Z471 Aftercare following joint replacement surgery: Secondary | ICD-10-CM | POA: Diagnosis not present

## 2019-06-03 DIAGNOSIS — M25512 Pain in left shoulder: Secondary | ICD-10-CM | POA: Diagnosis not present

## 2019-06-03 DIAGNOSIS — M25552 Pain in left hip: Secondary | ICD-10-CM | POA: Diagnosis not present

## 2019-06-03 DIAGNOSIS — M7541 Impingement syndrome of right shoulder: Secondary | ICD-10-CM | POA: Diagnosis not present

## 2019-06-03 DIAGNOSIS — M25652 Stiffness of left hip, not elsewhere classified: Secondary | ICD-10-CM | POA: Diagnosis not present

## 2019-06-05 ENCOUNTER — Encounter: Payer: Self-pay | Admitting: Internal Medicine

## 2019-06-05 ENCOUNTER — Ambulatory Visit (INDEPENDENT_AMBULATORY_CARE_PROVIDER_SITE_OTHER): Payer: Medicare Other | Admitting: Internal Medicine

## 2019-06-05 DIAGNOSIS — F419 Anxiety disorder, unspecified: Secondary | ICD-10-CM

## 2019-06-05 DIAGNOSIS — E119 Type 2 diabetes mellitus without complications: Secondary | ICD-10-CM | POA: Diagnosis not present

## 2019-06-05 DIAGNOSIS — F329 Major depressive disorder, single episode, unspecified: Secondary | ICD-10-CM | POA: Diagnosis not present

## 2019-06-05 DIAGNOSIS — F32A Depression, unspecified: Secondary | ICD-10-CM

## 2019-06-05 MED ORDER — CITALOPRAM HYDROBROMIDE 10 MG PO TABS: 10.0000 mg | ORAL_TABLET | Freq: Every day | ORAL | 3 refills | Status: DC

## 2019-06-05 NOTE — Assessment & Plan Note (Signed)
See notes

## 2019-06-05 NOTE — Progress Notes (Signed)
Patient ID: Melissa [name], female   DOB: 1951-01-19, 68 y.o.   MRN: [id number]  Phone note:  Cumulative time during 7-day interval 14 min, there was not an associated office visit for this concern within a 7 day period.  Verbal consent for services obtained from patient prior to services given.  Names of all persons present for services: Cathlean Cower, MD, patient  Chief complaint: depression  History, background, results pertinent:  Here with 2 wks worsening daily depressive symptoms, feeling down, not looking forward, no enjoyment of usual activities but without Si or HI, and has ongoing mild anxiety as well, crying spells, and multiple social stressors asking for med tx and counseling referral, as this has helped in the past.  Pt denies chest pain, increased sob or doe, wheezing, orthopnea, PND, increased LE swelling, palpitations, dizziness or syncope.  Pt denies new neurological symptoms such as new headache, or facial or extremity weakness or numbness   Pt denies polydipsia, polyuria Past Medical History:  Diagnosis Date  . Allergy   . Anxiety   . Arthritis    s/p R TKR  . Cyst of left kidney 2015   by lumbar MRI pending renal US  . DDD (degenerative disc disease), lumbar 03/2014   severe L2-3 with L HNP with L2/3 nerve root impingement [name])  . Depression   . Depression with anxiety   . Diabetes type 2, controlled ([place]) 2011   borderline  . Dyspnea   . Glaucoma   . History of ulcer disease   . HLD (hyperlipidemia)    no meds taken  . Hypertension   . PONV (postoperative nausea and vomiting)   . Ulcer    No results found for this or any previous visit (from the past 48 hour(s)). Current Outpatient Medications on File Prior to Visit  Medication Sig Dispense Refill  . Accu-Chek FastClix Lancets MISC USE TO TEST BLOOD SUGAR UP TO FOUR TIMES DAILY 306 each 1  . acetaminophen (TYLENOL) 500 MG tablet Take 500 mg by mouth every 6 (six) hours as needed for moderate pain or  headache.    . ALPRAZolam (XANAX) 0.25 MG tablet TAKE 1 TABLET(0.25 MG) BY MOUTH AT BEDTIME AS NEEDED FOR SLEEP (Patient taking differently: Take 0.25 mg by mouth at bedtime as needed for sleep. ) 30 tablet 0  . aspirin EC 81 MG tablet Take 81 mg by mouth every evening.     [name] atorvastatin (LIPITOR) 40 MG tablet Take 1 tablet (40 mg total) by mouth daily. (Patient taking differently: Take 40 mg by mouth at bedtime. ) 90 tablet 3  . blood glucose meter kit and supplies KIT Dispense based on patient and insurance preference. Use up to four times daily as directed. DX Code: E11.9 1 each 0  . carbamazepine (TEGRETOL) 200 MG tablet Take 1 tablet (200 mg total) by mouth 3 (three) times daily. (Patient taking differently: Take 200 mg by mouth 2 (two) times a day. ) 90 tablet 5  . celecoxib (CELEBREX) 200 MG capsule Take 200 mg by mouth daily as needed for moderate pain.    . dorzolamide-timolol (COSOPT) 22.3-6.8 MG/ML ophthalmic solution Place 1 drop into both eyes 2 (two) times daily. 10 mL 1  . glipiZIDE (GLUCOTROL XL) 2.5 MG 24 hr tablet TAKE 1 TABLET BY MOUTH DAILY WITH BREAKFAST 30 tablet 5  . glucose blood (ACCU-CHEK GUIDE) test strip USE AS DIRECTED TO TEST BLOOD GLUCOSE UP TO FOUR TIMES DAILY DX: E11.9 100  each 3  . hydrochlorothiazide (HYDRODIURIL) 25 MG tablet TAKE 1 TABLET(25 MG) BY MOUTH DAILY 90 tablet 1  . lisinopril (ZESTRIL) 30 MG tablet TAKE 1 TABLET(30 MG) BY MOUTH DAILY 30 tablet 2  . lisinopril-hydrochlorothiazide (ZESTORETIC) 20-12.5 MG tablet TAKE 1 TABLET BY MOUTH EVERY MORNING 90 tablet 0  . omeprazole (PRILOSEC) 20 MG capsule Take 20 mg by mouth daily as needed (heartburn).    . predniSONE (DELTASONE) 10 MG tablet Take 2 tablets (20 mg total) by mouth daily. 3 tabs by mouth per day for 3 days, then 2 tabs per day for 3 days, then 1 tab per day for 3 days, then stop (Patient taking differently: Take 10-30 mg by mouth See admin instructions. 3 tabs by mouth per day for 3 days, then 2  tabs per day for 3 days, then 1 tab per day for 3 days, then stop ) 28 tablet 1   No current facility-administered medications on file prior to visit.    Lab Results  Component Value Date   WBC 5.6 01/09/2019   HGB 12.1 01/09/2019   HCT 37.0 01/09/2019   PLT 311.0 01/09/2019   GLUCOSE 109 (H) 01/09/2019   CHOL 242 (H) 01/09/2019   TRIG 151.0 (H) 01/09/2019   HDL 61.80 01/09/2019   LDLDIRECT 183.0 09/18/2017   LDLCALC 150 (H) 01/09/2019   ALT 19 01/09/2019   AST 18 01/09/2019   NA 140 01/09/2019   K 3.8 01/09/2019   CL 103 01/09/2019   CREATININE 0.74 01/09/2019   BUN 15 01/09/2019   CO2 28 01/09/2019   TSH 1.34 01/09/2019   INR 1.01 01/06/2012   HGBA1C 6.6 (H) 01/09/2019   MICROALBUR 1.3 01/09/2019   A/P/next steps:   1)  Depression with anxiety - denies SI or HI, for counseling referral, and celexa 10 qd,  to f/u any worsening symptoms or concerns  Cathlean Cower MD

## 2019-06-05 NOTE — Patient Instructions (Signed)
Please take all new medication as prescribed - the celexa 10 mg per day  You will be contacted regarding the referral for: counseling  Please continue all other medications as before, and refills have been done if requested.  Please have the pharmacy call with any other refills you may need.  Please continue your efforts at being more active, low cholesterol diet, and weight control.  Please keep your appointments with your specialists as you may have planned

## 2019-06-10 DIAGNOSIS — Z471 Aftercare following joint replacement surgery: Secondary | ICD-10-CM | POA: Diagnosis not present

## 2019-06-10 DIAGNOSIS — M25652 Stiffness of left hip, not elsewhere classified: Secondary | ICD-10-CM | POA: Diagnosis not present

## 2019-06-10 DIAGNOSIS — R29898 Other symptoms and signs involving the musculoskeletal system: Secondary | ICD-10-CM | POA: Diagnosis not present

## 2019-06-10 DIAGNOSIS — M1612 Unilateral primary osteoarthritis, left hip: Secondary | ICD-10-CM | POA: Diagnosis not present

## 2019-06-10 DIAGNOSIS — M25552 Pain in left hip: Secondary | ICD-10-CM | POA: Diagnosis not present

## 2019-06-17 DIAGNOSIS — Z471 Aftercare following joint replacement surgery: Secondary | ICD-10-CM | POA: Diagnosis not present

## 2019-06-17 DIAGNOSIS — M25652 Stiffness of left hip, not elsewhere classified: Secondary | ICD-10-CM | POA: Diagnosis not present

## 2019-06-17 DIAGNOSIS — R29898 Other symptoms and signs involving the musculoskeletal system: Secondary | ICD-10-CM | POA: Diagnosis not present

## 2019-06-17 DIAGNOSIS — M1612 Unilateral primary osteoarthritis, left hip: Secondary | ICD-10-CM | POA: Diagnosis not present

## 2019-06-17 DIAGNOSIS — M25552 Pain in left hip: Secondary | ICD-10-CM | POA: Diagnosis not present

## 2019-06-23 ENCOUNTER — Telehealth: Payer: Self-pay | Admitting: Internal Medicine

## 2019-06-23 ENCOUNTER — Other Ambulatory Visit: Payer: Self-pay | Admitting: Internal Medicine

## 2019-06-23 MED ORDER — CITALOPRAM HYDROBROMIDE 10 MG PO TABS: 10.0000 mg | ORAL_TABLET | Freq: Every day | ORAL | 3 refills | Status: DC

## 2019-06-23 NOTE — Telephone Encounter (Signed)
This was already filled oct 1, but I did it again

## 2019-06-23 NOTE — Telephone Encounter (Signed)
John pt 

## 2019-06-23 NOTE — Telephone Encounter (Signed)
Patient is calling regarding citalopram (CELEXA) 10 MG tablet [id number]  It is making her blood Pressure go up.  Patient is calling to see if Dr. [name] can increase her Linsopril to 20mg  in the morning and 20 in the evening.  The patient does not want a 40 mg pill. The patient does have a blood pressure cuff to take her blood pressure.  CB- [phone]

## 2019-06-24 ENCOUNTER — Encounter: Payer: Self-pay | Admitting: Internal Medicine

## 2019-06-24 DIAGNOSIS — M1612 Unilateral primary osteoarthritis, left hip: Secondary | ICD-10-CM | POA: Diagnosis not present

## 2019-06-24 DIAGNOSIS — R29898 Other symptoms and signs involving the musculoskeletal system: Secondary | ICD-10-CM | POA: Diagnosis not present

## 2019-06-24 DIAGNOSIS — Z96642 Presence of left artificial hip joint: Secondary | ICD-10-CM | POA: Diagnosis not present

## 2019-06-24 DIAGNOSIS — M25552 Pain in left hip: Secondary | ICD-10-CM | POA: Diagnosis not present

## 2019-06-24 DIAGNOSIS — Z471 Aftercare following joint replacement surgery: Secondary | ICD-10-CM | POA: Diagnosis not present

## 2019-06-24 MED ORDER — LISINOPRIL 40 MG PO TABS: 40.0000 mg | ORAL_TABLET | Freq: Every day | ORAL | 3 refills | Status: DC

## 2019-06-24 NOTE — Telephone Encounter (Signed)
Patient called to see if the doctor can adjust her medication to 40 mg.  Patient stated that he called in the 30 mg.  The insurance will only pay for the 40 mg.  CB# [phone]

## 2019-06-24 NOTE — Telephone Encounter (Signed)
Pt now states the insurance will not pay for this med with the 2 20mg  and DOES need the 40 mg. Ins will only pay for it this way. Drugstore is voiding yesterday's script. Pt promises to cut in the in half so the ins will pay out on this Please re do script/

## 2019-06-24 NOTE — Telephone Encounter (Signed)
Done erx 

## 2019-06-25 NOTE — Telephone Encounter (Signed)
Pt informed of below.  

## 2019-06-26 DIAGNOSIS — M25652 Stiffness of left hip, not elsewhere classified: Secondary | ICD-10-CM | POA: Diagnosis not present

## 2019-06-26 DIAGNOSIS — Z96642 Presence of left artificial hip joint: Secondary | ICD-10-CM | POA: Diagnosis not present

## 2019-06-26 DIAGNOSIS — Z471 Aftercare following joint replacement surgery: Secondary | ICD-10-CM | POA: Diagnosis not present

## 2019-06-26 DIAGNOSIS — R29898 Other symptoms and signs involving the musculoskeletal system: Secondary | ICD-10-CM | POA: Diagnosis not present

## 2019-06-26 DIAGNOSIS — M25552 Pain in left hip: Secondary | ICD-10-CM | POA: Diagnosis not present

## 2019-07-08 DIAGNOSIS — M25552 Pain in left hip: Secondary | ICD-10-CM | POA: Diagnosis not present

## 2019-07-08 DIAGNOSIS — Z471 Aftercare following joint replacement surgery: Secondary | ICD-10-CM | POA: Diagnosis not present

## 2019-07-08 DIAGNOSIS — M1612 Unilateral primary osteoarthritis, left hip: Secondary | ICD-10-CM | POA: Diagnosis not present

## 2019-07-08 DIAGNOSIS — R29898 Other symptoms and signs involving the musculoskeletal system: Secondary | ICD-10-CM | POA: Diagnosis not present

## 2019-07-08 DIAGNOSIS — M25652 Stiffness of left hip, not elsewhere classified: Secondary | ICD-10-CM | POA: Diagnosis not present

## 2019-07-11 NOTE — Progress Notes (Addendum)
Subjective:   Melissa [name] is a 68 y.o. female who presents for Medicare Annual (Subsequent) preventive examination.  Review of Systems:  Cardiac Risk Factors include: advanced age (>64mn, >>59women);diabetes mellitus Sleep patterns: gets up 1-2 times nightly to void and sleeps 6-8 hours nightly. Patient reports insomnia issues, discussed recommended sleep tips and stress reduction tips.   Home Safety/Smoke Alarms: Feels safe in home. Smoke alarms in place.  Living environment; residence and Firearm Safety: 1-story house/ trailer. Lives alone, no needs for DME, good support system  Seat Belt Safety/Bike Helmet: Wears seat belt.      Objective:     Vitals: BP 126/82   Pulse 65   Ht 5' 6"  (1.676 m)   Wt 216 lb (98 kg)   SpO2 98%   BMI 34.86 kg/m   Body mass index is 34.86 kg/m.  Advanced Directives 07/14/2019 01/23/2019 01/22/2019 08/17/2018 05/15/2018 04/16/2018 04/10/2017  Does Patient Have a Medical Advance Directive? No No No No No No No  Would patient like information on creating a medical advance directive? No - Patient declined No - Patient declined No - Patient declined - Yes (ED - Information included in AVS) Yes (ED - Information included in AVS) -  Pre-existing out of facility DNR order (yellow form or pink MOST form) - - - - - - -    Tobacco Social History   Tobacco Use  Smoking Status Never Smoker  Smokeless Tobacco Never Used     Counseling given: Not Answered  Past Medical History:  Diagnosis Date  . Allergy   . Anxiety   . Arthritis    s/p R TKR  . Cyst of left kidney 2015   by lumbar MRI pending renal [place] . DDD (degenerative disc disease), lumbar 03/2014   severe L2-3 with L HNP with L2/3 nerve root impingement ([name]@ [facility])  . Depression   . Depression with anxiety   . Diabetes type 2, [place]   borderline  . Dyspnea   . Glaucoma   . History of ulcer disease   . HLD (hyperlipidemia)    no meds taken  . Hypertension   . PONV  (postoperative nausea and vomiting)   . Ulcer    Past Surgical History:  Procedure Laterality Date  . ABDOMINAL HYSTERECTOMY  2007   fibroids, heavy bleeding  . ARTERY BIOPSY Right 01/23/2019   Procedure: REMOVAL OF PART OF RIGHT TEMPORAL ARTERY FOR BIOPSY;  Surgeon: GMichael Boston MD;  Location: [facility]  Service: General;  Laterality: Right;  . CATARACT EXTRACTION W/ INTRAOCULAR LENS IMPLANT Bilateral   . COLONOSCOPY  04/2017   TA, diverticulosis, rpt ? ([facility])  . GLAUCOMA SURGERY    . HAMMER TOE SURGERY Bilateral   . Right hip replacement    . TONSILLECTOMY    . TOTAL KNEE ARTHROPLASTY Right 2004   TKR ([facility])  . TOTAL KNEE REVISION Right 03/2014   [facility]   Family History  Problem Relation Age of Onset  . Cancer Maternal Grandmother 68      ovarian  . Diabetes Maternal Grandmother   . Hypertension Maternal Grandmother   . Cancer Mother 48      bone, MM  . Stroke Other        unsure who  . CAD Neg Hx   . Colon cancer Neg Hx   . Esophageal cancer Neg Hx   . Rectal cancer Neg Hx   .  Stomach cancer Neg Hx    Social History   Socioeconomic History  . Marital status: Divorced    Spouse name: Not on file  . Number of children: 0  . Years of education: 65  . Highest education level: Not on file  Occupational History  . Occupation: work with disabilities/ Retired    Fish farm manager: [employer]  . Financial resource strain: Somewhat hard  . Food insecurity    Worry: Sometimes true    Inability: Sometimes true  . Transportation needs    Medical: No    Non-medical: No  Tobacco Use  . Smoking status: Never Smoker  . Smokeless tobacco: Never Used  Substance and Sexual Activity  . Alcohol use: No  . Drug use: No  . Sexual activity: Not Currently    Birth control/protection: Surgical  Lifestyle  . Physical activity    Days per week: 0 days    Minutes per session: 0 min  . Stress: Only a little  Relationships  . Social  connections    Talks on phone: More than three times a week    Gets together: More than three times a week    Attends religious service: More than 4 times per year    Active member of club or organization: Yes    Attends meetings of clubs or organizations: More than 4 times per year    Relationship status: Divorced  Other Topics Concern  . Not on file  Social History Narrative   Caffeine: rare   Lives alone   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[employer]   Fun/Hobbies: Gardening     Outpatient Encounter Medications as of 07/14/2019  Medication Sig  . Accu-Chek FastClix Lancets MISC USE TO TEST BLOOD SUGAR UP TO FOUR TIMES DAILY  . acetaminophen (TYLENOL) 500 MG tablet Take 500 mg by mouth every 6 (six) hours as needed for moderate pain or headache.  . ALPRAZolam (XANAX) 0.25 MG tablet TAKE 1 TABLET(0.25 MG) BY MOUTH AT BEDTIME AS NEEDED FOR SLEEP (Patient taking differently: Take 0.25 mg by mouth at bedtime as needed for sleep. )  . aspirin EC 81 MG tablet Take 81 mg by mouth every evening.   [name] atorvastatin (LIPITOR) 40 MG tablet Take 1 tablet (40 mg total) by mouth daily. (Patient taking differently: Take 40 mg by mouth at bedtime. )  . blood glucose meter kit and supplies KIT Dispense based on patient and insurance preference. Use up to four times daily as directed. DX Code: E11.9  . citalopram (CELEXA) 10 MG tablet Take 1 tablet (10 mg total) by mouth daily. (Patient taking differently: Take 5 mg by mouth daily. )  . dorzolamide-timolol (COSOPT) 22.3-6.8 MG/ML ophthalmic solution Place 1 drop into both eyes 2 (two) times daily.  [name] glipiZIDE (GLUCOTROL XL) 2.5 MG 24 hr tablet TAKE 1 TABLET BY MOUTH DAILY WITH BREAKFAST  . glucose blood (ACCU-CHEK GUIDE) test strip USE AS DIRECTED TO TEST BLOOD GLUCOSE UP TO FOUR TIMES DAILY DX: E11.9  . hydrochlorothiazide (HYDRODIURIL) 25 MG tablet TAKE 1 TABLET(25 MG) BY MOUTH DAILY  . lisinopril (ZESTRIL) 40 MG tablet Take 1 tablet (40 mg  total) by mouth daily.  . MULTIPLE VITAMIN PO Take 1 tablet by mouth daily.  [name] omeprazole (PRILOSEC) 20 MG capsule Take 20 mg by mouth daily as needed (heartburn).  . [DISCONTINUED] carbamazepine (TEGRETOL) 200 MG tablet Take 1 tablet (200 mg total) by mouth 3 (three) times daily. (Patient not taking:  Reported on 07/14/2019)  . [DISCONTINUED] celecoxib (CELEBREX) 200 MG capsule Take 200 mg by mouth daily as needed for moderate pain.  . [DISCONTINUED] lisinopril-hydrochlorothiazide (ZESTORETIC) 20-12.5 MG tablet TAKE 1 TABLET BY MOUTH EVERY MORNING (Patient not taking: Reported on 07/14/2019)  . [DISCONTINUED] predniSONE (DELTASONE) 10 MG tablet Take 2 tablets (20 mg total) by mouth daily. 3 tabs by mouth per day for 3 days, then 2 tabs per day for 3 days, then 1 tab per day for 3 days, then stop (Patient not taking: Reported on 07/14/2019)   No facility-administered encounter medications on file as of 07/14/2019.     Activities of Daily Living In your present state of health, do you have any difficulty performing the following activities: 07/14/2019 01/23/2019  Hearing? N -  Vision? N -  Difficulty concentrating or making decisions? N -  Walking or climbing stairs? N -  Dressing or bathing? N -  Doing errands, shopping? N N  Preparing Food and eating ? N -  Using the Toilet? N -  In the past six months, have you accidently leaked urine? N -  Do you have problems with loss of bowel control? N -  Managing your Medications? N -  Managing your Finances? N -  Housekeeping or managing your Housekeeping? N -  Some recent data might be hidden    Patient Care Team: Biagio Borg, MD as PCP - General (Internal Medicine) Melida Quitter, MD as Consulting Physician (Otolaryngology) Avie Echevaria., MD as Consulting Physician (Sports Medicine)    Assessment:   This is a routine wellness examination for Jil. Physical assessment deferred to PCP.  Exercise Activities and Dietary recommendations  Current Exercise Habits: Home exercise routine, Type of exercise: walking, Time (Minutes): 25, Frequency (Times/Week): 2, Weekly Exercise (Minutes/Week): 50, Intensity: Mild  Diet (meal preparation, eat out, water intake, caffeinated beverages, dairy products, fruits and vegetables): in general, a "healthy" diet     Reviewed heart healthy and diabetic diet. Encouraged patient to increase daily water and healthy fluid intake. Discussed weight loss strategies and diet plan. Relevant patient education assigned to patient using Emmi.  Goals   None     Fall Risk Fall Risk  07/14/2019 01/07/2019 05/15/2018 05/30/2017  Falls in the past year? 0 0 No No  Number falls in past yr: 0 - - -  Injury with Fall? 0 - - -  Risk for fall due to : Impaired mobility - - -  Follow up Falls prevention discussed - - -   Is the patient's home free of loose throw rugs in walkways, pet beds, electrical cords, etc?   yes      Grab bars in the bathroom? no      Handrails on the stairs?   yes      Adequate lighting?   yes  Depression Screen PHQ 2/9 Scores 07/14/2019 01/07/2019 05/15/2018 05/30/2017  PHQ - 2 Score 2 1 1  0  PHQ- 9 Score 5 - 3 -     Cognitive Function       Ad8 score reviewed for issues:  Issues making decisions: no  Less interest in hobbies / activities: no  Repeats questions, stories (family complaining): no  Trouble using ordinary gadgets (microwave, computer, phone):no  Forgets the month or year: no  Mismanaging finances: no  Remembering appts: no  Daily problems with thinking and/or memory: no Ad8 score is= 0    Immunization History  Administered Date(s) Administered  . Influenza, High Dose Seasonal  PF 05/30/2017, 05/15/2018, 06/13/2019  . Influenza,inj,Quad PF,6+ Mos 05/13/2013  . Influenza,inj,quad, With Preservative 05/29/2014  . Influenza-Unspecified 06/04/2014, 06/26/2015, 05/15/2016  . Pneumococcal Conjugate-13 09/18/2017  . Pneumococcal Polysaccharide-23 08/05/2014   . Tdap 04/16/2011    Screening Tests Health Maintenance  Topic Date Due  . Hepatitis C Screening  [birth year]  . DEXA SCAN  09/25/2015  . HEMOGLOBIN A1C  07/12/2019  . PNA vac Low Risk Adult (2 of 2 - PPSV23) 08/06/2019  . FOOT EXAM  01/07/2020  . MAMMOGRAM  06/01/2020  . OPHTHALMOLOGY EXAM  06/08/2020  . TETANUS/TDAP  04/15/2021  . COLONOSCOPY  04/24/2022  . INFLUENZA VACCINE  Completed      Plan:     Reviewed health maintenance screenings with patient today and relevant education, vaccines, and/or referrals were provided.   I have personally reviewed and noted the following in the patient's chart:   . Medical and social history . Use of alcohol, tobacco or illicit drugs  . Current medications and supplements . Functional ability and status . Nutritional status . Physical activity . Advanced directives . List of other physicians . Vitals . Screenings to include cognitive, depression, and falls . Referrals and appointments  In addition, I have reviewed and discussed with patient certain preventive protocols, quality metrics, and best practice recommendations. A written personalized care plan for preventive services as well as general preventive health recommendations were provided to patient.     Michiel Cowboy, RN  07/14/2019    Medical screening examination/treatment/procedure(s) were performed by non-physician practitioner and as supervising physician I was immediately available for consultation/collaboration. I agree with above. Scarlette Calico, MD

## 2019-07-14 ENCOUNTER — Other Ambulatory Visit: Payer: Self-pay

## 2019-07-14 ENCOUNTER — Ambulatory Visit (INDEPENDENT_AMBULATORY_CARE_PROVIDER_SITE_OTHER): Payer: Medicare Other | Admitting: *Deleted

## 2019-07-14 VITALS — BP 126/82 | HR 65 | Ht 66.0 in | Wt 216.0 lb

## 2019-07-14 DIAGNOSIS — Z780 Asymptomatic menopausal state: Secondary | ICD-10-CM

## 2019-07-14 DIAGNOSIS — Z0000 Encounter for general adult medical examination without abnormal findings: Secondary | ICD-10-CM

## 2019-07-14 NOTE — Patient Instructions (Addendum)
If you cannot attend class in person, you can still exercise at home. Video taped versions of AHOY classes are shown on [employer] (GTN) at 8 am and 1 pm Mondays through Fridays. You can also purchase a copy of the AHOY DVD by calling [place] (GTN) [employer]. GTN is available on [employer] channel 13 with a digital cable box and on [employer] channel 31. GTN is also available on [employer], channel 99. To view GTN, go to channel 99, press OK, select [place], then select GTN to start the channel.  If you or someone you know has experienced the death of a loved one, the support of others can play an invaluable role in the healing process. [facility] offers grief and loss services for anyone in the community. Contact us today [phone]  [employer] discussed. Please contact your pharmacy for coverage information.    Melissa [name] , Thank you for taking time to come for your Medicare Wellness Visit. I appreciate your ongoing commitment to your health goals. Please review the following plan we discussed and let me know if I can assist you in the future.   These are the goals we discussed: Goals   None     This is a list of the screening recommended for you and due dates:  Health Maintenance  Topic Date Due  .  Hepatitis C: One time screening is recommended by Center for Disease Control  (CDC) for  adults born from [birth year] through 1965.   [birth year]  . DEXA scan (bone density measurement)  09/25/2015  . Hemoglobin A1C  07/12/2019  . Pneumonia vaccines (2 of 2 - PPSV23) 08/06/2019  . Complete foot exam   01/07/2020  . Mammogram  06/01/2020  . Eye exam for diabetics  06/08/2020  . Tetanus Vaccine  04/15/2021  . Colon Cancer Screening  04/24/2022  . Flu Shot  Completed

## 2019-08-11 ENCOUNTER — Inpatient Hospital Stay: Admission: RE | Admit: 2019-08-11 | Payer: Medicare Other | Source: Ambulatory Visit

## 2019-08-15 DIAGNOSIS — H401133 Primary open-angle glaucoma, bilateral, severe stage: Secondary | ICD-10-CM | POA: Diagnosis not present

## 2019-08-15 DIAGNOSIS — H53411 Scotoma involving central area, right eye: Secondary | ICD-10-CM | POA: Diagnosis not present

## 2019-08-15 DIAGNOSIS — H53432 Sector or arcuate defects, left eye: Secondary | ICD-10-CM | POA: Diagnosis not present

## 2019-08-17 ENCOUNTER — Other Ambulatory Visit: Payer: Self-pay

## 2019-08-17 ENCOUNTER — Encounter: Payer: Self-pay | Admitting: Emergency Medicine

## 2019-08-17 ENCOUNTER — Ambulatory Visit
Admission: EM | Admit: 2019-08-17 | Discharge: 2019-08-17 | Disposition: A | Discharge status: Home / Self Care | Payer: Medicare Other

## 2019-08-17 DIAGNOSIS — I10 Essential (primary) hypertension: Secondary | ICD-10-CM | POA: Diagnosis not present

## 2019-08-17 NOTE — ED Provider Notes (Signed)
[name]    CSN: [id number] Arrival date & time: 08/17/19  0854      History   Chief Complaint No chief complaint on file.   HPI Melissa [name] is a 68 y.o. female.   68 year old female with history of HTN, noninsulin dependant DM, HLD comes in for hypertension. States yesterday after eating [employer], she layed down causing some chest tightness.  States she took some antacids, which improved symptoms.  However, as she had had symptoms, she checked her blood pressure, which was elevated.  States highest blood pressure was 195/114.  At this time, she is asymptomatic.  Denies chest pain, shortness of breath, one-sided weakness, dizziness, syncope. Denies exertional chest pain, exertional fatigue, dyspnea on exertion. Denies orthopnea, leg swelling.  States she takes HCTZ 25 mg daily.  She is currently on lisinopril 30 mg daily.  States has prescription for 40 mg daily lisinopril, but will not be able to fill it until next month. She is unsure goal for her BP.      Past Medical History:  Diagnosis Date  . Allergy   . Anxiety   . Arthritis    s/p R TKR  . Cyst of left kidney 2015   by lumbar MRI pending renal US  . DDD (degenerative disc disease), lumbar 03/2014   severe L2-3 with L HNP with L2/3 nerve root impingement [name])  . Depression   . Depression with anxiety   . Diabetes type 2, controlled ([place]) 2011   borderline  . Dyspnea   . Glaucoma   . History of ulcer disease   . HLD (hyperlipidemia)    no meds taken  . Hypertension   . PONV (postoperative nausea and vomiting)   . Ulcer     Patient Active Problem List   Diagnosis Date Noted  . Encounter for well adult exam with abnormal findings 01/07/2019  . Right-sided face pain 01/07/2019  . Temple tenderness 01/07/2019  . Trigger middle finger of right hand 11/12/2018  . Spondylosis of cervical region without myelopathy or radiculopathy 01/14/2018  . Chronic left shoulder pain 01/08/2018  .  Snoring 09/14/2017  . History of total right hip replacement 07/19/2017  . Rotator cuff tear 02/02/2017  . Acquired renal cyst of left kidney 01/01/2016  . Osteoarthritis of spine with radiculopathy, 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  . DDD (degenerative disc disease), lumbar 03/04/2014  . Lumbar facet joint pain 02/24/2014  . Plantar fasciitis of left foot 10/01/2013  . IBS (irritable bowel syndrome) 05/13/2013  . Hammer toe, acquired 02/11/2013  . Metatarsalgia of both feet 02/11/2013  . Obesity 11/13/2012  . Glaucoma   . Type 2 diabetes mellitus, controlled ([place])   . Anxiety and depression   . Vitreous degeneration, unspecified eye 10/25/2012  . HTN (hypertension) 01/06/2012  . GERD (gastroesophageal reflux disease) 01/06/2012  . Hypercholesteremia 01/06/2012    Past Surgical History:  Procedure Laterality Date  . ABDOMINAL HYSTERECTOMY  2007   fibroids, heavy bleeding  . ARTERY BIOPSY Right 01/23/2019   Procedure: REMOVAL OF PART OF RIGHT TEMPORAL ARTERY FOR BIOPSY;  Surgeon: Michael Boston, MD;  Location: [facility];  Service: General;  Laterality: Right;  . CATARACT EXTRACTION W/ INTRAOCULAR LENS IMPLANT Bilateral   . COLONOSCOPY  04/2017   TA, diverticulosis, rpt ? ([facility])  . GLAUCOMA SURGERY    . HAMMER TOE SURGERY Bilateral   . Right hip replacement    . TONSILLECTOMY    . TOTAL KNEE ARTHROPLASTY  Right 2004   TKR (Dr. Eddie Dibbles with [facility])  . TOTAL KNEE REVISION Right 03/2014   Dr Al [facility]    OB History    Gravida  0   Para      Term      Preterm      AB      Living  0     SAB      TAB      Ectopic      Multiple      Live Births               Home Medications    Prior to Admission medications   Medication Sig Start Date End Date Taking? Authorizing Provider  aspirin EC 81 MG tablet Take 81 mg by mouth every evening.    Yes [provider]  dorzolamide-timolol (COSOPT) 22.3-6.8 MG/ML ophthalmic solution Place 1 drop into both eyes 2 (two)  times daily. 01/07/19  Yes Biagio Borg, MD  glipiZIDE (GLUCOTROL XL) 2.5 MG 24 hr tablet TAKE 1 TABLET BY MOUTH DAILY WITH BREAKFAST 05/05/19  Yes Biagio Borg, MD  hydrochlorothiazide (HYDRODIURIL) 25 MG tablet TAKE 1 TABLET(25 MG) BY MOUTH DAILY 05/02/19  Yes Biagio Borg, MD  lisinopril (ZESTRIL) 40 MG tablet Take 1 tablet (40 mg total) by mouth daily. 06/24/19  Yes Biagio Borg, MD  MULTIPLE VITAMIN PO Take 1 tablet by mouth daily.   Yes [provider]  omeprazole (PRILOSEC) 20 MG capsule Take 20 mg by mouth daily as needed (heartburn).   Yes [provider]  Accu-Chek FastClix Lancets MISC USE TO TEST BLOOD SUGAR UP TO FOUR TIMES DAILY 11/25/18   Lance Sell, NP  acetaminophen (TYLENOL) 500 MG tablet Take 500 mg by mouth every 6 (six) hours as needed for moderate pain or headache.    [provider]  ALPRAZolam (XANAX) 0.25 MG tablet TAKE 1 TABLET(0.25 MG) BY MOUTH AT BEDTIME AS NEEDED FOR SLEEP Patient taking differently: Take 0.25 mg by mouth at bedtime as needed for sleep.  09/30/18   Lance Sell, NP  atorvastatin (LIPITOR) 40 MG tablet Take 1 tablet (40 mg total) by mouth daily. Patient taking differently: Take 40 mg by mouth at bedtime.  01/11/19   Biagio Borg, MD  blood glucose meter kit and supplies KIT Dispense based on patient and insurance preference. Use up to four times daily as directed. DX Code: E11.9 02/11/18   Lance Sell, NP  citalopram (CELEXA) 10 MG tablet Take 1 tablet (10 mg total) by mouth daily. Patient taking differently: Take 5 mg by mouth daily.  06/23/19 06/22/20  Biagio Borg, MD  glucose blood (ACCU-CHEK GUIDE) test strip USE AS DIRECTED TO TEST BLOOD GLUCOSE UP TO FOUR TIMES DAILY DX: E11.9 01/22/19   Biagio Borg, MD    Family History Family History  Problem Relation Age of Onset  . Cancer Maternal Grandmother 71       ovarian  . Diabetes Maternal Grandmother   . Hypertension Maternal Grandmother   .  Cancer Mother 63       bone, MM  . Stroke Other        unsure who  . CAD Neg Hx   . Colon cancer Neg Hx   . Esophageal cancer Neg Hx   . Rectal cancer Neg Hx   . Stomach cancer Neg Hx     Social History Social History   Tobacco Use  .  Smoking status: Never Smoker  . Smokeless tobacco: Never Used  Substance Use Topics  . Alcohol use: No  . Drug use: No     Allergies   Codeine, Metformin and related, and Oxycodone   Review of Systems Review of Systems  Reason unable to perform ROS: See HPI as above.     Physical Exam Triage Vital Signs ED Triage Vitals  Enc Vitals Group     BP 08/17/19 0903 (!) 152/96     Pulse Rate 08/17/19 0903 69     Resp 08/17/19 0903 16     Temp 08/17/19 0903 98.1 F (36.7 C)     Temp Source 08/17/19 0903 Oral     SpO2 08/17/19 0903 97 %     Weight --      Height --      Head Circumference --      Peak Flow --      Pain Score 08/17/19 0918 5     Pain Loc --      Pain Edu? --      Excl. in [place]? --    No data found.  Updated Vital Signs BP (!) 152/96 (BP Location: Left Arm)   Pulse 69   Temp 98.1 F (36.7 C) (Oral)   Resp 16   SpO2 97%   Visual Acuity Right Eye Distance:   Left Eye Distance:   Bilateral Distance:    Right Eye Near:   Left Eye Near:    Bilateral Near:     Physical Exam Constitutional:      General: She is not in acute distress.    Appearance: Normal appearance. She is not ill-appearing, toxic-appearing or diaphoretic.  HENT:     Head: Normocephalic and atraumatic.     Mouth/Throat:     Mouth: Mucous membranes are moist.     Pharynx: Oropharynx is clear. Uvula midline.  Eyes:     Extraocular Movements: Extraocular movements intact.     Conjunctiva/sclera: Conjunctivae normal.     Pupils: Pupils are equal, round, and reactive to light.  Cardiovascular:     Rate and Rhythm: Normal rate and regular rhythm.     Heart sounds: Normal heart sounds. No murmur. No friction rub. No gallop.   Pulmonary:      Effort: Pulmonary effort is normal. No accessory muscle usage, prolonged expiration, respiratory distress or retractions.     Comments: Lungs clear to auscultation without adventitious lung sounds. Musculoskeletal:     Cervical back: Normal range of motion and neck supple.  Neurological:     General: No focal deficit present.     Mental Status: She is alert and oriented to person, place, and time.     GCS: GCS eye subscore is 4. GCS verbal subscore is 5. GCS motor subscore is 6.     Coordination: Coordination is intact.     Gait: Gait is intact.     Comments: No facial drooping. No ataxia, aphasia.     UC Treatments / Results  Labs (all labs ordered are listed, but only abnormal results are displayed) Labs Reviewed - No data to display  EKG   Radiology No results found.  Procedures Procedures (including critical care time)  Medications Ordered in UC Medications - No data to display  Initial Impression / Assessment and Plan / UC Course  I have reviewed the triage vital signs and the nursing notes.  Pertinent labs & imaging results that were available during my care of the patient  were reviewed by me and considered in my medical decision making (see chart for details).    No alarming signs on exam.  BP 152/96 in office.  Patient asymptomatic without chest pain, shortness of breath, headache, weakness, dizziness, syncope.  At this time will have patient monitor blood pressure twice daily on HCTZ 25 mg and lisinopril 30 mg.  If BP still elevated in the next week, to call PCP or our office for readjustment needed. Push fluids. Return precautions given.  Patient expresses understanding and agrees to plan.  Final Clinical Impressions(s) / UC Diagnoses   Final diagnoses:  Essential hypertension   ED Prescriptions    None     PDMP not reviewed this encounter.   Ok Edwards, PA-C 08/17/19 1003

## 2019-08-17 NOTE — Discharge Instructions (Signed)
As discussed, no alarming signs on exam. At this time, continue your HCTZ 25mg  and lisinopril 30mg  as directed. Check your blood pressure twice daily, once first thing in the morning, second in the afternoon after sitting for about 10 mins. If blood pressure still high, contact PCP or [place] for further evaluation. Keep hydrated, urine should be clear to pale yellow in color. Decrease salt in diet. If experiencing chest pain, shortness of breath, weakness, dizziness, nausea/vomiting, passing out, go to the emergency department for further evaluation needed.

## 2019-08-17 NOTE — ED Triage Notes (Signed)
Here for HTN eval. Last night she checked it herself and it was 195/114. Takes lisinopril. Took normal dose with dinner. Felt "funny" later in the evening. Checked BP at midnight. Took a half dose of lisinopril at 12:30 am. Took a full dose this morning.   Is supposed to be taking 40 mg lisinopril for high BP days. She is out of this so she has been using 30 mg pills.   Also takes HCTZ.   She is feeling better this morning. She has a tension headache.

## 2019-08-25 ENCOUNTER — Ambulatory Visit (INDEPENDENT_AMBULATORY_CARE_PROVIDER_SITE_OTHER)
Admission: RE | Admit: 2019-08-25 | Discharge: 2019-08-25 | Disposition: A | Discharge status: Home / Self Care | Payer: Medicare Other | Source: Ambulatory Visit | Attending: Internal Medicine | Admitting: Internal Medicine

## 2019-08-25 ENCOUNTER — Inpatient Hospital Stay: Admission: RE | Admit: 2019-08-25 | Payer: Medicare Other | Source: Ambulatory Visit

## 2019-08-25 ENCOUNTER — Other Ambulatory Visit: Payer: Self-pay

## 2019-08-25 DIAGNOSIS — Z780 Asymptomatic menopausal state: Secondary | ICD-10-CM

## 2019-08-25 DIAGNOSIS — M7541 Impingement syndrome of right shoulder: Secondary | ICD-10-CM | POA: Diagnosis not present

## 2019-08-25 DIAGNOSIS — S46012S Strain of muscle(s) and tendon(s) of the rotator cuff of left shoulder, sequela: Secondary | ICD-10-CM | POA: Diagnosis not present

## 2019-08-26 ENCOUNTER — Other Ambulatory Visit: Payer: Self-pay | Admitting: Internal Medicine

## 2019-08-26 ENCOUNTER — Ambulatory Visit: Payer: Self-pay

## 2019-08-26 MED ORDER — GLIPIZIDE ER 2.5 MG PO TB24: 2.5000 mg | ORAL_TABLET | Freq: Every day | ORAL | 5 refills | Status: DC

## 2019-08-26 NOTE — Telephone Encounter (Signed)
Medication Refill - Medication:  glipiZIDE (GLUCOTROL XL) 2.5 MG 24 hr tablet   Has the patient contacted their pharmacy?  Yes advised to call office. Pharmacy did send fax. Pt is out of medication.   Preferred Pharmacy (with phone number or street name):  [facility] [place], [place] - [address] Phone:  [phone]  Fax:  [phone]     Agent: Please be advised that RX refills may take up to 3 business days. We ask that you follow-up with your pharmacy.

## 2019-08-26 NOTE — Telephone Encounter (Signed)
Pt. Reports she has had abdominal pain x 2 weeks, "seems to be getting worse." Hurts above belly button, and has bloating, constipation. No fever. Pain comes and goes. Warm transfer to [facility] in the practice for a visit.  Answer Assessment - Initial Assessment Questions 1. LOCATION: "Where does it hurt?"      Hurts upper abd. Above belly button 2. RADIATION: "Does the pain shoot anywhere else?" (e.g., chest, back)     No 3. ONSET: "When did the pain begin?" (e.g., minutes, hours or days ago)      2 weeks ago 4. SUDDEN: "Gradual or sudden onset?"     Gradual 5. PATTERN "Does the pain come and go, or is it constant?"    - If constant: "Is it getting better, staying the same, or worsening?"      (Note: Constant means the pain never goes away completely; most serious pain is constant and it progresses)     - If intermittent: "How long does it last?" "Do you have pain now?"     (Note: Intermittent means the pain goes away completely between bouts)     Comes and goes 6. SEVERITY: "How bad is the pain?"  (e.g., Scale 1-10; mild, moderate, or severe)   - MILD (1-3): doesn't interfere with normal activities, abdomen soft and not tender to touch    - MODERATE (4-7): interferes with normal activities or awakens from sleep, tender to touch    - SEVERE (8-10): excruciating pain, doubled over, unable to do any normal activities      8 7. RECURRENT SYMPTOM: "Have you ever had this type of abdominal pain before?" If so, ask: "When was the last time?" and "What happened that time?"      No 8. CAUSE: "What do you think is causing the abdominal pain?"     Unsure 9. RELIEVING/AGGRAVATING FACTORS: "What makes it better or worse?" (e.g., movement, antacids, bowel movement)     No 10. OTHER SYMPTOMS: "Has there been any vomiting, diarrhea, constipation, or urine problems?"       Some constipation 11. PREGNANCY: "Is there any chance you are pregnant?" "When was your last menstrual period?"       No  Protocols  used: ABDOMINAL PAIN - [facility]

## 2019-08-27 ENCOUNTER — Other Ambulatory Visit: Payer: Self-pay

## 2019-08-27 ENCOUNTER — Ambulatory Visit (INDEPENDENT_AMBULATORY_CARE_PROVIDER_SITE_OTHER): Payer: Medicare Other | Admitting: Internal Medicine

## 2019-08-27 ENCOUNTER — Encounter: Payer: Self-pay | Admitting: Internal Medicine

## 2019-08-27 VITALS — BP 144/94 | HR 64 | Temp 98.4°F | Ht 65.0 in | Wt 215.0 lb

## 2019-08-27 DIAGNOSIS — E7800 Pure hypercholesterolemia, unspecified: Secondary | ICD-10-CM

## 2019-08-27 DIAGNOSIS — F329 Major depressive disorder, single episode, unspecified: Secondary | ICD-10-CM

## 2019-08-27 DIAGNOSIS — F32A Depression, unspecified: Secondary | ICD-10-CM

## 2019-08-27 DIAGNOSIS — I10 Essential (primary) hypertension: Secondary | ICD-10-CM | POA: Diagnosis not present

## 2019-08-27 DIAGNOSIS — K589 Irritable bowel syndrome without diarrhea: Secondary | ICD-10-CM

## 2019-08-27 DIAGNOSIS — E119 Type 2 diabetes mellitus without complications: Secondary | ICD-10-CM | POA: Diagnosis not present

## 2019-08-27 DIAGNOSIS — F419 Anxiety disorder, unspecified: Secondary | ICD-10-CM

## 2019-08-27 MED ORDER — ATORVASTATIN CALCIUM 40 MG PO TABS: 40.0000 mg | ORAL_TABLET | ORAL | 3 refills | Status: DC

## 2019-08-27 MED ORDER — METOPROLOL SUCCINATE ER 50 MG PO TB24: 50.0000 mg | ORAL_TABLET | Freq: Every day | ORAL | 3 refills | Status: DC

## 2019-08-27 MED ORDER — DICYCLOMINE HCL 10 MG PO CAPS: 10.0000 mg | ORAL_CAPSULE | Freq: Three times a day (TID) | ORAL | 1 refills | Status: DC

## 2019-08-27 MED ORDER — CITALOPRAM HYDROBROMIDE 20 MG PO TABS: 20.0000 mg | ORAL_TABLET | Freq: Every day | ORAL | 3 refills | Status: DC

## 2019-08-27 NOTE — Patient Instructions (Signed)
Please take all new medication as prescribed - the generic bentyl for abdomen spasms, and metoprolol ER 50 mg daily for blood pressure  Ok to increase the celexa to 20 mg per day  OK to take the generic lipitor every other day  Please continue all other medications as before, and refills have been done if requested.  Please have the pharmacy call with any other refills you may need.  Please keep your appointments with your specialists as you may have planned  Please return in 6 months, or sooner if needed

## 2019-08-27 NOTE — Progress Notes (Signed)
Subjective:    Patient ID: Melissa [name], female    DOB: 06/15/1951, 68 y.o.   MRN: [id number]  HPI  Here with c/w chronic recurring >  3 mo worsening generalized abd carmpy pain with bloating and intermittent constipation, without fever, n/v, diarrhea or blood Pt denies chest pain, increased sob or doe, wheezing, orthopnea, PND, increased LE swelling, palpitations, dizziness or syncope.  .Pt denies new neurological symptoms such as new headache, or facial or extremity weakness or numbness   Pt denies polydipsia, polyuria, and can state she had  Oct a1c 6.8 at home.  Daily statin caused myalgias but is willing to try qod.  Denies worsening depressive symptoms, suicidal ideation, or panic; has ongoing anxiety, not increased recently. BP at home has been persistently mildly elevated however. Past Medical History:  Diagnosis Date  . Allergy   . Anxiety   . Arthritis    s/p R TKR  . Cyst of left kidney 2015   by lumbar MRI pending renal US  . DDD (degenerative disc disease), lumbar 03/2014   severe L2-3 with L HNP with L2/3 nerve root impingement [name])  . Depression   . Depression with anxiety   . Diabetes type 2, controlled ([place]) 2011   borderline  . Dyspnea   . Glaucoma   . History of ulcer disease   . HLD (hyperlipidemia)    no meds taken  . Hypertension   . PONV (postoperative nausea and vomiting)   . Ulcer    Past Surgical History:  Procedure Laterality Date  . ABDOMINAL HYSTERECTOMY  2007   fibroids, heavy bleeding  . ARTERY BIOPSY Right 01/23/2019   Procedure: REMOVAL OF PART OF RIGHT TEMPORAL ARTERY FOR BIOPSY;  Surgeon: Michael Boston, MD;  Location: [place];  Service: General;  Laterality: Right;  . CATARACT EXTRACTION W/ INTRAOCULAR LENS IMPLANT Bilateral   . COLONOSCOPY  04/2017   TA, diverticulosis, rpt ? ([facility])  . GLAUCOMA SURGERY    . HAMMER TOE SURGERY Bilateral   . Right hip replacement    . TONSILLECTOMY    . TOTAL KNEE ARTHROPLASTY Right 2004   TKR (Dr.  Eddie Dibbles with [facility])  . TOTAL KNEE REVISION Right 03/2014   Dr Al Corpus [facility]    reports that she has never smoked. She has never used smokeless tobacco. She reports that she does not drink alcohol or use drugs. family history includes Cancer (age of onset: 85) in her mother; Cancer (age of onset: 47) in her maternal grandmother; Diabetes in her maternal grandmother; Hypertension in her maternal grandmother; Stroke in an other family member. Allergies  Allergen Reactions  . Codeine Other (See Comments)    palpitations  . Metformin And Related Nausea Only  . Oxycodone Nausea And Vomiting   Current Outpatient Medications on File Prior to Visit  Medication Sig Dispense Refill  . Accu-Chek FastClix Lancets MISC USE TO TEST BLOOD SUGAR UP TO FOUR TIMES DAILY 306 each 1  . acetaminophen (TYLENOL) 500 MG tablet Take 500 mg by mouth every 6 (six) hours as needed for moderate pain or headache.    . ALPRAZolam (XANAX) 0.25 MG tablet TAKE 1 TABLET(0.25 MG) BY MOUTH AT BEDTIME AS NEEDED FOR SLEEP (Patient taking differently: Take 0.25 mg by mouth at bedtime as needed for sleep. ) 30 tablet 0  . aspirin EC 81 MG tablet Take 81 mg by mouth every evening.     . blood glucose meter kit and supplies KIT Dispense  based on patient and insurance preference. Use up to four times daily as directed. DX Code: E11.9 1 each 0  . dorzolamide-timolol (COSOPT) 22.3-6.8 MG/ML ophthalmic solution Place 1 drop into both eyes 2 (two) times daily. 10 mL 1  . glipiZIDE (GLUCOTROL XL) 2.5 MG 24 hr tablet Take 1 tablet (2.5 mg total) by mouth daily with breakfast. 30 tablet 5  . hydrochlorothiazide (HYDRODIURIL) 25 MG tablet TAKE 1 TABLET(25 MG) BY MOUTH DAILY 90 tablet 1  . lisinopril (ZESTRIL) 40 MG tablet Take 1 tablet (40 mg total) by mouth daily. 90 tablet 3  . MULTIPLE VITAMIN PO Take 1 tablet by mouth daily.    [name] omeprazole (PRILOSEC) 20 MG capsule Take 20 mg by mouth daily as needed (heartburn).    [name] glucose blood  (ACCU-CHEK GUIDE) test strip USE AS DIRECTED TO TEST BLOOD GLUCOSE UP TO FOUR TIMES DAILY DX: E11.9 100 each 3   No current facility-administered medications on file prior to visit.   Review of Systems  Constitutional: Negative for other unusual diaphoresis or sweats HENT: Negative for ear discharge or swelling Eyes: Negative for other worsening visual disturbances Respiratory: Negative for stridor or other swelling  Gastrointestinal: Negative for worsening distension or other blood Genitourinary: Negative for retention or other urinary change Musculoskeletal: Negative for other MSK pain or swelling Skin: Negative for color change or other new lesions Neurological: Negative for worsening tremors and other numbness  Psychiatric/Behavioral: Negative for worsening agitation or other fatigue All otherwise neg per pt     Objective:   Physical Exam BP (!) 144/94   Pulse 64   Temp 98.4 F (36.9 C)   Ht 5' 5"  (1.651 m)   Wt 215 lb (97.5 kg)   SpO2 98%   BMI 35.78 kg/m  VS noted,  Constitutional: Pt appears in NAD HENT: Head: NCAT.  Right Ear: External ear normal.  Left Ear: External ear normal.  Eyes: . Pupils are equal, round, and reactive to light. Conjunctivae and EOM are normal Nose: without d/c or deformity Neck: Neck supple. Gross normal ROM Cardiovascular: Normal rate and regular rhythm.   Pulmonary/Chest: Effort normal and breath sounds without rales or wheezing.  Abd:  Soft, NT, ND, + BS, no organomegaly Neurological: Pt is alert. At baseline orientation, motor grossly intact Skin: Skin is warm. No rashes, other new lesions, no LE edema Psychiatric: Pt behavior is normal without agitation , 2+ nervous All otherwise neg per pt  Lab Results  Component Value Date   WBC 5.6 01/09/2019   HGB 12.1 01/09/2019   HCT 37.0 01/09/2019   PLT 311.0 01/09/2019   GLUCOSE 109 (H) 01/09/2019   CHOL 242 (H) 01/09/2019   TRIG 151.0 (H) 01/09/2019   HDL 61.80 01/09/2019    LDLDIRECT 183.0 09/18/2017   LDLCALC 150 (H) 01/09/2019   ALT 19 01/09/2019   AST 18 01/09/2019   NA 140 01/09/2019   K 3.8 01/09/2019   CL 103 01/09/2019   CREATININE 0.74 01/09/2019   BUN 15 01/09/2019   CO2 28 01/09/2019   TSH 1.34 01/09/2019   INR 1.01 01/06/2012   HGBA1C 6.6 (H) 01/09/2019   MICROALBUR 1.3 01/09/2019       Assessment & Plan:

## 2019-09-03 ENCOUNTER — Telehealth: Payer: Self-pay | Admitting: Internal Medicine

## 2019-09-03 DIAGNOSIS — F419 Anxiety disorder, unspecified: Secondary | ICD-10-CM

## 2019-09-03 DIAGNOSIS — F32A Depression, unspecified: Secondary | ICD-10-CM

## 2019-09-03 DIAGNOSIS — F329 Major depressive disorder, single episode, unspecified: Secondary | ICD-10-CM

## 2019-09-03 NOTE — Telephone Encounter (Signed)
ALPRAZolam (XANAX) 0.25 MG tablet    Patient requesting a refill.    Pharmacy:  [facility] [place], [place] Phone:  [phone]  Fax:  [phone]

## 2019-09-05 HISTORY — PX: TOTAL HIP ARTHROPLASTY: SHX124

## 2019-09-06 ENCOUNTER — Encounter: Payer: Self-pay | Admitting: Internal Medicine

## 2019-09-06 NOTE — Assessment & Plan Note (Signed)
Ok for increased celexa 20 qd

## 2019-09-06 NOTE — Assessment & Plan Note (Signed)
Ok to start taking statin qod,  to f/u any worsening symptoms or concerns

## 2019-09-06 NOTE — Assessment & Plan Note (Signed)
stable overall by history and exam, recent data reviewed with pt, and pt to continue medical treatment as before,  to f/u any worsening symptoms or concerns  

## 2019-09-06 NOTE — Assessment & Plan Note (Signed)
Mild uncontrolled, to add toprol xl 50 qd, cont to f/u bp at home and next visit

## 2019-09-06 NOTE — Assessment & Plan Note (Addendum)
Benign exam, c/w functional issue, for bentyl prn,  to f/u any worsening symptoms or concerns  Note:  Total time for pt hx, exam, review of record with pt in the room, determination of diagnoses and plan for further eval and tx is > 40 min, with over 50% spent in coordination and counseling of patient including the differential dx, tx, further evaluation and other management of IBS, anxiety, depression, HTN, HLD, DM,

## 2019-09-08 MED ORDER — ALPRAZOLAM 0.25 MG PO TABS: ORAL_TABLET | ORAL | 0 refills | Status: DC

## 2019-09-08 NOTE — Telephone Encounter (Signed)
Done erx 

## 2019-09-08 NOTE — Telephone Encounter (Signed)
Last OV--08/27/19 Last refilled--09/30/18 by NP Please refiiled

## 2019-09-09 NOTE — Telephone Encounter (Signed)
Called LVM informing prescription has been sent to pharmacy

## 2019-09-18 DIAGNOSIS — M25511 Pain in right shoulder: Secondary | ICD-10-CM | POA: Diagnosis not present

## 2019-09-18 DIAGNOSIS — M75121 Complete rotator cuff tear or rupture of right shoulder, not specified as traumatic: Secondary | ICD-10-CM | POA: Diagnosis not present

## 2019-09-18 DIAGNOSIS — S46011A Strain of muscle(s) and tendon(s) of the rotator cuff of right shoulder, initial encounter: Secondary | ICD-10-CM | POA: Diagnosis not present

## 2019-10-09 ENCOUNTER — Ambulatory Visit (INDEPENDENT_AMBULATORY_CARE_PROVIDER_SITE_OTHER): Payer: Medicare Other | Admitting: Internal Medicine

## 2019-10-09 ENCOUNTER — Encounter: Payer: Self-pay | Admitting: Internal Medicine

## 2019-10-09 ENCOUNTER — Other Ambulatory Visit: Payer: Self-pay

## 2019-10-09 VITALS — BP 152/90 | HR 64 | Temp 98.1°F | Ht 65.0 in | Wt 212.1 lb

## 2019-10-09 DIAGNOSIS — N644 Mastodynia: Secondary | ICD-10-CM

## 2019-10-09 NOTE — Assessment & Plan Note (Signed)
May be related to old injury. Recent mammogram normal. Will order diagnostic and [place] to assess area in question.

## 2019-10-09 NOTE — Progress Notes (Signed)
   Subjective:   Patient ID: Melissa [name], female    DOB: 1951/08/08, 69 y.o.   MRN: [id number]  HPI The patient is a 69 YO female coming in for breast pain. Started about 1 year or so ago. The pain is a flash going into the left breast. Lasts a second or two. Overall it is stable but not improving. Has tried nothing. Denies change in size or color or shape of the breast. No nipple discharge. Most recent mammogram 06/02/19 without findings. No dense breast tissue noted. Prior injury to that area multiple years ago and not consistent with when the pain started.   Review of Systems  Constitutional: Negative.   HENT: Negative.   Eyes: Negative.   Respiratory: Negative for cough, chest tightness and shortness of breath.   Cardiovascular: Negative for chest pain, palpitations and leg swelling.  Gastrointestinal: Negative for abdominal distention, abdominal pain, constipation, diarrhea, nausea and vomiting.  Genitourinary:       Breast pain  Musculoskeletal: Negative.   Skin: Negative.   Neurological: Negative.   Psychiatric/Behavioral: Negative.     Objective:  Physical Exam Constitutional:      Appearance: She is well-developed.  HENT:     Head: Normocephalic and atraumatic.  Cardiovascular:     Rate and Rhythm: Normal rate and regular rhythm.     Comments: Left and right breast with normal skin, shape, symmetry. No masses appreciated left or right breast or axillary right or left. There is an area 9 o'clock left breast with tenderness to palpation. Pulmonary:     Effort: Pulmonary effort is normal. No respiratory distress.     Breath sounds: Normal breath sounds. No wheezing or rales.  Abdominal:     General: Bowel sounds are normal. There is no distension.     Palpations: Abdomen is soft.     Tenderness: There is no abdominal tenderness. There is no rebound.  Musculoskeletal:     Cervical back: Normal range of motion.  Skin:    General: Skin is warm and dry.  Neurological:      Mental Status: She is alert and oriented to person, place, and time.     Coordination: Coordination normal.     Vitals:   10/09/19 1047  BP: (!) 152/90  Pulse: 64  Temp: 98.1 F (36.7 C)  TempSrc: Oral  SpO2: 98%  Weight: 212 lb 2 oz (96.2 kg)  Height: 5\' 5"  (1.651 m)    This visit occurred during the SARS-CoV-2 public health emergency.  Safety protocols were in place, including screening questions prior to the visit, additional usage of staff PPE, and extensive cleaning of exam room while observing appropriate contact time as indicated for disinfecting solutions.   Assessment & Plan:

## 2019-10-09 NOTE — Patient Instructions (Signed)
We will get a second check of the breast to make sure there is not a problem there.

## 2019-10-13 ENCOUNTER — Telehealth: Payer: Self-pay

## 2019-10-13 NOTE — Telephone Encounter (Signed)
New message  The patient C/o  frequency Urination to bathroom at night asking for blood drawn to determine what's wrong.

## 2019-10-14 ENCOUNTER — Encounter: Payer: Self-pay | Admitting: Internal Medicine

## 2019-10-14 ENCOUNTER — Ambulatory Visit: Payer: Medicare Other | Admitting: Family

## 2019-10-14 ENCOUNTER — Other Ambulatory Visit: Payer: Self-pay

## 2019-10-14 ENCOUNTER — Ambulatory Visit (INDEPENDENT_AMBULATORY_CARE_PROVIDER_SITE_OTHER): Payer: Medicare Other | Admitting: Internal Medicine

## 2019-10-14 VITALS — BP 146/100 | HR 65 | Temp 98.4°F | Resp 16 | Ht 65.0 in | Wt 213.0 lb

## 2019-10-14 DIAGNOSIS — N3281 Overactive bladder: Secondary | ICD-10-CM | POA: Diagnosis not present

## 2019-10-14 DIAGNOSIS — R350 Frequency of micturition: Secondary | ICD-10-CM

## 2019-10-14 DIAGNOSIS — E119 Type 2 diabetes mellitus without complications: Secondary | ICD-10-CM

## 2019-10-14 DIAGNOSIS — I10 Essential (primary) hypertension: Secondary | ICD-10-CM | POA: Diagnosis not present

## 2019-10-14 HISTORY — DX: Overactive bladder: N32.81

## 2019-10-14 LAB — URINALYSIS, ROUTINE W REFLEX MICROSCOPIC
Bilirubin Urine: NEGATIVE
Hgb urine dipstick: NEGATIVE
Ketones, ur: NEGATIVE
Leukocytes,Ua: NEGATIVE
Nitrite: NEGATIVE
RBC / HPF: NONE SEEN (ref 0–?)
Specific Gravity, Urine: 1.025 (ref 1.000–1.030)
Total Protein, Urine: NEGATIVE
Urine Glucose: NEGATIVE
Urobilinogen, UA: 0.2 (ref 0.0–1.0)
pH: 5 (ref 5.0–8.0)

## 2019-10-14 LAB — POCT GLYCOSYLATED HEMOGLOBIN (HGB A1C): Hemoglobin A1C: 5.8 % — AB (ref 4.0–5.6)

## 2019-10-14 LAB — BASIC METABOLIC PANEL
BUN: 20 mg/dL (ref 6–23)
CO2: 28 mEq/L (ref 19–32)
Calcium: 9.8 mg/dL (ref 8.4–10.5)
Chloride: 108 mEq/L (ref 96–112)
Creatinine, Ser: 0.82 mg/dL (ref 0.40–1.20)
GFR: 83.62 mL/min (ref >60.00)
Glucose, Bld: 86 mg/dL (ref 70–99)
Potassium: 3.5 mEq/L (ref 3.5–5.1)
Sodium: 143 mEq/L (ref 135–145)

## 2019-10-14 LAB — POCT GLUCOSE (DEVICE FOR HOME USE): Glucose Fasting, POC: 124 mg/dL — AB (ref 70–99)

## 2019-10-14 MED ORDER — SOLIFENACIN SUCCINATE 5 MG PO TABS: 5.0000 mg | ORAL_TABLET | Freq: Every day | ORAL | 1 refills | Status: DC

## 2019-10-14 NOTE — Patient Instructions (Signed)

## 2019-10-14 NOTE — Progress Notes (Signed)
Subjective:  Patient ID: Melissa [name], female    DOB: 03-Apr-1951  Age: 69 y.o. MRN: [id number]  CC: Hypertension and Diabetes  This visit occurred during the SARS-CoV-2 public health emergency.  Safety protocols were in place, including screening questions prior to the visit, additional usage of staff PPE, and extensive cleaning of exam room while observing appropriate contact time as indicated for disinfecting solutions.    HPI Melissa [name] presents for f/up - She complains of frequent urination and urgency.  She thought it was caused by hydrochlorothiazide so she stopped taking it a few weeks ago.  The urinary frequency has persisted.  She tells me her blood sugars have been well controlled.  Outpatient Medications Prior to Visit  Medication Sig Dispense Refill  . Accu-Chek FastClix Lancets MISC USE TO TEST BLOOD SUGAR UP TO FOUR TIMES DAILY 306 each 1  . acetaminophen (TYLENOL) 500 MG tablet Take 500 mg by mouth every 6 (six) hours as needed for moderate pain or headache.    . ALPRAZolam (XANAX) 0.25 MG tablet TAKE 1 TABLET(0.25 MG) BY MOUTH AT BEDTIME AS NEEDED FOR SLEEP 30 tablet 0  . aspirin EC 81 MG tablet Take 81 mg by mouth every evening.     [name] atorvastatin (LIPITOR) 40 MG tablet Take 1 tablet (40 mg total) by mouth every other day. 45 tablet 3  . blood glucose meter kit and supplies KIT Dispense based on patient and insurance preference. Use up to four times daily as directed. DX Code: E11.9 1 each 0  . citalopram (CELEXA) 20 MG tablet Take 1 tablet (20 mg total) by mouth daily. 90 tablet 3  . dicyclomine (BENTYL) 10 MG capsule Take 1 capsule (10 mg total) by mouth 4 (four) times daily -  before meals and at bedtime. 90 capsule 1  . dorzolamide-timolol (COSOPT) 22.3-6.8 MG/ML ophthalmic solution Place 1 drop into both eyes 2 (two) times daily. 10 mL 1  . glucose blood (ACCU-CHEK GUIDE) test strip USE AS DIRECTED TO TEST BLOOD GLUCOSE UP TO FOUR TIMES DAILY DX: E11.9 100  each 3  . lisinopril (ZESTRIL) 40 MG tablet Take 1 tablet (40 mg total) by mouth daily. 90 tablet 3  . metoprolol succinate (TOPROL-XL) 50 MG 24 hr tablet Take 1 tablet (50 mg total) by mouth daily. Take with or immediately following a meal. 90 tablet 3  . MULTIPLE VITAMIN PO Take 1 tablet by mouth daily.    [name] omeprazole (PRILOSEC) 20 MG capsule Take 20 mg by mouth daily as needed (heartburn).    [name] glipiZIDE (GLUCOTROL XL) 2.5 MG 24 hr tablet Take 1 tablet (2.5 mg total) by mouth daily with breakfast. 30 tablet 5  . hydrochlorothiazide (HYDRODIURIL) 25 MG tablet TAKE 1 TABLET(25 MG) BY MOUTH DAILY (Patient not taking: Reported on 10/14/2019) 90 tablet 1   No facility-administered medications prior to visit.    ROS Review of Systems  Constitutional: Negative for diaphoresis and fatigue.  HENT: Negative.   Eyes: Negative.   Respiratory: Negative for cough, chest tightness, shortness of breath and wheezing.   Cardiovascular: Negative for chest pain, palpitations and leg swelling.  Gastrointestinal: Negative for abdominal pain, diarrhea, nausea and vomiting.  Endocrine: Positive for polyuria. Negative for polydipsia and polyphagia.  Genitourinary: Positive for frequency. Negative for decreased urine volume, difficulty urinating, dysuria and hematuria.  Musculoskeletal: Negative.   Skin: Negative.   Neurological: Negative for dizziness, weakness and light-headedness.  Hematological: Negative for adenopathy. Does not bruise/bleed easily.  Psychiatric/Behavioral: Negative.     Objective:  BP (!) 146/100 (BP Location: Left Arm, Patient Position: Sitting, Cuff Size: Large)   Pulse 65   Temp 98.4 F (36.9 C) (Oral)   Resp 16   Ht 5' 5"  (1.651 m)   Wt 213 lb (96.6 kg)   SpO2 98%   BMI 35.45 kg/m   BP Readings from Last 3 Encounters:  10/14/19 (!) 146/100  10/09/19 (!) 152/90  08/27/19 (!) 144/94    Wt Readings from Last 3 Encounters:  10/14/19 213 lb (96.6 kg)  10/09/19 212 lb 2 oz  (96.2 kg)  08/27/19 215 lb (97.5 kg)    Physical Exam Vitals reviewed.  HENT:     Nose: Nose normal.     Mouth/Throat:     Pharynx: Oropharynx is clear.  Eyes:     General: No scleral icterus.    Conjunctiva/sclera: Conjunctivae normal.  Cardiovascular:     Rate and Rhythm: Normal rate and regular rhythm.     Heart sounds: No murmur.  Pulmonary:     Effort: Pulmonary effort is normal.     Breath sounds: No stridor. No wheezing, rhonchi or rales.  Abdominal:     General: Abdomen is flat. Bowel sounds are normal. There is no distension.     Palpations: There is no hepatomegaly, splenomegaly or mass.     Tenderness: There is no abdominal tenderness.  Musculoskeletal:        General: Normal range of motion.     Cervical back: Neck supple.     Right lower leg: No edema.     Left lower leg: No edema.  Lymphadenopathy:     Cervical: No cervical adenopathy.  Skin:    General: Skin is warm and dry.  Neurological:     General: No focal deficit present.  Psychiatric:        Mood and Affect: Mood normal.        Behavior: Behavior normal.     Lab Results  Component Value Date   WBC 5.6 01/09/2019   HGB 12.1 01/09/2019   HCT 37.0 01/09/2019   PLT 311.0 01/09/2019   GLUCOSE 86 10/14/2019   CHOL 242 (H) 01/09/2019   TRIG 151.0 (H) 01/09/2019   HDL 61.80 01/09/2019   LDLDIRECT 183.0 09/18/2017   LDLCALC 150 (H) 01/09/2019   ALT 19 01/09/2019   AST 18 01/09/2019   NA 143 10/14/2019   K 3.5 10/14/2019   CL 108 10/14/2019   CREATININE 0.82 10/14/2019   BUN 20 10/14/2019   CO2 28 10/14/2019   TSH 1.34 01/09/2019   INR 1.01 01/06/2012   HGBA1C 5.8 (A) 10/14/2019   MICROALBUR 1.3 01/09/2019    DEXAScan  Result Date: 08/26/2019 Date of study: 08/25/2019 Exam: DUAL X-RAY ABSORPTIOMETRY (DXA) FOR BONE MINERAL DENSITY (BMD) Instrument: [employer] Provider: PCP Indication: screening for osteoporosis Comparison: none (please note that it is not possible to  compare data from different instruments) Clinical data: Pt is a 69 y.o. female with previous reported elbow fracture. Results:  Lumbar spine L1-L2 (L3, L4) Femoral neck (FN) 33% distal radius T-score  +1.7 RFN: n/a LFN: n/a -0.1 Assessment: the BMD is normal according to the [facility] classification for osteoporosis (see below). Fracture risk: low FRAX score: not calculated due to normal BMD Comments: the technical quality of the study is good, however, L3 and L4 vertebrae could not be analyzed due to degenerative changes.  Also, hips could not be analyzed due to  bilateral total hip replacement. Recommend optimizing calcium (1200 mg/day) and vitamin D (800 IU/day) intake. No pharmacological treatment is indicated. Followup: Repeat BMD is appropriate after 2 years. WHO criteria for diagnosis of osteoporosis in postmenopausal women and in men 57 y/o or older: - normal: T-score -1.0 to + 1.0 - osteopenia/low bone density: T-score between -2.5 and -1.0 - osteoporosis: T-score below -2.5 - severe osteoporosis: T-score below -2.5 with history of fragility fracture Note: although not part of the WHO classification, the presence of a fragility fracture, regardless of the T-score, should be considered diagnostic of osteoporosis, provided other causes for the fracture have been excluded. Philemon Kingdom, MD [facility]   Assessment & Plan:   Reighlynn was seen today for hypertension and diabetes.  Diagnoses and all orders for this visit:  Essential hypertension- Her blood pressure is not adequately well controlled.  I recommended that she be compliant with hydrochlorothiazide.  Controlled type 2 diabetes mellitus without complication, without long-term current use of insulin ([place])- Her A1c is down to 5.8%.  Her frequency is not related to hyperglycemia.  I recommended that she stop taking the sulfonylurea. -     POCT glycosylated hemoglobin (Hb A1C) -     POCT Glucose (Device for Home Use)  Frequent  urination- Work-up for secondary causes is unremarkable.  I think she has overactive bladder.  OAB (overactive bladder)- Will treat this with Vesicare.  Will increase the dose to 10 mg if needed and will consider adding on Myrbetriq if needed.   I have discontinued Melissa [name]'s glipiZIDE. I am also having her start on solifenacin. Additionally, I am having her maintain her aspirin EC, blood glucose meter kit and supplies, Accu-Chek FastClix Lancets, dorzolamide-timolol, acetaminophen, omeprazole, glucose blood, hydrochlorothiazide, lisinopril, MULTIPLE VITAMIN PO, dicyclomine, metoprolol succinate, citalopram, atorvastatin, and ALPRAZolam.  Meds ordered this encounter  Medications  . solifenacin (VESICARE) 5 MG tablet    Sig: Take 1 tablet (5 mg total) by mouth daily.    Dispense:  90 tablet    Refill:  1     Follow-up: Return in about 3 months (around 01/11/2020).  Scarlette Calico, MD

## 2019-10-15 LAB — CULTURE, URINE COMPREHENSIVE

## 2019-10-16 ENCOUNTER — Encounter: Payer: Self-pay | Admitting: Internal Medicine

## 2019-10-27 ENCOUNTER — Other Ambulatory Visit: Payer: Self-pay | Admitting: Internal Medicine

## 2019-10-27 NOTE — Telephone Encounter (Signed)
Please refill as per office routine med refill policy (all routine meds refilled for 3 mo or monthly per pt preference up to one year from last visit, then month to month grace period for 3 mo, then further med refills will have to be denied)  

## 2019-10-27 NOTE — Telephone Encounter (Signed)
No need for this restart as her last a1c was normal

## 2019-10-27 NOTE — Telephone Encounter (Signed)
Patient is requesting a refill on the following medication: glipiZIDE (GLUCOTROL XL) 2.5 MG 24 hr tablet She is requesting it be wrote for 1 times in the morning and 1 time in the evening.   [facility] [place], [place] Phone:  [phone]  Fax:  [phone]

## 2019-10-28 NOTE — Telephone Encounter (Signed)
Called patient and discussed labs. Patient voiced understanding

## 2019-10-28 NOTE — Telephone Encounter (Signed)
    Please return call to patient to discuss A1C

## 2019-10-30 ENCOUNTER — Other Ambulatory Visit: Payer: Self-pay

## 2019-10-30 ENCOUNTER — Ambulatory Visit
Admission: RE | Admit: 2019-10-30 | Discharge: 2019-10-30 | Disposition: A | Discharge status: Home / Self Care | Payer: Medicare Other | Source: Ambulatory Visit | Attending: Internal Medicine | Admitting: Internal Medicine

## 2019-10-30 DIAGNOSIS — N644 Mastodynia: Secondary | ICD-10-CM

## 2019-10-30 DIAGNOSIS — R928 Other abnormal and inconclusive findings on diagnostic imaging of breast: Secondary | ICD-10-CM | POA: Diagnosis not present

## 2019-11-05 DIAGNOSIS — H401133 Primary open-angle glaucoma, bilateral, severe stage: Secondary | ICD-10-CM | POA: Diagnosis not present

## 2019-11-05 DIAGNOSIS — E119 Type 2 diabetes mellitus without complications: Secondary | ICD-10-CM | POA: Diagnosis not present

## 2019-11-05 DIAGNOSIS — H53432 Sector or arcuate defects, left eye: Secondary | ICD-10-CM | POA: Diagnosis not present

## 2019-11-12 ENCOUNTER — Other Ambulatory Visit: Payer: Self-pay | Admitting: Internal Medicine

## 2019-11-17 ENCOUNTER — Telehealth: Payer: Self-pay

## 2019-11-17 DIAGNOSIS — R351 Nocturia: Secondary | ICD-10-CM

## 2019-11-17 NOTE — Telephone Encounter (Signed)
Please advise 

## 2019-11-17 NOTE — Telephone Encounter (Signed)
Done referral

## 2019-11-17 NOTE — Telephone Encounter (Signed)
New message    The patient needs a referral to a Kidney specialist - reason Urinate every two hours at night.   [facility].

## 2019-12-04 ENCOUNTER — Other Ambulatory Visit: Payer: Self-pay | Admitting: Internal Medicine

## 2019-12-04 DIAGNOSIS — F32A Depression, unspecified: Secondary | ICD-10-CM

## 2019-12-04 DIAGNOSIS — F419 Anxiety disorder, unspecified: Secondary | ICD-10-CM

## 2019-12-04 DIAGNOSIS — F329 Major depressive disorder, single episode, unspecified: Secondary | ICD-10-CM

## 2019-12-04 NOTE — Telephone Encounter (Signed)
Done erx 

## 2019-12-31 ENCOUNTER — Telehealth: Payer: Self-pay | Admitting: Internal Medicine

## 2019-12-31 NOTE — Telephone Encounter (Signed)
New message:   1.Medication Requested: Glizide 25 2. Pharmacy (Name, Street, [place]): [facility] [place], [place] 3. On Med List: No  4. Last Visit with PCP: 08/27/19  5. Next visit date with PCP: None  Pt is stating that the last time she saw Dr. Ronnald Ramp due to Dr. [name] being unavailable he took her off this medication but pt stated she has continued to take it and now needs a refill. Agent: Please be advised that RX refills may take up to 3 business days. We ask that you follow-up with your pharmacy.

## 2020-01-01 NOTE — Telephone Encounter (Signed)
The reason the glipizide was stopped was due to the excellent a1c at the time and the risk of low sugars taking this  We would need to see documentation of high sugars or increased a1c again to consider restarting;  if she can check her sugars 2-4 times per day and report the values next week, that would be great  I am assuming she is not having an acute problem to make her sugars higher too such as some illness that makes her sugars temporarily elevated or something like poor diet over the past weekend that  could be improved with better diet  Ok for ROV if she needs to discuss

## 2020-01-02 DIAGNOSIS — M12811 Other specific arthropathies, not elsewhere classified, right shoulder: Secondary | ICD-10-CM | POA: Diagnosis not present

## 2020-01-02 NOTE — Telephone Encounter (Signed)
Spoke with Melissa [name]. Informed her of Dr. Gwynn Burly note and instructions. *Melissa [name] understood and has no questions or concerns at this time.

## 2020-01-11 ENCOUNTER — Other Ambulatory Visit: Payer: Self-pay | Admitting: Internal Medicine

## 2020-01-11 NOTE — Telephone Encounter (Signed)
Please refill as per office routine med refill policy (all routine meds refilled for 3 mo or monthly per pt preference up to one year from last visit, then month to month grace period for 3 mo, then further med refills will have to be denied)  

## 2020-01-14 ENCOUNTER — Other Ambulatory Visit: Payer: Self-pay

## 2020-01-14 ENCOUNTER — Other Ambulatory Visit: Payer: Self-pay | Admitting: Internal Medicine

## 2020-01-14 NOTE — Telephone Encounter (Signed)
New message:    1.Medication Requested: glizide 2. Pharmacy (Name, Street, [place]): [facility], [place] 3. On Med List:   4. Last Visit with PCP:   5. Next visit date with PCP:  Pt states she is currently all the way out of her insulin  Blood Sugar Readings: Pt states she was asked to record her readings for a week. 01/05/20:87 01/06/20:6 01/07/20:101 01/08/20:79 01/09/20:145 01/14/20-180 Agent: Please be advised that RX refills may take up to 3 business days. We ask that you follow-up with your pharmacy.

## 2020-01-14 NOTE — Telephone Encounter (Signed)
New message:   Pt is calling back to check on the status of her prescription refill. She states her blood sugar is high and she has no insulin. Please advise.

## 2020-01-15 NOTE — Telephone Encounter (Signed)
Oh ok, so continue to monitor sugars and bring to her next appt; sounds like possibly she may need further oral medication if sugars are back to being elevated

## 2020-01-15 NOTE — Telephone Encounter (Signed)
Left message for patient to call office back and make appointment for follow up with Dr. [name].  She needs to monitor her readings and bring them in with her so Dr. [name] can gauge what readings she has been getting. Also left message that he has openings tomorrow if she would like to come in.

## 2020-01-15 NOTE — Telephone Encounter (Signed)
I am very confused as this would be very strange to prescribe new insulin for a patient on request from an email, especially without supporting documentation if there is any from another provider  Please consider OV if she feels she needs this, as I have not prescribed this in the past for her to my recall

## 2020-01-16 ENCOUNTER — Other Ambulatory Visit: Payer: Self-pay

## 2020-01-16 ENCOUNTER — Encounter: Payer: Self-pay | Admitting: Internal Medicine

## 2020-01-16 ENCOUNTER — Ambulatory Visit (INDEPENDENT_AMBULATORY_CARE_PROVIDER_SITE_OTHER): Payer: Medicare Other | Admitting: Internal Medicine

## 2020-01-16 VITALS — BP 172/100 | HR 62 | Temp 99.1°F | Ht 65.0 in | Wt 219.0 lb

## 2020-01-16 DIAGNOSIS — N3281 Overactive bladder: Secondary | ICD-10-CM | POA: Diagnosis not present

## 2020-01-16 DIAGNOSIS — R509 Fever, unspecified: Secondary | ICD-10-CM

## 2020-01-16 DIAGNOSIS — F329 Major depressive disorder, single episode, unspecified: Secondary | ICD-10-CM

## 2020-01-16 DIAGNOSIS — E119 Type 2 diabetes mellitus without complications: Secondary | ICD-10-CM | POA: Diagnosis not present

## 2020-01-16 DIAGNOSIS — F32A Depression, unspecified: Secondary | ICD-10-CM

## 2020-01-16 DIAGNOSIS — I10 Essential (primary) hypertension: Secondary | ICD-10-CM | POA: Diagnosis not present

## 2020-01-16 DIAGNOSIS — F419 Anxiety disorder, unspecified: Secondary | ICD-10-CM

## 2020-01-16 LAB — POCT GLYCOSYLATED HEMOGLOBIN (HGB A1C): Hemoglobin A1C: 6.1 % — AB (ref 4.0–5.6)

## 2020-01-16 MED ORDER — GLIPIZIDE ER 2.5 MG PO TB24: ORAL_TABLET | ORAL | 3 refills | Status: DC

## 2020-01-16 MED ORDER — METOPROLOL SUCCINATE ER 50 MG PO TB24
75.0000 mg | ORAL_TABLET | Freq: Every day | ORAL | 3 refills | Status: DC
Start: 2020-01-16 — End: 2021-05-11

## 2020-01-16 NOTE — Progress Notes (Signed)
Subjective:    Patient ID: Melissa [name], female    DOB: 02/28/1951, 69 y.o.   MRN: [id number]  HPI  Here to f/u, pt very nervous and near panic regarding need for anti diabetic medication after recent OHA stopped.   Pt denies polydipsia, polyuria, or low sugar symptoms such as weakness or confusion improved with po intake, and has not yet started the glipizide. Denies fever, Pt denies chest pain, increased sob or doe, wheezing, orthopnea, PND, increased LE swelling, palpitations, dizziness or syncope.  Pt denies new neurological symptoms such as new headache, or facial or extremity weakness or numbness   Denies urinary symptoms such as dysuria, frequency, urgency, flank pain, hematuria or n/v, fever, chills - the OAB medication working well. BP Readings from Last 3 Encounters:  01/16/20 (!) 172/100  10/14/19 (!) 146/100  10/09/19 (!) 152/90   Past Medical History:  Diagnosis Date  . Allergy   . Anxiety   . Arthritis    s/p R TKR  . Cyst of left kidney 2015   by lumbar MRI pending renal US  . DDD (degenerative disc disease), lumbar 03/2014   severe L2-3 with L HNP with L2/3 nerve root impingement [name])  . Depression   . Depression with anxiety   . Diabetes type 2, controlled ([place]) 2011   borderline  . Dyspnea   . Glaucoma   . History of ulcer disease   . HLD (hyperlipidemia)    no meds taken  . Hypertension   . PONV (postoperative nausea and vomiting)   . Ulcer    Past Surgical History:  Procedure Laterality Date  . ABDOMINAL HYSTERECTOMY  2007   fibroids, heavy bleeding  . ARTERY BIOPSY Right 01/23/2019   Procedure: REMOVAL OF PART OF RIGHT TEMPORAL ARTERY FOR BIOPSY;  Surgeon: Michael Boston, MD;  Location: [facility];  Service: General;  Laterality: Right;  . CATARACT EXTRACTION W/ INTRAOCULAR LENS IMPLANT Bilateral   . COLONOSCOPY  04/2017   TA, diverticulosis, rpt ? ([facility])  . GLAUCOMA SURGERY    . HAMMER TOE SURGERY Bilateral   . Right hip replacement    .  TONSILLECTOMY    . TOTAL KNEE ARTHROPLASTY Right 2004   TKR (Dr. Eddie Dibbles with [facility])  . TOTAL KNEE REVISION Right 03/2014   Dr Al Corpus [facility]    reports that she has never smoked. She has never used smokeless tobacco. She reports that she does not drink alcohol or use drugs. family history includes Cancer (age of onset: 29) in her mother; Cancer (age of onset: 29) in her maternal grandmother; Diabetes in her maternal grandmother; Hypertension in her maternal grandmother; Stroke in an other family member. Allergies  Allergen Reactions  . Codeine Other (See Comments)    palpitations  . Metformin And Related Nausea Only  . Oxycodone Nausea And Vomiting   Current Outpatient Medications on File Prior to Visit  Medication Sig Dispense Refill  . Accu-Chek FastClix Lancets MISC USE TO TEST BLOOD SUGAR UP TO FOUR TIMES DAILY 306 each 1  . acetaminophen (TYLENOL) 500 MG tablet Take 500 mg by mouth every 6 (six) hours as needed for moderate pain or headache.    . ALPRAZolam (XANAX) 0.25 MG tablet TAKE 1 TABLET(0.25 MG) BY MOUTH AT BEDTIME AS NEEDED FOR SLEEP 30 tablet 5  . aspirin EC 81 MG tablet Take 81 mg by mouth every evening.     . blood glucose meter kit and supplies KIT Dispense based on  patient and insurance preference. Use up to four times daily as directed. DX Code: E11.9 1 each 0  . citalopram (CELEXA) 20 MG tablet Take 1 tablet (20 mg total) by mouth daily. 90 tablet 3  . dicyclomine (BENTYL) 10 MG capsule Take 1 capsule (10 mg total) by mouth 4 (four) times daily -  before meals and at bedtime. 90 capsule 1  . dorzolamide-timolol (COSOPT) 22.3-6.8 MG/ML ophthalmic solution Place 1 drop into both eyes 2 (two) times daily. 10 mL 1  . glucose blood (ACCU-CHEK GUIDE) test strip USE AS DIRECTED TO TEST BLOOD GLUCOSE UP TO FOUR TIMES DAILY DX: E11.9 100 each 3  . lisinopril (ZESTRIL) 40 MG tablet Take 1 tablet (40 mg total) by mouth daily. 90 tablet 3  . MULTIPLE VITAMIN PO Take 1 tablet by  mouth daily.    [name] omeprazole (PRILOSEC) 20 MG capsule Take 20 mg by mouth daily as needed (heartburn).    . solifenacin (VESICARE) 5 MG tablet Take 1 tablet (5 mg total) by mouth daily. 90 tablet 1   No current facility-administered medications on file prior to visit.   Review of Systems All otherwise neg per pt     Objective:   Physical Exam BP (!) 172/100 (BP Location: Left Arm, Patient Position: Sitting, Cuff Size: Large)   Pulse 62   Temp 99.1 F (37.3 C) (Oral)   Ht '5\' 5"'$  (1.651 m)   Wt 219 lb (99.3 kg)   SpO2 97%   BMI 36.44 kg/m  VS noted,  Constitutional: Pt appears in NAD HENT: Head: NCAT.  Right Ear: External ear normal.  Left Ear: External ear normal.  Eyes: . Pupils are equal, round, and reactive to light. Conjunctivae and EOM are normal Nose: without d/c or deformity Neck: Neck supple. Gross normal ROM Cardiovascular: Normal rate and regular rhythm.   Pulmonary/Chest: Effort normal and breath sounds without rales or wheezing.  Abd:  Soft, NT, ND, + BS, no organomegaly Neurological: Pt is alert. At baseline orientation, motor grossly intact Skin: Skin is warm. No rashes, other new lesions, no LE edema Psychiatric: Pt behavior is normal without agitation , 2+ nervous All otherwise neg per pt  Lab Results  Component Value Date   WBC 5.6 01/09/2019   HGB 12.1 01/09/2019   HCT 37.0 01/09/2019   PLT 311.0 01/09/2019   GLUCOSE 86 10/14/2019   CHOL 242 (H) 01/09/2019   TRIG 151.0 (H) 01/09/2019   HDL 61.80 01/09/2019   LDLDIRECT 183.0 09/18/2017   LDLCALC 150 (H) 01/09/2019   ALT 19 01/09/2019   AST 18 01/09/2019   NA 143 10/14/2019   K 3.5 10/14/2019   CL 108 10/14/2019   CREATININE 0.82 10/14/2019   BUN 20 10/14/2019   CO2 28 10/14/2019   TSH 1.34 01/09/2019   INR 1.01 01/06/2012   HGBA1C 5.8 (A) 10/14/2019   MICROALBUR 1.3 01/09/2019   POCT glycosylated hemoglobin (Hb A1C) Order: [id number] Status:  Final result Visible to patient:  No  (inaccessible in MyChart) Dx:  Controlled type 2 diabetes mellitus w...  Ref Range & Units 14:15  (01/16/20) 3 mo ago  (10/14/19) 1 yr ago  (01/09/19) 1 yr ago  (04/17/18) 2 yr ago  (09/18/17) 2 yr ago  (03/08/17) 4 yr ago  (02/09/15)  Hemoglobin A1C 4.0 - 5.6 % 6.1Abnormal   5.8Abnormal   6.6High  R, CM  5.8Abnormal   6.2 R, CM  6.4 R, CM  6.8High  Assessment & Plan:

## 2020-01-16 NOTE — Patient Instructions (Addendum)
Ok to increase the toprol XL (metoprolol) to 75 mg per day (that's 1 and 1/2 pills per day)  OK to take the glipizide ER 2.5 mg for days only if your sugar is more than 160  Please continue all other medications as before, and refills have been done if requested.  Please have the pharmacy call with any other refills you may need.  Please continue your efforts at being more active, low cholesterol diet, and weight control.  Please keep your appointments with your specialists as you may have planned  Please make an Appointment to return in 3 months, or sooner if needed

## 2020-01-17 ENCOUNTER — Encounter: Payer: Self-pay | Admitting: Internal Medicine

## 2020-01-17 DIAGNOSIS — R509 Fever, unspecified: Secondary | ICD-10-CM | POA: Insufficient documentation

## 2020-01-17 NOTE — Assessment & Plan Note (Signed)
Chronic with near panic at times, declines change in tx for now

## 2020-01-17 NOTE — Assessment & Plan Note (Signed)
Improved,  to f/u any worsening symptoms or concerns 

## 2020-01-17 NOTE — Assessment & Plan Note (Signed)
Uncontrolled, for increaed toprol xl 75 qd

## 2020-01-17 NOTE — Assessment & Plan Note (Addendum)
Normal a1c, d/w pt, to continue cbgs but only take glipizide sl 2.5 for cbg > 160  I spent 41 minutes in preparing to see the patient by review of recent labs, imaging and procedures, obtaining and reviewing separately obtained history, communicating with the patient and family or caregiver, ordering medications, tests or procedures, and documenting clinical information in the EHR including the differential Dx, treatment, and any further evaluation and other management of dm, htn, fever, oab, anxiety

## 2020-01-17 NOTE — Assessment & Plan Note (Signed)
Asympt, exam benign, declines xray or other lab today

## 2020-01-19 DIAGNOSIS — M5136 Other intervertebral disc degeneration, lumbar region: Secondary | ICD-10-CM | POA: Diagnosis not present

## 2020-01-19 DIAGNOSIS — M5134 Other intervertebral disc degeneration, thoracic region: Secondary | ICD-10-CM | POA: Diagnosis not present

## 2020-01-19 DIAGNOSIS — M546 Pain in thoracic spine: Secondary | ICD-10-CM | POA: Diagnosis not present

## 2020-01-19 DIAGNOSIS — M5416 Radiculopathy, lumbar region: Secondary | ICD-10-CM | POA: Diagnosis not present

## 2020-01-19 DIAGNOSIS — M545 Low back pain: Secondary | ICD-10-CM | POA: Diagnosis not present

## 2020-01-20 DIAGNOSIS — N281 Cyst of kidney, acquired: Secondary | ICD-10-CM | POA: Diagnosis not present

## 2020-01-20 DIAGNOSIS — R3915 Urgency of urination: Secondary | ICD-10-CM | POA: Diagnosis not present

## 2020-02-09 DIAGNOSIS — E119 Type 2 diabetes mellitus without complications: Secondary | ICD-10-CM | POA: Diagnosis not present

## 2020-02-09 DIAGNOSIS — H53411 Scotoma involving central area, right eye: Secondary | ICD-10-CM | POA: Diagnosis not present

## 2020-02-09 DIAGNOSIS — H401133 Primary open-angle glaucoma, bilateral, severe stage: Secondary | ICD-10-CM | POA: Diagnosis not present

## 2020-02-09 DIAGNOSIS — H53432 Sector or arcuate defects, left eye: Secondary | ICD-10-CM | POA: Diagnosis not present

## 2020-02-23 ENCOUNTER — Other Ambulatory Visit: Payer: Self-pay | Admitting: Oral Surgery

## 2020-02-23 DIAGNOSIS — D492 Neoplasm of unspecified behavior of bone, soft tissue, and skin: Secondary | ICD-10-CM

## 2020-02-24 DIAGNOSIS — M5124 Other intervertebral disc displacement, thoracic region: Secondary | ICD-10-CM | POA: Diagnosis not present

## 2020-02-24 DIAGNOSIS — M47814 Spondylosis without myelopathy or radiculopathy, thoracic region: Secondary | ICD-10-CM | POA: Diagnosis not present

## 2020-02-24 DIAGNOSIS — M5134 Other intervertebral disc degeneration, thoracic region: Secondary | ICD-10-CM | POA: Diagnosis not present

## 2020-02-24 DIAGNOSIS — M4724 Other spondylosis with radiculopathy, thoracic region: Secondary | ICD-10-CM | POA: Diagnosis not present

## 2020-02-24 DIAGNOSIS — M47812 Spondylosis without myelopathy or radiculopathy, cervical region: Secondary | ICD-10-CM | POA: Diagnosis not present

## 2020-02-24 DIAGNOSIS — M4802 Spinal stenosis, cervical region: Secondary | ICD-10-CM | POA: Diagnosis not present

## 2020-02-24 DIAGNOSIS — M47816 Spondylosis without myelopathy or radiculopathy, lumbar region: Secondary | ICD-10-CM | POA: Diagnosis not present

## 2020-02-24 DIAGNOSIS — M5414 Radiculopathy, thoracic region: Secondary | ICD-10-CM | POA: Diagnosis not present

## 2020-02-24 DIAGNOSIS — M5136 Other intervertebral disc degeneration, lumbar region: Secondary | ICD-10-CM | POA: Diagnosis not present

## 2020-03-02 DIAGNOSIS — Z96641 Presence of right artificial hip joint: Secondary | ICD-10-CM | POA: Diagnosis not present

## 2020-03-02 DIAGNOSIS — M5416 Radiculopathy, lumbar region: Secondary | ICD-10-CM | POA: Diagnosis not present

## 2020-03-02 DIAGNOSIS — Z96643 Presence of artificial hip joint, bilateral: Secondary | ICD-10-CM | POA: Diagnosis not present

## 2020-03-02 DIAGNOSIS — Z96642 Presence of left artificial hip joint: Secondary | ICD-10-CM | POA: Diagnosis not present

## 2020-03-03 DIAGNOSIS — L680 Hirsutism: Secondary | ICD-10-CM | POA: Diagnosis not present

## 2020-03-03 DIAGNOSIS — L739 Follicular disorder, unspecified: Secondary | ICD-10-CM | POA: Diagnosis not present

## 2020-03-03 DIAGNOSIS — Z79899 Other long term (current) drug therapy: Secondary | ICD-10-CM | POA: Diagnosis not present

## 2020-03-10 DIAGNOSIS — H401133 Primary open-angle glaucoma, bilateral, severe stage: Secondary | ICD-10-CM | POA: Diagnosis not present

## 2020-03-10 DIAGNOSIS — H53432 Sector or arcuate defects, left eye: Secondary | ICD-10-CM | POA: Diagnosis not present

## 2020-03-11 ENCOUNTER — Other Ambulatory Visit: Payer: Medicare Other

## 2020-03-19 ENCOUNTER — Other Ambulatory Visit: Payer: Medicare Other

## 2020-03-24 ENCOUNTER — Other Ambulatory Visit: Payer: Self-pay | Admitting: Internal Medicine

## 2020-03-24 NOTE — Telephone Encounter (Signed)
Please refill as per office routine med refill policy (all routine meds refilled for 3 mo or monthly per pt preference up to one year from last visit, then month to month grace period for 3 mo, then further med refills will have to be denied)  

## 2020-04-07 ENCOUNTER — Other Ambulatory Visit: Payer: Medicare Other

## 2020-04-07 DIAGNOSIS — M47816 Spondylosis without myelopathy or radiculopathy, lumbar region: Secondary | ICD-10-CM | POA: Diagnosis not present

## 2020-04-16 ENCOUNTER — Other Ambulatory Visit: Payer: Medicare Other

## 2020-04-17 ENCOUNTER — Other Ambulatory Visit: Payer: Self-pay | Admitting: Internal Medicine

## 2020-04-17 NOTE — Telephone Encounter (Signed)
Please refill as per office routine med refill policy (all routine meds refilled for 3 mo or monthly per pt preference up to one year from last visit, then month to month grace period for 3 mo, then further med refills will have to be denied)  

## 2020-04-21 DIAGNOSIS — M47816 Spondylosis without myelopathy or radiculopathy, lumbar region: Secondary | ICD-10-CM | POA: Diagnosis not present

## 2020-04-23 IMAGING — DX DG SHOULDER 2+V*R*
4 series · 4 of 4 positions shown · non-contrast
Comparison: None.

CLINICAL DATA: Right shoulder pain post fall.

EXAM:
RIGHT SHOULDER - 2+ VIEW

[shoulder ap]
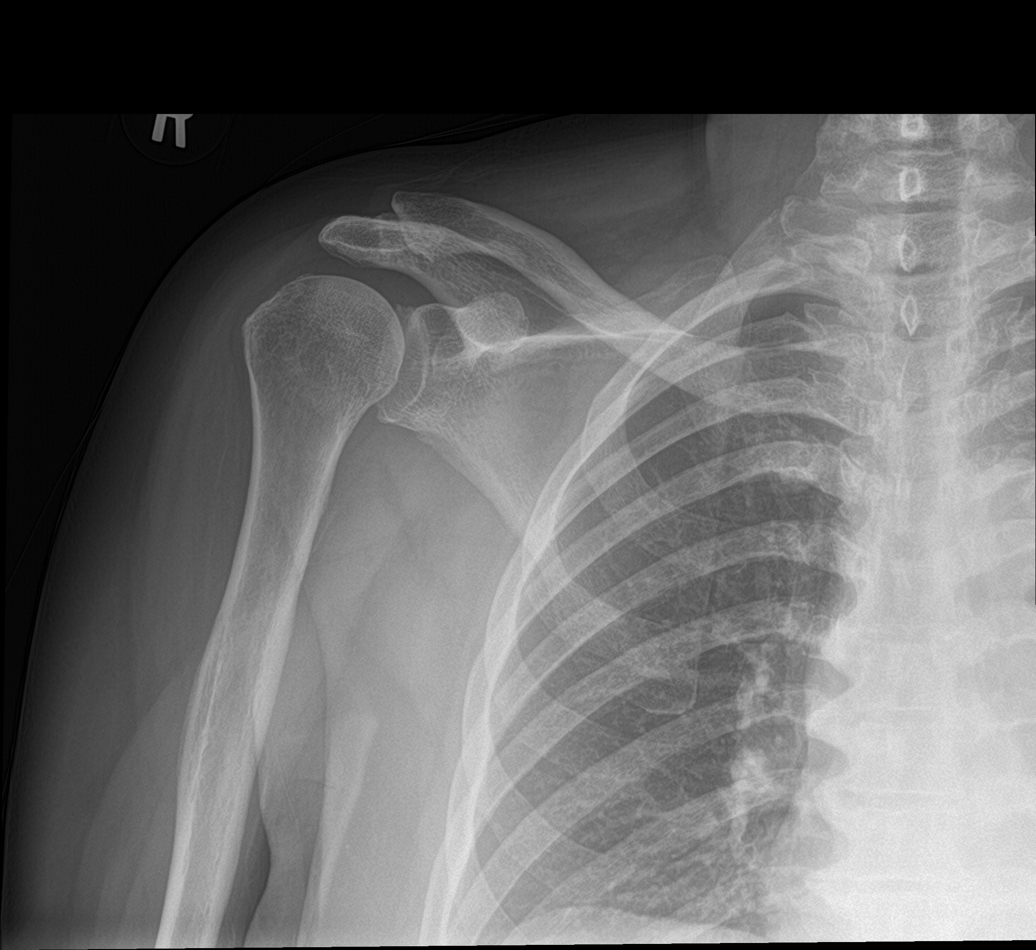

[shoulder grashey]
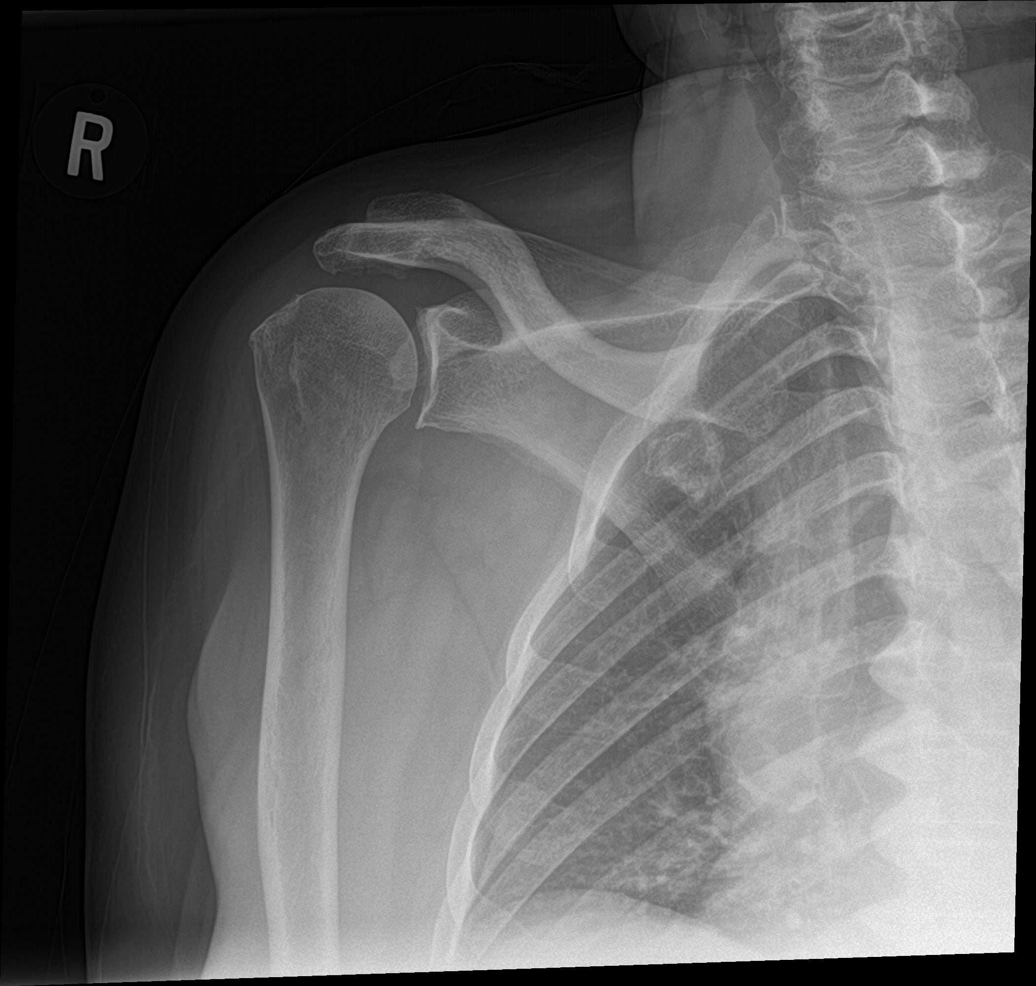

[shoulder y-view]
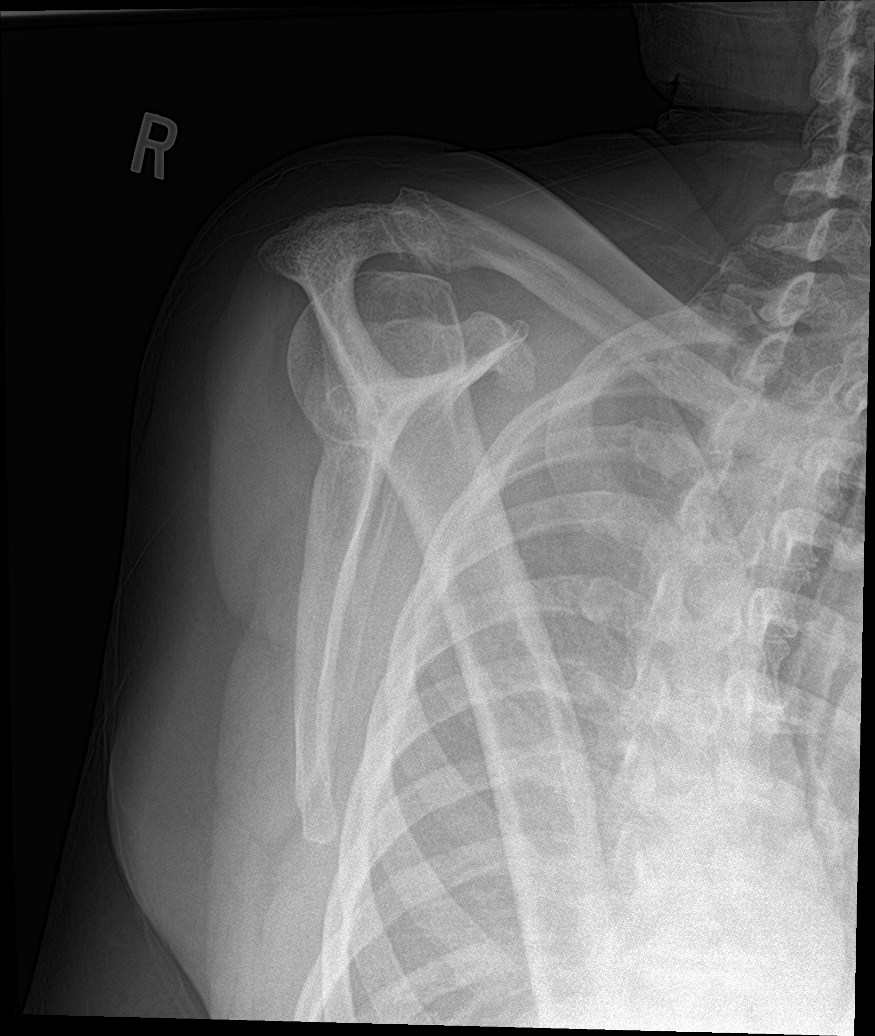

[shoulder axial]
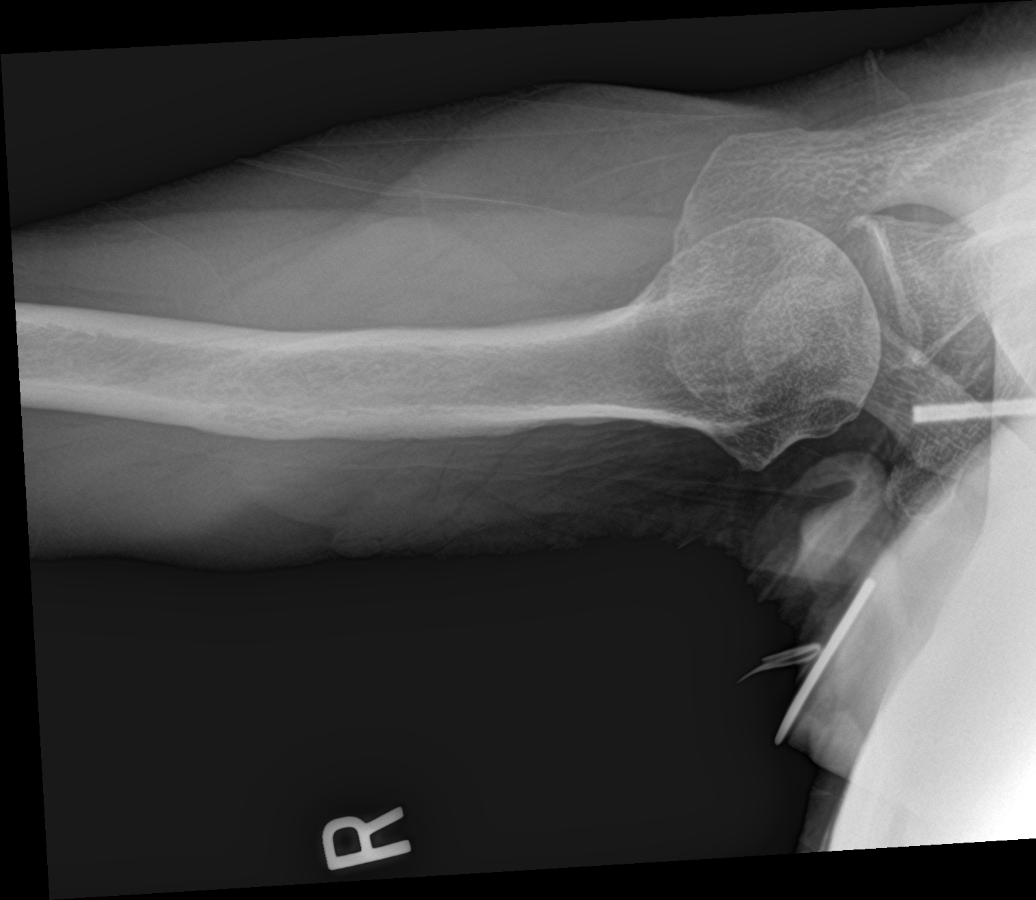

[4 of 4 positions shown; findings below may reference images not displayed]

FINDINGS: There is no evidence of fracture or dislocation. Mild osteoarthritic
changes of the glenohumeral joint. Soft tissues are unremarkable.
IMPRESSION: 1. No acute fracture or dislocation identified about the right
shoulder.
2. Mild osteoarthritic changes of the glenohumeral joint.

## 2020-04-30 ENCOUNTER — Telehealth: Payer: Self-pay | Admitting: Internal Medicine

## 2020-04-30 NOTE — Telephone Encounter (Signed)
   Patient calling, she wants to start taking HCTZ again and only wants to take 1/2 dose of metoprolol succinate (TOPROL-XL) 50 MG 24 hr tablet. Patient has been taking an extra dose of Lisinopril.  Last BP reading 150/90 233/1?? 180/100   Declined to speak with [facility]

## 2020-04-30 NOTE — Telephone Encounter (Signed)
This is too many changes at one time  Pt needs ROV

## 2020-04-30 NOTE — Telephone Encounter (Signed)
Sent to Dr. John to advise. 

## 2020-05-03 DIAGNOSIS — M47816 Spondylosis without myelopathy or radiculopathy, lumbar region: Secondary | ICD-10-CM | POA: Diagnosis not present

## 2020-05-03 NOTE — Telephone Encounter (Signed)
   Scheduler left message [facility] to call for appointment

## 2020-05-06 ENCOUNTER — Telehealth: Payer: Self-pay | Admitting: Internal Medicine

## 2020-05-06 NOTE — Telephone Encounter (Signed)
Ok with me 

## 2020-05-06 NOTE — Telephone Encounter (Signed)
Patient is calling and requesting a TOC from Dr. [name] to Dr. Ethelene Hal, please advise. CB is [phone]

## 2020-05-07 ENCOUNTER — Ambulatory Visit
Admission: EM | Admit: 2020-05-07 | Discharge: 2020-05-07 | Disposition: A | Discharge status: Home / Self Care | Payer: Medicare Other

## 2020-05-07 ENCOUNTER — Encounter: Payer: Self-pay | Admitting: Physician Assistant

## 2020-05-07 ENCOUNTER — Other Ambulatory Visit: Payer: Self-pay

## 2020-05-07 DIAGNOSIS — M542 Cervicalgia: Secondary | ICD-10-CM

## 2020-05-07 DIAGNOSIS — Z760 Encounter for issue of repeat prescription: Secondary | ICD-10-CM

## 2020-05-07 MED ORDER — TIZANIDINE HCL 2 MG PO TABS
2.0000 mg | ORAL_TABLET | Freq: Three times a day (TID) | ORAL | 0 refills | Status: DC | PRN
Start: 2020-05-07 — End: 2021-05-11

## 2020-05-07 MED ORDER — HYDROCHLOROTHIAZIDE 25 MG PO TABS: 25.0000 mg | ORAL_TABLET | Freq: Every day | ORAL | 0 refills | Status: DC

## 2020-05-07 MED ORDER — DICLOFENAC SODIUM 1 % EX GEL: 2.0000 g | Freq: Four times a day (QID) | CUTANEOUS | 0 refills | Status: DC

## 2020-05-07 NOTE — ED Provider Notes (Signed)
[name]    CSN: [id number] Arrival date & time: 05/07/20  1447      History   Chief Complaint Chief Complaint  Patient presents with  . Neck Pain    HPI Melissa [name] is a 69 y.o. female.   69 year old female with history of DDD, arthritis, DM, HTN comes in for 2 week history of right neck pain/stiffness. Denies injury/trauma. Mild pain at rest, pain worse with ROM. Denies radiation of pain, numbness/tingling. Denies loss of grip strength. Tylenol/ibuprofen with minimal relief. States first thing in the morning may be slightly more stiff.  Also requesting medication refill. States ran out of HCTZ 2 weeks ago. Currently between PCP due to insurance change.      Past Medical History:  Diagnosis Date  . Allergy   . Anxiety   . Arthritis    s/p R TKR  . Cyst of left kidney 2015   by lumbar MRI pending renal US  . DDD (degenerative disc disease), lumbar 03/2014   severe L2-3 with L HNP with L2/3 nerve root impingement [name])  . Depression   . Depression with anxiety   . Diabetes type 2, controlled ([place]) 2011   borderline  . Dyspnea   . Glaucoma   . History of ulcer disease   . HLD (hyperlipidemia)    no meds taken  . Hypertension   . PONV (postoperative nausea and vomiting)   . Ulcer     Patient Active Problem List   Diagnosis Date Noted  . Low grade fever 01/17/2020  . Frequent urination 10/14/2019  . OAB (overactive bladder) 10/14/2019  . Encounter for well adult exam with abnormal findings 01/07/2019  . Right-sided face pain 01/07/2019  . Temple tenderness 01/07/2019  . Trigger middle finger of right hand 11/12/2018  . Spondylosis of cervical region without myelopathy or radiculopathy 01/14/2018  . Chronic left shoulder pain 01/08/2018  . Snoring 09/14/2017  . History of total right hip replacement 07/19/2017  . Rotator cuff tear 02/02/2017  . Acquired renal cyst of left kidney 01/01/2016  . Osteoarthritis of spine with  radiculopathy, 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  . DDD (degenerative disc disease), lumbar 03/04/2014  . Lumbar facet joint pain 02/24/2014  . Plantar fasciitis of left foot 10/01/2013  . IBS (irritable bowel syndrome) 05/13/2013  . Hammer toe, acquired 02/11/2013  . Metatarsalgia of both feet 02/11/2013  . Pain of left breast 11/13/2012  . Obesity 11/13/2012  . Glaucoma   . Type 2 diabetes mellitus, controlled ([place])   . Anxiety and depression   . Vitreous degeneration, unspecified eye 10/25/2012  . HTN (hypertension) 01/06/2012  . GERD (gastroesophageal reflux disease) 01/06/2012  . Hypercholesteremia 01/06/2012    Past Surgical History:  Procedure Laterality Date  . ABDOMINAL HYSTERECTOMY  2007   fibroids, heavy bleeding  . ARTERY BIOPSY Right 01/23/2019   Procedure: REMOVAL OF PART OF RIGHT TEMPORAL ARTERY FOR BIOPSY;  Surgeon: Michael Boston, MD;  Location: [facility];  Service: General;  Laterality: Right;  . CATARACT EXTRACTION W/ INTRAOCULAR LENS IMPLANT Bilateral   . COLONOSCOPY  04/2017   TA, diverticulosis, rpt ? ([facility])  . GLAUCOMA SURGERY    . HAMMER TOE SURGERY Bilateral   . Right hip replacement    . TONSILLECTOMY    . TOTAL KNEE ARTHROPLASTY Right 2004   TKR (Dr. Eddie Dibbles with [facility])  . TOTAL KNEE REVISION Right 03/2014   Dr Al Corpus [facility]    OB History  Gravida  0   Para      Term      Preterm      AB      Living  0     SAB      TAB      Ectopic      Multiple      Live Births               Home Medications    Prior to Admission medications   Medication Sig Start Date End Date Taking? Authorizing Provider  acetaminophen (TYLENOL) 500 MG tablet Take 500 mg by mouth every 6 (six) hours as needed for moderate pain or headache.   Yes [provider]  aspirin EC 81 MG tablet Take 81 mg by mouth every evening.    Yes [provider]  citalopram (CELEXA) 20 MG tablet Take 1 tablet (20 mg total) by mouth daily. 08/27/19  Yes Biagio Borg, MD  glipiZIDE (GLUCOTROL XL) 2.5 MG 24 hr tablet 1 tab by mouth daily only for sugar over 160 01/16/20  Yes Biagio Borg, MD  lisinopril (ZESTRIL) 40 MG tablet TAKE 1 TABLET(40 MG) BY MOUTH DAILY 03/26/20  Yes Biagio Borg, MD  metoprolol succinate (TOPROL-XL) 50 MG 24 hr tablet Take 1.5 tablets (75 mg total) by mouth daily. Take with or immediately following a meal. 01/16/20  Yes Biagio Borg, MD  Accu-Chek FastClix Lancets MISC USE TO TEST BLOOD SUGAR UP TO FOUR TIMES DAILY 11/25/18   Lance Sell, NP  ALPRAZolam Duanne Moron) 0.25 MG tablet TAKE 1 TABLET(0.25 MG) BY MOUTH AT BEDTIME AS NEEDED FOR SLEEP 12/04/19   Biagio Borg, MD  blood glucose meter kit and supplies KIT Dispense based on patient and insurance preference. Use up to four times daily as directed. DX Code: E11.9 02/11/18   Lance Sell, NP  diclofenac Sodium (VOLTAREN) 1 % GEL Apply 2 g topically 4 (four) times daily. 05/07/20   Tasia Catchings, Andalyn Heckstall V, PA-C  dicyclomine (BENTYL) 10 MG capsule Take 1 capsule (10 mg total) by mouth 4 (four) times daily -  before meals and at bedtime. 08/27/19   Biagio Borg, MD  dorzolamide-timolol (COSOPT) 22.3-6.8 MG/ML ophthalmic solution Place 1 drop into both eyes 2 (two) times daily. 01/07/19   Biagio Borg, MD  glucose blood (ACCU-CHEK GUIDE) test strip USE AS DIRECTED TO TEST BLOOD GLUCOSE UP TO FOUR TIMES DAILY DX: E11.9 01/22/19   Biagio Borg, MD  hydrochlorothiazide (HYDRODIURIL) 25 MG tablet Take 1 tablet (25 mg total) by mouth daily. 05/07/20   Tasia Catchings, Caramia Boutin V, PA-C  MULTIPLE VITAMIN PO Take 1 tablet by mouth daily.    [provider]  omeprazole (PRILOSEC) 20 MG capsule Take 20 mg by mouth daily as needed (heartburn).    [provider]  solifenacin (VESICARE) 5 MG tablet Take 1 tablet (5 mg total) by mouth daily. 10/14/19   Janith Lima, MD  tiZANidine (ZANAFLEX) 2 MG tablet Take 1 tablet (2 mg total) by mouth every 8 (eight) hours as needed for muscle spasms. 05/07/20   Ok Edwards, PA-C    Family History Family History  Problem Relation Age of Onset  . Cancer Maternal Grandmother 33       ovarian  . Diabetes Maternal Grandmother   . Hypertension Maternal Grandmother   . Cancer Mother 25       bone, MM  . Stroke Other  unsure who  . CAD Neg Hx   . Colon cancer Neg Hx   . Esophageal cancer Neg Hx   . Rectal cancer Neg Hx   . Stomach cancer Neg Hx     Social History Social History   Tobacco Use  . Smoking status: Never Smoker  . Smokeless tobacco: Never Used  Vaping Use  . Vaping Use: Never used  Substance Use Topics  . Alcohol use: No  . Drug use: No     Allergies   Codeine, Metformin and related, and Oxycodone   Review of Systems Review of Systems  Reason unable to perform ROS: See HPI as above.     Physical Exam Triage Vital Signs ED Triage Vitals  Enc Vitals Group     BP 05/07/20 1651 (!) 146/116     Pulse Rate 05/07/20 1651 70     Resp 05/07/20 1651 20     Temp 05/07/20 1651 98.3 F (36.8 C)     Temp Source 05/07/20 1651 Oral     SpO2 05/07/20 1651 96 %     Weight --      Height --      Head Circumference --      Peak Flow --      Pain Score 05/07/20 1708 8     Pain Loc --      Pain Edu? --      Excl. in [place]? --    No data found.  Updated Vital Signs BP (!) 146/116 (BP Location: Left Arm)   Pulse 70   Temp 98.3 F (36.8 C) (Oral)   Resp 20   SpO2 96%   Visual Acuity Right Eye Distance:   Left Eye Distance:   Bilateral Distance:    Right Eye Near:   Left Eye Near:    Bilateral Near:     Physical Exam Constitutional:      General: She is not in acute distress.    Appearance: Normal appearance. She is well-developed. She is not toxic-appearing or diaphoretic.  HENT:     Head: Normocephalic and atraumatic.  Eyes:     Conjunctiva/sclera: Conjunctivae normal.     Pupils: Pupils are equal, round, and reactive to light.  Neck:     Comments: No tenderness to spinous processes. Tenderness to  palpation of right neck. Full ROM.  Cardiovascular:     Rate and Rhythm: Normal rate and regular rhythm.  Pulmonary:     Effort: Pulmonary effort is normal. No respiratory distress.     Comments: LCTAB Musculoskeletal:     Cervical back: Normal range of motion and neck supple.     Comments: Full ROM of BLE. Strength 5/5. Sensation intact. Radial pulse 2+  Skin:    General: Skin is warm and dry.  Neurological:     Mental Status: She is alert and oriented to person, place, and time.      UC Treatments / Results  Labs (all labs ordered are listed, but only abnormal results are displayed) Labs Reviewed - No data to display  EKG   Radiology No results found.  Procedures Procedures (including critical care time)  Medications Ordered in UC Medications - No data to display  Initial Impression / Assessment and Plan / UC Course  I have reviewed the triage vital signs and the nursing notes.  Pertinent labs & imaging results that were available during my care of the patient were reviewed by me and considered in my medical decision making (see  chart for details).    1. Right neck pain NSAID as directed. Muscle relaxant as needed. Discussed to avoid xanax use when first trial muscle relaxant. Return precautions given.   2. Medication refill HCTZ refilled for 30 days. To follow up with PCP for further refills.  Final Clinical Impressions(s) / UC Diagnoses   Final diagnoses:  Neck pain on right side  Medication refill    ED Prescriptions    Medication Sig Dispense Auth. Provider   hydrochlorothiazide (HYDRODIURIL) 25 MG tablet Take 1 tablet (25 mg total) by mouth daily. 30 tablet Geniva Lohnes V, PA-C   tiZANidine (ZANAFLEX) 2 MG tablet Take 1 tablet (2 mg total) by mouth every 8 (eight) hours as needed for muscle spasms. 15 tablet Jadee Golebiewski V, PA-C   diclofenac Sodium (VOLTAREN) 1 % GEL Apply 2 g topically 4 (four) times daily. 100 g Ok Edwards, PA-C     I have reviewed the PDMP  during this encounter.   Ok Edwards, PA-C 05/07/20 1736

## 2020-05-07 NOTE — Telephone Encounter (Signed)
It was not my idea to start, so ok with me, but must be ok with patient

## 2020-05-07 NOTE — Discharge Instructions (Signed)
Right neck pain. Voltaren gel as directed. You can take ibuprofen if needed. Tizanidine as needed, this can make you drowsy, so do not take if you are going to drive, operate heavy machinery, or make important decisions. Do not use xanax when first trying tizanidine as it can also make you drowsy. Warm compresses. Follow up with PCP/orthopedics if symptoms worsen, changes for reevaluation. If experience loss of grip strength, numbness of arm, go to the emergency department for further evaluation.   Medication refill HCTZ refilled for 30 days. Follow up with PCP for further refills needed.

## 2020-05-07 NOTE — Telephone Encounter (Signed)
I don't think that it would be a good fit.

## 2020-05-07 NOTE — ED Triage Notes (Signed)
Pt c/o right neck pain/stiffness and limited ROM for approx 2 weeks. Pt states stiffness started upon waking and that she has h/o arthritis in neck and right shoulder. Denies dizziness, hand parasthesia, blurred vision, SOB. Pt requests refill hctz-has been out of Rx for approx 2 weeks while her PCP is being changed 2/2 insurance. Reports she had some ankle swelling earlier in the week and used OTC diuretic.

## 2020-05-11 NOTE — Telephone Encounter (Signed)
Pt called requesting appt with Dr. Ethelene Hal - pt was advised he is unable to accept at this time.

## 2020-05-12 NOTE — Telephone Encounter (Signed)
Sent to Dr. John to advise. 

## 2020-05-12 NOTE — Telephone Encounter (Signed)
I have nothing further to offer

## 2020-05-16 ENCOUNTER — Encounter: Payer: Self-pay | Admitting: Emergency Medicine

## 2020-05-16 ENCOUNTER — Other Ambulatory Visit: Payer: Self-pay

## 2020-05-16 ENCOUNTER — Ambulatory Visit
Admission: EM | Admit: 2020-05-16 | Discharge: 2020-05-16 | Disposition: A | Discharge status: Home / Self Care | Payer: Medicare Other | Attending: Emergency Medicine | Admitting: Emergency Medicine

## 2020-05-16 DIAGNOSIS — J069 Acute upper respiratory infection, unspecified: Secondary | ICD-10-CM

## 2020-05-16 DIAGNOSIS — Z1152 Encounter for screening for COVID-19: Secondary | ICD-10-CM | POA: Diagnosis not present

## 2020-05-16 MED ORDER — CETIRIZINE HCL 10 MG PO TABS: 10.0000 mg | ORAL_TABLET | Freq: Every day | ORAL | 0 refills | Status: DC

## 2020-05-16 MED ORDER — BENZONATATE 100 MG PO CAPS: 100.0000 mg | ORAL_CAPSULE | Freq: Three times a day (TID) | ORAL | 0 refills | Status: DC

## 2020-05-16 MED ORDER — FLUTICASONE PROPIONATE 50 MCG/ACT NA SUSP: 1.0000 | Freq: Every day | NASAL | 0 refills | Status: DC

## 2020-05-16 MED ORDER — ALBUTEROL SULFATE HFA 108 (90 BASE) MCG/ACT IN AERS: 2.0000 | INHALATION_SPRAY | RESPIRATORY_TRACT | 0 refills | Status: DC | PRN

## 2020-05-16 MED ORDER — AEROCHAMBER PLUS FLO-VU MEDIUM MISC: 1.0000 | Freq: Once | 0 refills | Status: AC

## 2020-05-16 NOTE — Discharge Instructions (Addendum)

## 2020-05-16 NOTE — ED Triage Notes (Signed)
Pt here with cough and URI sx x 3 days

## 2020-05-16 NOTE — ED Provider Notes (Signed)
[name]    CSN: [id number] Arrival date & time: 05/16/20  5465      History   Chief Complaint Chief Complaint  Patient presents with  . Cough  . URI    HPI Melissa [name] is a 69 y.o. female  Presenting for cough, nasal congestion, malaise and fatigue x3 days.  Patient denies chest pain, shortness of breath fever, nausea, vomiting, arthralgias, myalgias.  No change in urinary or bowel habit.  Has taken Mucinex with some relief.  Requesting Covid testing.  Is fully vaccinated.  Past Medical History:  Diagnosis Date  . Allergy   . Anxiety   . Arthritis    s/p R TKR  . Cyst of left kidney 2015   by lumbar MRI pending renal US  . DDD (degenerative disc disease), lumbar 03/2014   severe L2-3 with L HNP with L2/3 nerve root impingement [name])  . Depression   . Depression with anxiety   . Diabetes type 2, controlled ([place]) 2011   borderline  . Dyspnea   . Glaucoma   . History of ulcer disease   . HLD (hyperlipidemia)    no meds taken  . Hypertension   . PONV (postoperative nausea and vomiting)   . Ulcer     Patient Active Problem List   Diagnosis Date Noted  . Low grade fever 01/17/2020  . Frequent urination 10/14/2019  . OAB (overactive bladder) 10/14/2019  . Encounter for well adult exam with abnormal findings 01/07/2019  . Right-sided face pain 01/07/2019  . Temple tenderness 01/07/2019  . Trigger middle finger of right hand 11/12/2018  . Spondylosis of cervical region without myelopathy or radiculopathy 01/14/2018  . Chronic left shoulder pain 01/08/2018  . Snoring 09/14/2017  . History of total right hip replacement 07/19/2017  . Rotator cuff tear 02/02/2017  . Acquired renal cyst of left kidney 01/01/2016  . Osteoarthritis of spine with radiculopathy, 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  . DDD (degenerative disc disease), lumbar 03/04/2014  . Lumbar facet joint pain 02/24/2014  . Plantar fasciitis of left foot 10/01/2013  . IBS (irritable  bowel syndrome) 05/13/2013  . Hammer toe, acquired 02/11/2013  . Metatarsalgia of both feet 02/11/2013  . Pain of left breast 11/13/2012  . Obesity 11/13/2012  . Glaucoma   . Type 2 diabetes mellitus, controlled ([place])   . Anxiety and depression   . Vitreous degeneration, unspecified eye 10/25/2012  . HTN (hypertension) 01/06/2012  . GERD (gastroesophageal reflux disease) 01/06/2012  . Hypercholesteremia 01/06/2012    Past Surgical History:  Procedure Laterality Date  . ABDOMINAL HYSTERECTOMY  2007   fibroids, heavy bleeding  . ARTERY BIOPSY Right 01/23/2019   Procedure: REMOVAL OF PART OF RIGHT TEMPORAL ARTERY FOR BIOPSY;  Surgeon: Michael Boston, MD;  Location: [facility];  Service: General;  Laterality: Right;  . CATARACT EXTRACTION W/ INTRAOCULAR LENS IMPLANT Bilateral   . COLONOSCOPY  04/2017   TA, diverticulosis, rpt ? ([place])  . GLAUCOMA SURGERY    . HAMMER TOE SURGERY Bilateral   . Right hip replacement    . TONSILLECTOMY    . TOTAL KNEE ARTHROPLASTY Right 2004   TKR (Dr. Eddie Dibbles with [facility])  . TOTAL KNEE REVISION Right 03/2014   Dr Al Corpus [facility]    OB History    Gravida  0   Para      Term      Preterm      AB      Living  0     SAB      TAB      Ectopic      Multiple      Live Births               Home Medications    Prior to Admission medications   Medication Sig Start Date End Date Taking? Authorizing Provider  Accu-Chek FastClix Lancets MISC USE TO TEST BLOOD SUGAR UP TO FOUR TIMES DAILY 11/25/18   Lance Sell, NP  acetaminophen (TYLENOL) 500 MG tablet Take 500 mg by mouth every 6 (six) hours as needed for moderate pain or headache.    [provider]  albuterol (VENTOLIN HFA) 108 (90 Base) MCG/ACT inhaler Inhale 2 puffs into the lungs every 4 (four) hours as needed for wheezing or shortness of breath. 05/16/20   Hall-Potvin, [place], PA-C  ALPRAZolam (XANAX) 0.25 MG tablet TAKE 1 TABLET(0.25 MG) BY MOUTH AT BEDTIME AS NEEDED  FOR SLEEP 12/04/19   Biagio Borg, MD  aspirin EC 81 MG tablet Take 81 mg by mouth every evening.     [provider]  benzonatate (TESSALON) 100 MG capsule Take 1 capsule (100 mg total) by mouth every 8 (eight) hours. 05/16/20   Hall-Potvin, [place], PA-C  blood glucose meter kit and supplies KIT Dispense based on patient and insurance preference. Use up to four times daily as directed. DX Code: E11.9 02/11/18   Lance Sell, NP  cetirizine (ZYRTEC ALLERGY) 10 MG tablet Take 1 tablet (10 mg total) by mouth daily. 05/16/20   Hall-Potvin, [place], PA-C  citalopram (CELEXA) 20 MG tablet Take 1 tablet (20 mg total) by mouth daily. 08/27/19   Biagio Borg, MD  diclofenac Sodium (VOLTAREN) 1 % GEL Apply 2 g topically 4 (four) times daily. 05/07/20   Tasia Catchings, Amy V, PA-C  dicyclomine (BENTYL) 10 MG capsule Take 1 capsule (10 mg total) by mouth 4 (four) times daily -  before meals and at bedtime. 08/27/19   Biagio Borg, MD  dorzolamide-timolol (COSOPT) 22.3-6.8 MG/ML ophthalmic solution Place 1 drop into both eyes 2 (two) times daily. 01/07/19   Biagio Borg, MD  fluticasone (FLONASE) 50 MCG/ACT nasal spray Place 1 spray into both nostrils daily. 05/16/20   Hall-Potvin, [place], PA-C  glipiZIDE (GLUCOTROL XL) 2.5 MG 24 hr tablet 1 tab by mouth daily only for sugar over 160 01/16/20   Biagio Borg, MD  glucose blood (ACCU-CHEK GUIDE) test strip USE AS DIRECTED TO TEST BLOOD GLUCOSE UP TO FOUR TIMES DAILY DX: E11.9 01/22/19   Biagio Borg, MD  hydrochlorothiazide (HYDRODIURIL) 25 MG tablet Take 1 tablet (25 mg total) by mouth daily. 05/07/20   Tasia Catchings, Amy V, PA-C  lisinopril (ZESTRIL) 40 MG tablet TAKE 1 TABLET(40 MG) BY MOUTH DAILY 03/26/20   Biagio Borg, MD  metoprolol succinate (TOPROL-XL) 50 MG 24 hr tablet Take 1.5 tablets (75 mg total) by mouth daily. Take with or immediately following a meal. 01/16/20   Biagio Borg, MD  MULTIPLE VITAMIN PO Take 1 tablet by mouth daily.    [provider]    omeprazole (PRILOSEC) 20 MG capsule Take 20 mg by mouth daily as needed (heartburn).    [provider]  solifenacin (VESICARE) 5 MG tablet Take 1 tablet (5 mg total) by mouth daily. 10/14/19   Janith Lima, MD  Spacer/Aero-Holding Chambers (AEROCHAMBER PLUS FLO-VU MEDIUM) MISC 1 each by Other route once for 1 dose. 05/16/20  05/16/20  Hall-Potvin, [place], PA-C  tiZANidine (ZANAFLEX) 2 MG tablet Take 1 tablet (2 mg total) by mouth every 8 (eight) hours as needed for muscle spasms. 05/07/20   Ok Edwards, PA-C    Family History Family History  Problem Relation Age of Onset  . Cancer Maternal Grandmother 14       ovarian  . Diabetes Maternal Grandmother   . Hypertension Maternal Grandmother   . Cancer Mother 52       bone, MM  . Stroke Other        unsure who  . CAD Neg Hx   . Colon cancer Neg Hx   . Esophageal cancer Neg Hx   . Rectal cancer Neg Hx   . Stomach cancer Neg Hx     Social History Social History   Tobacco Use  . Smoking status: Never Smoker  . Smokeless tobacco: Never Used  Vaping Use  . Vaping Use: Never used  Substance Use Topics  . Alcohol use: No  . Drug use: No     Allergies   Codeine, Metformin and related, and Oxycodone   Review of Systems As per HPI   Physical Exam Triage Vital Signs ED Triage Vitals [05/16/20 0836]  Enc Vitals Group     BP 136/83     Pulse Rate 97     Resp 18     Temp 98.6 F (37 C)     Temp Source Oral     SpO2 97 %     Weight      Height      Head Circumference      Peak Flow      Pain Score 5     Pain Loc      Pain Edu?      Excl. in [place]?    No data found.  Updated Vital Signs BP 136/83 (BP Location: Left Arm)   Pulse 97   Temp 98.6 F (37 C) (Oral)   Resp 18   SpO2 97%   Visual Acuity Right Eye Distance:   Left Eye Distance:   Bilateral Distance:    Right Eye Near:   Left Eye Near:    Bilateral Near:     Physical Exam Constitutional:      General: She is not in acute distress.     Appearance: She is obese. She is not ill-appearing or diaphoretic.  HENT:     Head: Normocephalic and atraumatic.     Mouth/Throat:     Mouth: Mucous membranes are moist.     Pharynx: Oropharynx is clear. No oropharyngeal exudate or posterior oropharyngeal erythema.  Eyes:     General: No scleral icterus.    Conjunctiva/sclera: Conjunctivae normal.     Pupils: Pupils are equal, round, and reactive to light.  Neck:     Comments: Trachea midline, negative JVD Cardiovascular:     Rate and Rhythm: Normal rate and regular rhythm.     Heart sounds: No murmur heard.  No gallop.   Pulmonary:     Effort: Pulmonary effort is normal. No respiratory distress.     Breath sounds: No wheezing, rhonchi or rales.  Musculoskeletal:     Cervical back: Neck supple. No tenderness.  Lymphadenopathy:     Cervical: No cervical adenopathy.  Skin:    Capillary Refill: Capillary refill takes less than 2 seconds.     Coloration: Skin is not jaundiced or pale.     Findings: No rash.  Neurological:  General: No focal deficit present.     Mental Status: She is alert and oriented to person, place, and time.      UC Treatments / Results  Labs (all labs ordered are listed, but only abnormal results are displayed) Labs Reviewed  NOVEL CORONAVIRUS, NAA    EKG   Radiology No results found.  Procedures Procedures (including critical care time)  Medications Ordered in UC Medications - No data to display  Initial Impression / Assessment and Plan / UC Course  I have reviewed the triage vital signs and the nursing notes.  Pertinent labs & imaging results that were available during my care of the patient were reviewed by me and considered in my medical decision making (see chart for details).     Patient afebrile, nontoxic, with SpO2 97%.  Covid PCR pending.  Patient to quarantine until results are back.  We will treat supportively as outlined below.  Return precautions discussed, patient  verbalized understanding and is agreeable to plan. Final Clinical Impressions(s) / UC Diagnoses   Final diagnoses:  Encounter for screening for COVID-19  URI with cough and congestion     Discharge Instructions     Tessalon for cough. Start flonase, atrovent nasal spray for nasal congestion/drainage. You can use over the counter nasal saline rinse such as neti pot for nasal congestion. Keep hydrated, your urine should be clear to pale yellow in color. Tylenol/motrin for fever and pain. Monitor for any worsening of symptoms, chest pain, shortness of breath, wheezing, swelling of the throat, go to the emergency department for further evaluation needed.     ED Prescriptions    Medication Sig Dispense Auth. Provider   benzonatate (TESSALON) 100 MG capsule Take 1 capsule (100 mg total) by mouth every 8 (eight) hours. 21 capsule Hall-Potvin, [place], PA-C   cetirizine (ZYRTEC ALLERGY) 10 MG tablet Take 1 tablet (10 mg total) by mouth daily. 30 tablet Hall-Potvin, [place], PA-C   fluticasone (FLONASE) 50 MCG/ACT nasal spray Place 1 spray into both nostrils daily. 16 g Hall-Potvin, [place], PA-C   albuterol (VENTOLIN HFA) 108 (90 Base) MCG/ACT inhaler Inhale 2 puffs into the lungs every 4 (four) hours as needed for wheezing or shortness of breath. 18 g Hall-Potvin, [place], PA-C   Spacer/Aero-Holding Chambers (AEROCHAMBER PLUS FLO-VU MEDIUM) MISC 1 each by Other route once for 1 dose. 1 each Hall-Potvin, [place], PA-C     PDMP not reviewed this encounter.   Hall-Potvin, [place], [place] 05/16/20 3235

## 2020-05-17 LAB — SARS-COV-2, NAA 2 DAY TAT

## 2020-05-17 LAB — NOVEL CORONAVIRUS, NAA: SARS-CoV-2, NAA: NOT DETECTED

## 2020-05-21 NOTE — Telephone Encounter (Signed)
Message left for patient

## 2020-06-19 ENCOUNTER — Ambulatory Visit: Payer: Medicare Other | Attending: Internal Medicine

## 2020-06-19 DIAGNOSIS — Z23 Encounter for immunization: Secondary | ICD-10-CM

## 2020-06-19 NOTE — Progress Notes (Signed)
   Covid-19 Vaccination Clinic  Name:  SECRET KRISTENSEN    MRN: [id number] DOB: Aug 23, 1951  06/19/2020  Ms. Rattan was observed post Covid-19 immunization for 15 minutes without incident. She was provided with Vaccine Information Sheet and instruction to access the V-Safe system.   Ms. Guedes was instructed to call 911 with any severe reactions post vaccine: [name] Difficulty breathing  . Swelling of face and throat  . A fast heartbeat  . A bad rash all over body  . Dizziness and weakness

## 2020-06-28 ENCOUNTER — Other Ambulatory Visit: Payer: Self-pay | Admitting: Internal Medicine

## 2020-07-20 ENCOUNTER — Other Ambulatory Visit: Payer: Self-pay | Admitting: Family Medicine

## 2020-07-20 DIAGNOSIS — Z1231 Encounter for screening mammogram for malignant neoplasm of breast: Secondary | ICD-10-CM

## 2020-08-09 ENCOUNTER — Other Ambulatory Visit: Payer: Self-pay | Admitting: Internal Medicine

## 2020-08-09 NOTE — Telephone Encounter (Signed)
Please refill as per office routine med refill policy (all routine meds refilled for 3 mo or monthly per pt preference up to one year from last visit, then month to month grace period for 3 mo, then further med refills will have to be denied)  

## 2020-08-31 ENCOUNTER — Ambulatory Visit: Payer: Medicare Other

## 2020-09-04 DIAGNOSIS — G4730 Sleep apnea, unspecified: Secondary | ICD-10-CM

## 2020-09-04 HISTORY — PX: TOTAL SHOULDER ARTHROPLASTY: SHX126

## 2020-09-04 HISTORY — DX: Sleep apnea, unspecified: G47.30

## 2020-09-08 ENCOUNTER — Ambulatory Visit: Payer: Medicare Other

## 2020-10-13 ENCOUNTER — Inpatient Hospital Stay: Admission: RE | Admit: 2020-10-13 | Payer: Medicare Other | Source: Ambulatory Visit

## 2020-12-06 ENCOUNTER — Ambulatory Visit
Admission: RE | Admit: 2020-12-06 | Discharge: 2020-12-06 | Disposition: A | Discharge status: Home / Self Care | Payer: Medicare Other | Source: Ambulatory Visit | Attending: Family Medicine | Admitting: Family Medicine

## 2020-12-06 ENCOUNTER — Other Ambulatory Visit: Payer: Self-pay

## 2020-12-06 DIAGNOSIS — Z1231 Encounter for screening mammogram for malignant neoplasm of breast: Secondary | ICD-10-CM

## 2020-12-18 ENCOUNTER — Other Ambulatory Visit: Payer: Self-pay

## 2020-12-18 ENCOUNTER — Emergency Department (HOSPITAL_COMMUNITY): Payer: Medicare Other

## 2020-12-18 ENCOUNTER — Encounter (HOSPITAL_COMMUNITY): Payer: Self-pay

## 2020-12-18 ENCOUNTER — Emergency Department (HOSPITAL_COMMUNITY)
Admission: EM | Admit: 2020-12-18 | Discharge: 2020-12-18 | Disposition: A | Discharge status: Home / Self Care | Payer: Medicare Other | Attending: Emergency Medicine | Admitting: Emergency Medicine

## 2020-12-18 DIAGNOSIS — M25512 Pain in left shoulder: Secondary | ICD-10-CM

## 2020-12-18 DIAGNOSIS — Z7984 Long term (current) use of oral hypoglycemic drugs: Secondary | ICD-10-CM | POA: Insufficient documentation

## 2020-12-18 DIAGNOSIS — Z7982 Long term (current) use of aspirin: Secondary | ICD-10-CM | POA: Insufficient documentation

## 2020-12-18 DIAGNOSIS — E119 Type 2 diabetes mellitus without complications: Secondary | ICD-10-CM | POA: Insufficient documentation

## 2020-12-18 DIAGNOSIS — Z96651 Presence of right artificial knee joint: Secondary | ICD-10-CM | POA: Insufficient documentation

## 2020-12-18 DIAGNOSIS — Z79899 Other long term (current) drug therapy: Secondary | ICD-10-CM | POA: Diagnosis not present

## 2020-12-18 DIAGNOSIS — I10 Essential (primary) hypertension: Secondary | ICD-10-CM | POA: Insufficient documentation

## 2020-12-18 MED ORDER — LIDOCAINE 5 % EX PTCH
1.0000 | MEDICATED_PATCH | CUTANEOUS | Status: DC
  Administered 2020-12-18: 1 via TRANSDERMAL
  Filled 2020-12-18: qty 1

## 2020-12-18 MED ORDER — LIDOCAINE 5 % EX PTCH: 1.0000 | MEDICATED_PATCH | CUTANEOUS | 0 refills | Status: DC

## 2020-12-18 MED ORDER — TRAMADOL HCL 50 MG PO TABS: 50.0000 mg | ORAL_TABLET | Freq: Four times a day (QID) | ORAL | 0 refills | Status: DC | PRN

## 2020-12-18 MED ORDER — DICLOFENAC SODIUM 1 % EX GEL: 2.0000 g | Freq: Four times a day (QID) | CUTANEOUS | 0 refills | Status: DC

## 2020-12-18 NOTE — ED Triage Notes (Signed)
Pain to left scapula, started yesterday patient states she heard a pop, and does have a history of rotator cuff surgery on that shoulder "but it ain't that, this feels different"

## 2020-12-18 NOTE — Discharge Instructions (Signed)
Wear shoulder immobilizer. Follow up with your orthopedist. Apply Lidoderm patch to area as needed as prescribed. Can apply Diclofenac (topical pain medicine) to area. Take Ultram for pain not controlled with Tylenol, Lidoderm, Diclofenac.

## 2020-12-18 NOTE — ED Triage Notes (Signed)
Emergency Medicine Provider Triage Evaluation Note  Melissa [name] , a 70 y.o. female  was evaluated in triage.  Pt complains of left scapula and shoulder pain.  Pain began yesterday while cleaning house when she felt a "pop."  Patient denies any numbness or to extremities, weakness to extremities, recent falls or injuries.  Patient is right-hand dominant.  Review of Systems  Positive: Left shoulder and scapular pain Negative: , Weakness, numbness  Physical Exam  BP (!) 135/96   Pulse 87   Temp 98 F (36.7 C) (Oral)   Resp 18   Ht 5\' 5"  (1.651 m)   Wt 93 kg   SpO2 97%   BMI 34.11 kg/m  Gen:   Awake, no distress   HEENT:  Atraumatic  Resp:  Normal effort  Cardiac:  Normal rate  MSK:   Patient complains of increased pain with attempts to move her left arm , tenderness to left scapula, left trapezius muscle, and left shoulder, no deformity noted Neuro:  Speech clear   Medical Decision Making  Medically screening exam initiated at 2:24 PM.  Appropriate orders placed.  Melissa [name] was informed that the remainder of the evaluation will be completed by another provider, this initial triage assessment does not replace that evaluation, and the importance of remaining in the ED until their evaluation is complete.  Clinical Impression   The patient appears stable so that the remainder of the work up may be completed by another provider.   This   Melissa [name], [place] 12/18/20 1426

## 2020-12-18 NOTE — ED Provider Notes (Signed)
[facility] DEPT Provider Note   CSN: [id number] Arrival date & time: 12/18/20  1315     History Chief Complaint  Patient presents with  . Shoulder Pain    Melissa [name] is a 70 y.o. female.  70 year old female presents with complaint of left shoulder pain, onset yesterday while cleaning house. Patient states she felt a pop in her left scapula area with progressively worsening pain. Pain is worse with any movement of the left shoulder or with palpation along the left scapula. Denies shortness of breath or chest pain. Pain radiates down her left arm at times, no numbness in the left arm. Patient has a history of rotator cuff injury to her right shoulder with upcoming surgery scheduled in June.         Past Medical History:  Diagnosis Date  . Allergy   . Anxiety   . Arthritis    s/p R TKR  . Cyst of left kidney 2015   by lumbar MRI pending renal US  . DDD (degenerative disc disease), lumbar 03/2014   severe L2-3 with L HNP with L2/3 nerve root impingement [name])  . Depression   . Depression with anxiety   . Diabetes type 2, controlled ([place]) 2011   borderline  . Dyspnea   . Glaucoma   . History of ulcer disease   . HLD (hyperlipidemia)    no meds taken  . Hypertension   . PONV (postoperative nausea and vomiting)   . Ulcer     Patient Active Problem List   Diagnosis Date Noted  . Low grade fever 01/17/2020  . Frequent urination 10/14/2019  . OAB (overactive bladder) 10/14/2019  . Encounter for well adult exam with abnormal findings 01/07/2019  . Right-sided face pain 01/07/2019  . Temple tenderness 01/07/2019  . Trigger middle finger of right hand 11/12/2018  . Spondylosis of cervical region without myelopathy or radiculopathy 01/14/2018  . Chronic left shoulder pain 01/08/2018  . Snoring 09/14/2017  . History of total right hip replacement 07/19/2017  . Rotator cuff tear 02/02/2017  . Acquired renal cyst of left kidney  01/01/2016  . Osteoarthritis of spine with radiculopathy, 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  . DDD (degenerative disc disease), lumbar 03/04/2014  . Lumbar facet joint pain 02/24/2014  . Plantar fasciitis of left foot 10/01/2013  . IBS (irritable bowel syndrome) 05/13/2013  . Hammer toe, acquired 02/11/2013  . Metatarsalgia of both feet 02/11/2013  . Pain of left breast 11/13/2012  . Obesity 11/13/2012  . Glaucoma   . Type 2 diabetes mellitus, controlled ([place])   . Anxiety and depression   . Vitreous degeneration, unspecified eye 10/25/2012  . HTN (hypertension) 01/06/2012  . GERD (gastroesophageal reflux disease) 01/06/2012  . Hypercholesteremia 01/06/2012    Past Surgical History:  Procedure Laterality Date  . ABDOMINAL HYSTERECTOMY  2007   fibroids, heavy bleeding  . ARTERY BIOPSY Right 01/23/2019   Procedure: REMOVAL OF PART OF RIGHT TEMPORAL ARTERY FOR BIOPSY;  Surgeon: Michael Boston, MD;  Location: [facility];  Service: General;  Laterality: Right;  . CATARACT EXTRACTION W/ INTRAOCULAR LENS IMPLANT Bilateral   . COLONOSCOPY  04/2017   TA, diverticulosis, rpt ? ([facility])  . GLAUCOMA SURGERY    . HAMMER TOE SURGERY Bilateral   . Right hip replacement    . TONSILLECTOMY    . TOTAL KNEE ARTHROPLASTY Right 2004   TKR (Dr. Eddie Dibbles with [facility])  . TOTAL KNEE REVISION Right 03/2014  Dr Al Corpus [facility]     OB History    Gravida  0   Para      Term      Preterm      AB      Living  0     SAB      IAB      Ectopic      Multiple      Live Births              Family History  Problem Relation Age of Onset  . Cancer Maternal Grandmother 25       ovarian  . Diabetes Maternal Grandmother   . Hypertension Maternal Grandmother   . Cancer Mother 35       bone, MM  . Stroke Other        unsure who  . CAD Neg Hx   . Colon cancer Neg Hx   . Esophageal cancer Neg Hx   . Rectal cancer Neg Hx   . Stomach cancer Neg Hx     Social History   Tobacco Use  . Smoking status:  Never Smoker  . Smokeless tobacco: Never Used  Vaping Use  . Vaping Use: Never used  Substance Use Topics  . Alcohol use: No  . Drug use: No    Home Medications Prior to Admission medications   Medication Sig Start Date End Date Taking? Authorizing Provider  diclofenac Sodium (VOLTAREN) 1 % GEL Apply 2 g topically 4 (four) times daily. 12/18/20  Yes Tacy Learn, PA-C  lidocaine (LIDODERM) 5 % Place 1 patch onto the skin daily. Remove & Discard patch within 12 hours or as directed by MD 12/18/20  Yes Tacy Learn, PA-C  traMADol (ULTRAM) 50 MG tablet Take 1 tablet (50 mg total) by mouth every 6 (six) hours as needed. 12/18/20  Yes Tacy Learn, PA-C  Accu-Chek FastClix Lancets MISC USE TO TEST BLOOD SUGAR UP TO FOUR TIMES DAILY 11/25/18   Lance Sell, NP  ACCU-CHEK GUIDE test strip USE AS DIRECTED FOUR TIMES DAILY 06/28/20   Biagio Borg, MD  acetaminophen (TYLENOL) 500 MG tablet Take 500 mg by mouth every 6 (six) hours as needed for moderate pain or headache.    [provider]  albuterol (VENTOLIN HFA) 108 (90 Base) MCG/ACT inhaler Inhale 2 puffs into the lungs every 4 (four) hours as needed for wheezing or shortness of breath. 05/16/20   Hall-Potvin, [place], PA-C  ALPRAZolam (XANAX) 0.25 MG tablet TAKE 1 TABLET(0.25 MG) BY MOUTH AT BEDTIME AS NEEDED FOR SLEEP 12/04/19   Biagio Borg, MD  aspirin EC 81 MG tablet Take 81 mg by mouth every evening.     [provider]  benzonatate (TESSALON) 100 MG capsule Take 1 capsule (100 mg total) by mouth every 8 (eight) hours. 05/16/20   Hall-Potvin, [place], PA-C  blood glucose meter kit and supplies KIT Dispense based on patient and insurance preference. Use up to four times daily as directed. DX Code: E11.9 02/11/18   Lance Sell, NP  cetirizine (ZYRTEC ALLERGY) 10 MG tablet Take 1 tablet (10 mg total) by mouth daily. 05/16/20   Hall-Potvin, [place], PA-C  citalopram (CELEXA) 20 MG tablet Take 1 tablet (20 mg  total) by mouth daily. 08/27/19   Biagio Borg, MD  dicyclomine (BENTYL) 10 MG capsule Take 1 capsule (10 mg total) by mouth 4 (four) times daily -  before meals and at bedtime. 08/27/19  Biagio Borg, MD  dorzolamide-timolol (COSOPT) 22.3-6.8 MG/ML ophthalmic solution Place 1 drop into both eyes 2 (two) times daily. 01/07/19   Biagio Borg, MD  fluticasone (FLONASE) 50 MCG/ACT nasal spray Place 1 spray into both nostrils daily. 05/16/20   Hall-Potvin, [place], PA-C  glipiZIDE (GLUCOTROL XL) 2.5 MG 24 hr tablet 1 tab by mouth daily only for sugar over 160 01/16/20   Biagio Borg, MD  hydrochlorothiazide (HYDRODIURIL) 25 MG tablet Take 1 tablet (25 mg total) by mouth daily. 05/07/20   Tasia Catchings, Amy V, PA-C  lisinopril (ZESTRIL) 40 MG tablet TAKE 1 TABLET(40 MG) BY MOUTH DAILY 08/10/20   Biagio Borg, MD  metoprolol succinate (TOPROL-XL) 50 MG 24 hr tablet Take 1.5 tablets (75 mg total) by mouth daily. Take with or immediately following a meal. 01/16/20   Biagio Borg, MD  MULTIPLE VITAMIN PO Take 1 tablet by mouth daily.    [provider]  omeprazole (PRILOSEC) 20 MG capsule Take 20 mg by mouth daily as needed (heartburn).    [provider]  solifenacin (VESICARE) 5 MG tablet Take 1 tablet (5 mg total) by mouth daily. 10/14/19   Janith Lima, MD  tiZANidine (ZANAFLEX) 2 MG tablet Take 1 tablet (2 mg total) by mouth every 8 (eight) hours as needed for muscle spasms. 05/07/20   Tasia Catchings, Amy V, PA-C    Allergies    Codeine, Metformin and related, and Oxycodone  Review of Systems   Review of Systems  Constitutional: Negative for fever.  Respiratory: Negative for shortness of breath.   Cardiovascular: Negative for chest pain.  Musculoskeletal: Positive for arthralgias, back pain and myalgias. Negative for neck pain and neck stiffness.  Skin: Negative for color change, rash and wound.  Allergic/Immunologic: Positive for immunocompromised state.  Neurological: Negative for weakness and  numbness.    Physical Exam Updated Vital Signs BP (!) 135/96   Pulse 87   Temp 98 F (36.7 C) (Oral)   Resp 18   Ht _0  (1.651 m)   Wt 93 kg   SpO2 97%   BMI 34.11 kg/m   Physical Exam Vitals and nursing note reviewed.  Constitutional:      General: She is not in acute distress.    Appearance: She is well-developed. She is not diaphoretic.  HENT:     Head: Normocephalic and atraumatic.  Cardiovascular:     Pulses: Normal pulses.  Pulmonary:     Effort: Pulmonary effort is normal.  Musculoskeletal:        General: Tenderness present. No swelling or deformity.       Back:     Comments: No pain with ROM wrist/elbow, significantly limited ROM left shoulder due to pain, TTP posterior left shoulder. Sensation intact, strong radial pulse.   Skin:    General: Skin is warm and dry.     Findings: No erythema or rash.  Neurological:     Mental Status: She is alert and oriented to person, place, and time.     Sensory: No sensory deficit.  Psychiatric:        Behavior: Behavior normal.     ED Results / Procedures / Treatments   Labs (all labs ordered are listed, but only abnormal results are displayed) Labs Reviewed - No data to display  EKG None  Radiology DG Scapula Left  Result Date: 12/18/2020 CLINICAL DATA:  Left scapula and shoulder pain beginning yesterday while cleaning house EXAM: LEFT SHOULDER - 2+ VIEW;  LEFT SCAPULA - 2+ VIEWS COMPARISON:  None. FINDINGS: Left shoulder: Irregularity of the greater tuberosity consistent with rotator cuff tendinopathy. Mild spurring of the glenohumeral joint. Moderate joint space loss and spurring of the acromioclavicular joint. No fracture or dislocation. Left scapula: No fracture or dislocation. IMPRESSION: 1. No acute abnormality of the left shoulder or scapula. 2. Degenerative changes of the left shoulder as above. Electronically Signed   By: Miachel Roux M.D.   On: 12/18/2020 15:19   DG Shoulder Left  Result Date:  12/18/2020 CLINICAL DATA:  Left scapula and shoulder pain beginning yesterday while cleaning house EXAM: LEFT SHOULDER - 2+ VIEW; LEFT SCAPULA - 2+ VIEWS COMPARISON:  None. FINDINGS: Left shoulder: Irregularity of the greater tuberosity consistent with rotator cuff tendinopathy. Mild spurring of the glenohumeral joint. Moderate joint space loss and spurring of the acromioclavicular joint. No fracture or dislocation. Left scapula: No fracture or dislocation. IMPRESSION: 1. No acute abnormality of the left shoulder or scapula. 2. Degenerative changes of the left shoulder as above. Electronically Signed   By: Miachel Roux M.D.   On: 12/18/2020 15:19    Procedures Procedures   Medications Ordered in ED Medications  lidocaine (LIDODERM) 5 % 1 patch (has no administration in time range)    ED Course  I have reviewed the triage vital signs and the nursing notes.  Pertinent labs & imaging results that were available during my care of the patient were reviewed by me and considered in my medical decision making (see chart for details).  Clinical Course as of 12/18/20 1551  Sat Dec 18, 8172  2429 70 year old female with pain in the left shoulder after popping in the shoulder while cleaning yesterday.  On exam has significantly limited range of motion of the shoulder secondary to pain.  Has pain with palpation of the left posterior shoulder area including along the medial border of the left scapula.  X-rays of the scapula and left shoulder show arthritic changes, negative for fracture. Patient is placed in a shoulder immobilizer and given a Lidoderm patch while in the emergency room. Patient is referred back to her orthopedist for follow-up.  She has a codeine allergy and there is a Industrial/product designer, she was given Ultram as well as topical diclofenac and Lidoderm patches. [LM]    Clinical Course User Index [LM] Roque Lias   MDM Rules/Calculators/A&P                          Final Clinical  Impression(s) / ED Diagnoses Final diagnoses:  Acute pain of left shoulder    Rx / DC Orders ED Discharge Orders         Ordered    lidocaine (LIDODERM) 5 %  Every 24 hours        12/18/20 1544    diclofenac Sodium (VOLTAREN) 1 % GEL  4 times daily        12/18/20 1544    traMADol (ULTRAM) 50 MG tablet  Every 6 hours PRN        12/18/20 1544           Tacy Learn, PA-C 12/18/20 1551    Hayden Rasmussen, MD 12/19/20 1000

## 2021-02-09 ENCOUNTER — Other Ambulatory Visit (HOSPITAL_BASED_OUTPATIENT_CLINIC_OR_DEPARTMENT_OTHER): Payer: Self-pay

## 2021-02-09 ENCOUNTER — Other Ambulatory Visit: Payer: Self-pay

## 2021-02-09 ENCOUNTER — Ambulatory Visit: Payer: Medicare Other | Attending: Internal Medicine

## 2021-02-09 DIAGNOSIS — Z23 Encounter for immunization: Secondary | ICD-10-CM

## 2021-02-09 MED ORDER — PFIZER-BIONT COVID-19 VAC-TRIS 30 MCG/0.3ML IM SUSP
INTRAMUSCULAR | 0 refills | Status: DC
  Filled 2021-02-09: qty 0.3, 11d supply, fill #0

## 2021-02-09 NOTE — Progress Notes (Signed)
   Covid-19 Vaccination Clinic  Name:  Melissa [name]    MRN: [id number] DOB: Oct 25, 1950  02/09/2021  Melissa [name] was observed post Covid-19 immunization for 15 minutes without incident. She was provided with Vaccine Information Sheet and instruction to access the V-Safe system.   Melissa [name] was instructed to call 911 with any severe reactions post vaccine: [name] Difficulty breathing  . Swelling of face and throat  . A fast heartbeat  . A bad rash all over body  . Dizziness and weakness   Immunizations Administered    Name Date Dose VIS Date Route   PFIZER Comrnaty(Gray TOP) Covid-19 Vaccine 02/09/2021 12:00 PM 0.3 mL 08/12/2020 Intramuscular   Manufacturer: [employer]   Lot: [id number]   [place]: [phone]

## 2021-02-27 ENCOUNTER — Emergency Department (HOSPITAL_COMMUNITY): Payer: Medicare Other

## 2021-02-27 ENCOUNTER — Other Ambulatory Visit: Payer: Self-pay

## 2021-02-27 ENCOUNTER — Emergency Department (HOSPITAL_COMMUNITY)
Admission: EM | Admit: 2021-02-27 | Discharge: 2021-02-27 | Disposition: A | Discharge status: Home / Self Care | Payer: Medicare Other | Attending: Emergency Medicine | Admitting: Emergency Medicine

## 2021-02-27 DIAGNOSIS — I10 Essential (primary) hypertension: Secondary | ICD-10-CM | POA: Diagnosis not present

## 2021-02-27 DIAGNOSIS — R21 Rash and other nonspecific skin eruption: Secondary | ICD-10-CM

## 2021-02-27 DIAGNOSIS — X58XXXA Exposure to other specified factors, initial encounter: Secondary | ICD-10-CM | POA: Insufficient documentation

## 2021-02-27 DIAGNOSIS — Z7982 Long term (current) use of aspirin: Secondary | ICD-10-CM | POA: Insufficient documentation

## 2021-02-27 DIAGNOSIS — M25511 Pain in right shoulder: Secondary | ICD-10-CM

## 2021-02-27 DIAGNOSIS — Z7984 Long term (current) use of oral hypoglycemic drugs: Secondary | ICD-10-CM | POA: Diagnosis not present

## 2021-02-27 DIAGNOSIS — E119 Type 2 diabetes mellitus without complications: Secondary | ICD-10-CM | POA: Diagnosis not present

## 2021-02-27 DIAGNOSIS — Z79899 Other long term (current) drug therapy: Secondary | ICD-10-CM | POA: Diagnosis not present

## 2021-02-27 DIAGNOSIS — S40221A Blister (nonthermal) of right shoulder, initial encounter: Secondary | ICD-10-CM | POA: Diagnosis not present

## 2021-02-27 DIAGNOSIS — Z96651 Presence of right artificial knee joint: Secondary | ICD-10-CM | POA: Diagnosis not present

## 2021-02-27 DIAGNOSIS — S4991XA Unspecified injury of right shoulder and upper arm, initial encounter: Secondary | ICD-10-CM | POA: Diagnosis present

## 2021-02-27 LAB — CBC WITH DIFFERENTIAL/PLATELET
Abs Immature Granulocytes: 0.01 10*3/uL (ref 0.00–0.07)
Basophils Absolute: 0 10*3/uL (ref 0.0–0.1)
Basophils Relative: 1 %
Eosinophils Absolute: 0.4 10*3/uL (ref 0.0–0.5)
Eosinophils Relative: 6 %
HCT: 33 % — ABNORMAL LOW (ref 36.0–46.0)
Hemoglobin: 10.1 g/dL — ABNORMAL LOW (ref 12.0–15.0)
Immature Granulocytes: 0 %
Lymphocytes Relative: 30 %
Lymphs Abs: 1.7 10*3/uL (ref 0.7–4.0)
MCH: 26 pg (ref 26.0–34.0)
MCHC: 30.6 g/dL (ref 30.0–36.0)
MCV: 84.8 fL (ref 80.0–100.0)
Monocytes Absolute: 0.5 10*3/uL (ref 0.1–1.0)
Monocytes Relative: 8 %
Neutro Abs: 3.2 10*3/uL (ref 1.7–7.7)
Neutrophils Relative %: 55 %
Platelets: 380 10*3/uL (ref 150–400)
RBC: 3.89 MIL/uL (ref 3.87–5.11)
RDW: 15.4 % (ref 11.5–15.5)
WBC: 5.8 10*3/uL (ref 4.0–10.5)
nRBC: 0 % (ref 0.0–0.2)

## 2021-02-27 LAB — BASIC METABOLIC PANEL
Anion gap: 9 (ref 5–15)
BUN: 10 mg/dL (ref 8–23)
CO2: 22 mmol/L (ref 22–32)
Calcium: 9.1 mg/dL (ref 8.9–10.3)
Chloride: 107 mmol/L (ref 98–111)
Creatinine, Ser: 0.79 mg/dL (ref 0.44–1.00)
GFR, Estimated: >60 mL/min (ref >60)
Glucose, Bld: 86 mg/dL (ref 70–99)
Potassium: 3.8 mmol/L (ref 3.5–5.1)
Sodium: 138 mmol/L (ref 135–145)

## 2021-02-27 LAB — C-REACTIVE PROTEIN: CRP: 2.6 mg/dL — ABNORMAL HIGH (ref <1.0)

## 2021-02-27 LAB — LACTIC ACID, PLASMA: Lactic Acid, Venous: 0.6 mmol/L (ref 0.5–1.9)

## 2021-02-27 LAB — SEDIMENTATION RATE: Sed Rate: 75 mm/hr — ABNORMAL HIGH (ref 0–22)

## 2021-02-27 MED ORDER — IOHEXOL 300 MG/ML  SOLN
100.0000 mL | Freq: Once | INTRAMUSCULAR | Status: AC | PRN
  Administered 2021-02-27: 100 mL via INTRAVENOUS

## 2021-02-27 MED ORDER — SODIUM CHLORIDE 0.9 % IV SOLN: INTRAVENOUS | Status: DC

## 2021-02-27 NOTE — ED Notes (Signed)
IV start attempted with no success.  IV team IV start requested

## 2021-02-27 NOTE — ED Triage Notes (Signed)
Pt POV- c/o right shoulder wound problem.  Pt reports having right shoulder replacement 6/15, allergic reaction to surgical glue.  Surgeon provided benadryl, no relief.

## 2021-02-27 NOTE — ED Notes (Signed)
D/c paperwork reviewed with pt.  Pt with no questions or concerns at time of d/c. Ambulatory to ED exit.

## 2021-02-27 NOTE — ED Provider Notes (Addendum)
[facility] DEPT Provider Note   CSN: [id number] Arrival date & time: 02/27/21  1215     History Chief Complaint  Patient presents with   Wound Infection    Melissa [name] is a 70 y.o. female.  70 year old female who presents with rash to her right shoulder.  Patient had shoulder placement surgery 11 days ago and states that the adhesive used on top of the wound is caused her to have a reaction.  She endorses increased pruritus.  Denies any fever.  Pain is worse with movement.  Has been treating it with topical medications without relief.  Denies any drainage from the wound.      Past Medical History:  Diagnosis Date   Allergy    Anxiety    Arthritis    s/p R TKR   Cyst of left kidney 2015   by lumbar MRI pending renal US   DDD (degenerative disc disease), lumbar 03/2014   severe L2-3 with L HNP with L2/3 nerve root impingement [name])   Depression    Depression with anxiety    Diabetes type 2, controlled ([place]) 2011   borderline   Dyspnea    Glaucoma    History of ulcer disease    HLD (hyperlipidemia)    no meds taken   Hypertension    PONV (postoperative nausea and vomiting)    Ulcer     Patient Active Problem List   Diagnosis Date Noted   Low grade fever 01/17/2020   Frequent urination 10/14/2019   OAB (overactive bladder) 10/14/2019   Encounter for well adult exam with abnormal findings 01/07/2019   Right-sided face pain 01/07/2019   Temple tenderness 01/07/2019   Trigger middle finger of right hand 11/12/2018   Spondylosis of cervical region without myelopathy or radiculopathy 01/14/2018   Chronic left shoulder pain 01/08/2018   Snoring 09/14/2017   History of total right hip replacement 07/19/2017   Rotator cuff tear 02/02/2017   Acquired renal cyst of left kidney 01/01/2016   Osteoarthritis of spine with radiculopathy, 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   DDD (degenerative disc disease), lumbar 03/04/2014   Lumbar facet  joint pain 02/24/2014   Plantar fasciitis of left foot 10/01/2013   IBS (irritable bowel syndrome) 05/13/2013   Hammer toe, acquired 02/11/2013   Metatarsalgia of both feet 02/11/2013   Pain of left breast 11/13/2012   Obesity 11/13/2012   Glaucoma    Type 2 diabetes mellitus, controlled ([place])    Anxiety and depression    Vitreous degeneration, unspecified eye 10/25/2012   HTN (hypertension) 01/06/2012   GERD (gastroesophageal reflux disease) 01/06/2012   Hypercholesteremia 01/06/2012    Past Surgical History:  Procedure Laterality Date   ABDOMINAL HYSTERECTOMY  2007   fibroids, heavy bleeding   ARTERY BIOPSY Right 01/23/2019   Procedure: REMOVAL OF PART OF RIGHT TEMPORAL ARTERY FOR BIOPSY;  Surgeon: Michael Boston, MD;  Location: [facility];  Service: General;  Laterality: Right;   CATARACT EXTRACTION W/ INTRAOCULAR LENS IMPLANT Bilateral    COLONOSCOPY  04/2017   TA, diverticulosis, rpt ? ([facility])   GLAUCOMA SURGERY     HAMMER TOE SURGERY Bilateral    Right hip replacement     TONSILLECTOMY     TOTAL KNEE ARTHROPLASTY Right 2004   TKR (Dr. Eddie Dibbles with [facility])   TOTAL KNEE REVISION Right 03/2014   Dr Al Corpus [facility]     OB History     Gravida  0   Para  Term      Preterm      AB      Living  0      SAB      IAB      Ectopic      Multiple      Live Births              Family History  Problem Relation Age of Onset   Cancer Maternal Grandmother 50       ovarian   Diabetes Maternal Grandmother    Hypertension Maternal Grandmother    Cancer Mother 37       bone, MM   Stroke Other        unsure who   CAD Neg Hx    Colon cancer Neg Hx    Esophageal cancer Neg Hx    Rectal cancer Neg Hx    Stomach cancer Neg Hx     Social History   Tobacco Use   Smoking status: Never   Smokeless tobacco: Never  Vaping Use   Vaping Use: Never used  Substance Use Topics   Alcohol use: No   Drug use: No    Home Medications Prior to Admission medications    Medication Sig Start Date End Date Taking? Authorizing Provider  Accu-Chek FastClix Lancets MISC USE TO TEST BLOOD SUGAR UP TO FOUR TIMES DAILY 11/25/18   Lance Sell, NP  ACCU-CHEK GUIDE test strip USE AS DIRECTED FOUR TIMES DAILY 06/28/20   Biagio Borg, MD  acetaminophen (TYLENOL) 500 MG tablet Take 500 mg by mouth every 6 (six) hours as needed for moderate pain or headache.    [provider]  albuterol (VENTOLIN HFA) 108 (90 Base) MCG/ACT inhaler Inhale 2 puffs into the lungs every 4 (four) hours as needed for wheezing or shortness of breath. 05/16/20   Hall-Potvin, [place], PA-C  ALPRAZolam (XANAX) 0.25 MG tablet TAKE 1 TABLET(0.25 MG) BY MOUTH AT BEDTIME AS NEEDED FOR SLEEP 12/04/19   Biagio Borg, MD  aspirin EC 81 MG tablet Take 81 mg by mouth every evening.     [provider]  benzonatate (TESSALON) 100 MG capsule Take 1 capsule (100 mg total) by mouth every 8 (eight) hours. 05/16/20   Hall-Potvin, [place], PA-C  blood glucose meter kit and supplies KIT Dispense based on patient and insurance preference. Use up to four times daily as directed. DX Code: E11.9 02/11/18   Lance Sell, NP  cetirizine (ZYRTEC ALLERGY) 10 MG tablet Take 1 tablet (10 mg total) by mouth daily. 05/16/20   Hall-Potvin, [place], PA-C  citalopram (CELEXA) 20 MG tablet Take 1 tablet (20 mg total) by mouth daily. 08/27/19   Biagio Borg, MD  COVID-19 mRNA Vac-TriS, Pfizer, (PFIZER-BIONT COVID-19 VAC-TRIS) SUSP injection Inject into the muscle. 02/09/21   Carlyle Basques, MD  diclofenac Sodium (VOLTAREN) 1 % GEL Apply 2 g topically 4 (four) times daily. 12/18/20   Tacy Learn, PA-C  dicyclomine (BENTYL) 10 MG capsule Take 1 capsule (10 mg total) by mouth 4 (four) times daily -  before meals and at bedtime. 08/27/19   Biagio Borg, MD  dorzolamide-timolol (COSOPT) 22.3-6.8 MG/ML ophthalmic solution Place 1 drop into both eyes 2 (two) times daily. 01/07/19   Biagio Borg, MD   fluticasone (FLONASE) 50 MCG/ACT nasal spray Place 1 spray into both nostrils daily. 05/16/20   Hall-Potvin, [place], PA-C  glipiZIDE (GLUCOTROL XL) 2.5 MG 24 hr tablet 1 tab by mouth  daily only for sugar over 160 01/16/20   Biagio Borg, MD  hydrochlorothiazide (HYDRODIURIL) 25 MG tablet Take 1 tablet (25 mg total) by mouth daily. 05/07/20   Tasia Catchings, Amy V, PA-C  lidocaine (LIDODERM) 5 % Place 1 patch onto the skin daily. Remove & Discard patch within 12 hours or as directed by MD 12/18/20   Suella Broad A, PA-C  lisinopril (ZESTRIL) 40 MG tablet TAKE 1 TABLET(40 MG) BY MOUTH DAILY 08/10/20   Biagio Borg, MD  metoprolol succinate (TOPROL-XL) 50 MG 24 hr tablet Take 1.5 tablets (75 mg total) by mouth daily. Take with or immediately following a meal. 01/16/20   Biagio Borg, MD  MULTIPLE VITAMIN PO Take 1 tablet by mouth daily.    [provider]  omeprazole (PRILOSEC) 20 MG capsule Take 20 mg by mouth daily as needed (heartburn).    [provider]  solifenacin (VESICARE) 5 MG tablet Take 1 tablet (5 mg total) by mouth daily. 10/14/19   Janith Lima, MD  tiZANidine (ZANAFLEX) 2 MG tablet Take 1 tablet (2 mg total) by mouth every 8 (eight) hours as needed for muscle spasms. 05/07/20   Tasia Catchings, Amy V, PA-C  traMADol (ULTRAM) 50 MG tablet Take 1 tablet (50 mg total) by mouth every 6 (six) hours as needed. 12/18/20   Tacy Learn, PA-C    Allergies    Codeine, Metformin and related, and Oxycodone  Review of Systems   Review of Systems  All other systems reviewed and are negative.  Physical Exam Updated Vital Signs BP (!) 152/101 (BP Location: Left Arm)   Pulse 90   Temp 98.3 F (36.8 C) (Oral)   Resp 18   Ht 1.651 m (5' 5" )   Wt 93 kg   SpO2 99%   BMI 34.12 kg/m   Physical Exam Vitals and nursing note reviewed.  Constitutional:      General: She is not in acute distress.    Appearance: Normal appearance. She is well-developed. She is not toxic-appearing.  HENT:     Head:  Normocephalic and atraumatic.  Eyes:     General: Lids are normal.     Conjunctiva/sclera: Conjunctivae normal.     Pupils: Pupils are equal, round, and reactive to light.  Neck:     Thyroid: No thyroid mass.     Trachea: No tracheal deviation.  Cardiovascular:     Rate and Rhythm: Normal rate and regular rhythm.     Heart sounds: Normal heart sounds. No murmur heard.   No gallop.  Pulmonary:     Effort: Pulmonary effort is normal. No respiratory distress.     Breath sounds: Normal breath sounds. No stridor. No decreased breath sounds, wheezing, rhonchi or rales.  Abdominal:     General: There is no distension.     Palpations: Abdomen is soft.     Tenderness: There is no abdominal tenderness. There is no rebound.  Musculoskeletal:        General: No tenderness. Normal range of motion.       Arms:     Cervical back: Normal range of motion and neck supple.     Comments: Neurovascular intact at right hand  Skin:    General: Skin is warm and dry.     Findings: No abrasion.  Neurological:     Mental Status: She is alert and oriented to person, place, and time. Mental status is at baseline.     GCS: GCS eye subscore  is 4. GCS verbal subscore is 5. GCS motor subscore is 6.     Cranial Nerves: Cranial nerves are intact. No cranial nerve deficit.     Sensory: No sensory deficit.     Motor: Motor function is intact.  Psychiatric:        Attention and Perception: Attention normal.        Speech: Speech normal.        Behavior: Behavior normal.     ED Results / Procedures / Treatments   Labs (all labs ordered are listed, but only abnormal results are displayed) Labs Reviewed - No data to display  EKG None  Radiology No results found.  Procedures Procedures   Medications Ordered in ED Medications - No data to display  ED Course  I have reviewed the triage vital signs and the nursing notes.  Pertinent labs & imaging results that were available during my care of the  patient were reviewed by me and considered in my medical decision making (see chart for details).    MDM Rules/Calculators/A&P                          Labs and xrays pending Will sign out to dr butler Final Clinical Impression(s) / ED Diagnoses Final diagnoses:  None    Rx / DC Orders ED Discharge Orders     None        Lacretia Leigh, MD 02/27/21 1506    Lacretia Leigh, MD 02/27/21 1520

## 2021-02-27 NOTE — ED Provider Notes (Signed)
Signout from Dr. Zenia Resides.  70 year old female who is approximately 11 days of right total shoulder replacement by Dr. Len Childs out of [facility].  She has had continued pain and new blistering rash over her surgical incision. Physical Exam  BP (!) 159/124   Pulse 78   Temp 98.3 F (36.8 C) (Oral)   Resp 18   Ht 5' 5"  (1.651 m)   Wt 93 kg   SpO2 99%   BMI 34.12 kg/m   Physical Exam    ED Course/Procedures     Procedures  MDM  ED team unable to obtain access.  I took a look and did not feel confident in a spot up on her left basilic.  Charge nurse was able to access the vein and secure IV access and get labs.  Patient's lab work is fairly unremarkable other than an elevated CRP and ESR.  CT showing no deep space infection but does have some superficial skin blistering.  I discussed with orthopedics on-call for Dr. Len Childs, Dr. Laurance Flatten.  He recommends no antibiotics and follow-up with Dr. Len Childs in the morning.       Hayden Rasmussen, MD 02/28/21 [phone]

## 2021-02-27 NOTE — ED Notes (Signed)
Pt unable to sign MSE waiver due to R arm restriction

## 2021-02-27 NOTE — ED Notes (Signed)
Per IV team RN- pt with no visualized vein access.  EDP Melina Copa made aware. Charge RN made aware.

## 2021-02-27 NOTE — Discharge Instructions (Addendum)
You were seen in the emergency department for blistering rash at your incision line for your shoulder replacement.  Your lab work showed your inflammation numbers to be elevated but not your infection cell count and you did not have a fever.  You had a CAT scan that did not show any deep infection.  Please contact your orthopedic doctor tomorrow to be seen as soon as possible.  You can use Benadryl for itching.

## 2021-03-04 LAB — CULTURE, BLOOD (ROUTINE X 2): Culture: NO GROWTH

## 2021-04-12 DIAGNOSIS — M25511 Pain in right shoulder: Secondary | ICD-10-CM | POA: Diagnosis not present

## 2021-04-12 DIAGNOSIS — M25611 Stiffness of right shoulder, not elsewhere classified: Secondary | ICD-10-CM | POA: Diagnosis not present

## 2021-04-19 DIAGNOSIS — M25611 Stiffness of right shoulder, not elsewhere classified: Secondary | ICD-10-CM | POA: Diagnosis not present

## 2021-04-19 DIAGNOSIS — M25511 Pain in right shoulder: Secondary | ICD-10-CM | POA: Diagnosis not present

## 2021-04-20 DIAGNOSIS — H401111 Primary open-angle glaucoma, right eye, mild stage: Secondary | ICD-10-CM | POA: Diagnosis not present

## 2021-04-20 DIAGNOSIS — H04123 Dry eye syndrome of bilateral lacrimal glands: Secondary | ICD-10-CM | POA: Diagnosis not present

## 2021-04-20 DIAGNOSIS — H401123 Primary open-angle glaucoma, left eye, severe stage: Secondary | ICD-10-CM | POA: Diagnosis not present

## 2021-04-21 DIAGNOSIS — M25511 Pain in right shoulder: Secondary | ICD-10-CM | POA: Diagnosis not present

## 2021-04-21 DIAGNOSIS — M25611 Stiffness of right shoulder, not elsewhere classified: Secondary | ICD-10-CM | POA: Diagnosis not present

## 2021-04-28 DIAGNOSIS — M25611 Stiffness of right shoulder, not elsewhere classified: Secondary | ICD-10-CM | POA: Diagnosis not present

## 2021-04-28 DIAGNOSIS — M25511 Pain in right shoulder: Secondary | ICD-10-CM | POA: Diagnosis not present

## 2021-04-29 ENCOUNTER — Ambulatory Visit: Payer: Medicare Other | Admitting: Orthopaedic Surgery

## 2021-05-03 ENCOUNTER — Encounter: Payer: Self-pay | Admitting: Dietician

## 2021-05-05 ENCOUNTER — Encounter: Payer: Medicare Other | Admitting: Internal Medicine

## 2021-05-10 ENCOUNTER — Encounter: Payer: Self-pay | Admitting: Orthopaedic Surgery

## 2021-05-10 ENCOUNTER — Ambulatory Visit: Payer: Self-pay

## 2021-05-10 ENCOUNTER — Ambulatory Visit (INDEPENDENT_AMBULATORY_CARE_PROVIDER_SITE_OTHER): Payer: Medicare Other | Admitting: Orthopaedic Surgery

## 2021-05-10 VITALS — Ht 65.0 in | Wt 197.4 lb

## 2021-05-10 DIAGNOSIS — E119 Type 2 diabetes mellitus without complications: Secondary | ICD-10-CM

## 2021-05-10 DIAGNOSIS — M1712 Unilateral primary osteoarthritis, left knee: Secondary | ICD-10-CM

## 2021-05-10 NOTE — Progress Notes (Signed)
Office Visit Note   Patient: Melissa [name]           Date of Birth: 1950/10/16           MRN: [id number] Visit Date: 05/10/2021              Requested by: Jola Baptist, PA-C [id number],  [place] [address] PCP: Jola Baptist, PA-C   Assessment & Plan: Visit Diagnoses:  1. Primary osteoarthritis of left knee   2. Controlled type 2 diabetes mellitus without complication, without long-term current use of insulin (HCC)     Plan: Impression is end-stage left knee DJD with out significant relief from prior cortisone injections and conservative treatments.  She is severely affected by the pain and is limiting her ADLs and ability to work.  Based on her options she would like to move forward with left total knee replacement around mid November.  We will obtain preoperative clearance from PCP Dorian Pod.  For the previous knee replacement she did well with PT and was able to go home from the hospital.  Follow-Up Instructions: Return if symptoms worsen or fail to improve.   Orders:  Orders Placed This Encounter  Procedures   XR KNEE 3 VIEW LEFT   No orders of the defined types were placed in this encounter.     Procedures: No procedures performed   Clinical Data: No additional findings.   Subjective: Chief Complaint  Patient presents with   Left Knee - New Patient (Initial Visit)    Melissa [name] is a very pleasant 70 year old female here for evaluation of chronic left knee pain.  This has been gradually getting worse over the last year.  Pain is 9 out of 10.  Had a cortisone injection about a month ago which is only provided temporary and partial relief.  She is status post right total knee replacement years ago which went well.  She is currently ambulating with a quad cane.  Denies any injuries.  Has not found much relief from the knee brace.  She has well-controlled diabetes.  Underwent right total shoulder replacement on 02/16/2021 by Dr. Geryl Rankins at  [facility].  Her goal is to return back to work in the spring 2023.   Review of Systems  Constitutional: Negative.   HENT: Negative.    Eyes: Negative.   Respiratory: Negative.    Cardiovascular: Negative.   Endocrine: Negative.   Musculoskeletal: Negative.   Neurological: Negative.   Hematological: Negative.   Psychiatric/Behavioral: Negative.    All other systems reviewed and are negative.   Objective: Vital Signs: Ht '5\' 5"'$  (1.651 m)   Wt 197 lb 6.4 oz (89.5 kg)   BMI 32.85 kg/m   Physical Exam Vitals and nursing note reviewed.  Constitutional:      Appearance: She is well-developed.  HENT:     Head: Normocephalic and atraumatic.  Pulmonary:     Effort: Pulmonary effort is normal.  Abdominal:     Palpations: Abdomen is soft.  Musculoskeletal:     Cervical back: Neck supple.  Skin:    General: Skin is warm.     Capillary Refill: Capillary refill takes less than 2 seconds.  Neurological:     Mental Status: She is alert and oriented to person, place, and time.  Psychiatric:        Behavior: Behavior normal.        Thought Content: Thought content normal.  Judgment: Judgment normal.    Ortho Exam Left knee shows significant pain with range of motion.  There is patellofemoral crepitus as well.  Range of motion is slightly limited.  Collaterals and cruciates are stable.  Trace effusion.  There is pain past 100 degrees of flexion.  No meniscal signs.  Skin is normal. Specialty Comments:  No specialty comments available.  Imaging: XR KNEE 3 VIEW LEFT  Result Date: 05/10/2021 Advanced tricompartmental DJD.  No significant malalignment.    PMFS History: Patient Active Problem List   Diagnosis Date Noted   Low grade fever 01/17/2020   Frequent urination 10/14/2019   OAB (overactive bladder) 10/14/2019   Encounter for well adult exam with abnormal findings 01/07/2019   Right-sided face pain 01/07/2019   Temple tenderness 01/07/2019   Trigger middle finger  of right hand 11/12/2018   Spondylosis of cervical region without myelopathy or radiculopathy 01/14/2018   Chronic left shoulder pain 01/08/2018   Snoring 09/14/2017   History of total right hip replacement 07/19/2017   Rotator cuff tear 02/02/2017   Acquired renal cyst of left kidney 01/01/2016   Osteoarthritis of spine with radiculopathy, 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   DDD (degenerative disc disease), lumbar 03/04/2014   Lumbar facet joint pain 02/24/2014   Plantar fasciitis of left foot 10/01/2013   IBS (irritable bowel syndrome) 05/13/2013   Hammer toe, acquired 02/11/2013   Metatarsalgia of both feet 02/11/2013   Pain of left breast 11/13/2012   Obesity 11/13/2012   Glaucoma    Type 2 diabetes mellitus, controlled ([place])    Anxiety and depression    Vitreous degeneration, unspecified eye 10/25/2012   HTN (hypertension) 01/06/2012   GERD (gastroesophageal reflux disease) 01/06/2012   Hypercholesteremia 01/06/2012   Past Medical History:  Diagnosis Date   Allergy    Anxiety    Arthritis    s/p R TKR   Cyst of left kidney 2015   by lumbar MRI pending renal US   DDD (degenerative disc disease), lumbar 03/2014   severe L2-3 with L HNP with L2/3 nerve root impingement [name])   Depression    Depression with anxiety    Diabetes type 2, controlled ([place]) 2011   borderline   Dyspnea    Glaucoma    History of ulcer disease    HLD (hyperlipidemia)    no meds taken   Hypertension    PONV (postoperative nausea and vomiting)    Ulcer     Family History  Problem Relation Age of Onset   Cancer Maternal Grandmother 69       ovarian   Diabetes Maternal Grandmother    Hypertension Maternal Grandmother    Cancer Mother 4       bone, MM   Stroke Other        unsure who   CAD Neg Hx    Colon cancer Neg Hx    Esophageal cancer Neg Hx    Rectal cancer Neg Hx    Stomach cancer Neg Hx     Past Surgical History:  Procedure Laterality Date   ABDOMINAL HYSTERECTOMY  2007    fibroids, heavy bleeding   ARTERY BIOPSY Right 01/23/2019   Procedure: REMOVAL OF PART OF RIGHT TEMPORAL ARTERY FOR BIOPSY;  Surgeon: Michael Boston, MD;  Location: [facility];  Service: General;  Laterality: Right;   CATARACT EXTRACTION W/ INTRAOCULAR LENS IMPLANT Bilateral    COLONOSCOPY  04/2017   TA, diverticulosis, rpt ? ([place])   GLAUCOMA SURGERY  HAMMER TOE SURGERY Bilateral    Right hip replacement     TONSILLECTOMY     TOTAL KNEE ARTHROPLASTY Right 2004   TKR (Dr. Eddie Dibbles with [facility])   TOTAL KNEE REVISION Right 03/2014   Dr Al Corpus [facility]   Social History   Occupational History   Occupation: work with disabilities/ Retired    Fish farm manager: [employer]  Tobacco Use   Smoking status: Never   Smokeless tobacco: Never  Vaping Use   Vaping Use: Never used  Substance and Sexual Activity   Alcohol use: No   Drug use: No   Sexual activity: Not Currently    Birth control/protection: Surgical

## 2021-05-11 ENCOUNTER — Ambulatory Visit (INDEPENDENT_AMBULATORY_CARE_PROVIDER_SITE_OTHER): Payer: Medicare Other | Admitting: Internal Medicine

## 2021-05-11 ENCOUNTER — Other Ambulatory Visit: Payer: Self-pay

## 2021-05-11 VITALS — BP 124/77 | HR 71 | Temp 97.8°F | Ht 65.0 in | Wt 199.3 lb

## 2021-05-11 DIAGNOSIS — F419 Anxiety disorder, unspecified: Secondary | ICD-10-CM | POA: Diagnosis not present

## 2021-05-11 DIAGNOSIS — G4730 Sleep apnea, unspecified: Secondary | ICD-10-CM | POA: Diagnosis not present

## 2021-05-11 DIAGNOSIS — F32A Depression, unspecified: Secondary | ICD-10-CM | POA: Diagnosis not present

## 2021-05-11 DIAGNOSIS — G8929 Other chronic pain: Secondary | ICD-10-CM

## 2021-05-11 DIAGNOSIS — E7800 Pure hypercholesterolemia, unspecified: Secondary | ICD-10-CM | POA: Diagnosis not present

## 2021-05-11 DIAGNOSIS — Z23 Encounter for immunization: Secondary | ICD-10-CM | POA: Diagnosis not present

## 2021-05-11 DIAGNOSIS — I10 Essential (primary) hypertension: Secondary | ICD-10-CM | POA: Diagnosis not present

## 2021-05-11 DIAGNOSIS — K219 Gastro-esophageal reflux disease without esophagitis: Secondary | ICD-10-CM | POA: Diagnosis not present

## 2021-05-11 DIAGNOSIS — Z6833 Body mass index (BMI) 33.0-33.9, adult: Secondary | ICD-10-CM

## 2021-05-11 DIAGNOSIS — E669 Obesity, unspecified: Secondary | ICD-10-CM

## 2021-05-11 DIAGNOSIS — E119 Type 2 diabetes mellitus without complications: Secondary | ICD-10-CM | POA: Diagnosis not present

## 2021-05-11 DIAGNOSIS — M25512 Pain in left shoulder: Secondary | ICD-10-CM

## 2021-05-11 LAB — POCT GLYCOSYLATED HEMOGLOBIN (HGB A1C): Hemoglobin A1C: 5.5 % (ref 4.0–5.6)

## 2021-05-11 LAB — GLUCOSE, CAPILLARY: Glucose-Capillary: 82 mg/dL (ref 70–99)

## 2021-05-11 MED ORDER — LISINOPRIL 20 MG PO TABS: 20.0000 mg | ORAL_TABLET | Freq: Every day | ORAL | 3 refills | Status: DC

## 2021-05-11 NOTE — Patient Instructions (Signed)
Melissa [name], it was lovely to meet you today!  We spent most of our time discussing your joint problems and upcoming knee surgery, and reviewing your medicines.  Your diabetes control is outstanding, as is your blood pressure.  Keep up the good work.  We discussed stopping the aspirin (you don't need it), and if you'd like to try, you may increase the tylenol to 2 of the 500 mg tabs up to 3 times a day which may help with your pain.  I'll send a referral for you to speak with Dr. Kerin Salen, our clinical therapist.  She is wonderful.  Let's get together after your surgery and after the holidays.  Take care and stay well!  Dr. Jimmye Norman

## 2021-05-11 NOTE — Progress Notes (Signed)
70 yo Melissa [name] is here to establish care in our clinic, formerly attending [facility].  Location in [place] is more reasonable.  Her most functionally impactful problems over the past several years have been orthopedic; she lives with chronic pain.  She has undergone replacements of R shoulder ([id number] - healing complicated by severe allergic rxn to the dermabond, still recovering), R knee 2021, and B hips, and a L shoulder rotator cuff repair.  Though she continues to rehab from her R shoulder replacement, she is scheduled to have L TKR on 07/11/21 with Dr. Gordy Clement.  Experiencing R sided LBP, radiates down lateral L leg .  Due for spinal injections, completed last year, nerve blocks - series of 4 or so, weekly.  Has appt with them next week 05/18/21. She would like to have this addressed before undergoing the TKR, so timing is critical. [facility].  Needs medical risk assessment signed for upcoming knee (form to be faxed to our office by surgeon).    No exertional dyspnea No chest pressure, pain, palipatiation  BP 124/77 (BP Location: Right Arm, Patient Position: Sitting, Cuff Size: Small)   Pulse 71   Temp 97.8 F (36.6 C) (Oral)   Ht '5\' 5"'$  (1.651 m)   Wt 199 lb 4.8 oz (90.4 kg)   SpO2 100%   BMI 33.17 kg/m   On exam, L knee effusion, tender over joint lines.   Assessment and plan (see also problem based documentation):  Behavioral health referral for coping strategies, family member causing stress.    Flu shot today  May be best to delay the L TKR to allow for injections and more shoulder rehab to set her up for success. She agrees.

## 2021-05-18 DIAGNOSIS — M25562 Pain in left knee: Secondary | ICD-10-CM | POA: Diagnosis not present

## 2021-05-18 DIAGNOSIS — G8929 Other chronic pain: Secondary | ICD-10-CM | POA: Diagnosis not present

## 2021-05-18 DIAGNOSIS — M545 Low back pain, unspecified: Secondary | ICD-10-CM | POA: Diagnosis not present

## 2021-05-18 DIAGNOSIS — M47816 Spondylosis without myelopathy or radiculopathy, lumbar region: Secondary | ICD-10-CM | POA: Diagnosis not present

## 2021-05-20 ENCOUNTER — Other Ambulatory Visit: Payer: Self-pay

## 2021-05-24 ENCOUNTER — Encounter: Payer: Self-pay | Admitting: Internal Medicine

## 2021-05-24 DIAGNOSIS — G4730 Sleep apnea, unspecified: Secondary | ICD-10-CM | POA: Insufficient documentation

## 2021-05-24 NOTE — Assessment & Plan Note (Addendum)
Currently not on statin due to hx of myalgia, though her LDL and TC are quite high.  We'll discuss further as I get to know her situation.  She notes that she has pravastatin in her possession but doesn't take it.

## 2021-05-24 NOTE — Assessment & Plan Note (Signed)
Hx of rotator cuff repair.

## 2021-05-24 NOTE — Assessment & Plan Note (Addendum)
Behavioral health referral for coping strategies, family member causing stress.  She is very interested in talk therapy.  Medication includes alprazolam 0.25 mg nightly for sleep.  No longer taking citalopram.

## 2021-05-24 NOTE — Assessment & Plan Note (Signed)
H4Y 5.5 on trulicity 1.5 mg weekly and glucotrol XL 2.5 mg daily.  Discontinue the glucotrol today, not needed, risk of hypoglycemia.

## 2021-05-24 NOTE — Assessment & Plan Note (Signed)
Symptoms controlled primarily through diet, takes omeprazole 20 mg prn infrequently.

## 2021-05-24 NOTE — Assessment & Plan Note (Signed)
Interested in nutrition consultation toward healthy eating and weight loss though wishes to postpone until her acute ortho problems have settled.

## 2021-05-24 NOTE — Assessment & Plan Note (Signed)
BP 124/77 at goal on HCTZ 25 mg, lisinopril 20 mg daily. NO changes.  Monitor.

## 2021-06-01 ENCOUNTER — Encounter: Payer: Self-pay | Admitting: Internal Medicine

## 2021-06-02 DIAGNOSIS — M47816 Spondylosis without myelopathy or radiculopathy, lumbar region: Secondary | ICD-10-CM | POA: Diagnosis not present

## 2021-06-08 ENCOUNTER — Encounter: Payer: Self-pay | Admitting: Internal Medicine

## 2021-06-08 ENCOUNTER — Ambulatory Visit (INDEPENDENT_AMBULATORY_CARE_PROVIDER_SITE_OTHER): Payer: Medicare Other | Admitting: Internal Medicine

## 2021-06-08 ENCOUNTER — Other Ambulatory Visit: Payer: Self-pay

## 2021-06-08 ENCOUNTER — Ambulatory Visit (HOSPITAL_COMMUNITY)
Admission: RE | Admit: 2021-06-08 | Discharge: 2021-06-08 | Disposition: A | Discharge status: Home / Self Care | Payer: Medicare Other | Source: Ambulatory Visit | Attending: Internal Medicine | Admitting: Internal Medicine

## 2021-06-08 VITALS — BP 136/85 | HR 79 | Temp 98.4°F | Resp 24 | Ht 65.0 in | Wt 203.0 lb

## 2021-06-08 DIAGNOSIS — F32A Depression, unspecified: Secondary | ICD-10-CM | POA: Diagnosis not present

## 2021-06-08 DIAGNOSIS — M79675 Pain in left toe(s): Secondary | ICD-10-CM | POA: Insufficient documentation

## 2021-06-08 DIAGNOSIS — F419 Anxiety disorder, unspecified: Secondary | ICD-10-CM | POA: Diagnosis not present

## 2021-06-08 DIAGNOSIS — I10 Essential (primary) hypertension: Secondary | ICD-10-CM

## 2021-06-08 DIAGNOSIS — M19072 Primary osteoarthritis, left ankle and foot: Secondary | ICD-10-CM | POA: Diagnosis not present

## 2021-06-08 MED ORDER — NAPROXEN 500 MG PO TABS: 500.0000 mg | ORAL_TABLET | Freq: Two times a day (BID) | ORAL | 0 refills | Status: AC

## 2021-06-08 NOTE — Assessment & Plan Note (Signed)
BP Readings from Last 3 Encounters:  06/08/21 136/85  05/11/21 124/77  02/27/21 (!) 168/104   Plan: Continue HCTZ 25mg  daily and lisinopril 20mg  daily F/u in 3-6 months

## 2021-06-08 NOTE — Patient Instructions (Addendum)
Melissa [name],  It was a pleasure seeing you in clinic. Today we discussed:   Left great toe pain:  This is likely gout. At this time, please start taking naproxen 500mg  twice daily. You may continue to take this for one week. If no improvement of symptoms, please let us know.  I have ordered x-ray of your left great toe to evaluate for any other causes of pain.  Please follow up in 2 weeks to check uric acid levels once your acute flare is improved.   I will also follow up with Dr Theodis Shove regarding follow up.   If you have any questions or concerns, please call our clinic at [phone] between 9am-5pm and after hours call [phone] and ask for the internal medicine resident on call. If you feel you are having a medical emergency please call 911.   Thank you, we look forward to helping you remain healthy!

## 2021-06-08 NOTE — Assessment & Plan Note (Signed)
PHQ-9 score 11 (previously 9). Patient continues to be anxious on exam. She was referred to Dr Theodis Shove at previous visit. She is on xanax 0.25mg  qHS for sleep. She is interested in talk therapy.  Plan: F/u with Dr Theodis Shove

## 2021-06-08 NOTE — Assessment & Plan Note (Signed)
Patient reports left great toe pain for several months that has been progressively worsening over the past several days. Denies any trauma to the area. She notes that it is now difficult to walk and there is significant pain with even putting her shoe on. On examination, there is edema at the left great toe with mild erythema and significant tenderness to touch. No warmth appreciated.  Suspect her symptoms are secondary to gout.   Plan: Naproxen 500mg  bid x 7 days X-ray of left great toe F/u in few weeks following resolution of acute flare for uric acid levels at which point will need to start allopurinol

## 2021-06-08 NOTE — Progress Notes (Signed)
   CC: left great toe pain  HPI:  Melissa [name] is a 70 y.o. female with PMHx as stated below presenting for evaluation of left great toe pain for two months that has been progressively worsening. She notes that she now has difficulty walking. She denies any trauma. Please see problem based charting for complete assessment and plan.  Past Medical History:  Diagnosis Date   Allergy    Anxiety    Arthritis    s/p R TKR   Cyst of left kidney 2015   by lumbar MRI pending renal US   DDD (degenerative disc disease), lumbar 03/2014   severe L2-3 with L HNP with L2/3 nerve root impingement [name])   Depression    Depression with anxiety    Diabetes type 2, controlled ([place]) 2011   borderline   Dyspnea    Glaucoma    History of ulcer disease    HLD (hyperlipidemia)    no meds taken   Hypertension    Plantar fasciitis of left foot 10/01/2013   PONV (postoperative nausea and vomiting)    Right-sided face pain 01/07/2019   Ulcer    Review of Systems:  Negative except as stated in HPI.  Physical Exam:  Vitals:   06/08/21 1528  BP: 136/85  Pulse: 79  Resp: (!) 24  Temp: 98.4 F (36.9 C)  TempSrc: Oral  SpO2: 100%  Weight: 203 lb (92.1 kg)  Height: 5\' 5"  (1.651 m)   Physical Exam  Constitutional: Appears well-developed and well-nourished. No distress.  Musculoskeletal: Normal bulk and tone. No peripheral edema noted; left great toe edematous with mild erythema; significantly tender to touch; no warmth appreciated Neurological: Is alert and oriented x4, no apparent focal deficits noted. Skin: Warm and dry.  No rash, erythema, lesions noted. Psychiatric: Anxious mood. Behavior is normal. Judgment and thought content normal.    Assessment & Plan:   See Encounters Tab for problem based charting.  Patient discussed with Dr. Dareen Piano

## 2021-06-09 NOTE — Progress Notes (Signed)
Internal Medicine Clinic Attending ° °Case discussed with Dr. Aslam  At the time of the visit.  We reviewed the resident’s history and exam and pertinent patient test results.  I agree with the assessment, diagnosis, and plan of care documented in the resident’s note.  °

## 2021-06-15 ENCOUNTER — Ambulatory Visit: Payer: Medicare Other | Admitting: Behavioral Health

## 2021-06-15 DIAGNOSIS — F419 Anxiety disorder, unspecified: Secondary | ICD-10-CM

## 2021-06-15 DIAGNOSIS — F331 Major depressive disorder, recurrent, moderate: Secondary | ICD-10-CM

## 2021-06-15 NOTE — BH Specialist Note (Signed)
Integrated Behavioral Health via Telemedicine Visit  06/15/2021 Nillie CORAL TIMME [id number]  Number of [place] visits: 1/6 Session Start time: 9:00am  Session End time: 9:45am Total time: 43   Referring Provider: Dr. Lenise Herald, MD Patient/Family location: Pt is home in private [facility] Provider location: [facility] Office All persons participating in visit: Pt & Clinician Types of Service: Individual psychotherapy and Introduction only  I connected with Jaya Revonda Standard and/or Elidia F Flye's  self  via  Animal nutritionist  (Video is Tree surgeon) and verified that I am speaking with the correct person using two identifiers. Discussed confidentiality: Yes   I discussed the limitations of telemedicine and the availability of in person appointments.  Discussed there is a possibility of technology failure and discussed alternative modes of communication if that failure occurs.  I discussed that engaging in this telemedicine visit, they consent to the provision of behavioral healthcare and the services will be billed under their insurance.  Patient and/or legal guardian expressed understanding and consented to Telemedicine visit: Yes   Presenting Concerns: Patient and/or family reports the following symptoms/concerns: elevated anx due to personal health status changes, upcoming knee surgery, & Family issues that have thrown her equilibrium. Pt feels psychological overwhelm due to the chaos in the world. Pt has more recently exp'd episodes of excessive crying that have bothered her-promoting emot'l overwhelm as well. Duration of problem: 2022; Severity of problem: moderate; Pt sts the crying has really stopped, but she wanted to know if this was something to worry about.  Pt has been invld in reviewing her life thus far & is seeking a new purpose for her life in 2023. 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  Patient and/or  Family's Strengths/Protective Factors: Social connections, Social and Emotional competence, and Concrete supports in place (healthy food, safe environments, etc.)  Goals Addressed: Patient will:  Reduce symptoms of: anxiety, depression, stress, and issues w/normal aging.    Increase knowledge and/or ability of: coping skills and stress reduction for the anxiety life produces  Demonstrate ability to: Increase healthy adjustment to current life circumstances and Begin healthy grieving over loss  Progress towards Goals: Estb'd today; Pt will attend psychotherapy sessions via telehealth to address her concerns:  1-excessive talking 2-upcoming surgery 3-Cousin's issues  Interventions: Interventions utilized:  Supportive Counseling and Supportive Reflection Standardized Assessments completed:  screeners prn  Patient and/or Family Response: Pt receptive to call today & eager to meet again  Assessment: Patient currently experiencing elevated anx due to Family issues, health status changes, & the world situation. Pt exp'g psychological overwhelm that will be served by sessions of psychotherapy.  Pt is addressing issues of aging, anxiety, & health status changes that involve surgery & mobility.  Patient may benefit from cont'd support for life goals & estb'g new meaning for her life in 2023.  Plan: Follow up with behavioral health clinician on : 2-3 wks on telehealth for 60 min. Behavioral recommendations: Keep a notebook of ideas, problems, feelings, & thoughts btwn sessions to focus our visits. Referral(s): [place] (In Clinic)  Pt mailer sent w/instructions for [email or web address], note from Clinician reviewing the visit, & supportive materials for anxiety, encouragement, & validation/normalization of Pt concerns.  I discussed the assessment and treatment plan with the patient and/or parent/guardian. They were provided an opportunity to ask questions and  all were answered. They agreed with the plan and demonstrated an understanding of the instructions.  They were advised to call back or seek an in-person evaluation if the symptoms worsen or if the condition fails to improve as anticipated.  Donnetta Hutching, LMFT

## 2021-06-16 DIAGNOSIS — M47817 Spondylosis without myelopathy or radiculopathy, lumbosacral region: Secondary | ICD-10-CM | POA: Diagnosis not present

## 2021-06-16 DIAGNOSIS — M47816 Spondylosis without myelopathy or radiculopathy, lumbar region: Secondary | ICD-10-CM | POA: Diagnosis not present

## 2021-06-23 ENCOUNTER — Ambulatory Visit (INDEPENDENT_AMBULATORY_CARE_PROVIDER_SITE_OTHER): Payer: Medicare Other | Admitting: Allergy

## 2021-06-23 ENCOUNTER — Other Ambulatory Visit: Payer: Self-pay

## 2021-06-23 ENCOUNTER — Encounter: Payer: Self-pay | Admitting: Allergy

## 2021-06-23 VITALS — BP 130/76 | HR 82 | Temp 97.9°F | Resp 18 | Ht 65.0 in | Wt 204.1 lb

## 2021-06-23 DIAGNOSIS — H1013 Acute atopic conjunctivitis, bilateral: Secondary | ICD-10-CM | POA: Diagnosis not present

## 2021-06-23 DIAGNOSIS — L231 Allergic contact dermatitis due to adhesives: Secondary | ICD-10-CM

## 2021-06-23 DIAGNOSIS — J3089 Other allergic rhinitis: Secondary | ICD-10-CM | POA: Diagnosis not present

## 2021-06-23 MED ORDER — OLOPATADINE HCL 0.2 % OP SOLN: 1.0000 [drp] | Freq: Every day | OPHTHALMIC | 5 refills | Status: DC | PRN

## 2021-06-23 MED ORDER — TRIAMCINOLONE ACETONIDE 55 MCG/ACT NA AERO: 2.0000 | INHALATION_SPRAY | Freq: Every day | NASAL | 5 refills | Status: DC | PRN

## 2021-06-23 MED ORDER — EUCRISA 2 % EX OINT: 1.0000 "application " | TOPICAL_OINTMENT | Freq: Every day | CUTANEOUS | 5 refills | Status: DC | PRN

## 2021-06-23 MED ORDER — LEVOCETIRIZINE DIHYDROCHLORIDE 5 MG PO TABS: 5.0000 mg | ORAL_TABLET | Freq: Every evening | ORAL | 5 refills | Status: DC

## 2021-06-23 NOTE — Patient Instructions (Addendum)
Allergic rhinitis with conjunctivitis - environmental allergy testing is positive to grasses, weeds, trees, mold and cat - allergen avoidance measures discussed/handouts provided - recommend use of Xyzal 5mg  daily as needed.  This is an long-acting antihistamine to use to replace Benadryl use.  This should not make you drowsy - for nasal congestion/drainage can use Nasacort or Flonase 2 sprays each nostril daily for 1-2 weeks at a time before stopping once nasal congestion improves for maximum benefit - for itchy/watery eyes can use Pataday 1 drop each eye daily as needed   Contact dermatitis - you have contact allergy to use of dermabond - would not use this product again.  Your surgeon will need to use a different closing option for incisions - you can use eucrisa samples on the area to help with itching.  Use eucrisa twice a day as needed for itch   Follow-up in 4-6 months or sooner if needed

## 2021-06-23 NOTE — Progress Notes (Signed)
New Patient Note  RE: Melissa [name] MRN: [id number] DOB: Oct 26, 1950 Date of Office Visit: 06/23/2021  Primary care provider: Angelica Pou, MD  Chief Complaint: Seasonal allergies  History of present illness: Melissa [name] is a 70 y.o. female presenting today for evaluation of allergic rhinitis.  She states her seasonal allergies seem to be worsening for years now.  She states when it rains she starts to sneeze and have runny nose.  She states symptoms are worse when the leaves drop.  She also reports itchy/watery eyes if she has a "bad allergy attack".  When she has a bad allergy attack she also states can feel like she is wheezing.  She will take benadryl as needed.  She has also taken mucinex.  She states claritin does not work for her when she took it years ago.   She has no history of asthma or need/use for breathing treatments.    She had shoulder surgery on 02/16/21 and states she had a reaction to dermabond glue.  She states area where the glue was applied was "red and inflamed".  She states she could not wear her sling because it felt like it was "on fire".  She states the incision still feels like she has "ants" in it and feels itchy still.  She states the surgery itself went well but the skin reaction she has had is still bothersome.  She does have pictures of the area and states it did develop blistering and was purulent. She has had 5 orthopedic surgeries and states her shoulder surgery was the first time dermabond had been used.   Review of systems: Review of Systems  Constitutional: Negative.   HENT:  Positive for congestion.        See HPI  Eyes:        See HPI  Respiratory: Negative.    Cardiovascular: Negative.   Gastrointestinal: Negative.   Musculoskeletal: Negative.   Skin:  Positive for itching. Negative for rash.  Neurological: Negative.    All other systems negative unless noted above in HPI  Past medical history: Past Medical History:   Diagnosis Date   Allergy    Anxiety    Arthritis    s/p R TKR   Cyst of left kidney 2015   by lumbar MRI pending renal US   DDD (degenerative disc disease), lumbar 03/2014   severe L2-3 with L HNP with L2/3 nerve root impingement [name])   Depression    Depression with anxiety    Diabetes type 2, controlled ([facility]) 2011   borderline   Dyspnea    Glaucoma    History of ulcer disease    HLD (hyperlipidemia)    no meds taken   Hypertension    Plantar fasciitis of left foot 10/01/2013   PONV (postoperative nausea and vomiting)    Right-sided face pain 01/07/2019   Ulcer     Past surgical history: Past Surgical History:  Procedure Laterality Date   ABDOMINAL HYSTERECTOMY  2007   fibroids, heavy bleeding   ARTERY BIOPSY Right 01/23/2019   Procedure: REMOVAL OF PART OF RIGHT TEMPORAL ARTERY FOR BIOPSY;  Surgeon: Michael Boston, MD;  Location: [place];  Service: General;  Laterality: Right;   CATARACT EXTRACTION W/ INTRAOCULAR LENS IMPLANT Bilateral    COLONOSCOPY  04/2017   TA, diverticulosis, rpt ? ([facility])   GLAUCOMA SURGERY     HAMMER TOE SURGERY Bilateral    Right hip replacement  TONSILLECTOMY     TOTAL KNEE ARTHROPLASTY Right 2004   TKR (Dr. Eddie Dibbles with [facility])   TOTAL KNEE REVISION Right 03/2014   Dr Al Corpus [facility]    Family history:  Family History  Problem Relation Age of Onset   Cancer Maternal Grandmother 50       ovarian   Diabetes Maternal Grandmother    Hypertension Maternal Grandmother    Cancer Mother 30       bone, MM   Stroke Other        unsure who   CAD Neg Hx    Colon cancer Neg Hx    Esophageal cancer Neg Hx    Rectal cancer Neg Hx    Stomach cancer Neg Hx     Social history: Lives in a home with carpeting with gas heating and central and window cooling.  No pets in the home.  The neighbors have cats or dogs.  There is no concern for water damage, mildew or roaches in the home.  She is retired.  She denies a smoking history.  [place]   Medication List: Current Outpatient Medications  Medication Sig Dispense Refill   Accu-Chek FastClix Lancets MISC USE TO TEST BLOOD SUGAR UP TO FOUR TIMES DAILY 306 each 1   ACCU-CHEK GUIDE test strip USE AS DIRECTED FOUR TIMES DAILY 100 strip 11   acetaminophen (TYLENOL) 500 MG tablet Take 500 mg by mouth every 6 (six) hours as needed for moderate pain or headache.     ALPRAZolam (XANAX) 0.25 MG tablet TAKE 1 TABLET(0.25 MG) BY MOUTH AT BEDTIME AS NEEDED FOR SLEEP 30 tablet 5   blood glucose meter kit and supplies KIT Dispense based on patient and insurance preference. Use up to four times daily as directed. DX Code: E11.9 1 each 0   dorzolamide-timolol (COSOPT) 22.3-6.8 MG/ML ophthalmic solution Place 1 drop into both eyes 2 (two) times daily. 10 mL 1   hydrochlorothiazide (HYDRODIURIL) 25 MG tablet Take 1 tablet (25 mg total) by mouth daily. 30 tablet 0   lisinopril (ZESTRIL) 20 MG tablet Take 1 tablet (20 mg total) by mouth daily. 90 tablet 3   MULTIPLE VITAMIN PO Take 1 tablet by mouth daily.     naproxen (NAPROSYN) 500 MG tablet Take 1 tablet (500 mg total) by mouth 2 (two) times daily with a meal for 60 doses. 60 tablet 0   omeprazole (PRILOSEC) 20 MG capsule Take 20 mg by mouth daily as needed (heartburn).     TRULICITY 1.5 IH/0.3UU SOPN Inject 1.5 mg into the skin once a week.     No current facility-administered medications for this visit.    Known medication allergies: Allergies  Allergen Reactions   Codeine Other (See Comments)    palpitations   Metformin And Related Nausea Only   Oxycodone Nausea And Vomiting     Physical examination: Blood pressure 130/76, pulse 82, temperature 97.9 F (36.6 C), temperature source Temporal, resp. rate 18, height 5' 5"  (1.651 m), weight 204 lb 2 oz (92.6 kg), SpO2 98 %.  General: Alert, interactive, in no acute distress. HEENT: PERRLA, TMs pearly gray, turbinates mildly edematous without discharge, post-pharynx  non erythematous. Neck: Supple without lymphadenopathy. Lungs: Clear to auscultation without wheezing, rhonchi or rales. {no increased work of breathing. CV: Normal S1, S2 without murmurs. Abdomen: Nondistended, nontender. Skin: Right shoulder with large linear healed incision with hyperpigmentation surrounding the incision . Extremities:  No clubbing, cyanosis or  edema. Neuro:   Grossly intact.  Diagnositics/Labs Allergy testing: Environmental allergy skin prick testing is positive to [place], [employer], marsh elder, hickory, maple, pecan, sycamore, Fusarium, pullulara and cat Allergy testing results were read and interpreted by provider, documented by clinical staff.   Assessment and plan:   Allergic rhinitis with conjunctivitis - environmental allergy testing is positive to grasses, weeds, trees, mold and cat - allergen avoidance measures discussed/handouts provided - recommend use of Xyzal 96m daily as needed.  This is an long-acting antihistamine to use to replace Benadryl use.  This should not make you drowsy - for nasal congestion/drainage can use Nasacort or Flonase 2 sprays each nostril daily for 1-2 weeks at a time before stopping once nasal congestion improves for maximum benefit - for itchy/watery eyes can use Pataday 1 drop each eye daily as needed   Contact dermatitis - you have contact allergy to use of dermabond - would not use this product again.  Your surgeon will need to use a different closing option for incisions - you can use eucrisa samples on the area to help with itching.  Use eucrisa twice a day as needed for itch   Follow-up in 4-6 months or sooner if needed  I appreciate the opportunity to take part in Jessyca's care. Please do not hesitate to contact me with questions.  Sincerely,   SPrudy Feeler MD Allergy/Immunology [facility]

## 2021-06-24 ENCOUNTER — Telehealth: Payer: Self-pay | Admitting: *Deleted

## 2021-06-24 NOTE — Addendum Note (Signed)
Addended by: Jacqualin Combes on: 06/24/2021 09:54 AM   Modules accepted: Orders

## 2021-06-24 NOTE — Telephone Encounter (Signed)
Received a fax from the pharmacy stating that Melissa [name] was not covered. It did stated that preferred alternatives are Betamethasone dipropionate, betamethasone valerate, clobetasol, cluocinolone, halobetasol, mometasone, and triamcinolone were preferred alternatives. Would it be ok to switch to a preferred medication?

## 2021-06-28 NOTE — Telephone Encounter (Signed)
Will initiate Prior Authorization for Eucrisa and go from there.

## 2021-06-28 NOTE — Telephone Encounter (Signed)
PA has been submitted through CoverMyMeds for Eucrisa and is currently pending approval/denial.  

## 2021-06-29 ENCOUNTER — Encounter: Payer: Self-pay | Admitting: Internal Medicine

## 2021-07-01 NOTE — Telephone Encounter (Signed)
Received a denial from insurance stating that Melissa [name] is not FDA approved for contact dermatitis due to exposure to adhesives. Is there a different diagnosis we can associate it with?

## 2021-07-04 ENCOUNTER — Ambulatory Visit: Payer: Medicare Other | Admitting: Internal Medicine

## 2021-07-05 ENCOUNTER — Other Ambulatory Visit: Payer: Self-pay | Admitting: *Deleted

## 2021-07-05 MED ORDER — DESONIDE 0.05 % EX OINT: 1.0000 "application " | TOPICAL_OINTMENT | Freq: Two times a day (BID) | CUTANEOUS | 5 refills | Status: DC | PRN

## 2021-07-05 NOTE — Telephone Encounter (Signed)
Prescription for desonide has been sent in. Called patient and advised of change in ointment. Patient verbalized understanding.

## 2021-07-06 ENCOUNTER — Telehealth: Payer: Self-pay

## 2021-07-06 ENCOUNTER — Ambulatory Visit: Payer: Medicare Other | Admitting: Behavioral Health

## 2021-07-06 DIAGNOSIS — F419 Anxiety disorder, unspecified: Secondary | ICD-10-CM

## 2021-07-06 DIAGNOSIS — F331 Major depressive disorder, recurrent, moderate: Secondary | ICD-10-CM

## 2021-07-06 NOTE — Telephone Encounter (Signed)
Hi Dr. Jimmye Norman, The patient is requesting Hydroquinone bleaching cream.  Please see her message from 10/26 regarding this RX request. Thank you, SChaplin, RN,BSN

## 2021-07-06 NOTE — Progress Notes (Signed)
Surgical Instructions    Your procedure is scheduled on 07/11/21.  Report to [facility] Main Entrance "A" at 10:05 A.M., then check in with the Admitting office.  Call this number if you have problems the morning of surgery:  [phone]   If you have any questions prior to your surgery date call [phone]: Open Monday-Friday 8am-4pm    Remember:  Do not eat after midnight the night before your surgery  You may drink clear liquids until 9:00 the morning of your surgery.   Clear liquids allowed are: Water, Non-Citrus Juices (without pulp), Carbonated Beverages, Clear Tea, Black Coffee ONLY (NO MILK, CREAM OR POWDERED CREAMER of any kind), and Gatorade  Please complete your PRE-SURGERY Gatorade that was provided to you by 9:00 am the morning of surgery.  Please, if able, drink it in one setting. DO NOT SIP.     Take these medicines the morning of surgery with A SIP OF WATER:  AS NEEDED: acetaminophen (TYLENOL) cycloSPORINE (RESTASIS) omeprazole (PRILOSEC)    As of today, STOP taking any Aspirin (unless otherwise instructed by your surgeon) Aleve, Naproxen, Ibuprofen, Motrin, Advil, Goody's, BC's, all herbal medications, fish oil, and all vitamins.  WHAT DO I DO ABOUT MY DIABETES MEDICATION?   Do not take oral diabetes medicines (pills) the morning of surgery.   HOW TO MANAGE YOUR DIABETES BEFORE AND AFTER SURGERY  Why is it important to control my blood sugar before and after surgery? Improving blood sugar levels before and after surgery helps healing and can limit problems. A way of improving blood sugar control is eating a healthy diet by:  Eating less sugar and carbohydrates  Increasing activity/exercise  Talking with your doctor about reaching your blood sugar goals High blood sugars (greater than 180 mg/dL) can raise your risk of infections and slow your recovery, so you will need to focus on controlling your diabetes during the weeks before surgery. Make sure that  the doctor who takes care of your diabetes knows about your planned surgery including the date and location.  How do I manage my blood sugar before surgery? Check your blood sugar at least 4 times a day, starting 2 days before surgery, to make sure that the level is not too high or low.  Check your blood sugar the morning of your surgery when you wake up and every 2 hours until you get to the Short Stay unit.  If your blood sugar is less than 70 mg/dL, you will need to treat for low blood sugar: Do not take insulin. Treat a low blood sugar (less than 70 mg/dL) with  cup of clear juice (cranberry or apple), 4 glucose tablets, OR glucose gel. Recheck blood sugar in 15 minutes after treatment (to make sure it is greater than 70 mg/dL). If your blood sugar is not greater than 70 mg/dL on recheck, call [phone] for further instructions. Report your blood sugar to the short stay nurse when you get to Short Stay.  If you are admitted to the hospital after surgery: Your blood sugar will be checked by the staff and you will probably be given insulin after surgery (instead of oral diabetes medicines) to make sure you have good blood sugar levels. The goal for blood sugar control after surgery is 80-180 mg/dL.      After your COVID test   You are not required to quarantine however you are required to wear a well-fitting mask when you are out and around people not in your household.  If your mask becomes wet or soiled, replace with a new one.  Wash your hands often with soap and water for 20 seconds or clean your hands with an alcohol-based hand sanitizer that contains at least 60% alcohol.  Do not share personal items.  Notify your provider: if you are in close contact with someone who has COVID  or if you develop a fever of 100.4 or greater, sneezing, cough, sore throat, shortness of breath or body aches.             Do not wear jewelry or makeup Do not wear lotions, powders, perfumes  or deodorant. Do not shave 48 hours prior to surgery.   Do not bring valuables to the hospital. DO Not wear nail polish, gel polish, artificial nails, or any other type of covering on natural nails including finger and toenails. If patients have artificial nails, gel coating, etc. that need to be removed by a nail salon, please have this removed prior to surgery or surgery may need to be canceled/delayed if the surgeon/ anesthesia feels like the patient is unable to be adequately monitored.             [facility] is not responsible for any belongings or valuables.  Do NOT Smoke (Tobacco/Vaping)  24 hours prior to your procedure  If you use a CPAP at night, you may bring your mask for your overnight stay.   Contacts, glasses, hearing aids, dentures or partials may not be worn into surgery, please bring cases for these belongings   For patients admitted to the hospital, discharge time will be determined by your treatment team.   Patients discharged the day of surgery will not be allowed to drive home, and someone needs to stay with them for 24 hours.  NO VISITORS WILL BE ALLOWED IN PRE-OP WHERE PATIENTS ARE PREPPED FOR SURGERY.  ONLY 1 SUPPORT PERSON MAY BE PRESENT IN THE WAITING ROOM WHILE YOU ARE IN SURGERY.  IF YOU ARE TO BE ADMITTED, ONCE YOU ARE IN YOUR ROOM YOU WILL BE ALLOWED TWO (2) VISITORS. 1 (ONE) VISITOR MAY STAY OVERNIGHT BUT MUST ARRIVE TO THE ROOM BY 8pm.  Minor children may have two parents present. Special consideration for safety and communication needs will be reviewed on a case by case basis.  Special instructions:    Oral Hygiene is also important to reduce your risk of infection.  Remember - BRUSH YOUR TEETH THE MORNING OF SURGERY WITH YOUR REGULAR TOOTHPASTE   [place]- Preparing For Surgery  Before surgery, you can play an important role. Because skin is not sterile, your skin needs to be as free of germs as possible. You can reduce the number of germs on your  skin by washing with CHG (chlorahexidine gluconate) Soap before surgery.  CHG is an antiseptic cleaner which kills germs and bonds with the skin to continue killing germs even after washing.     Please do not use if you have an allergy to CHG or antibacterial soaps. If your skin becomes reddened/irritated stop using the CHG.  Do not shave (including legs and underarms) for at least 48 hours prior to first CHG shower. It is OK to shave your face.  Please follow these instructions carefully.     Shower the NIGHT BEFORE SURGERY and the MORNING OF SURGERY with CHG Soap.   If you chose to wash your hair, wash your hair first as usual with your normal shampoo. After you shampoo, rinse your hair and  body thoroughly to remove the shampoo.  Then [employer] and genitals (private parts) with your normal soap and rinse thoroughly to remove soap.  After that Use CHG Soap as you would any other liquid soap. You can apply CHG directly to the skin and wash gently with a scrungie or a clean washcloth.   Apply the CHG Soap to your body ONLY FROM THE NECK DOWN.  Do not use on open wounds or open sores. Avoid contact with your eyes, ears, mouth and genitals (private parts). Wash Face and genitals (private parts)  with your normal soap.   Wash thoroughly, paying special attention to the area where your surgery will be performed.  Thoroughly rinse your body with warm water from the neck down.  DO NOT shower/wash with your normal soap after using and rinsing off the CHG Soap.  Pat yourself dry with a CLEAN TOWEL.  Wear CLEAN PAJAMAS to bed the night before surgery  Place CLEAN SHEETS on your bed the night before your surgery  DO NOT SLEEP WITH PETS.   Day of Surgery:  Take a shower with CHG soap. Wear Clean/Comfortable clothing the morning of surgery Do not apply any deodorants/lotions.   Remember to brush your teeth WITH YOUR REGULAR TOOTHPASTE.   Please read over the following fact sheets that you  were given.

## 2021-07-06 NOTE — BH Specialist Note (Signed)
Integrated Behavioral Health via Telemedicine Visit  07/06/2021 Melissa [name] [id number]  Number of [place] visits: 2/6 Session Start time: 1:00pm  Session End time: 1:30pm Total time: 30  Referring Provider: Dr. Lenise Herald, MD Patient/Family location: Pt is in private @ home [facility] Provider location: [facility] Office All persons participating in visit: Pt & Clinician Types of Service: Individual psychotherapy  I connected with Melissa [name] and/or Melissa [name]'s  self  via  Animal nutritionist  (Video is Tree surgeon) and verified that I am speaking with the correct person using two identifiers. Discussed confidentiality:  2nd visit  I discussed the limitations of telemedicine and the availability of in person appointments.  Discussed there is a possibility of technology failure and discussed alternative modes of communication if that failure occurs.  I discussed that engaging in this telemedicine visit, they consent to the provision of behavioral healthcare and the services will be billed under their insurance.  Patient and/or legal guardian expressed understanding and consented to Telemedicine visit:  2nd visit  Presenting Concerns: Patient and/or family reports the following symptoms/concerns: elevated anx/dep due to recent Family situation, her new employment plans for 2023.  Duration of problem: months; Severity of problem: moderate  Patient and/or Family's Strengths/Protective Factors: Social connections, Concrete supports in place (healthy food, safe environments, etc.), and Sense of purpose  Goals Addressed: Patient will:  Reduce symptoms of: anxiety, depression, and emot'l fallout from Cousin's story malingered about preg & twins which was false.    Increase knowledge and/or ability of: coping skills and stress reduction   Demonstrate ability to: Increase healthy adjustment to current life  circumstances  Progress towards Goals: Ongoing  Interventions: Interventions utilized:  Supportive Counseling and healthy boundaries w/Family Standardized Assessments completed:  screeners prn  Patient and/or Family Response: Pt receptive to call & requests future appt  Assessment: Patient currently experiencing elvated anx/dep due to situational stressors & Family circumstances.   Patient may benefit from keeping healthy boundaries w/her Family members.  Plan: Follow up with behavioral health clinician on : 2-3 wks on telehealth for 30 min Behavioral recommendations: Keep your Notebook & work on [employer]): [place] (In Clinic) & the book about boundaries by [employer]  I discussed the assessment and treatment plan with the patient and/or parent/guardian. They were provided an opportunity to ask questions and all were answered. They agreed with the plan and demonstrated an understanding of the instructions.   They were advised to call back or seek an in-person evaluation if the symptoms worsen or if the condition fails to improve as anticipated.  Melissa Hutching, LMFT

## 2021-07-06 NOTE — Telephone Encounter (Signed)
Refill request on Hydroquinone level 3 @ [facility] [place].

## 2021-07-06 NOTE — Telephone Encounter (Signed)
Requesting to speak with a nurse about getting refill. Pt do not know the name of the medication. Please call back.

## 2021-07-07 ENCOUNTER — Other Ambulatory Visit: Payer: Self-pay | Admitting: Internal Medicine

## 2021-07-07 ENCOUNTER — Encounter (HOSPITAL_COMMUNITY)
Admission: RE | Admit: 2021-07-07 | Discharge: 2021-07-07 | Disposition: A | Discharge status: Home / Self Care | Payer: Medicare Other | Source: Ambulatory Visit | Attending: Orthopaedic Surgery | Admitting: Orthopaedic Surgery

## 2021-07-07 ENCOUNTER — Other Ambulatory Visit: Payer: Self-pay

## 2021-07-07 ENCOUNTER — Encounter (HOSPITAL_COMMUNITY): Payer: Self-pay

## 2021-07-07 VITALS — BP 127/80 | HR 78 | Temp 98.4°F | Resp 18 | Ht 65.0 in | Wt 199.3 lb

## 2021-07-07 DIAGNOSIS — Z01818 Encounter for other preprocedural examination: Secondary | ICD-10-CM | POA: Diagnosis present

## 2021-07-07 DIAGNOSIS — Z20822 Contact with and (suspected) exposure to covid-19: Secondary | ICD-10-CM | POA: Insufficient documentation

## 2021-07-07 DIAGNOSIS — L819 Disorder of pigmentation, unspecified: Secondary | ICD-10-CM

## 2021-07-07 LAB — URINALYSIS, ROUTINE W REFLEX MICROSCOPIC
Bilirubin Urine: NEGATIVE
Glucose, UA: NEGATIVE mg/dL
Hgb urine dipstick: NEGATIVE
Ketones, ur: NEGATIVE mg/dL
Nitrite: NEGATIVE
Protein, ur: NEGATIVE mg/dL
Specific Gravity, Urine: 1.02 (ref 1.005–1.030)
pH: 5 (ref 5.0–8.0)

## 2021-07-07 LAB — CBC WITH DIFFERENTIAL/PLATELET
Abs Immature Granulocytes: 0.01 10*3/uL (ref 0.00–0.07)
Basophils Absolute: 0 10*3/uL (ref 0.0–0.1)
Basophils Relative: 0 %
Eosinophils Absolute: 0.2 10*3/uL (ref 0.0–0.5)
Eosinophils Relative: 4 %
HCT: 43.5 % (ref 36.0–46.0)
Hemoglobin: 13.2 g/dL (ref 12.0–15.0)
Immature Granulocytes: 0 %
Lymphocytes Relative: 33 %
Lymphs Abs: 1.5 10*3/uL (ref 0.7–4.0)
MCH: 26.6 pg (ref 26.0–34.0)
MCHC: 30.3 g/dL (ref 30.0–36.0)
MCV: 87.5 fL (ref 80.0–100.0)
Monocytes Absolute: 0.3 10*3/uL (ref 0.1–1.0)
Monocytes Relative: 6 %
Neutro Abs: 2.6 10*3/uL (ref 1.7–7.7)
Neutrophils Relative %: 57 %
Platelets: 343 10*3/uL (ref 150–400)
RBC: 4.97 MIL/uL (ref 3.87–5.11)
RDW: 15.4 % (ref 11.5–15.5)
WBC: 4.6 10*3/uL (ref 4.0–10.5)
nRBC: 0 % (ref 0.0–0.2)

## 2021-07-07 LAB — COMPREHENSIVE METABOLIC PANEL
ALT: 16 U/L (ref 0–44)
AST: 18 U/L (ref 15–41)
Albumin: 3.9 g/dL (ref 3.5–5.0)
Alkaline Phosphatase: 100 U/L (ref 38–126)
Anion gap: 11 (ref 5–15)
BUN: 11 mg/dL (ref 8–23)
CO2: 24 mmol/L (ref 22–32)
Calcium: 9.7 mg/dL (ref 8.9–10.3)
Chloride: 105 mmol/L (ref 98–111)
Creatinine, Ser: 0.82 mg/dL (ref 0.44–1.00)
GFR, Estimated: >60 mL/min (ref >60)
Glucose, Bld: 84 mg/dL (ref 70–99)
Potassium: 3.6 mmol/L (ref 3.5–5.1)
Sodium: 140 mmol/L (ref 135–145)
Total Bilirubin: 0.4 mg/dL (ref 0.3–1.2)
Total Protein: 8 g/dL (ref 6.5–8.1)

## 2021-07-07 LAB — GLUCOSE, CAPILLARY: Glucose-Capillary: 90 mg/dL (ref 70–99)

## 2021-07-07 LAB — PROTIME-INR
INR: 1 (ref 0.8–1.2)
Prothrombin Time: 13.2 seconds (ref 11.4–15.2)

## 2021-07-07 LAB — APTT: aPTT: 35 seconds (ref 24–36)

## 2021-07-07 LAB — SARS CORONAVIRUS 2 (TAT 6-24 HRS): SARS Coronavirus 2: NEGATIVE

## 2021-07-07 LAB — SURGICAL PCR SCREEN
MRSA, PCR: NEGATIVE
Staphylococcus aureus: NEGATIVE

## 2021-07-07 MED ORDER — HYDROQUINONE 4 % EX CREA: TOPICAL_CREAM | CUTANEOUS | 3 refills | Status: DC

## 2021-07-07 NOTE — Progress Notes (Signed)
PCP - Dr. Angelica Pou Cardiologist - denies  PPM/ICD - denies  Chest x-ray - 11/21/14 EKG - 07/07/21 at PAT appt Stress Test - 11/04/13 ECHO - 11/03/13 Cardiac Cath - denies  Sleep Study - March 2022, pt was diagnosed with OSA but she is waiting to receive her CPAP.  DM- Type 2 Fasting Blood Sugar - 70-80 Checks Blood Sugar once or twice a week  Blood Thinner Instructions: n/a Aspirin Instructions: n/a  ERAS Protcol - yes PRE-SURGERY G2- given  COVID TEST- 07/07/21 at PAT appt   Anesthesia review: no  Patient denies shortness of breath, fever, cough and chest pain at PAT appointment   All instructions explained to the patient, with a verbal understanding of the material. Patient agrees to go over the instructions while at home for a better understanding. Patient also instructed to wear a mask in public after being tested for COVID-19. The opportunity to ask questions was provided.

## 2021-07-07 NOTE — Progress Notes (Signed)
Prescription formerly prescribed by derm Dr. Towanda Malkin who is deceased.

## 2021-07-07 NOTE — Progress Notes (Signed)
Can you let me know if she has any urinary symptoms such as frequency, urgency, burning, etc?  If so, I will need to send in an antibiotic

## 2021-07-08 ENCOUNTER — Telehealth: Payer: Self-pay | Admitting: *Deleted

## 2021-07-08 ENCOUNTER — Other Ambulatory Visit: Payer: Self-pay | Admitting: Physician Assistant

## 2021-07-08 ENCOUNTER — Telehealth: Payer: Self-pay

## 2021-07-08 MED ORDER — CIPROFLOXACIN HCL 500 MG PO TABS: 500.0000 mg | ORAL_TABLET | Freq: Two times a day (BID) | ORAL | 0 refills | Status: AC

## 2021-07-08 MED ORDER — ONDANSETRON HCL 4 MG PO TABS: 4.0000 mg | ORAL_TABLET | Freq: Three times a day (TID) | ORAL | 0 refills | Status: DC | PRN

## 2021-07-08 MED ORDER — TRANEXAMIC ACID 1000 MG/10ML IV SOLN
2000.0000 mg | INTRAVENOUS | Status: DC
  Filled 2021-07-08: qty 20

## 2021-07-08 MED ORDER — HYDROCODONE-ACETAMINOPHEN 7.5-325 MG PO TABS: 1.0000 | ORAL_TABLET | Freq: Four times a day (QID) | ORAL | 0 refills | Status: DC | PRN

## 2021-07-08 MED ORDER — SULFAMETHOXAZOLE-TRIMETHOPRIM 800-160 MG PO TABS: 1.0000 | ORAL_TABLET | Freq: Two times a day (BID) | ORAL | 0 refills | Status: DC

## 2021-07-08 MED ORDER — DOCUSATE SODIUM 100 MG PO CAPS: 100.0000 mg | ORAL_CAPSULE | Freq: Every day | ORAL | 2 refills | Status: DC | PRN

## 2021-07-08 MED ORDER — ASPIRIN EC 81 MG PO TBEC: 81.0000 mg | DELAYED_RELEASE_TABLET | Freq: Two times a day (BID) | ORAL | 0 refills | Status: DC

## 2021-07-08 MED ORDER — METHOCARBAMOL 500 MG PO TABS: 500.0000 mg | ORAL_TABLET | Freq: Two times a day (BID) | ORAL | 2 refills | Status: DC | PRN

## 2021-07-08 NOTE — Telephone Encounter (Signed)
Melissa [name], RMA Can you call asap and have her start the cipro today.  It is twice a day. I have called in bactrim to take after surgery so make sure she does not take this before     Called patient no answer LMOM. She need to advise on message.

## 2021-07-08 NOTE — Care Plan (Signed)
RNCM call to patient to discuss her upcoming Left total knee arthroplasty with Dr. Erlinda Hong. She is an ortho bundle patient through [facility] and is agreeable to case management. She lives alone, but will have a brother and neighbor helping her as needed. She has a Environmental consultant, but no wheels on it. [employer] delivering her CPM, and most likely a RW today. Anticipate HHPT will be needed after short hospital stay. Referral for [facility] after choice provided. Reviewed all post op care instructions. Will continue to follow for needs.

## 2021-07-08 NOTE — Telephone Encounter (Signed)
Ortho bundle Pre-op call attempted. Left VM requesting call back.

## 2021-07-08 NOTE — Telephone Encounter (Signed)
Ortho bundle Pre-op call completed. 

## 2021-07-08 NOTE — Progress Notes (Signed)
Can you call asap and have her start the cipro today.  It is twice a day. I have called in bactrim to take after surgery so make sure she does not take this before

## 2021-07-08 NOTE — Progress Notes (Signed)
Patient voiced understanding of new arrival time of 0745 on Monday 07/11/2021

## 2021-07-08 NOTE — Telephone Encounter (Signed)
Sheri T advised.

## 2021-07-11 ENCOUNTER — Observation Stay (HOSPITAL_COMMUNITY): Payer: Medicare Other

## 2021-07-11 ENCOUNTER — Ambulatory Visit (HOSPITAL_COMMUNITY): Payer: Medicare Other | Admitting: Vascular Surgery

## 2021-07-11 ENCOUNTER — Ambulatory Visit (HOSPITAL_COMMUNITY): Payer: Medicare Other | Admitting: Anesthesiology

## 2021-07-11 ENCOUNTER — Observation Stay (HOSPITAL_COMMUNITY)
Admission: RE | Admit: 2021-07-11 | Discharge: 2021-07-13 | Disposition: A | Discharge status: Home / Self Care | Payer: Medicare Other | Source: Ambulatory Visit | Attending: Orthopaedic Surgery | Admitting: Orthopaedic Surgery

## 2021-07-11 ENCOUNTER — Encounter (HOSPITAL_COMMUNITY)
Admission: RE | Disposition: A | Discharge status: Home / Self Care | Payer: Self-pay | Source: Ambulatory Visit | Attending: Orthopaedic Surgery

## 2021-07-11 ENCOUNTER — Encounter (HOSPITAL_COMMUNITY): Payer: Self-pay | Admitting: Orthopaedic Surgery

## 2021-07-11 ENCOUNTER — Other Ambulatory Visit: Payer: Self-pay

## 2021-07-11 DIAGNOSIS — M1712 Unilateral primary osteoarthritis, left knee: Secondary | ICD-10-CM

## 2021-07-11 DIAGNOSIS — Z96653 Presence of artificial knee joint, bilateral: Secondary | ICD-10-CM

## 2021-07-11 DIAGNOSIS — Z79899 Other long term (current) drug therapy: Secondary | ICD-10-CM | POA: Insufficient documentation

## 2021-07-11 DIAGNOSIS — E119 Type 2 diabetes mellitus without complications: Secondary | ICD-10-CM | POA: Diagnosis not present

## 2021-07-11 DIAGNOSIS — Z96652 Presence of left artificial knee joint: Secondary | ICD-10-CM

## 2021-07-11 DIAGNOSIS — R52 Pain, unspecified: Secondary | ICD-10-CM

## 2021-07-11 DIAGNOSIS — I10 Essential (primary) hypertension: Secondary | ICD-10-CM | POA: Insufficient documentation

## 2021-07-11 DIAGNOSIS — I959 Hypotension, unspecified: Secondary | ICD-10-CM | POA: Diagnosis not present

## 2021-07-11 DIAGNOSIS — M179 Osteoarthritis of knee, unspecified: Secondary | ICD-10-CM | POA: Diagnosis present

## 2021-07-11 HISTORY — PX: TOTAL KNEE ARTHROPLASTY: SHX125

## 2021-07-11 HISTORY — DX: Unilateral primary osteoarthritis, left knee: M17.12

## 2021-07-11 LAB — TSH: TSH: 3.469 u[IU]/mL (ref 0.350–4.500)

## 2021-07-11 LAB — COMPREHENSIVE METABOLIC PANEL
ALT: 15 U/L (ref 0–44)
AST: 25 U/L (ref 15–41)
Albumin: 2.9 g/dL — ABNORMAL LOW (ref 3.5–5.0)
Alkaline Phosphatase: 83 U/L (ref 38–126)
Anion gap: 12 (ref 5–15)
BUN: 18 mg/dL (ref 8–23)
CO2: 20 mmol/L — ABNORMAL LOW (ref 22–32)
Calcium: 8.5 mg/dL — ABNORMAL LOW (ref 8.9–10.3)
Chloride: 104 mmol/L (ref 98–111)
Creatinine, Ser: 1.04 mg/dL — ABNORMAL HIGH (ref 0.44–1.00)
GFR, Estimated: 58 mL/min — ABNORMAL LOW (ref >60)
Glucose, Bld: 235 mg/dL — ABNORMAL HIGH (ref 70–99)
Potassium: 3.6 mmol/L (ref 3.5–5.1)
Sodium: 136 mmol/L (ref 135–145)
Total Bilirubin: 0.6 mg/dL (ref 0.3–1.2)
Total Protein: 6 g/dL — ABNORMAL LOW (ref 6.5–8.1)

## 2021-07-11 LAB — GLUCOSE, CAPILLARY
Glucose-Capillary: 83 mg/dL (ref 70–99)
Glucose-Capillary: 184 mg/dL — ABNORMAL HIGH (ref 70–99)
Glucose-Capillary: 167 mg/dL — ABNORMAL HIGH (ref 70–99)
Glucose-Capillary: 179 mg/dL — ABNORMAL HIGH (ref 70–99)

## 2021-07-11 LAB — CORTISOL: Cortisol, Plasma: >100 ug/dL

## 2021-07-11 LAB — HEMOGLOBIN A1C
Hgb A1c MFr Bld: 5.5 % (ref 4.8–5.6)
Mean Plasma Glucose: 111.15 mg/dL

## 2021-07-11 LAB — TROPONIN I (HIGH SENSITIVITY)
Troponin I (High Sensitivity): 35 ng/L — ABNORMAL HIGH (ref <18)
Troponin I (High Sensitivity): 205 ng/L (ref <18)

## 2021-07-11 SURGERY — ARTHROPLASTY, KNEE, TOTAL
Anesthesia: Regional | Site: Knee | Laterality: Left

## 2021-07-11 MED ORDER — ACETAMINOPHEN 325 MG PO TABS: 325.0000 mg | ORAL_TABLET | Freq: Four times a day (QID) | ORAL | Status: DC | PRN

## 2021-07-11 MED ORDER — 0.9 % SODIUM CHLORIDE (POUR BTL) OPTIME
TOPICAL | Status: DC | PRN
  Administered 2021-07-11: 1000 mL

## 2021-07-11 MED ORDER — MIDAZOLAM HCL 2 MG/2ML IJ SOLN
INTRAMUSCULAR | Status: AC
  Administered 2021-07-11: 2 mg via INTRAVENOUS
  Filled 2021-07-11: qty 2

## 2021-07-11 MED ORDER — FENTANYL CITRATE (PF) 250 MCG/5ML IJ SOLN
INTRAMUSCULAR | Status: AC
  Filled 2021-07-11: qty 5

## 2021-07-11 MED ORDER — FENTANYL CITRATE (PF) 100 MCG/2ML IJ SOLN
INTRAMUSCULAR | Status: AC
  Administered 2021-07-11: 50 ug via INTRAVENOUS
  Filled 2021-07-11: qty 2

## 2021-07-11 MED ORDER — CEFAZOLIN SODIUM-DEXTROSE 2-4 GM/100ML-% IV SOLN
2.0000 g | INTRAVENOUS | Status: AC
  Administered 2021-07-11: 2 g via INTRAVENOUS
  Filled 2021-07-11: qty 100

## 2021-07-11 MED ORDER — PROPOFOL 10 MG/ML IV BOLUS
INTRAVENOUS | Status: DC | PRN
  Administered 2021-07-11 (×2): 20 mg via INTRAVENOUS
  Administered 2021-07-11: 160 mg via INTRAVENOUS

## 2021-07-11 MED ORDER — SODIUM CHLORIDE 0.9 % IV SOLN: INTRAVENOUS | Status: DC

## 2021-07-11 MED ORDER — ACETAMINOPHEN 500 MG PO TABS
1000.0000 mg | ORAL_TABLET | Freq: Four times a day (QID) | ORAL | Status: AC
  Administered 2021-07-11 – 2021-07-12 (×3): 1000 mg via ORAL
  Filled 2021-07-11 (×4): qty 2

## 2021-07-11 MED ORDER — PREDNISONE 20 MG PO TABS: 40.0000 mg | ORAL_TABLET | Freq: Every day | ORAL | Status: DC

## 2021-07-11 MED ORDER — METOCLOPRAMIDE HCL 5 MG PO TABS: 5.0000 mg | ORAL_TABLET | Freq: Three times a day (TID) | ORAL | Status: DC | PRN

## 2021-07-11 MED ORDER — METHYLPREDNISOLONE SODIUM SUCC 40 MG IJ SOLR
40.0000 mg | Freq: Every day | INTRAMUSCULAR | Status: DC
  Administered 2021-07-11 – 2021-07-13 (×3): 40 mg via INTRAVENOUS
  Filled 2021-07-11 (×3): qty 1

## 2021-07-11 MED ORDER — PHENOL 1.4 % MT LIQD: 1.0000 | OROMUCOSAL | Status: DC | PRN

## 2021-07-11 MED ORDER — DEXAMETHASONE SODIUM PHOSPHATE 10 MG/ML IJ SOLN
INTRAMUSCULAR | Status: DC | PRN
  Administered 2021-07-11: 5 mg via INTRAVENOUS

## 2021-07-11 MED ORDER — DEXAMETHASONE SODIUM PHOSPHATE 10 MG/ML IJ SOLN
INTRAMUSCULAR | Status: AC
  Filled 2021-07-11: qty 1

## 2021-07-11 MED ORDER — MIDAZOLAM HCL 2 MG/2ML IJ SOLN: 2.0000 mg | Freq: Once | INTRAMUSCULAR | Status: AC

## 2021-07-11 MED ORDER — TRANEXAMIC ACID 1000 MG/10ML IV SOLN
INTRAVENOUS | Status: DC | PRN
  Administered 2021-07-11: 2000 mg via TOPICAL

## 2021-07-11 MED ORDER — PROPOFOL 10 MG/ML IV BOLUS
INTRAVENOUS | Status: AC
  Filled 2021-07-11: qty 20

## 2021-07-11 MED ORDER — PHENYLEPHRINE 40 MCG/ML (10ML) SYRINGE FOR IV PUSH (FOR BLOOD PRESSURE SUPPORT)
PREFILLED_SYRINGE | INTRAVENOUS | Status: AC
  Filled 2021-07-11: qty 10

## 2021-07-11 MED ORDER — HYDRALAZINE HCL 25 MG PO TABS
25.0000 mg | ORAL_TABLET | Freq: Four times a day (QID) | ORAL | Status: DC | PRN
  Filled 2021-07-11: qty 1

## 2021-07-11 MED ORDER — ASPIRIN 81 MG PO CHEW
81.0000 mg | CHEWABLE_TABLET | Freq: Two times a day (BID) | ORAL | Status: DC
  Administered 2021-07-11 – 2021-07-13 (×4): 81 mg via ORAL
  Filled 2021-07-11 (×4): qty 1

## 2021-07-11 MED ORDER — EPHEDRINE SULFATE-NACL 50-0.9 MG/10ML-% IV SOSY
PREFILLED_SYRINGE | INTRAVENOUS | Status: DC | PRN
  Administered 2021-07-11 (×4): 5 mg via INTRAVENOUS

## 2021-07-11 MED ORDER — ACETAMINOPHEN 500 MG PO TABS
1000.0000 mg | ORAL_TABLET | Freq: Once | ORAL | Status: AC
  Administered 2021-07-11: 1000 mg via ORAL
  Filled 2021-07-11: qty 2

## 2021-07-11 MED ORDER — ONDANSETRON HCL 4 MG/2ML IJ SOLN: 4.0000 mg | Freq: Four times a day (QID) | INTRAMUSCULAR | Status: DC | PRN

## 2021-07-11 MED ORDER — PROPOFOL 500 MG/50ML IV EMUL
INTRAVENOUS | Status: DC | PRN
  Administered 2021-07-11: 150 ug/kg/min via INTRAVENOUS

## 2021-07-11 MED ORDER — CIPROFLOXACIN HCL 500 MG PO TABS
500.0000 mg | ORAL_TABLET | Freq: Two times a day (BID) | ORAL | Status: DC
  Administered 2021-07-11 – 2021-07-13 (×4): 500 mg via ORAL
  Filled 2021-07-11 (×4): qty 1

## 2021-07-11 MED ORDER — METOCLOPRAMIDE HCL 5 MG/ML IJ SOLN: 5.0000 mg | Freq: Three times a day (TID) | INTRAMUSCULAR | Status: DC | PRN

## 2021-07-11 MED ORDER — ONDANSETRON HCL 4 MG/2ML IJ SOLN
INTRAMUSCULAR | Status: DC | PRN
  Administered 2021-07-11: 4 mg via INTRAVENOUS

## 2021-07-11 MED ORDER — BUPIVACAINE-MELOXICAM ER 400-12 MG/14ML IJ SOLN
INTRAMUSCULAR | Status: AC
  Filled 2021-07-11: qty 1

## 2021-07-11 MED ORDER — KETOROLAC TROMETHAMINE 15 MG/ML IJ SOLN
15.0000 mg | Freq: Four times a day (QID) | INTRAMUSCULAR | Status: AC
  Administered 2021-07-11 – 2021-07-12 (×4): 15 mg via INTRAVENOUS
  Filled 2021-07-11 (×4): qty 1

## 2021-07-11 MED ORDER — TRANEXAMIC ACID-NACL 1000-0.7 MG/100ML-% IV SOLN
1000.0000 mg | Freq: Once | INTRAVENOUS | Status: AC
  Administered 2021-07-11: 1000 mg via INTRAVENOUS
  Filled 2021-07-11: qty 100

## 2021-07-11 MED ORDER — DOCUSATE SODIUM 100 MG PO CAPS
100.0000 mg | ORAL_CAPSULE | Freq: Two times a day (BID) | ORAL | Status: DC
  Administered 2021-07-12 (×2): 100 mg via ORAL
  Filled 2021-07-11 (×3): qty 1

## 2021-07-11 MED ORDER — ORAL CARE MOUTH RINSE: 15.0000 mL | Freq: Once | OROMUCOSAL | Status: AC

## 2021-07-11 MED ORDER — DEXAMETHASONE SODIUM PHOSPHATE 10 MG/ML IJ SOLN
INTRAMUSCULAR | Status: DC | PRN
  Administered 2021-07-11 (×2): 5 mg

## 2021-07-11 MED ORDER — CALCIUM CHLORIDE 10 % IV SOLN
INTRAVENOUS | Status: AC
  Filled 2021-07-11: qty 10

## 2021-07-11 MED ORDER — METHOCARBAMOL 1000 MG/10ML IJ SOLN
500.0000 mg | Freq: Four times a day (QID) | INTRAVENOUS | Status: DC | PRN
  Filled 2021-07-11: qty 5

## 2021-07-11 MED ORDER — HYDROMORPHONE HCL 1 MG/ML IJ SOLN
INTRAMUSCULAR | Status: AC
  Filled 2021-07-11: qty 1

## 2021-07-11 MED ORDER — VANCOMYCIN HCL 1000 MG IV SOLR
INTRAVENOUS | Status: AC
  Filled 2021-07-11: qty 20

## 2021-07-11 MED ORDER — HYDROMORPHONE HCL 1 MG/ML IJ SOLN
INTRAMUSCULAR | Status: DC | PRN
  Administered 2021-07-11: .5 mg via INTRAVENOUS

## 2021-07-11 MED ORDER — PHENYLEPHRINE HCL-NACL 20-0.9 MG/250ML-% IV SOLN
INTRAVENOUS | Status: DC | PRN
  Administered 2021-07-11: 45 ug/min via INTRAVENOUS

## 2021-07-11 MED ORDER — LACTATED RINGERS IV SOLN: INTRAVENOUS | Status: DC

## 2021-07-11 MED ORDER — EPINEPHRINE 1 MG/10ML IJ SOSY
PREFILLED_SYRINGE | INTRAMUSCULAR | Status: DC | PRN
  Administered 2021-07-11 (×2): 100 ug via INTRAVENOUS
  Administered 2021-07-11: 50 ug via INTRAVENOUS
  Administered 2021-07-11: 100 ug via INTRAVENOUS

## 2021-07-11 MED ORDER — SUCCINYLCHOLINE CHLORIDE 200 MG/10ML IV SOSY
PREFILLED_SYRINGE | INTRAVENOUS | Status: DC | PRN
  Administered 2021-07-11: 120 mg via INTRAVENOUS

## 2021-07-11 MED ORDER — ROPIVACAINE HCL 5 MG/ML IJ SOLN
INTRAMUSCULAR | Status: DC | PRN
  Administered 2021-07-11: 20 mL via PERINEURAL

## 2021-07-11 MED ORDER — INSULIN ASPART 100 UNIT/ML IJ SOLN
0.0000 [IU] | Freq: Three times a day (TID) | INTRAMUSCULAR | Status: DC
  Administered 2021-07-11: 3 [IU] via SUBCUTANEOUS

## 2021-07-11 MED ORDER — HYDROCODONE-ACETAMINOPHEN 7.5-325 MG PO TABS
1.0000 | ORAL_TABLET | ORAL | Status: DC | PRN
  Administered 2021-07-12 – 2021-07-13 (×3): 2 via ORAL
  Filled 2021-07-11 (×3): qty 2

## 2021-07-11 MED ORDER — VASOPRESSIN 20 UNIT/ML IV SOLN
INTRAVENOUS | Status: DC | PRN
  Administered 2021-07-11: 3 [IU] via INTRAVENOUS
  Administered 2021-07-11: 2 [IU] via INTRAVENOUS
  Administered 2021-07-11: 3 [IU] via INTRAVENOUS
  Administered 2021-07-11: 1 [IU] via INTRAVENOUS
  Administered 2021-07-11: 4 [IU] via INTRAVENOUS

## 2021-07-11 MED ORDER — METHOCARBAMOL 500 MG PO TABS
500.0000 mg | ORAL_TABLET | Freq: Four times a day (QID) | ORAL | Status: DC | PRN
  Administered 2021-07-11 – 2021-07-12 (×2): 500 mg via ORAL
  Filled 2021-07-11 (×2): qty 1

## 2021-07-11 MED ORDER — VANCOMYCIN HCL 1000 MG IV SOLR
INTRAVENOUS | Status: DC | PRN
  Administered 2021-07-11: 1000 mg

## 2021-07-11 MED ORDER — BUPIVACAINE-MELOXICAM ER 400-12 MG/14ML IJ SOLN
INTRAMUSCULAR | Status: DC | PRN
  Administered 2021-07-11: 400 mg

## 2021-07-11 MED ORDER — DIPHENHYDRAMINE HCL 50 MG/ML IJ SOLN
INTRAMUSCULAR | Status: DC | PRN
  Administered 2021-07-11: 50 mg via INTRAVENOUS

## 2021-07-11 MED ORDER — CEFAZOLIN SODIUM-DEXTROSE 2-4 GM/100ML-% IV SOLN
2.0000 g | Freq: Four times a day (QID) | INTRAVENOUS | Status: AC
  Administered 2021-07-11 (×2): 2 g via INTRAVENOUS
  Filled 2021-07-11 (×2): qty 100

## 2021-07-11 MED ORDER — CHLORHEXIDINE GLUCONATE 0.12 % MT SOLN
15.0000 mL | Freq: Once | OROMUCOSAL | Status: AC
  Administered 2021-07-11: 15 mL via OROMUCOSAL
  Filled 2021-07-11: qty 15

## 2021-07-11 MED ORDER — IRRISEPT - 450ML BOTTLE WITH 0.05% CHG IN STERILE WATER, USP 99.95% OPTIME
TOPICAL | Status: DC | PRN
  Administered 2021-07-11: 450 mL via TOPICAL

## 2021-07-11 MED ORDER — HYDROMORPHONE HCL 1 MG/ML IJ SOLN: 0.5000 mg | INTRAMUSCULAR | Status: DC | PRN

## 2021-07-11 MED ORDER — SODIUM CHLORIDE 0.9 % IV SOLN
25.0000 mg | Freq: Once | INTRAVENOUS | Status: DC | PRN
  Filled 2021-07-11: qty 0.5

## 2021-07-11 MED ORDER — FENTANYL CITRATE (PF) 100 MCG/2ML IJ SOLN: 50.0000 ug | Freq: Once | INTRAMUSCULAR | Status: AC

## 2021-07-11 MED ORDER — VASOPRESSIN 20 UNIT/ML IV SOLN
INTRAVENOUS | Status: AC
  Filled 2021-07-11: qty 1

## 2021-07-11 MED ORDER — SODIUM CHLORIDE 0.9 % IR SOLN
Status: DC | PRN
  Administered 2021-07-11: 1000 mL

## 2021-07-11 MED ORDER — CALCIUM CHLORIDE 10 % IV SOLN
INTRAVENOUS | Status: DC | PRN
  Administered 2021-07-11 (×2): 100 mg via INTRAVENOUS

## 2021-07-11 MED ORDER — EPHEDRINE 5 MG/ML INJ
INTRAVENOUS | Status: AC
  Filled 2021-07-11: qty 5

## 2021-07-11 MED ORDER — DULAGLUTIDE 1.5 MG/0.5ML ~~LOC~~ SOAJ
1.5000 mg | SUBCUTANEOUS | Status: DC
Start: 2021-07-15 — End: 2021-07-13

## 2021-07-11 MED ORDER — HYDROCORTISONE SOD SUC (PF) 100 MG IJ SOLR
INTRAMUSCULAR | Status: DC | PRN
  Administered 2021-07-11: 250 mg via INTRAVENOUS

## 2021-07-11 MED ORDER — TRANEXAMIC ACID-NACL 1000-0.7 MG/100ML-% IV SOLN
1000.0000 mg | INTRAVENOUS | Status: AC
  Administered 2021-07-11: 1000 mg via INTRAVENOUS
  Filled 2021-07-11: qty 100

## 2021-07-11 MED ORDER — ONDANSETRON HCL 4 MG PO TABS
4.0000 mg | ORAL_TABLET | Freq: Four times a day (QID) | ORAL | Status: DC | PRN
  Administered 2021-07-11: 4 mg via ORAL
  Filled 2021-07-11: qty 1

## 2021-07-11 MED ORDER — ONDANSETRON HCL 4 MG/2ML IJ SOLN
INTRAMUSCULAR | Status: AC
  Filled 2021-07-11: qty 2

## 2021-07-11 MED ORDER — GLIPIZIDE ER 2.5 MG PO TB24
2.5000 mg | ORAL_TABLET | Freq: Every day | ORAL | Status: DC
  Administered 2021-07-12 – 2021-07-13 (×2): 2.5 mg via ORAL
  Filled 2021-07-11 (×3): qty 1

## 2021-07-11 MED ORDER — PHENYLEPHRINE 40 MCG/ML (10ML) SYRINGE FOR IV PUSH (FOR BLOOD PRESSURE SUPPORT)
PREFILLED_SYRINGE | INTRAVENOUS | Status: DC | PRN
  Administered 2021-07-11 (×2): 120 ug via INTRAVENOUS
  Administered 2021-07-11: 80 ug via INTRAVENOUS
  Administered 2021-07-11: 120 ug via INTRAVENOUS
  Administered 2021-07-11: 80 ug via INTRAVENOUS

## 2021-07-11 MED ORDER — DEXAMETHASONE SODIUM PHOSPHATE 10 MG/ML IJ SOLN: 10.0000 mg | Freq: Once | INTRAMUSCULAR | Status: DC

## 2021-07-11 MED ORDER — FAMOTIDINE IN NACL 20-0.9 MG/50ML-% IV SOLN
20.0000 mg | INTRAVENOUS | Status: DC
  Administered 2021-07-11 – 2021-07-12 (×2): 20 mg via INTRAVENOUS
  Filled 2021-07-11 (×3): qty 50

## 2021-07-11 MED ORDER — LIDOCAINE 2% (20 MG/ML) 5 ML SYRINGE
INTRAMUSCULAR | Status: DC | PRN
  Administered 2021-07-11: 60 mg via INTRAVENOUS

## 2021-07-11 MED ORDER — POVIDONE-IODINE 10 % EX SWAB
2.0000 "application " | Freq: Once | CUTANEOUS | Status: AC
  Administered 2021-07-11: 2 via TOPICAL

## 2021-07-11 MED ORDER — FENTANYL CITRATE (PF) 250 MCG/5ML IJ SOLN
INTRAMUSCULAR | Status: DC | PRN
  Administered 2021-07-11: 100 ug via INTRAVENOUS
  Administered 2021-07-11: 25 ug via INTRAVENOUS

## 2021-07-11 MED ORDER — FENTANYL CITRATE (PF) 100 MCG/2ML IJ SOLN: 25.0000 ug | INTRAMUSCULAR | Status: DC | PRN

## 2021-07-11 MED ORDER — LIDOCAINE 2% (20 MG/ML) 5 ML SYRINGE
INTRAMUSCULAR | Status: AC
  Filled 2021-07-11: qty 5

## 2021-07-11 MED ORDER — MENTHOL 3 MG MT LOZG: 1.0000 | LOZENGE | OROMUCOSAL | Status: DC | PRN

## 2021-07-11 SURGICAL SUPPLY — 92 items
ADH SKN CLS APL DERMABOND .7 (GAUZE/BANDAGES/DRESSINGS) ×1
ALCOHOL 70% 16 OZ (MISCELLANEOUS) ×3 IMPLANT
BAG COUNTER SPONGE SURGICOUNT (BAG) IMPLANT
BAG DECANTER FOR FLEXI CONT (MISCELLANEOUS) ×3 IMPLANT
BAG SPNG CNTER NS LX DISP (BAG)
BAG SURGICOUNT SPONGE COUNTING (BAG)
BANDAGE ESMARK 6X9 LF (GAUZE/BANDAGES/DRESSINGS) IMPLANT
BLADE SAG 18X100X1.27 (BLADE) ×3 IMPLANT
BNDG CMPR 9X6 STRL LF SNTH (GAUZE/BANDAGES/DRESSINGS)
BNDG ELASTIC 4X5.8 VLCR STR LF (GAUZE/BANDAGES/DRESSINGS) ×2 IMPLANT
BNDG ELASTIC 6X5.8 VLCR STR LF (GAUZE/BANDAGES/DRESSINGS) ×2 IMPLANT
BNDG ESMARK 6X9 LF (GAUZE/BANDAGES/DRESSINGS)
BOWL SMART MIX CTS (DISPOSABLE) ×3 IMPLANT
BSPLAT TIB 5D D CMNT STM LT (Knees) ×1 IMPLANT
CEMENT BONE REFOBACIN R1X40 US (Cement) ×4 IMPLANT
CLOSURE STERI-STRIP 1/2X4 (GAUZE/BANDAGES/DRESSINGS) ×2
CLSR STERI-STRIP ANTIMIC 1/2X4 (GAUZE/BANDAGES/DRESSINGS) ×4 IMPLANT
COMP MED POLY AS PERS S6-7 12 (Joint) ×3 IMPLANT
COMPONENT MED PLY PERSS6-7 12 (Joint) IMPLANT
COOLER ICEMAN CLASSIC (MISCELLANEOUS) ×3 IMPLANT
COVER SURGICAL LIGHT HANDLE (MISCELLANEOUS) ×3 IMPLANT
CUFF TOURN SGL QUICK 34 (TOURNIQUET CUFF) ×3
CUFF TOURN SGL QUICK 42 (TOURNIQUET CUFF) IMPLANT
CUFF TRNQT CYL 34X4.125X (TOURNIQUET CUFF) ×1 IMPLANT
DERMABOND ADVANCED (GAUZE/BANDAGES/DRESSINGS) ×2
DERMABOND ADVANCED .7 DNX12 (GAUZE/BANDAGES/DRESSINGS) ×1 IMPLANT
DRAPE EXTREMITY T 121X128X90 (DISPOSABLE) ×3 IMPLANT
DRAPE HALF SHEET 40X57 (DRAPES) ×3 IMPLANT
DRAPE INCISE IOBAN 66X45 STRL (DRAPES) ×3 IMPLANT
DRAPE ORTHO SPLIT 77X108 STRL (DRAPES) ×6
DRAPE POUCH INSTRU U-SHP 10X18 (DRAPES) ×3 IMPLANT
DRAPE SURG ORHT 6 SPLT 77X108 (DRAPES) ×2 IMPLANT
DRAPE U-SHAPE 47X51 STRL (DRAPES) ×6 IMPLANT
DRSG AQUACEL AG ADV 3.5X10 (GAUZE/BANDAGES/DRESSINGS) ×1 IMPLANT
DRSG XEROFORM 1X8 (GAUZE/BANDAGES/DRESSINGS) ×2 IMPLANT
DURAPREP 26ML APPLICATOR (WOUND CARE) ×9 IMPLANT
ELECT CAUTERY BLADE 6.4 (BLADE) ×3 IMPLANT
ELECT REM PT RETURN 9FT ADLT (ELECTROSURGICAL) ×3
ELECTRODE REM PT RTRN 9FT ADLT (ELECTROSURGICAL) ×1 IMPLANT
FEMUR CMT CR STD SZ 6 LT KNEE (Joint) ×3 IMPLANT
FEMUR CMTD CR STD SZ 6 LT KNEE (Joint) IMPLANT
GAUZE SPONGE 4X4 12PLY STRL LF (GAUZE/BANDAGES/DRESSINGS) ×2 IMPLANT
GLOVE SURG SYN 7.5  E (GLOVE) ×12
GLOVE SURG SYN 7.5 E (GLOVE) ×4 IMPLANT
GLOVE SURG SYN 7.5 PF PI (GLOVE) ×4 IMPLANT
GLOVE SURG UNDER LTX SZ7.5 (GLOVE) ×6 IMPLANT
GLOVE SURG UNDER POLY LF SZ7 (GLOVE) ×3 IMPLANT
GOWN STRL REIN XL XLG (GOWN DISPOSABLE) ×3 IMPLANT
GOWN STRL REUS W/ TWL LRG LVL3 (GOWN DISPOSABLE) ×1 IMPLANT
GOWN STRL REUS W/TWL LRG LVL3 (GOWN DISPOSABLE) ×3
HANDPIECE INTERPULSE COAX TIP (DISPOSABLE) ×3
HDLS TROCR DRIL PIN KNEE 75 (PIN) ×3
HOOD PEEL AWAY FLYTE STAYCOOL (MISCELLANEOUS) ×6 IMPLANT
JET LAVAGE IRRISEPT WOUND (IRRIGATION / IRRIGATOR) ×3
KIT BASIN OR (CUSTOM PROCEDURE TRAY) ×3 IMPLANT
KIT TURNOVER KIT B (KITS) ×3 IMPLANT
LAVAGE JET IRRISEPT WOUND (IRRIGATION / IRRIGATOR) ×1 IMPLANT
MANIFOLD NEPTUNE II (INSTRUMENTS) ×3 IMPLANT
MARKER SKIN DUAL TIP RULER LAB (MISCELLANEOUS) ×6 IMPLANT
NDL SPNL 18GX3.5 QUINCKE PK (NEEDLE) ×1 IMPLANT
NEEDLE SPNL 18GX3.5 QUINCKE PK (NEEDLE) ×3 IMPLANT
NS IRRIG 1000ML POUR BTL (IV SOLUTION) ×3 IMPLANT
PACK TOTAL JOINT (CUSTOM PROCEDURE TRAY) ×3 IMPLANT
PAD ABD 8X10 STRL (GAUZE/BANDAGES/DRESSINGS) ×2 IMPLANT
PAD ARMBOARD 7.5X6 YLW CONV (MISCELLANEOUS) ×6 IMPLANT
PAD CAST 4YDX4 CTTN HI CHSV (CAST SUPPLIES) IMPLANT
PAD COLD SHLDR WRAP-ON (PAD) ×3 IMPLANT
PADDING CAST COTTON 4X4 STRL (CAST SUPPLIES) ×3
PADDING CAST COTTON 6X4 STRL (CAST SUPPLIES) ×2 IMPLANT
PIN DRILL HDLS TROCAR 75 4PK (PIN) IMPLANT
SAW OSC TIP CART 19.5X105X1.3 (SAW) ×3 IMPLANT
SCREW FEMALE HEX FIX 25X2.5 (ORTHOPEDIC DISPOSABLE SUPPLIES) ×2 IMPLANT
SET HNDPC FAN SPRY TIP SCT (DISPOSABLE) ×1 IMPLANT
STAPLER VISISTAT 35W (STAPLE) IMPLANT
STEM POLY PAT PLY 32M KNEE (Knees) ×2 IMPLANT
STEM TIBIA 5 DEG SZ D L KNEE (Knees) IMPLANT
SUCTION FRAZIER HANDLE 10FR (MISCELLANEOUS) ×3
SUCTION TUBE FRAZIER 10FR DISP (MISCELLANEOUS) ×1 IMPLANT
SUT ETHILON 2 0 FS 18 (SUTURE) ×6 IMPLANT
SUT MNCRL AB 4-0 PS2 18 (SUTURE) IMPLANT
SUT VIC AB 0 CT1 27 (SUTURE) ×6
SUT VIC AB 0 CT1 27XBRD ANBCTR (SUTURE) ×2 IMPLANT
SUT VIC AB 1 CTX 27 (SUTURE) ×9 IMPLANT
SUT VIC AB 2-0 CT1 27 (SUTURE) ×12
SUT VIC AB 2-0 CT1 TAPERPNT 27 (SUTURE) ×4 IMPLANT
SYR 50ML LL SCALE MARK (SYRINGE) ×6 IMPLANT
TIBIA STEM 5 DEG SZ D L KNEE (Knees) ×3 IMPLANT
TOWEL GREEN STERILE (TOWEL DISPOSABLE) ×3 IMPLANT
TOWEL GREEN STERILE FF (TOWEL DISPOSABLE) ×3 IMPLANT
TRAY CATH 16FR W/PLASTIC CATH (SET/KITS/TRAYS/PACK) IMPLANT
UNDERPAD 30X36 HEAVY ABSORB (UNDERPADS AND DIAPERS) ×3 IMPLANT
YANKAUER SUCT BULB TIP NO VENT (SUCTIONS) ×6 IMPLANT

## 2021-07-11 NOTE — H&P (Signed)
PREOPERATIVE H&P  Chief Complaint: left knee degenerative joint disease  HPI: Melissa [name] is a 70 y.o. female who presents for surgical treatment of left knee degenerative joint disease.  She denies any changes in medical history.  Past Medical History:  Diagnosis Date   Allergy    Anxiety    Arthritis    s/p R TKR   Cyst of left kidney 2015   by lumbar MRI pending renal US   DDD (degenerative disc disease), lumbar 03/2014   severe L2-3 with L HNP with L2/3 nerve root impingement [name])   Depression    Depression with anxiety    Diabetes type 2, controlled ([place]) 2011   borderline   Dyspnea    Glaucoma    History of ulcer disease    HLD (hyperlipidemia)    no meds taken   Hypertension    Plantar fasciitis of left foot 10/01/2013   PONV (postoperative nausea and vomiting)    Right-sided face pain 01/07/2019   Sleep apnea 2022   Ulcer    Past Surgical History:  Procedure Laterality Date   ABDOMINAL HYSTERECTOMY  2007   fibroids, heavy bleeding   ARTERY BIOPSY Right 01/23/2019   Procedure: REMOVAL OF PART OF RIGHT TEMPORAL ARTERY FOR BIOPSY;  Surgeon: Michael Boston, MD;  Location: [facility];  Service: General;  Laterality: Right;   CATARACT EXTRACTION W/ INTRAOCULAR LENS IMPLANT Bilateral    COLONOSCOPY  04/2017   TA, diverticulosis, rpt ? ([facility])   GLAUCOMA SURGERY     HAMMER TOE SURGERY Bilateral    Right hip replacement     TONSILLECTOMY     TOTAL HIP ARTHROPLASTY Left 2021   TOTAL KNEE ARTHROPLASTY Right 2004   TKR (Dr. Eddie Dibbles with [facility])   TOTAL KNEE REVISION Right 03/2014   Dr Al [place]us [place] Right 2022   Social History   Socioeconomic History   Marital status: Single    Spouse name: Not on file   Number of children: 0   Years of education: 16   Highest education level: Not on file  Occupational History   Occupation: work with disabilities/ Retired    Fish farm manager: Oceanographer  Tobacco Use   Smoking status:  Never   Smokeless tobacco: Never  Vaping Use   Vaping Use: Never used  Substance and Sexual Activity   Alcohol use: No   Drug use: No   Sexual activity: Not Currently    Birth control/protection: Surgical  Other Topics Concern   Not on file  Social History Narrative   Caffeine: rare   Lives alone   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[employer]   Fun/Hobbies: Gardening    Social Determinants of Health   Financial Resource Strain: Not on file  Food Insecurity: Not on file  Transportation Needs: Not on file  Physical Activity: Not on file  Stress: Not on file  Social Connections: Not on file   Family History  Problem Relation Age of Onset   Cancer Maternal Grandmother 69       ovarian   Diabetes Maternal Grandmother    Hypertension Maternal Grandmother    Cancer Mother 27       bone, MM   Stroke Other        unsure who   CAD Neg Hx    Colon cancer Neg Hx    Esophageal cancer Neg Hx    Rectal cancer Neg Hx  Stomach cancer Neg Hx    Allergies  Allergen Reactions   Codeine Other (See Comments)    palpitations   Metformin And Related Nausea Only   Other     Derma bond - contact dermatitis   Oxycodone Nausea And Vomiting   Prior to Admission medications   Medication Sig Start Date End Date Taking? Authorizing Provider  acetaminophen (TYLENOL) 500 MG tablet Take 500 mg by mouth every 6 (six) hours as needed for moderate pain or headache.   Yes [provider]  ALPRAZolam (XANAX) 0.25 MG tablet TAKE 1 TABLET(0.25 MG) BY MOUTH AT BEDTIME AS NEEDED FOR SLEEP 12/04/19  Yes Biagio Borg, MD  aspirin EC 81 MG tablet Take 1 tablet (81 mg total) by mouth 2 (two) times daily. To be taken after surgery 07/08/21  Yes Aundra Dubin, PA-C  ciprofloxacin (CIPRO) 500 MG tablet Take 1 tablet (500 mg total) by mouth 2 (two) times daily for 6 days. 07/08/21 07/14/21 Yes Aundra Dubin, PA-C  Crisaborole (EUCRISA) 2 % OINT Apply 1 application topically daily as  needed (Itchy skin). 06/23/21  Yes Padgett, Rae Halsted, MD  cycloSPORINE (RESTASIS) 0.05 % ophthalmic emulsion Place 1 drop into both eyes 2 (two) times daily as needed (dry eyes).   Yes [provider]  desonide (DESOWEN) 0.05 % ointment Apply 1 application topically 2 (two) times daily as needed. 07/05/21  Yes Padgett, Rae Halsted, MD  glipiZIDE (GLUCOTROL XL) 2.5 MG 24 hr tablet Take 2.5 mg by mouth daily with breakfast.   Yes [provider]  hydrochlorothiazide (HYDRODIURIL) 25 MG tablet Take 1 tablet (25 mg total) by mouth daily. Patient taking differently: Take 25 mg by mouth daily as needed (swelling). 05/07/20  Yes Yu, Amy V, PA-C  levocetirizine (XYZAL) 5 MG tablet Take 1 tablet (5 mg total) by mouth every evening. 06/23/21  Yes Padgett, Rae Halsted, MD  lisinopril (ZESTRIL) 20 MG tablet Take 1 tablet (20 mg total) by mouth daily. 05/11/21  Yes Angelica Pou, MD  MULTIPLE VITAMIN PO Take 1 tablet by mouth daily.   Yes [provider]  omeprazole (PRILOSEC) 20 MG capsule Take 20 mg by mouth daily as needed (heartburn).   Yes [provider]  TRULICITY 1.5 OE/4.2PN SOPN Inject 1.5 mg into the skin every Friday. 03/16/21  Yes [provider]  VYZULTA 0.024 % SOLN Place 1 drop into both eyes at bedtime. 05/16/21  Yes [provider]  Accu-Chek FastClix Lancets MISC USE TO TEST BLOOD SUGAR UP TO FOUR TIMES DAILY 11/25/18   Lance Sell, NP  ACCU-CHEK GUIDE test strip USE AS DIRECTED FOUR TIMES DAILY 06/28/20   Biagio Borg, MD  blood glucose meter kit and supplies KIT Dispense based on patient and insurance preference. Use up to four times daily as directed. DX Code: E11.9 02/11/18   Lance Sell, NP  docusate sodium (COLACE) 100 MG capsule Take 1 capsule (100 mg total) by mouth daily as needed. 07/08/21 07/08/22  Aundra Dubin, PA-C  HYDROcodone-acetaminophen (NORCO) 7.5-325 MG tablet Take 1-2 tablets by mouth  every 6 (six) hours as needed for moderate pain. To be taken after surgery 07/08/21   Aundra Dubin, PA-C  hydroquinone 4 % cream Apply to darker areas of skin once a day as needed 07/07/21   Angelica Pou, MD  methocarbamol (ROBAXIN) 500 MG tablet Take 1 tablet (500 mg total) by mouth 2 (two) times daily as needed. To be  taken after surgery 07/08/21   Aundra Dubin, PA-C  ondansetron (ZOFRAN) 4 MG tablet Take 1 tablet (4 mg total) by mouth every 8 (eight) hours as needed for nausea or vomiting. 07/08/21   Aundra Dubin, PA-C  sulfamethoxazole-trimethoprim (BACTRIM DS) 800-160 MG tablet Take 1 tablet by mouth 2 (two) times daily. To be taken after surgery 07/08/21   Aundra Dubin, PA-C     Positive ROS: All other systems have been reviewed and were otherwise negative with the exception of those mentioned in the HPI and as above.  Physical Exam: General: Alert, no acute distress Cardiovascular: No pedal edema Respiratory: No cyanosis, no use of accessory musculature GI: abdomen soft Skin: No lesions in the area of chief complaint Neurologic: Sensation intact distally Psychiatric: Patient is competent for consent with normal mood and affect Lymphatic: no lymphedema  MUSCULOSKELETAL: exam stable  Assessment: left knee degenerative joint disease  Plan: Plan for Procedure(s): LEFT TOTAL KNEE ARTHROPLASTY  The risks benefits and alternatives were discussed with the patient including but not limited to the risks of nonoperative treatment, versus surgical intervention including infection, bleeding, nerve injury,  blood clots, cardiopulmonary complications, morbidity, mortality, among others, and they were willing to proceed.   Preoperative templating of the joint replacement has been completed, documented, and submitted to the Operating Room personnel in order to optimize intra-operative equipment management.   Eduard Roux, MD 07/11/2021 9:02 AM

## 2021-07-11 NOTE — Plan of Care (Signed)

## 2021-07-11 NOTE — Anesthesia Procedure Notes (Signed)
Procedure Name: MAC Date/Time: 07/11/2021 9:10 AM Performed by: Trinna Post., CRNA Pre-anesthesia Checklist: Patient identified, Emergency Drugs available, Suction available, Patient being monitored and Timeout performed Patient Re-evaluated:Patient Re-evaluated prior to induction Oxygen Delivery Method: Simple face mask Preoxygenation: Pre-oxygenation with 100% oxygen Induction Type: IV induction Placement Confirmation: positive ETCO2

## 2021-07-11 NOTE — Transfer of Care (Signed)
Immediate Anesthesia Transfer of Care Note  Patient: Melissa [name]  Procedure(s) Performed: LEFT TOTAL KNEE ARTHROPLASTY (Left: Knee)  Patient Location: PACU  Anesthesia Type:General and Regional  Level of Consciousness: awake and drowsy  Airway & Oxygen Therapy: Patient Spontanous Breathing and Patient connected to face mask oxygen  Post-op Assessment: Report given to RN and Post -op Vital signs reviewed and stable  Post vital signs: Reviewed and stable  Last Vitals:  Vitals Value Taken Time  BP 144/78 07/11/21 1205  Temp 35.8 C 07/11/21 1205  Pulse 79 07/11/21 1213  Resp 14 07/11/21 1213  SpO2 100 % 07/11/21 1213  Vitals shown include unvalidated device data.  Last Pain:  Vitals:   07/11/21 1205  PainSc: 0-No pain      Patients Stated Pain Goal: 0 (58/09/98 3382)  Complications: No notable events documented.

## 2021-07-11 NOTE — Anesthesia Procedure Notes (Signed)
Procedure Name: LMA Insertion Date/Time: 07/11/2021 9:32 AM Performed by: Trinna Post., CRNA Pre-anesthesia Checklist: Patient identified, Emergency Drugs available, Suction available, Patient being monitored and Timeout performed Patient Re-evaluated:Patient Re-evaluated prior to induction Oxygen Delivery Method: Circle system utilized Preoxygenation: Pre-oxygenation with 100% oxygen Induction Type: IV induction Ventilation: Mask ventilation without difficulty LMA: LMA inserted LMA Size: 4.0 Placement Confirmation: positive ETCO2 and breath sounds checked- equal and bilateral Tube secured with: Tape Dental Injury: Teeth and Oropharynx as per pre-operative assessment

## 2021-07-11 NOTE — Evaluation (Signed)
Physical Therapy Evaluation Patient Details Name: Melissa [name] MRN: [id number] DOB: 1951/02/20 Today's Date: 07/11/2021  History of Present Illness  Pt is 70 yo female s/p L TKA on 07/11/21.  Post-op she did have a rash and hypotension -suspected allergic reaction , resolved with epinephrine injection.  Pt with hx of arthritis, DDD, DM2, HLD, HTN, hammer toe sx bil; bil THA; R TKA and R TKA revision, R TSA summer 2022.   Clinical Impression  Pt is s/p TKA resulting in the deficits listed below (see PT Problem List). At baseline, pt is independent and lives alone.  She plans to return home with limited support from brother and neighbor.  She does have DME and has bought easy prep meals/sandwiches.  Today, pt was seen on DOS.  She was limited by pain but was able to get OOB and take a few steps to bsc.  She required increased time, effort, cues, and min A.  Pt with limited knee flexion due to pain. Pt somewhat struggles with transfers due to UE weakness - particularly R shoulder -reports still recovering from recent TSA.  Pt expected to progress with therapy ; however, may need more time than normal due to prior deficits and limited support at home (notified MD via secure chat). Pt will benefit from skilled PT to increase their independence and safety with mobility to allow discharge to the venue listed below.         Recommendations for follow up therapy are one component of a multi-disciplinary discharge planning process, led by the attending physician.  Recommendations may be updated based on patient status, additional functional criteria and insurance authorization.  Follow Up Recommendations Follow physician's recommendations for discharge plan and follow up therapies    Assistance Recommended at Discharge Frequent or constant Supervision/Assistance  Functional Status Assessment Patient has had a recent decline in their functional status and demonstrates the ability to make significant  improvements in function in a reasonable and predictable amount of time.  Equipment Recommendations  None recommended by PT    Recommendations for Other Services       Precautions / Restrictions Precautions Precautions: Fall Restrictions Weight Bearing Restrictions: Yes LLE Weight Bearing: Weight bearing as tolerated      Mobility  Bed Mobility Overal bed mobility: Needs Assistance Bed Mobility: Supine to Sit;Sit to Supine     Supine to sit: Mod assist Sit to supine: Min assist   General bed mobility comments: Significantly increased time and effort for both transfers.  Pt somewhat limited due to R UE weakness (reports still recovering from sx). Requiring mod cues with encouragement.  Required assist for L LE    Transfers Overall transfer level: Needs assistance Equipment used: Rolling walker (2 wheels) Transfers: Sit to/from Stand Sit to Stand: Min assist           General transfer comment: Sit to stand from elevated bed and BSC - requiring min A to rise, cues for hand placement and L LE management, multiple attempts from [facility] with increased time to rise    Ambulation/Gait Ambulation/Gait assistance: Min assist Gait Distance (Feet): 2 Feet (2'x2) Assistive device: Rolling walker (2 wheels) Gait Pattern/deviations: Step-to pattern;Decreased stride length;Decreased weight shift to left Gait velocity: decreased     General Gait Details: Pt took small steps to and from [facility] with min A and cues.  Limited due to pain  Stairs            Wheelchair Mobility    Modified Rankin (  Stroke Patients Only)       Balance Overall balance assessment: Needs assistance Sitting-balance support: No upper extremity supported Sitting balance-Leahy Scale: Good     Standing balance support: Bilateral upper extremity supported;No upper extremity supported Standing balance-Leahy Scale: Fair Standing balance comment: RW to mobilize but able to perform toileting ADLs without  UE support and min guard                             Pertinent Vitals/Pain Pain Assessment: 0-10 Pain Score: 8  Pain Location: L knee Pain Descriptors / Indicators: Aching;Discomfort Pain Intervention(s): Limited activity within patient's tolerance;Monitored during session    [place] expects to be discharged to:: Private residence Living Arrangements: Alone Available Help at Discharge: Family;Neighbor (limited support from brother and neighbor) Type of Home: House Home Access: Level entry       Home Layout: One [place]: Grab bars - tub/shower;Cane - single Barista (2 wheels);BSC/3in1      Prior Function Prior Level of Function : Independent/Modified Independent;Driving             Mobility Comments: Uses a cane at times; can ambulate in community ADLs Comments: Independent with aDLs and IADLs     Hand Dominance        Extremity/Trunk Assessment   Upper Extremity Assessment Upper Extremity Assessment: LUE deficits/detail;RUE deficits/detail RUE Deficits / Details: Pt with recent R TSA Summer 2022.  ROM: Shoulder elevation ~80 degrees, elbow and hand WFL; MMT: Shoulder 3/5, elbow and hand 4/5; reports she has not fully recovered from TSA and was going to start therapy again LUE Deficits / Details: ROM: shoulder elevation 90 degrees only (prior rotator cuff sx), elbow and hand WFL; MMT 4/5    Lower Extremity Assessment Lower Extremity Assessment: LLE deficits/detail;RLE deficits/detail RLE Deficits / Details: ROM WFL; MMT 5/5 LLE Deficits / Details: Post op TKA today.  ROM: tends to internally rotate entire leg (reports baseline), ankle and hip otherwise WFL, knee 5 to 20 degrees (limited by pain/fear of pain); MMT: ankle 5/5, knee 3/5, hip 3/5    Cervical / Trunk Assessment Cervical / Trunk Assessment: Normal  Communication   Communication: No difficulties  Cognition Arousal/Alertness: Awake/alert Behavior  During Therapy: Anxious Overall Cognitive Status: Within Functional Limits for tasks assessed                                          General Comments General comments (skin integrity, edema, etc.): Pt tending to internally rotate L hip - she contributed to hammer toe sx and foot turning in.  Request blanket roll to block internal rotation - PT agrees this is good idea for knee positioning and placed roll.    Exercises     Assessment/Plan    PT Assessment Patient needs continued PT services  PT Problem List Decreased strength;Decreased mobility;Decreased range of motion;Decreased activity tolerance;Decreased balance;Decreased knowledge of use of DME;Pain       PT Treatment Interventions DME instruction;Therapeutic activities;Modalities;Gait training;Therapeutic exercise;Patient/family education;Stair training;Balance training;Functional mobility training    PT Goals (Current goals can be found in the Care Plan section)  Acute Rehab PT Goals Patient Stated Goal: return home PT Goal Formulation: With patient Time For Goal Achievement: 07/25/21 Potential to Achieve Goals: Good    Frequency 7X/week   Barriers to discharge Decreased caregiver  support      Co-evaluation               AM-PAC PT "6 Clicks" Mobility  Outcome Measure Help needed turning from your back to your side while in a flat bed without using bedrails?: A Little Help needed moving from lying on your back to sitting on the side of a flat bed without using bedrails?: A Lot Help needed moving to and from a bed to a chair (including a wheelchair)?: A Little Help needed standing up from a chair using your arms (e.g., wheelchair or bedside chair)?: A Little Help needed to walk in hospital room?: A Lot Help needed climbing 3-5 steps with a railing? : Total 6 Click Score: 14    End of Session Equipment Utilized During Treatment: Gait belt Activity Tolerance: Patient limited by pain Patient  left: in bed;with call bell/phone within reach;with bed alarm set (ice cuff in place) Nurse Communication: Mobility status PT Visit Diagnosis: Other abnormalities of gait and mobility (R26.89);Muscle weakness (generalized) (M62.81);Pain Pain - Right/Left: Left Pain - part of body: Knee    Time: 8638-1771 PT Time Calculation (min) (ACUTE ONLY): 35 min   Charges:   PT Evaluation $PT Eval Low Complexity: 1 Low PT Treatments $Therapeutic Activity: 8-22 mins        Abran Richard, PT Acute Rehab Services Pager [phone] [facility] [phone]   Karlton Lemon 07/11/2021, 5:28 PM

## 2021-07-11 NOTE — Anesthesia Preprocedure Evaluation (Addendum)
Anesthesia Evaluation  Patient identified by MRN, date of birth, ID band Patient awake    Reviewed: Allergy & Precautions, NPO status , Patient's Chart, lab work & pertinent test results  History of Anesthesia Complications (+) PONV  Airway Mallampati: I  TM Distance: >3 FB Neck ROM: Full    Dental no notable dental hx. (+) Teeth Intact, Dental Advisory Given   Pulmonary sleep apnea ,    Pulmonary exam normal breath sounds clear to auscultation       Cardiovascular hypertension, Pt. on medications Normal cardiovascular exam Rhythm:Regular Rate:Normal  TTE 2015 EF normal, valves ok   Neuro/Psych PSYCHIATRIC DISORDERS Anxiety Depression negative neurological ROS     GI/Hepatic Neg liver ROS, GERD  Controlled and Medicated,  Endo/Other  diabetes, Type 2, Oral Hypoglycemic Agents  Renal/GU negative Renal ROS  negative genitourinary   Musculoskeletal negative musculoskeletal ROS (+)   Abdominal   Peds  Hematology negative hematology ROS (+)   Anesthesia Other Findings   Reproductive/Obstetrics                            Anesthesia Physical Anesthesia Plan  ASA: 2  Anesthesia Plan: Spinal and Regional   Post-op Pain Management:  Regional for Post-op pain   Induction:   PONV Risk Score and Plan: 3 and Treatment may vary due to age or medical condition, Midazolam, Propofol infusion, Ondansetron and Dexamethasone  Airway Management Planned: Natural Airway  Additional Equipment:   Intra-op Plan:   Post-operative Plan:   Informed Consent: I have reviewed the patients History and Physical, chart, labs and discussed the procedure including the risks, benefits and alternatives for the proposed anesthesia with the patient or authorized representative who has indicated his/her understanding and acceptance.     Dental advisory given  Plan Discussed with: CRNA  Anesthesia Plan  Comments:         Anesthesia Quick Evaluation

## 2021-07-11 NOTE — Anesthesia Procedure Notes (Signed)
Anesthesia Regional Block: Adductor canal block   Pre-Anesthetic Checklist: , timeout performed,  Correct Patient, Correct Site, Correct Laterality,  Correct Procedure, Correct Position, site marked,  Risks and benefits discussed,  Surgical consent,  Pre-op evaluation,  At surgeon's request and post-op pain management  Laterality: Left  Prep: Maximum Sterile Barrier Precautions used, chloraprep       Needles:  Injection technique: Single-shot  Needle Type: Echogenic Stimulator Needle     Needle Length: 9cm  Needle Gauge: 22     Additional Needles:   Procedures:,,,, ultrasound used (permanent image in chart),,    Narrative:  Start time: 07/11/2021 8:50 AM End time: 07/11/2021 8:54 AM Injection made incrementally with aspirations every 5 mL.  Performed by: Personally  Anesthesiologist: Freddrick March, MD  Additional Notes: Monitors applied. No increased pain on injection. No increased resistance to injection. Injection made in 5cc increments. Good needle visualization. Patient tolerated procedure well.

## 2021-07-11 NOTE — Consult Note (Signed)
Cardiology Consultation:   Patient ID: Melissa [name]; [id number]; 1951/05/03   Admit date: 07/11/2021 Date of Consult: 07/11/2021  Primary Care Provider: Angelica Pou, MD Primary Cardiologist: None Primary Electrophysiologist:     Patient Profile:   Melissa [name] is a 70 y.o. female with a hx of HTN and DM who is being seen today for the evaluation of elevated troponin and hypotension at the request of Dr. Erlinda Hong.  History of Present Illness:   Melissa [name] who had an episode of hypotension.  This was after surgery today for total knee replacement .  Hypertension apparently happened after surgery and extubation.  There was also a rash on the right upper arm and right shoulder.  It was thought perhaps she was having anaphylaxis and she was given a milligram of epinephrine.  She recovered quickly.  There were no acute EKG changes noted and I reviewed the EKG from earlier today.  However, troponins were drawn and these were mildly elevated with a trend upward as recorded below.  She lives by herself.  The patient denies any new symptoms such as chest discomfort, neck or arm discomfort. There has been no new shortness of breath, PND or orthopnea. There have been no reported palpitations, presyncope or syncope.  She does have chronic knee pain and had recent right shoulder surgery.  However, she can do her chores of daily living without any cardiovascular symptoms.  She does not recall having any symptoms today.  The patient previously saw Dr. Martinique because of hypertension, dyslipidemia and diabetes.  Past Medical History:  Diagnosis Date   Allergy    Anxiety    Arthritis    s/p R TKR   Cyst of left kidney 2015   by lumbar MRI pending renal US   DDD (degenerative disc disease), lumbar 03/2014   severe L2-3 with L HNP with L2/3 nerve root impingement [name])   Depression with anxiety    Diabetes type 2, controlled ([place]) 2011   borderline   Glaucoma    History of ulcer  disease    HLD (hyperlipidemia)    no meds taken   Hypertension    Plantar fasciitis of left foot 10/01/2013   PONV (postoperative nausea and vomiting)    Right-sided face pain 01/07/2019   Sleep apnea 2022    Past Surgical History:  Procedure Laterality Date   ABDOMINAL HYSTERECTOMY  2007   fibroids, heavy bleeding   ARTERY BIOPSY Right 01/23/2019   Procedure: REMOVAL OF PART OF RIGHT TEMPORAL ARTERY FOR BIOPSY;  Surgeon: Michael Boston, MD;  Location: [facility];  Service: General;  Laterality: Right;   CATARACT EXTRACTION W/ INTRAOCULAR LENS IMPLANT Bilateral    COLONOSCOPY  04/2017   TA, diverticulosis, rpt ? ([facility])   GLAUCOMA SURGERY     HAMMER TOE SURGERY Bilateral    Right hip replacement     TONSILLECTOMY     TOTAL HIP ARTHROPLASTY Left 2021   TOTAL KNEE ARTHROPLASTY Right 2004   TKR (Dr. Eddie Dibbles with [facility])   TOTAL KNEE REVISION Right 03/2014   Dr Al [place] Right 2022     Home Medications:  Prior to Admission medications   Medication Sig Start Date End Date Taking? Authorizing Provider  acetaminophen (TYLENOL) 500 MG tablet Take 500 mg by mouth every 6 (six) hours as needed for moderate pain or headache.   Yes [provider]  ALPRAZolam (XANAX) 0.25 MG tablet TAKE 1 TABLET(0.25  MG) BY MOUTH AT BEDTIME AS NEEDED FOR SLEEP 12/04/19  Yes Biagio Borg, MD  aspirin EC 81 MG tablet Take 1 tablet (81 mg total) by mouth 2 (two) times daily. To be taken after surgery 07/08/21  Yes Aundra Dubin, PA-C  ciprofloxacin (CIPRO) 500 MG tablet Take 1 tablet (500 mg total) by mouth 2 (two) times daily for 6 days. 07/08/21 07/14/21 Yes Aundra Dubin, PA-C  Crisaborole (EUCRISA) 2 % OINT Apply 1 application topically daily as needed (Itchy skin). 06/23/21  Yes Padgett, Rae Halsted, MD  cycloSPORINE (RESTASIS) 0.05 % ophthalmic emulsion Place 1 drop into both eyes 2 (two) times daily as needed (dry eyes).   Yes [provider]   desonide (DESOWEN) 0.05 % ointment Apply 1 application topically 2 (two) times daily as needed. 07/05/21  Yes Padgett, Rae Halsted, MD  glipiZIDE (GLUCOTROL XL) 2.5 MG 24 hr tablet Take 2.5 mg by mouth daily with breakfast.   Yes [provider]  hydrochlorothiazide (HYDRODIURIL) 25 MG tablet Take 1 tablet (25 mg total) by mouth daily. Patient taking differently: Take 25 mg by mouth daily as needed (swelling). 05/07/20  Yes Yu, Amy V, PA-C  levocetirizine (XYZAL) 5 MG tablet Take 1 tablet (5 mg total) by mouth every evening. 06/23/21  Yes Padgett, Rae Halsted, MD  lisinopril (ZESTRIL) 20 MG tablet Take 1 tablet (20 mg total) by mouth daily. 05/11/21  Yes Angelica Pou, MD  MULTIPLE VITAMIN PO Take 1 tablet by mouth daily.   Yes [provider]  omeprazole (PRILOSEC) 20 MG capsule Take 20 mg by mouth daily as needed (heartburn).   Yes [provider]  TRULICITY 1.5 UX/3.2TF SOPN Inject 1.5 mg into the skin every Friday. 03/16/21  Yes [provider]  VYZULTA 0.024 % SOLN Place 1 drop into both eyes at bedtime. 05/16/21  Yes [provider]  Accu-Chek FastClix Lancets MISC USE TO TEST BLOOD SUGAR UP TO FOUR TIMES DAILY 11/25/18   Lance Sell, NP  ACCU-CHEK GUIDE test strip USE AS DIRECTED FOUR TIMES DAILY 06/28/20   Biagio Borg, MD  blood glucose meter kit and supplies KIT Dispense based on patient and insurance preference. Use up to four times daily as directed. DX Code: E11.9 02/11/18   Lance Sell, NP  docusate sodium (COLACE) 100 MG capsule Take 1 capsule (100 mg total) by mouth daily as needed. 07/08/21 07/08/22  Aundra Dubin, PA-C  HYDROcodone-acetaminophen (NORCO) 7.5-325 MG tablet Take 1-2 tablets by mouth every 6 (six) hours as needed for moderate pain. To be taken after surgery 07/08/21   Aundra Dubin, PA-C  hydroquinone 4 % cream Apply to darker areas of skin once a day as needed 07/07/21   Angelica Pou, MD   methocarbamol (ROBAXIN) 500 MG tablet Take 1 tablet (500 mg total) by mouth 2 (two) times daily as needed. To be taken after surgery 07/08/21   Aundra Dubin, PA-C  ondansetron (ZOFRAN) 4 MG tablet Take 1 tablet (4 mg total) by mouth every 8 (eight) hours as needed for nausea or vomiting. 07/08/21   Aundra Dubin, PA-C  sulfamethoxazole-trimethoprim (BACTRIM DS) 800-160 MG tablet Take 1 tablet by mouth 2 (two) times daily. To be taken after surgery 07/08/21   Aundra Dubin, PA-C    Inpatient Medications: Scheduled Meds:  acetaminophen  1,000 mg Oral Q6H   aspirin  81 mg Oral BID   ciprofloxacin  500 mg Oral BID  docusate sodium  100 mg Oral BID   [START ON 07/15/2021] Dulaglutide  1.5 mg Subcutaneous Q Fri   [START ON 07/12/2021] glipiZIDE  2.5 mg Oral Q breakfast   insulin aspart  0-15 Units Subcutaneous TID WC   ketorolac  15 mg Intravenous Q6H   methylPREDNISolone (SOLU-MEDROL) injection  40 mg Intravenous Daily   Continuous Infusions:  sodium chloride 75 mL/hr at 07/11/21 1549    ceFAZolin (ANCEF) IV 2 g (07/11/21 1746)   diphenhydrAMINE     famotidine (PEPCID) IV 20 mg (07/11/21 1827)   methocarbamol (ROBAXIN) IV     PRN Meds: [START ON 07/12/2021] acetaminophen, diphenhydrAMINE, hydrALAZINE, HYDROcodone-acetaminophen, HYDROmorphone (DILAUDID) injection, menthol-cetylpyridinium **OR** phenol, methocarbamol **OR** methocarbamol (ROBAXIN) IV, metoCLOPramide **OR** metoCLOPramide (REGLAN) injection, ondansetron **OR** ondansetron (ZOFRAN) IV  Allergies:    Allergies  Allergen Reactions   Codeine Other (See Comments)    palpitations   Metformin And Related Nausea Only   Other     Derma bond - contact dermatitis   Oxycodone Nausea And Vomiting    Social History:   Social History   Socioeconomic History   Marital status: Single    Spouse name: Not on file   Number of children: 0   Years of education: 16   Highest education level: Not on file  Occupational History    Occupation: work with disabilities/ Retired    Fish farm manager: Oceanographer  Tobacco Use   Smoking status: Never   Smokeless tobacco: Never  Vaping Use   Vaping Use: Never used  Substance and Sexual Activity   Alcohol use: No   Drug use: No   Sexual activity: Not Currently    Birth control/protection: Surgical  Other Topics Concern   Not on file  Social History Narrative   Caffeine: rare   Lives alone   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[employer]   Fun/Hobbies: Gardening    Social Determinants of Health   Financial Resource Strain: Not on file  Food Insecurity: Not on file  Transportation Needs: Not on file  Physical Activity: Not on file  Stress: Not on file  Social Connections: Not on file  Intimate Partner Violence: Not on file    Family History:    Family History  Problem Relation Age of Onset   Cancer Maternal Grandmother 69       ovarian   Diabetes Maternal Grandmother    Hypertension Maternal Grandmother    Cancer Mother 42       bone, MM   Stroke Other        unsure who   CAD Neg Hx    Colon cancer Neg Hx    Esophageal cancer Neg Hx    Rectal cancer Neg Hx    Stomach cancer Neg Hx   She did not know her father's history.  ROS:  Please see the history of present illness.   All other ROS reviewed and negative.     Physical Exam/Data:   Vitals:   07/11/21 1445 07/11/21 1515 07/11/21 1546 07/11/21 1900  BP: 126/80 127/79 128/87 133/78  Pulse: 86 88 92 92  Resp: 13 13  14   Temp:   97.6 F (36.4 C) 97.6 F (36.4 C)  TempSrc:   Oral Oral  SpO2: 97% 97% 98% 98%  Weight:      Height:        Intake/Output Summary (Last 24 hours) at 07/11/2021 2010 Last data filed at 07/11/2021 1746 Gross per 24 hour  Intake 1800  ml  Output 75 ml  Net 1725 ml   Filed Weights   07/11/21 0735  Weight: 90.3 kg   Body mass index is 33.12 kg/m.  GENERAL:  Weel appearing HEENT:   Pupils equal round and reactive, fundi not visualized, oral mucosa  unremarkable NECK:  No  jugular venous distention, waveform within normal limits, carotid upstroke brisk and symmetric, no bruits, no thyromegaly LYMPHATICS:  No cervical, inguinal adenopathy LUNGS:   Clear to auscultation bilaterally BACK:  No CVA tenderness CHEST:   Unremarkable HEART:  PMI not displaced or sustained,S1 and S2 within normal limits, no S3, no S4, no clicks, no rubs,  no murmurs ABD:  Flat, positive bowel sounds normal in frequency in pitch, no bruits, no rebound, no guarding, no midline pulsatile mass, no hepatomegaly, no splenomegaly EXT:  2 plus pulses throughout, no  edema, no cyanosis no clubbing SKIN:  No rashes no nodules NEURO:   Cranial nerves II through XII grossly intact, motor grossly intact throughout PSYCH:    Cognitively intact, oriented to person place and time   EKG:  The EKG was personally reviewed and demonstrates: Sinus rhythm, rate 93, axis within normal limits, intervals within normal limits, no acute ST-T wave changes.  Telemetry:  Telemetry was personally reviewed and demonstrates:  NA  Relevant CV Studies: None  Laboratory Data:  Chemistry Recent Labs  Lab 07/07/21 1140 07/11/21 1305  NA 140 136  K 3.6 3.6  CL 105 104  CO2 24 20*  GLUCOSE 84 235*  BUN 11 18  CREATININE 0.82 1.04*  CALCIUM 9.7 8.5*  GFRNONAA >60 58*  ANIONGAP 11 12    Recent Labs  Lab 07/07/21 1140 07/11/21 1305  PROT 8.0 6.0*  ALBUMIN 3.9 2.9*  AST 18 25  ALT 16 15  ALKPHOS 100 83  BILITOT 0.4 0.6   Hematology Recent Labs  Lab 07/07/21 1140  WBC 4.6  RBC 4.97  HGB 13.2  HCT 43.5  MCV 87.5  MCH 26.6  MCHC 30.3  RDW 15.4  PLT 343   Cardiac EnzymesNo results for input(s): TROPONINI in the last 168 hours. No results for input(s): TROPIPOC in the last 168 hours.  BNPNo results for input(s): BNP, PROBNP in the last 168 hours.  DDimer No results for input(s): DDIMER in the last 168 hours.  Radiology/Studies:  DG Chest Port 1 View  Result Date:  07/11/2021 CLINICAL DATA:  Hypotension EXAM: PORTABLE CHEST 1 VIEW COMPARISON:  05/28/2020 FINDINGS: Patient is slightly rotated. The heart size and mediastinal contours are within normal limits. No focal airspace consolidation, pleural effusion, or pneumothorax. Partially visualized reverse right shoulder arthroplasty hardware. The visualized skeletal structures are unremarkable. IMPRESSION: No active disease. Electronically Signed   By: Davina Poke D.O.   On: 07/11/2021 19:56   DG Knee Left Port  Result Date: 07/11/2021 CLINICAL DATA:  70 year old female status post left knee arthroplasty. EXAM: PORTABLE LEFT KNEE - 1-2 VIEW COMPARISON:  No priors. FINDINGS: Two views of the left knee demonstrate postoperative changes of total knee arthroplasty. The femoral and tibial components of the prosthesis appear well seated without definite periprosthetic fracture or other immediate complicating features. Gas is present in the joint space. IMPRESSION: 1. Expected postoperative findings of recent left total knee arthroplasty without immediate complicating features. Electronically Signed   By: Vinnie Langton M.D.   On: 07/11/2021 12:52    Assessment and Plan:   Hypotension: I do not suspect acute coronary syndrome.  She did have a  mild troponin elevation but there are no objective evidence of ischemia on EKG and she is not having any symptoms.  This troponin elevation is nonspecific.  We can repeat a troponin in the morning and check an echocardiogram.  However, likely no further cardiovascular testing would be needed.  DM:    Blood sugars well controlled.  No change in therapy. Lab Results  Component Value Date   HGBA1C 5.5 07/11/2021   HTN: I agree with holding her home meds today and restarting based on her blood pressure in the a.m.   For questions or updates, please contact [place] Please consult [email or web address] for contact info under Cardiology/STEMI.   Signed, Minus Breeding, MD   07/11/2021 8:10 PM

## 2021-07-11 NOTE — Discharge Instructions (Signed)

## 2021-07-11 NOTE — Op Note (Signed)
Total Knee Arthroplasty Procedure Note  Preoperative diagnosis: Left knee osteoarthritis  Postoperative diagnosis:same  Operative procedure: Left total knee arthroplasty. CPT [phone]  Surgeon: N. Eduard Roux, MD  Assist: Madalyn Rob, PA-C; necessary for the timely completion of procedure and due to complexity of procedure.  Anesthesia: general, adductor canal block, local  Tourniquet time: see anesthesia record  Implants used: Zimmer persona Femur: CR 6 Tibia: D Patella: 32 mm Polyethylene: 12 mm, MC  Indication: Melissa [name] is a 70 y.o. year old female with a history of knee pain. Having failed conservative management, the patient elected to proceed with a total knee arthroplasty.  We have reviewed the risk and benefits of the surgery and they elected to proceed after voicing understanding.  Procedure:  After informed consent was obtained and understanding of the risk were voiced including but not limited to bleeding, infection, damage to surrounding structures including nerves and vessels, blood clots, leg length inequality and the failure to achieve desired results, the operative extremity was marked with verbal confirmation of the patient in the holding area.   The patient was then brought to the operating room and transported to the operating room table in the supine position.  A tourniquet was applied to the operative extremity around the upper thigh. The operative limb was then prepped and draped in the usual sterile fashion and preoperative antibiotics were administered.  A time out was performed prior to the start of surgery confirming the correct extremity, preoperative antibiotic administration, as well as team members, implants and instruments available for the case. Correct surgical site was also confirmed with preoperative radiographs. The limb was then elevated for exsanguination and the tourniquet was inflated. A midline incision was made and a standard  medial parapatellar approach was performed.  The infrapatellar fat pad was removed.  Suprapatellar synovium was removed to reveal the anterior distal femoral cortex.  A medial peel was performed to release the capsule of the medial tibial plateau.  The patella was then everted and was prepared and sized to a 32 mm.  A cover was placed on the patella for protection from retractors.  The knee was then brought into full flexion and we then turned our attention to the femur.  There was severe wear on the lateral femoral condyle, medial tibial plateau.  The cruciates were sacrificed.  Start site was drilled in the femur and the intramedullary distal femoral cutting guide was placed, set at 5 degrees valgus, taking 10 mm of distal resection. The distal cut was made. Osteophytes were then removed.  Next, the proximal tibial cutting guide was placed with appropriate slope, varus/valgus alignment and depth of resection. The proximal tibial cut was made. Gap blocks were then used to assess the extension gap and alignment, and appropriate soft tissue releases were performed. Attention was turned back to the femur, which was sized using the sizing guide to a size 6. Appropriate rotation of the femoral component was determined using epicondylar axis, Whiteside's line, and assessing the flexion gap under ligament tension. The appropriate size 4-in-1 cutting block was placed and checked with an angel wing and cuts were made. Posterior femoral osteophytes and uncapped bone were then removed with the curved osteotome.  Trial components were placed, and stability was checked in full extension, mid-flexion, and deep flexion. Proper tibial rotation was determined and marked.  The patella tracked well without a lateral release.  The femoral lugs were then drilled. Trial components were then removed and tibial preparation  performed.  The tibia was sized for a size D component.   The bony surfaces were irrigated with a pulse lavage and  then dried. Bone cement was vacuum mixed on the back table, and the final components sized above were cemented into place.  Antibiotic irrigation was placed in the knee joint and soft tissues while the cement cured.  After cement had finished curing, excess cement was removed. The stability of the construct was re-evaluated throughout a range of motion and found to be acceptable. The trial liner was removed, the knee was copiously irrigated, and the knee was re-evaluated for any excess bone debris. The real polyethylene liner, 12 mm thick, was inserted and checked to ensure the locking mechanism had engaged appropriately. The tourniquet was deflated and hemostasis was achieved. The wound was irrigated with normal saline.  One gram of vancomycin powder was placed in the surgical bed.  Capsular closure was performed with a #1 vicryl, subcutaneous fat closed with a 0 vicryl suture, then subcutaneous tissue closed with interrupted 2.0 vicryl suture. The skin was then closed with a 2.0 nylon. A sterile dressing was applied.  The patient was awakened in the operating room and taken to recovery in stable condition. All sponge, needle, and instrument counts were correct at the end of the case.  [name] was necessary for opening, closing, retracting, limb positioning and overall facilitation and completion of the surgery.  Position: supine  Complications: none.  Time Out: performed   Drains/Packing: none  Estimated blood loss: minimal  Returned to Recovery Room: in good condition.   Antibiotics: yes   Mechanical VTE (DVT) Prophylaxis: sequential compression devices, TED thigh-high  Chemical VTE (DVT) Prophylaxis: aspirin  Fluid Replacement  Crystalloid: see anesthesia record Blood: none  FFP: none   Specimens Removed: 1 to pathology   Sponge and Instrument Count Correct? yes   PACU: portable radiograph - knee AP and Lateral   Plan/RTC: Return in 2 weeks for wound check.   Weight  Bearing/Load Lower Extremity: full   Implant Name Type Inv. Item Serial No. Manufacturer Lot No. LRB No. Used Action  STEM POLY PAT PLY 31M KNEE - [id number] Knees STEM POLY PAT PLY 31M KNEE  ZIMMER RECON(ORTH,TRAU,BIO,SG) [id number] Left 1 Implanted  FEMUR CMT CR STD SZ 6 LT KNEE - [id number] Joint FEMUR CMT CR STD SZ 6 LT KNEE  ZIMMER RECON(ORTH,TRAU,BIO,SG) [id number] Left 1 Implanted  TIBIA STEM 5 DEG SZ D L KNEE - [id number] Knees TIBIA STEM 5 DEG SZ D L KNEE  ZIMMER RECON(ORTH,TRAU,BIO,SG) [id number] Left 1 Implanted  CEMENT BONE REFOBACIN R1X40 [place] - [id number] Cement CEMENT BONE REFOBACIN R1X40 [place]  ZIMMER RECON(ORTH,TRAU,BIO,SG) [id number] Left 2 Implanted  COMP MED POLY AS PERS S6-7 12 - [id number] Joint COMP MED POLY AS PERS S6-7 12  ZIMMER RECON(ORTH,TRAU,BIO,SG) [id number] Left 1 Implanted    N. Eduard Roux, MD [facility] 10:56 AM

## 2021-07-11 NOTE — Consult Note (Signed)
[facility] Medical Consultation  Melissa [name] GTX:[id number] DOB: Jun 21, 1951 DOA: 07/11/2021 PCP: Angelica Pou, MD   Requesting physician: Dr. Erlinda Hong Date of consultation: 07/11/2021 Reason for consultation: Hypotension  Impression/Recommendations Principal Problem:   Primary osteoarthritis of left knee Active Problems:   Status post total left knee replacement   DJD (degenerative joint disease) of knee    Hypotension, OR suspected anaphylaxis reaction, given patient also developed concurrent rash on her right arm and right shoulder.  Patient received epinephrine injection in the OR, and blood pressure recovered.  Rash is significant improvement on right arm and shoulder.  Patient denies any trouble breathing, no tongue or lips swelling. -Will give short course of H1 and H2 blocker and Solu-Medrol 40 mg daily for 1 to 2 days. -Other DDX, check TSH, cortisol level, postop EKG and troponins. -Stepwise reintroduce home BP meds.  Restart HCTZ and lisinopril tomorrow.  As needed hydralazine for now. 2.  IIDM, resume glipizide. 3.  Left knee arthroplasty, pain control as per primary team, DVT prophylaxis. 4.  HTN, as above.  I will followup again tomorrow. Please contact me if I can be of assistance in the meanwhile. Thank you for this consultation.  Chief Complaint: Feeling ok  HPI:   70 year old female patient with past medical history of HTN, IIDM, morbid obesity, presented with elective left knee surgery.  Patient tolerated procedure well, was under general anesthesia, successfully extubated.  However postsurgery, patient developed temporary hypotension and a concurrent rash on right upper arm and right shoulder.  Epinephrine 1 mg IV push was performed in the OR.  And patient blood pressure recovers in a few minutes.  Patient does not recall what has happened, but denied any chest pain or shortness of breath, no cough or wheezing.  Denies any swelling of tongue or  lips no trouble breathing.  At baseline, she has allergies to oxycodone and codeine which caused feeling of nauseous vomit.  She never had anaphylaxis type of allergy before.  Review of Systems:  14 points of review system done negative except as mentioned HPI.  Past Medical History:  Diagnosis Date   Allergy    Anxiety    Arthritis    s/p R TKR   Cyst of left kidney 2015   by lumbar MRI pending renal US   DDD (degenerative disc disease), lumbar 03/2014   severe L2-3 with L HNP with L2/3 nerve root impingement [name])   Depression    Depression with anxiety    Diabetes type 2, controlled ([place]) 2011   borderline   Dyspnea    Glaucoma    History of ulcer disease    HLD (hyperlipidemia)    no meds taken   Hypertension    Plantar fasciitis of left foot 10/01/2013   PONV (postoperative nausea and vomiting)    Right-sided face pain 01/07/2019   Sleep apnea 2022   Ulcer    Past Surgical History:  Procedure Laterality Date   ABDOMINAL HYSTERECTOMY  2007   fibroids, heavy bleeding   ARTERY BIOPSY Right 01/23/2019   Procedure: REMOVAL OF PART OF RIGHT TEMPORAL ARTERY FOR BIOPSY;  Surgeon: Michael Boston, MD;  Location: [facility];  Service: General;  Laterality: Right;   CATARACT EXTRACTION W/ INTRAOCULAR LENS IMPLANT Bilateral    COLONOSCOPY  04/2017   TA, diverticulosis, rpt ? ([facility])   GLAUCOMA SURGERY     HAMMER TOE SURGERY Bilateral    Right hip replacement     TONSILLECTOMY  TOTAL HIP ARTHROPLASTY Left 2021   TOTAL KNEE ARTHROPLASTY Right 2004   TKR (Dr. Eddie Dibbles with [facility])   TOTAL KNEE REVISION Right 03/2014   Dr Al Corpus [facility]   TOTAL SHOULDER ARTHROPLASTY Right 2022   Social History:  reports that she has never smoked. She has never used smokeless tobacco. She reports that she does not drink alcohol and does not use drugs.  Allergies  Allergen Reactions   Codeine Other (See Comments)    palpitations   Metformin And Related Nausea Only   Other     Derma bond -  contact dermatitis   Oxycodone Nausea And Vomiting   Family History  Problem Relation Age of Onset   Cancer Maternal Grandmother 69       ovarian   Diabetes Maternal Grandmother    Hypertension Maternal Grandmother    Cancer Mother 19       bone, MM   Stroke Other        unsure who   CAD Neg Hx    Colon cancer Neg Hx    Esophageal cancer Neg Hx    Rectal cancer Neg Hx    Stomach cancer Neg Hx     Prior to Admission medications   Medication Sig Start Date End Date Taking? Authorizing Provider  acetaminophen (TYLENOL) 500 MG tablet Take 500 mg by mouth every 6 (six) hours as needed for moderate pain or headache.   Yes [provider]  ALPRAZolam (XANAX) 0.25 MG tablet TAKE 1 TABLET(0.25 MG) BY MOUTH AT BEDTIME AS NEEDED FOR SLEEP 12/04/19  Yes Biagio Borg, MD  aspirin EC 81 MG tablet Take 1 tablet (81 mg total) by mouth 2 (two) times daily. To be taken after surgery 07/08/21  Yes Aundra Dubin, PA-C  ciprofloxacin (CIPRO) 500 MG tablet Take 1 tablet (500 mg total) by mouth 2 (two) times daily for 6 days. 07/08/21 07/14/21 Yes Aundra Dubin, PA-C  Crisaborole (EUCRISA) 2 % OINT Apply 1 application topically daily as needed (Itchy skin). 06/23/21  Yes Padgett, Rae Halsted, MD  cycloSPORINE (RESTASIS) 0.05 % ophthalmic emulsion Place 1 drop into both eyes 2 (two) times daily as needed (dry eyes).   Yes [provider]  desonide (DESOWEN) 0.05 % ointment Apply 1 application topically 2 (two) times daily as needed. 07/05/21  Yes Padgett, Rae Halsted, MD  glipiZIDE (GLUCOTROL XL) 2.5 MG 24 hr tablet Take 2.5 mg by mouth daily with breakfast.   Yes [provider]  hydrochlorothiazide (HYDRODIURIL) 25 MG tablet Take 1 tablet (25 mg total) by mouth daily. Patient taking differently: Take 25 mg by mouth daily as needed (swelling). 05/07/20  Yes Yu, Amy V, PA-C  levocetirizine (XYZAL) 5 MG tablet Take 1 tablet (5 mg total) by mouth every evening. 06/23/21   Yes Padgett, Rae Halsted, MD  lisinopril (ZESTRIL) 20 MG tablet Take 1 tablet (20 mg total) by mouth daily. 05/11/21  Yes Angelica Pou, MD  MULTIPLE VITAMIN PO Take 1 tablet by mouth daily.   Yes [provider]  omeprazole (PRILOSEC) 20 MG capsule Take 20 mg by mouth daily as needed (heartburn).   Yes [provider]  TRULICITY 1.5 TX/7.7SF SOPN Inject 1.5 mg into the skin every Friday. 03/16/21  Yes [provider]  VYZULTA 0.024 % SOLN Place 1 drop into both eyes at bedtime. 05/16/21  Yes [provider]  Accu-Chek FastClix Lancets MISC USE TO TEST BLOOD SUGAR UP TO FOUR TIMES DAILY 11/25/18  Lance Sell, NP  ACCU-CHEK GUIDE test strip USE AS DIRECTED FOUR TIMES DAILY 06/28/20   Biagio Borg, MD  blood glucose meter kit and supplies KIT Dispense based on patient and insurance preference. Use up to four times daily as directed. DX Code: E11.9 02/11/18   Lance Sell, NP  docusate sodium (COLACE) 100 MG capsule Take 1 capsule (100 mg total) by mouth daily as needed. 07/08/21 07/08/22  Aundra Dubin, PA-C  HYDROcodone-acetaminophen (NORCO) 7.5-325 MG tablet Take 1-2 tablets by mouth every 6 (six) hours as needed for moderate pain. To be taken after surgery 07/08/21   Aundra Dubin, PA-C  hydroquinone 4 % cream Apply to darker areas of skin once a day as needed 07/07/21   Angelica Pou, MD  methocarbamol (ROBAXIN) 500 MG tablet Take 1 tablet (500 mg total) by mouth 2 (two) times daily as needed. To be taken after surgery 07/08/21   Aundra Dubin, PA-C  ondansetron (ZOFRAN) 4 MG tablet Take 1 tablet (4 mg total) by mouth every 8 (eight) hours as needed for nausea or vomiting. 07/08/21   Aundra Dubin, PA-C  sulfamethoxazole-trimethoprim (BACTRIM DS) 800-160 MG tablet Take 1 tablet by mouth 2 (two) times daily. To be taken after surgery 07/08/21   Aundra Dubin, PA-C   Physical Exam: Blood pressure 133/71, pulse 83,  temperature (!) 96.5 F (35.8 C), resp. rate 13, height 5' 5"  (1.651 m), weight 90.3 kg, SpO2 98 %. Vitals:   07/11/21 1235 07/11/21 1250  BP: (!) 152/86 133/71  Pulse: 79 83  Resp: 13 13  Temp:    SpO2: 100% 98%    General: No acute distress Eyes: PERRL ENT: No swelling of lips or tongue Neck: No JVD Cardiovascular: RRR, no murmurs no gallops no rubs Respiratory: Clear breathing, no wheezing no crackles. Abdomen: Soft, nontender nondistended Skin: Slight rash but no swelling on right upper arm and back of right shoulder, nontender, normal ROM. Musculoskeletal: ROM, left knee in surgical dressing Psychiatric: Calm Neurologic: No focal deficit  Labs on Admission:  Basic Metabolic Panel: Recent Labs  Lab 07/07/21 1140  NA 140  K 3.6  CL 105  CO2 24  GLUCOSE 84  BUN 11  CREATININE 0.82  CALCIUM 9.7   Liver Function Tests: Recent Labs  Lab 07/07/21 1140  AST 18  ALT 16  ALKPHOS 100  BILITOT 0.4  PROT 8.0  ALBUMIN 3.9   No results for input(s): LIPASE, AMYLASE in the last 168 hours. No results for input(s): AMMONIA in the last 168 hours. CBC: Recent Labs  Lab 07/07/21 1140  WBC 4.6  NEUTROABS 2.6  HGB 13.2  HCT 43.5  MCV 87.5  PLT 343   Cardiac Enzymes: No results for input(s): CKTOTAL, CKMB, CKMBINDEX, TROPONINI in the last 168 hours. BNP: Invalid input(s): POCBNP CBG: Recent Labs  Lab 07/07/21 1031 07/11/21 0738 07/11/21 1203  GLUCAP 90 83 184*    Radiological Exams on Admission: DG Knee Left Port  Result Date: 07/11/2021 CLINICAL DATA:  70 year old female status post left knee arthroplasty. EXAM: PORTABLE LEFT KNEE - 1-2 VIEW COMPARISON:  No priors. FINDINGS: Two views of the left knee demonstrate postoperative changes of total knee arthroplasty. The femoral and tibial components of the prosthesis appear well seated without definite periprosthetic fracture or other immediate complicating features. Gas is present in the joint space.  IMPRESSION: 1. Expected postoperative findings of recent left total knee arthroplasty without immediate complicating features. Electronically Signed  By: Vinnie Langton M.D.   On: 07/11/2021 12:52    EKG: Ordered  Time spent: 45 min  Lequita Halt [facility] Pager [phone] 07/11/2021, 1:07 PM

## 2021-07-11 NOTE — Progress Notes (Signed)
Trop 35>205  EKG no significant acute ST changes compared to baseline.  Discussed with on-call cardiology Dr. Percival Spanish, who will see the patient tonight. Echo and repeat trop in AM ordered.

## 2021-07-11 NOTE — Anesthesia Procedure Notes (Signed)
Procedure Name: Intubation Date/Time: 07/11/2021 11:25 AM Performed by: Trinna Post., CRNA Pre-anesthesia Checklist: Patient identified, Emergency Drugs available, Suction available, Patient being monitored and Timeout performed Patient Re-evaluated:Patient Re-evaluated prior to induction Oxygen Delivery Method: Circle system utilized Preoxygenation: Pre-oxygenation with 100% oxygen Induction Type: IV induction Laryngoscope Size: Glidescope and 3 Grade View: Grade I Tube type: Oral Tube size: 7.0 mm Number of attempts: 1 Airway Equipment and Method: Rigid stylet and Video-laryngoscopy Placement Confirmation: ETT inserted through vocal cords under direct vision, positive ETCO2 and breath sounds checked- equal and bilateral Secured at: 22 cm Tube secured with: Tape Dental Injury: Teeth and Oropharynx as per pre-operative assessment

## 2021-07-12 ENCOUNTER — Encounter (HOSPITAL_COMMUNITY): Payer: Self-pay | Admitting: Orthopaedic Surgery

## 2021-07-12 ENCOUNTER — Observation Stay (HOSPITAL_BASED_OUTPATIENT_CLINIC_OR_DEPARTMENT_OTHER): Payer: Medicare Other

## 2021-07-12 DIAGNOSIS — R778 Other specified abnormalities of plasma proteins: Secondary | ICD-10-CM | POA: Diagnosis not present

## 2021-07-12 DIAGNOSIS — M1712 Unilateral primary osteoarthritis, left knee: Secondary | ICD-10-CM | POA: Diagnosis not present

## 2021-07-12 DIAGNOSIS — R7989 Other specified abnormal findings of blood chemistry: Secondary | ICD-10-CM

## 2021-07-12 DIAGNOSIS — I361 Nonrheumatic tricuspid (valve) insufficiency: Secondary | ICD-10-CM | POA: Diagnosis not present

## 2021-07-12 DIAGNOSIS — I10 Essential (primary) hypertension: Secondary | ICD-10-CM | POA: Diagnosis not present

## 2021-07-12 LAB — CBC WITH DIFFERENTIAL/PLATELET
Abs Immature Granulocytes: 0.03 10*3/uL (ref 0.00–0.07)
Basophils Absolute: 0 10*3/uL (ref 0.0–0.1)
Basophils Relative: 0 %
Eosinophils Absolute: 0 10*3/uL (ref 0.0–0.5)
Eosinophils Relative: 0 %
HCT: 34.3 % — ABNORMAL LOW (ref 36.0–46.0)
Hemoglobin: 10.7 g/dL — ABNORMAL LOW (ref 12.0–15.0)
Immature Granulocytes: 0 %
Lymphocytes Relative: 6 %
Lymphs Abs: 0.6 10*3/uL — ABNORMAL LOW (ref 0.7–4.0)
MCH: 26.7 pg (ref 26.0–34.0)
MCHC: 31.2 g/dL (ref 30.0–36.0)
MCV: 85.5 fL (ref 80.0–100.0)
Monocytes Absolute: 0.5 10*3/uL (ref 0.1–1.0)
Monocytes Relative: 5 %
Neutro Abs: 8.6 10*3/uL — ABNORMAL HIGH (ref 1.7–7.7)
Neutrophils Relative %: 89 %
Platelets: 295 10*3/uL (ref 150–400)
RBC: 4.01 MIL/uL (ref 3.87–5.11)
RDW: 15.2 % (ref 11.5–15.5)
WBC: 9.7 10*3/uL (ref 4.0–10.5)
nRBC: 0 % (ref 0.0–0.2)

## 2021-07-12 LAB — ECHOCARDIOGRAM COMPLETE
AR max vel: 2.76 cm2
AV Peak grad: 12.1 mmHg
Ao pk vel: 1.74 m/s
Area-P 1/2: 5.23 cm2
Calc EF: 58.2 %
Height: 65 in
S' Lateral: 2.9 cm
Single Plane A2C EF: 58.9 %
Single Plane A4C EF: 59.3 %
Weight: 3184 oz

## 2021-07-12 LAB — BASIC METABOLIC PANEL
Anion gap: 8 (ref 5–15)
BUN: 15 mg/dL (ref 8–23)
CO2: 24 mmol/L (ref 22–32)
Calcium: 8.3 mg/dL — ABNORMAL LOW (ref 8.9–10.3)
Chloride: 106 mmol/L (ref 98–111)
Creatinine, Ser: 0.98 mg/dL (ref 0.44–1.00)
GFR, Estimated: >60 mL/min (ref >60)
Glucose, Bld: 163 mg/dL — ABNORMAL HIGH (ref 70–99)
Potassium: 3.6 mmol/L (ref 3.5–5.1)
Sodium: 138 mmol/L (ref 135–145)

## 2021-07-12 LAB — TROPONIN I (HIGH SENSITIVITY): Troponin I (High Sensitivity): 44 ng/L — ABNORMAL HIGH (ref <18)

## 2021-07-12 LAB — GLUCOSE, CAPILLARY
Glucose-Capillary: 168 mg/dL — ABNORMAL HIGH (ref 70–99)
Glucose-Capillary: 113 mg/dL — ABNORMAL HIGH (ref 70–99)
Glucose-Capillary: 127 mg/dL — ABNORMAL HIGH (ref 70–99)
Glucose-Capillary: 175 mg/dL — ABNORMAL HIGH (ref 70–99)
Glucose-Capillary: 111 mg/dL — ABNORMAL HIGH (ref 70–99)

## 2021-07-12 LAB — TRYPTASE: Tryptase: 36.3 ug/L — ABNORMAL HIGH (ref 2.2–13.2)

## 2021-07-12 MED ORDER — SODIUM CHLORIDE 0.9 % IV SOLN: INTRAVENOUS | Status: DC

## 2021-07-12 NOTE — Care Plan (Signed)
Ortho Bundle Case Management Note  Patient Details  Name: Melissa [name] MRN: [id number] Date of Birth: November 02, 1950  RNCM call to patient to discuss her upcoming Left total knee arthroplasty with Dr. Erlinda Hong. She is an ortho bundle patient through [facility] and is agreeable to case management. She lives alone, but will have a brother and neighbor helping her as needed. She received a CPM and walker at her home prior to surgery by [employer]. Anticipate HHPT will be needed after short hospital stay. Referral for [facility] after choice provided. Reviewed all post op care instructions. Will continue to follow for needs.                  DME Arranged:  3-N-1, Walker rolling DME Agency:  [employer]  HH Arranged:  PT HH Agency:  [facility]  Additional Comments: Please contact me with any questions of if this plan should need to change.  Jamse Arn, RN, BSN, [employer]  [phone] 07/12/2021, 8:55 AM

## 2021-07-12 NOTE — Progress Notes (Addendum)
PROGRESS NOTE  Melissa [name]  DOB: 12-31-1950  PCP: Angelica Pou, MD YCX:[id number]  DOA: 07/11/2021  LOS: 0 days  Hospital Day: 2  Chief complaint: Postop hypotension and rash  Brief narrative: Melissa [name] is a 70 y.o. female with PMH significant for DM2, HTN, HLD obesity, sleep apnea, anxiety/depression who underwent elective left knee arthroplasty on 11/7. Postprocedure, after extubation, patient developed transient hypotension in a concurrent rash on right upper arm and right shoulder.  She was suspected of having an anaphylactic reaction and hence given a push of 350 mcg IV epinephrine, vasopressin  after which blood pressure recovered. There were no EKG changes noted Hospitalist service and cardiology service was consulted postoperatively. Labs with mildly elevated troponin  See below for details  Subjective: Patient was seen and examined this morning.  Pleasant elderly African-American female.  Propped up in bed.  Not in distress.  No new symptoms.  She feels grateful for the level of care she is gotten.  Assessment/Plan: Suspected anaphylactic reaction -Patient had immediate postop transient hypotension concurrent rash on right upper arm and right shoulder. -Suspected to be part of anaphylactic reaction.  Unclear cause.   -3 improved quickly after dose of IV epinephrine. -Currently on a short course of antihistamines and Solu-Medrol daily.  Elevated troponin -Troponin was up and peaked at 205, trended down again. -Cardiology consult obtained.  No EKG changes.  No history of CAD. -Pending echocardiogram Recent Labs    07/11/21 1305 07/11/21 1655 07/12/21 0526  TROPONINIHS 35* 205* 44*   Essential hypertension -On HCTZ and lisinopril at home. -On hold for hypotension.  Continue to monitor BP.  Type 2 diabetes mellitus -A1c 5.5 on 11/7 -Home meds include glipizide 2.5 mg daily, Trulicity 1.5 mg weekly on Friday -Currently on on the same as the  sliding scale insulin. Recent Labs  Lab 07/11/21 0738 07/11/21 1203 07/11/21 1549 07/11/21 2026 07/12/21 0635  GLUCAP 83 184* 167* 179* 168*   Left knee arthroplasty -pain control and DVT prophylaxis per primary team -Norco, Robaxin  Anxiety/depression -Home meds include as needed Xanax  Mobility: Encourage ambulation.  PT eval pending Living condition: Lives at home by self Goals of care:   Code Status: Full Code  Nutritional status: Body mass index is 33.12 kg/m.     Diet:  Diet Order             Diet Carb Modified Fluid consistency: Thin; Room service appropriate? Yes  Diet effective now                  DVT prophylaxis:  SCDs Start: 07/11/21 1547   Antimicrobials: None right now Fluid: Normal saline at 75 mill per hour Family Communication: None at bedside  Status is: Observation  Remains inpatient appropriate because: Pending echocardiogram   Infusions:   sodium chloride 75 mL/hr at 07/11/21 1549   diphenhydrAMINE     famotidine (PEPCID) IV 20 mg (07/11/21 1827)   methocarbamol (ROBAXIN) IV      Scheduled Meds:  acetaminophen  1,000 mg Oral Q6H   aspirin  81 mg Oral BID   ciprofloxacin  500 mg Oral BID   docusate sodium  100 mg Oral BID   [START ON 07/15/2021] Dulaglutide  1.5 mg Subcutaneous Q Fri   glipiZIDE  2.5 mg Oral Q breakfast   insulin aspart  0-15 Units Subcutaneous TID WC   ketorolac  15 mg Intravenous Q6H   methylPREDNISolone (SOLU-MEDROL) injection  40 mg Intravenous Daily  PRN meds: acetaminophen, diphenhydrAMINE, hydrALAZINE, HYDROcodone-acetaminophen, HYDROmorphone (DILAUDID) injection, menthol-cetylpyridinium **OR** phenol, methocarbamol **OR** methocarbamol (ROBAXIN) IV, metoCLOPramide **OR** metoCLOPramide (REGLAN) injection, ondansetron **OR** ondansetron (ZOFRAN) IV   Antimicrobials: Anti-infectives (From admission, onward)    Start     Dose/Rate Route Frequency Ordered Stop   07/11/21 2000  ciprofloxacin (CIPRO)  tablet 500 mg        500 mg Oral 2 times daily 07/11/21 1547 07/18/21 1959   07/11/21 1700  ceFAZolin (ANCEF) IVPB 2g/100 mL premix        2 g 200 mL/hr over 30 Minutes Intravenous Every 6 hours 07/11/21 1547 07/11/21 2347   07/11/21 1000  vancomycin (VANCOCIN) powder  Status:  Discontinued          As needed 07/11/21 1000 07/11/21 1158   07/11/21 0745  ceFAZolin (ANCEF) IVPB 2g/100 mL premix        2 g 200 mL/hr over 30 Minutes Intravenous On call to O.R. 07/11/21 0733 07/11/21 0945       Objective: Vitals:   07/11/21 2028 07/12/21 0700  BP: 110/79 111/66  Pulse: 94 88  Resp: 16 18  Temp: 98.3 F (36.8 C) 98 F (36.7 C)  SpO2: 96% 97%    Intake/Output Summary (Last 24 hours) at 07/12/2021 1032 Last data filed at 07/11/2021 1746 Gross per 24 hour  Intake 1600 ml  Output 75 ml  Net 1525 ml   Filed Weights   07/11/21 0735  Weight: 90.3 kg   Weight change:  Body mass index is 33.12 kg/m.   Physical Exam: General exam: Pleasant, elderly African-American female.  Not in distress Skin: No rashes, lesions or ulcers. HEENT: Atraumatic, normocephalic, no obvious bleeding Lungs: Clear to auscultation bilaterally CVS: Regular rate and rhythm, no murmur GI/Abd soft, nontender, nondistended, bowel sound present CNS: Alert, awake, oriented x3 Psychiatry: Mood appropriate Extremities: No pedal edema, no calf tenderness.  Right shoulder with scar of rash that she had in June after shoulder surgery.  Data Review: I have personally reviewed the laboratory data and studies available.  F/u labs ordered Unresulted Labs (From admission, onward)     Start     Ordered   07/12/21 0500  CBC  Tomorrow morning,   R        07/11/21 1547   07/11/21 1205  Tryptase  Once,   R        07/11/21 1204            Signed, Terrilee Croak, MD [facility] 07/12/2021

## 2021-07-12 NOTE — Progress Notes (Addendum)
Progress Note  Patient Name: Melissa [name] Date of Encounter: 07/12/2021  Primary Cardiologist:   None   Subjective   No chest pain.  No SOB.    Inpatient Medications    Scheduled Meds:  acetaminophen  1,000 mg Oral Q6H   aspirin  81 mg Oral BID   ciprofloxacin  500 mg Oral BID   docusate sodium  100 mg Oral BID   [START ON 07/15/2021] Dulaglutide  1.5 mg Subcutaneous Q Fri   glipiZIDE  2.5 mg Oral Q breakfast   insulin aspart  0-15 Units Subcutaneous TID WC   ketorolac  15 mg Intravenous Q6H   methylPREDNISolone (SOLU-MEDROL) injection  40 mg Intravenous Daily   Continuous Infusions:  sodium chloride 75 mL/hr at 07/11/21 1549   diphenhydrAMINE     famotidine (PEPCID) IV 20 mg (07/11/21 1827)   methocarbamol (ROBAXIN) IV     PRN Meds: acetaminophen, diphenhydrAMINE, hydrALAZINE, HYDROcodone-acetaminophen, HYDROmorphone (DILAUDID) injection, menthol-cetylpyridinium **OR** phenol, methocarbamol **OR** methocarbamol (ROBAXIN) IV, metoCLOPramide **OR** metoCLOPramide (REGLAN) injection, ondansetron **OR** ondansetron (ZOFRAN) IV   Vital Signs    Vitals:   07/11/21 1546 07/11/21 1900 07/11/21 2028 07/12/21 0700  BP: 128/87 133/78 110/79 111/66  Pulse: 92 92 94 88  Resp:  14 16 18   Temp: 97.6 F (36.4 C) 97.6 F (36.4 C) 98.3 F (36.8 C) 98 F (36.7 C)  TempSrc: Oral Oral Oral Oral  SpO2: 98% 98% 96% 97%  Weight:      Height:        Intake/Output Summary (Last 24 hours) at 07/12/2021 1055 Last data filed at 07/11/2021 1746 Gross per 24 hour  Intake 600 ml  Output 75 ml  Net 525 ml   Filed Weights   07/11/21 0735  Weight: 90.3 kg    Telemetry    NSR - Personally Reviewed  ECG    NA - Personally Reviewed  Physical Exam   GEN: No acute distress.   Neck: No  JVD Cardiac: RRR, no murmurs, rubs, or gallops.  Respiratory: Clear  to auscultation bilaterally. GI: Soft, nontender, non-distended  MS: No  edema; No deformity. Neuro:  Nonfocal   Psych: Normal affect   Labs    Chemistry Recent Labs  Lab 07/07/21 1140 07/11/21 1305 07/12/21 0526  NA 140 136 138  K 3.6 3.6 3.6  CL 105 104 106  CO2 24 20* 24  GLUCOSE 84 235* 163*  BUN 11 18 15   CREATININE 0.82 1.04* 0.98  CALCIUM 9.7 8.5* 8.3*  PROT 8.0 6.0*  --   ALBUMIN 3.9 2.9*  --   AST 18 25  --   ALT 16 15  --   ALKPHOS 100 83  --   BILITOT 0.4 0.6  --   GFRNONAA >60 58* >60  ANIONGAP 11 12 8      Hematology Recent Labs  Lab 07/07/21 1140 07/12/21 0526  WBC 4.6 9.7  RBC 4.97 4.01  HGB 13.2 10.7*  HCT 43.5 34.3*  MCV 87.5 85.5  MCH 26.6 26.7  MCHC 30.3 31.2  RDW 15.4 15.2  PLT 343 295    Cardiac EnzymesNo results for input(s): TROPONINI in the last 168 hours. No results for input(s): TROPIPOC in the last 168 hours.   BNPNo results for input(s): BNP, PROBNP in the last 168 hours.   DDimer No results for input(s): DDIMER in the last 168 hours.   Radiology    DG Chest Port 1 View  Result Date: 07/11/2021 CLINICAL DATA:  Hypotension EXAM: PORTABLE CHEST 1 VIEW COMPARISON:  05/28/2020 FINDINGS: Patient is slightly rotated. The heart size and mediastinal contours are within normal limits. No focal airspace consolidation, pleural effusion, or pneumothorax. Partially visualized reverse right shoulder arthroplasty hardware. The visualized skeletal structures are unremarkable. IMPRESSION: No active disease. Electronically Signed   By: Davina Poke D.O.   On: 07/11/2021 19:56   DG Knee Left Port  Result Date: 07/11/2021 CLINICAL DATA:  70 year old female status post left knee arthroplasty. EXAM: PORTABLE LEFT KNEE - 1-2 VIEW COMPARISON:  No priors. FINDINGS: Two views of the left knee demonstrate postoperative changes of total knee arthroplasty. The femoral and tibial components of the prosthesis appear well seated without definite periprosthetic fracture or other immediate complicating features. Gas is present in the joint space. IMPRESSION: 1. Expected  postoperative findings of recent left total knee arthroplasty without immediate complicating features. Electronically Signed   By: Vinnie Langton M.D.   On: 07/11/2021 12:52    Cardiac Studies   Echo:  Pending.    Patient Profile     70 y.o. female without prior cardiac history who had a hypotensive episode post surgery.  Mildly elevated troponin.  Assessment & Plan    Elevated troponin:   Non diagnostic.  Trended back down.   Echo pending.  I don't anticipate any further cardiac work up if the echo is normal.    BP is a little low still so holding off on restarting HCTZ and lisinopril.   For questions or updates, please contact [facility] Please consult [email or web address] for contact info under Cardiology/STEMI.   Signed, Minus Breeding, MD  07/12/2021, 10:55 AM

## 2021-07-12 NOTE — Plan of Care (Signed)

## 2021-07-12 NOTE — Progress Notes (Signed)
Subjective: 1 Day Post-Op Procedure(s) (LRB): LEFT TOTAL KNEE ARTHROPLASTY (Left) Patient reports pain as mild.  Feeling well this am.  No chest pain/pressure/palpitations/sob.    Objective: Vital signs in last 24 hours: Temp:  [96.5 F (35.8 C)-98.3 F (36.8 C)] 98 F (36.7 C) (11/08 0700) Pulse Rate:  [79-94] 88 (11/08 0700) Resp:  [13-18] 18 (11/08 0700) BP: (109-175)/(66-92) 111/66 (11/08 0700) SpO2:  [96 %-100 %] 97 % (11/08 0700)  Intake/Output from previous day: 11/07 0701 - 11/08 0700 In: 1800 [P.O.:200; I.V.:1400; IV Piggyback:200] Out: 75 [Blood:75] Intake/Output this shift: No intake/output data recorded.  Recent Labs    07/12/21 0526  HGB 10.7*   Recent Labs    07/12/21 0526  WBC 9.7  RBC 4.01  HCT 34.3*  PLT 295   Recent Labs    07/11/21 1305 07/12/21 0526  NA 136 138  K 3.6 3.6  CL 104 106  CO2 20* 24  BUN 18 15  CREATININE 1.04* 0.98  GLUCOSE 235* 163*  CALCIUM 8.5* 8.3*   No results for input(s): LABPT, INR in the last 72 hours.  Neurologically intact Neurovascular intact Sensation intact distally Intact pulses distally Dorsiflexion/Plantar flexion intact Incision: dressing C/D/I No cellulitis present Compartment soft Bilateral upper extremity/chest/torso erythema has resolved   Assessment/Plan: 1 Day Post-Op Procedure(s) (LRB): LEFT TOTAL KNEE ARTHROPLASTY (Left) Advance diet Up with therapy Discharge home with home health over next few days.  Patient lives alone and will need to be medically stable prior to d/c.  Will also need to mobilize well with PT.  Hypotension from ? Anaphylactic reaction intra-op yesterday.  Tryptase ordered yesterday.  Hospitalist and cardiology on board.  Troponin elevated overnight, but appears to be trending  back down.   Ok to restart HCTZ and Lisinopril today as BP allows.  May restart hydralazine prn WBAT LLE ABLA- mild and stable     Anticipated LOS equal to or greater than 2 midnights due  to - Age 70 and older with one or more of the following:  - Obesity  - Expected need for hospital services (PT, OT, Nursing) required for safe  discharge  - Anticipated need for postoperative skilled nursing care or inpatient rehab  - Active co-morbidities: Diabetes OR   - Unanticipated findings during/Post Surgery: hypotension from ? Anaphylactic reaction during surgery   - Patient is a high risk of re-admission due to: Barriers to post-acute care (logistical, no family support in home)   Aundra Dubin 07/12/2021, 7:56 AM

## 2021-07-12 NOTE — Progress Notes (Signed)
PT Cancellation Note  Patient Details Name: Melissa [name] MRN: [id number] DOB: 03-15-1951   Cancelled Treatment:    Reason Eval/Treat Not Completed: Other (comment).  Two attempts to see pt in AM where she was with staff in care and could not get her visit done.  Follow up as pt allows.   Ramond Dial 07/12/2021, 5:59 PM  Mee Hives, PT PhD Acute Rehab Dept. Number: [place] [facility] [place]

## 2021-07-12 NOTE — Evaluation (Signed)
Occupational Therapy Evaluation Patient Details Name: Melissa [name] MRN: [id number] DOB: [birth year] Today's Date: 07/12/2021   History of Present Illness Pt is 69 yo female s/p L TKA on 07/11/21.  Post-op she did have a rash and hypotension -suspected allergic reaction , resolved with epinephrine injection.  Pt with hx of arthritis, DDD, DM2, HLD, HTN, hammer toe sx bil; bil THA; R TKA and R TKA revision, R TSA summer 2022.   Clinical Impression   Pt is independent at baseline with ADLs and functional mobility. Lives alone, but has her brother, neighbor, and cousin nearby who can provide assistance as needed. Pt min guard for bed mobility and transfers, completed UE and LE dressing with set up A, requiring a few verbal cues for LE dressing compensatory strategies. Pt states she has sock aid and reacher at home to use, along with BSC. Pt completed simulated toileting/clothing mgmt, required min guard, min v/c to reach back for arm of chair and sit slowly. Educated pt on shower transfer using BSC as seat, pt verbalized understanding. Pt currently limited by decreased ROM and impaired balance at this time, however OT needs can be met at next venue of care, will s/o. Pt to benefit from d/c home with assistance and HHOT.      Recommendations for follow up therapy are one component of a multi-disciplinary discharge planning process, led by the attending physician.  Recommendations may be updated based on patient status, additional functional criteria and insurance authorization.   Follow Up Recommendations  Home health OT    Assistance Recommended at Discharge Intermittent Supervision/Assistance  Functional Status Assessment  Patient has had a recent decline in their functional status and demonstrates the ability to make significant improvements in function in a reasonable and predictable amount of time.  Equipment Recommendations  None recommended by OT    Recommendations for Other Services PT  consult     Precautions / Restrictions Precautions Precautions: Fall Restrictions Weight Bearing Restrictions: Yes LLE Weight Bearing: Weight bearing as tolerated      Mobility Bed Mobility Overal bed mobility: Needs Assistance Bed Mobility: Supine to Sit     Supine to sit: Min guard;HOB elevated          Transfers Overall transfer level: Needs assistance Equipment used: Rolling walker (2 wheels) Transfers: Bed to chair/wheelchair/BSC;Sit to/from Stand Sit to Stand: Supervision Stand pivot transfers: From elevated surface;Min guard                Balance Overall balance assessment: Needs assistance Sitting-balance support: No upper extremity supported Sitting balance-Leahy Scale: Normal     Standing balance support: Bilateral upper extremity supported Standing balance-Leahy Scale: Fair                             ADL either performed or assessed with clinical judgement   ADL Overall ADL's : Needs assistance/impaired Eating/Feeding: Set up;Sitting   Grooming: Set up;Sitting   Upper Body Bathing: Set up;Sitting   Lower Body Bathing: Minimal assistance;Sitting/lateral leans   Upper Body Dressing : Set up;Sitting Upper Body Dressing Details (indicate cue type and reason): able to don gown around backside when seated in chair Lower Body Dressing: Set up;Sit to/from stand Lower Body Dressing Details (indicate cue type and reason): able to don pants reaching down to floor, educated pt on compensatory strategies for donning pants sitting EOB Toilet Transfer: Min guard;Rolling walker (2 wheels) Toilet Transfer Details (indicate cue type and  reason): simulated toilet transfer, pt requires v/c to reach back for arm of chair and sit slowly Toileting- Water quality scientist and Hygiene: Min guard;Sit to/from stand Toileting - Clothing Manipulation Details (indicate cue type and reason): able to pull up pants when moving from sit to stand     Functional  mobility during ADLs: Min guard General ADL Comments: Educated pt on shower transfer, using BSC as shower chair. Pt verbalizes understanding, says she may plan to wash sinkside if assistance is not available     Vision   Vision Assessment?: No apparent visual deficits     Perception     Praxis      Pertinent Vitals/Pain Pain Assessment: Faces Pain Score: 2  Faces Pain Scale: Hurts a little bit Pain Location: L knee Pain Descriptors / Indicators: Aching;Discomfort Pain Intervention(s): Limited activity within patient's tolerance;Monitored during session;Ice applied;Repositioned     Hand Dominance     Extremity/Trunk Assessment Upper Extremity Assessment Upper Extremity Assessment: Overall WFL for tasks assessed   Lower Extremity Assessment Lower Extremity Assessment: Defer to PT evaluation   Cervical / Trunk Assessment Cervical / Trunk Assessment: Normal   Communication Communication Communication: No difficulties   Cognition Arousal/Alertness: Awake/alert Behavior During Therapy: Anxious Overall Cognitive Status: Within Functional Limits for tasks assessed                                       General Comments       Exercises     Shoulder Instructions      Home Living Family/patient expects to be discharged to:: Private residence Living Arrangements: Alone Available Help at Discharge: Family;Neighbor (brother, neighbor, & cousin can help anytime) Type of Home: House Home Access: Level entry;Other (comment) (small step to enter)     Home Layout: One level     Bathroom Shower/Tub: Teacher, early years/pre: Standard Bathroom Accessibility: Yes   Home Equipment: Grab bars - tub/shower;Cane - single Barista (2 wheels);BSC/3in1   Additional Comments: retired, 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 Pt also notes she has sock aid and reacher from past hip replacement at home       Prior Functioning/Environment Prior Level of Function : Independent/Modified Independent;Driving             Mobility Comments: Uses a cane at times; can ambulate in community ADLs Comments: Independent with aDLs and IADLs        OT Problem List: Decreased range of motion;Pain;Impaired balance (sitting and/or standing)      OT Treatment/Interventions:      OT Goals(Current goals can be found in the care plan section) Acute Rehab OT Goals Patient Stated Goal: return home OT Goal Formulation: With patient Time For Goal Achievement: 07/26/21 Potential to Achieve Goals: Good  OT Frequency:     Barriers to D/C:            Co-evaluation              AM-PAC OT "6 Clicks" Daily Activity     Outcome Measure Help from another person eating meals?: None Help from another person taking care of personal grooming?: None Help from another person toileting, which includes using toliet, bedpan, or urinal?: A Little Help from another person bathing (including washing, rinsing, drying)?: A Little Help from another person to put on and taking off regular upper  body clothing?: None Help from another person to put on and taking off regular lower body clothing?: A Little 6 Click Score: 21   End of Session    Activity Tolerance:   Patient left:    OT Visit Diagnosis: Unsteadiness on feet (R26.81);Other abnormalities of gait and mobility (R26.89);Pain;Muscle weakness (generalized) (M62.81)                Time: 3388-2666 OT Time Calculation (min): 37 min Charges:  OT General Charges $OT Visit: 1 Visit OT Evaluation $OT Eval Low Complexity: 1 Low OT Treatments $Self Care/Home Management : 8-22 mins  Lynnda Child, OTD, OTR/L Acute Rehab [phone]) [phone] - [place] 07/12/2021, 3:17 PM

## 2021-07-12 NOTE — Progress Notes (Signed)
Asked by Dr. Percival Spanish to f/u echo result. 2D echo:   1. Normal left ventricular relaxation parameters. "Impaired relaxation"  pattern is due to low left atrial pressure (hypovolemia). Left ventricular  ejection fraction, by estimation, is 70 to 75%. The left ventricle has  hyperdynamic function. The left  ventricle has no regional wall motion abnormalities. Left ventricular  diastolic parameters were normal.   2. Right ventricular systolic function is normal. The right ventricular  size is normal. There is normal pulmonary artery systolic pressure.   3. The mitral valve is normal in structure. No evidence of mitral valve  regurgitation. No evidence of mitral stenosis.   4. The aortic valve is normal in structure. Aortic valve regurgitation is  not visualized. No aortic stenosis is present.   5. The inferior vena cava is small/collapsed, consistent with low right  atrial pressure.   To summarize, EF normal but echo c/w hypovolemia. Relayed results to IM as she may need additional IV fluids and to follow anemia; will defer to their management. Otherwise no significant findings seen warranting additional cardiac testing at this time. I relayed results to pt via phone.

## 2021-07-12 NOTE — Progress Notes (Signed)
Physical Therapy Treatment Patient Details Name: Melissa [name] MRN: [id number] DOB: 30-May-1951 Today's Date: 07/12/2021   History of Present Illness Pt is 70 yo female s/p L TKA on 07/11/21.  Post-op she did have a rash and hypotension -suspected allergic reaction , resolved with epinephrine injection.  Pt with hx of arthritis, DDD, DM2, HLD, HTN, hammer toe sx bil; bil THA; R TKA and R TKA revision, R TSA summer 2022.    PT Comments    Pt was seen for ROM and gait on hallway with RW, and note her tolerance for WB on LLE continues to be a struggle.  Replenished her ice in [facility] care to increase safety of movement with pain management but is reluctant to let PT stretch L knee to flex.  Continue to encourage this as tolerated and get her to walk on stairs as needed for home.  MD plan is in place for discharge, and recommendations from rehab are below.   Recommendations for follow up therapy are one component of a multi-disciplinary discharge planning process, led by the attending physician.  Recommendations may be updated based on patient status, additional functional criteria and insurance authorization.  Follow Up Recommendations  Follow physician's recommendations for discharge plan and follow up therapies     Assistance Recommended at Discharge Frequent or constant Supervision/Assistance  Equipment Recommendations  None recommended by PT    Recommendations for Other Services       Precautions / Restrictions Precautions Precautions: Fall Restrictions Weight Bearing Restrictions: Yes LLE Weight Bearing: Weight bearing as tolerated     Mobility  Bed Mobility Overal bed mobility: Needs Assistance Bed Mobility: Supine to Sit;Sit to Supine     Supine to sit: Min guard Sit to supine: Min assist   General bed mobility comments: needed assistance to get LLE onto bed    Transfers Overall transfer level: Needs assistance Equipment used: Rolling walker (2 wheels);1 person hand  held assist Transfers: Bed to chair/wheelchair/BSC;Sit to/from Stand Sit to Stand: Supervision           General transfer comment: requires cues for hand placement    Ambulation/Gait Ambulation/Gait assistance: Min assist Gait Distance (Feet): 120 Feet Assistive device: Rolling walker (2 wheels) Gait Pattern/deviations: Step-to pattern;Step-through pattern;Decreased stride length Gait velocity: decreased Gait velocity interpretation: <1.31 ft/sec, indicative of household ambulator Pre-gait activities: wgt shift and sidesteps General Gait Details: minimizing LLE WB stance time   Chief Strategy Officer    Modified Rankin (Stroke Patients Only)       Balance Overall balance assessment: Needs assistance Sitting-balance support: Feet supported Sitting balance-Leahy Scale: Normal     Standing balance support: Bilateral upper extremity supported Standing balance-Leahy Scale: Fair                              Cognition Arousal/Alertness: Awake/alert Behavior During Therapy: WFL for tasks assessed/performed Overall Cognitive Status: Within Functional Limits for tasks assessed                                          Exercises Total Joint Exercises Heel Slides: AROM;AAROM;Left;15 reps    General Comments General comments (skin integrity, edema, etc.): Instructed pt on sequence to stand and walk with help to determine limits and to stand upright, gauge  walker distance and to roll not lift it      Pertinent Vitals/Pain Pain Assessment: Faces Faces Pain Scale: Hurts little more Pain Location: L knee Pain Descriptors / Indicators: Grimacing;Guarding;Tightness Pain Intervention(s): Monitored during session;Premedicated before session;Repositioned    Home Living                          Prior Function            PT Goals (current goals can now be found in the care plan section) Acute Rehab PT  Goals Patient Stated Goal: return home Progress towards PT goals: Progressing toward goals    Frequency    7X/week      PT Plan Current plan remains appropriate    Co-evaluation              AM-PAC PT "6 Clicks" Mobility   Outcome Measure  Help needed turning from your back to your side while in a flat bed without using bedrails?: A Little Help needed moving from lying on your back to sitting on the side of a flat bed without using bedrails?: A Little Help needed moving to and from a bed to a chair (including a wheelchair)?: A Lot Help needed standing up from a chair using your arms (e.g., wheelchair or bedside chair)?: A Little Help needed to walk in hospital room?: A Lot Help needed climbing 3-5 steps with a railing? : A Lot 6 Click Score: 15    End of Session Equipment Utilized During Treatment: Gait belt Activity Tolerance: Patient limited by pain Patient left: in bed;with call bell/phone within reach;with bed alarm set Nurse Communication: Mobility status PT Visit Diagnosis: Other abnormalities of gait and mobility (R26.89);Muscle weakness (generalized) (M62.81);Pain Pain - Right/Left: Left Pain - part of body: Knee     Time: 3710-6269 PT Time Calculation (min) (ACUTE ONLY): 28 min  Charges:  $Gait Training: 8-22 mins $Therapeutic Exercise: 8-22 mins           Ramond Dial 07/12/2021, 6:07 PM   Mee Hives, PT PhD Acute Rehab Dept. Number: [place] [facility] [place]

## 2021-07-12 NOTE — Anesthesia Postprocedure Evaluation (Signed)
Anesthesia Post Note  Patient: Melissa [name]  Procedure(s) Performed: LEFT TOTAL KNEE ARTHROPLASTY (Left: Knee)     Patient location during evaluation: PACU Anesthesia Type: Regional Level of consciousness: awake and alert Pain management: pain level controlled Vital Signs Assessment: post-procedure vital signs reviewed and stable Respiratory status: spontaneous breathing, nonlabored ventilation, respiratory function stable and patient connected to nasal cannula oxygen Cardiovascular status: blood pressure returned to baseline and stable Postop Assessment: no apparent nausea or vomiting Anesthetic complications: no Comments: Please see intraoperative notes.  Intraoperatively, after tourniquet was deflated, pt became gradually hypotensive. Hypotension did not respond to phenylephrine or ephedrine. Epinephrine and vasopressin required. Also during this time, oxygen saturations decreased to mid 80s and EtCO2 decreased to high 20s. LMA removed and pt intubated without complication. Oxygen saturations and EtCO2 increased appropriately. Drapes were removed and a diffuse rash was noted over arms and chest. Unclear when rash appeared. Steroids and benadryl administered. Only new medication administered around this time was dilaudid administered 15 minutes before tourniquet release. Pt monitored in OR. Vital signs returned to baseline. Pt extubated in the OR without complication. Differential diagnosis includes hypotension 2/2 tourniquet release, allergic reaction, or an embolic event. Tryptase sent in PACU. Pt discharged from PACU in stable condition.    No notable events documented.  Last Vitals:  Vitals:   07/11/21 2028 07/12/21 0700  BP: 110/79 111/66  Pulse: 94 88  Resp: 16 18  Temp: 36.8 C 36.7 C  SpO2: 96% 97%    Last Pain:  Vitals:   07/12/21 0700  TempSrc: Oral  PainSc:                  [name] L Carlen Fils

## 2021-07-13 DIAGNOSIS — R7989 Other specified abnormal findings of blood chemistry: Secondary | ICD-10-CM | POA: Diagnosis not present

## 2021-07-13 DIAGNOSIS — I10 Essential (primary) hypertension: Secondary | ICD-10-CM | POA: Diagnosis not present

## 2021-07-13 DIAGNOSIS — M1712 Unilateral primary osteoarthritis, left knee: Secondary | ICD-10-CM | POA: Diagnosis not present

## 2021-07-13 LAB — GLUCOSE, CAPILLARY
Glucose-Capillary: 75 mg/dL (ref 70–99)
Glucose-Capillary: 108 mg/dL — ABNORMAL HIGH (ref 70–99)
Glucose-Capillary: 154 mg/dL — ABNORMAL HIGH (ref 70–99)
Glucose-Capillary: 111 mg/dL — ABNORMAL HIGH (ref 70–99)

## 2021-07-13 MED ORDER — HYDROCHLOROTHIAZIDE 25 MG PO TABS
25.0000 mg | ORAL_TABLET | Freq: Every day | ORAL | Status: DC
  Administered 2021-07-13: 25 mg via ORAL
  Filled 2021-07-13: qty 1

## 2021-07-13 MED ORDER — LISINOPRIL 20 MG PO TABS
20.0000 mg | ORAL_TABLET | Freq: Every day | ORAL | Status: DC
  Administered 2021-07-13: 20 mg via ORAL
  Filled 2021-07-13: qty 1

## 2021-07-13 NOTE — Progress Notes (Signed)
Progress Note  Patient Name: Melissa [name] Date of Encounter: 07/13/2021  Primary Cardiologist:   None   Subjective   No chest pain.  No SOB.    Inpatient Medications    Scheduled Meds:  aspirin  81 mg Oral BID   ciprofloxacin  500 mg Oral BID   docusate sodium  100 mg Oral BID   [START ON 07/15/2021] Dulaglutide  1.5 mg Subcutaneous Q Fri   glipiZIDE  2.5 mg Oral Q breakfast   insulin aspart  0-15 Units Subcutaneous TID WC   methylPREDNISolone (SOLU-MEDROL) injection  40 mg Intravenous Daily   Continuous Infusions:  sodium chloride 75 mL/hr at 07/12/21 1601   diphenhydrAMINE     famotidine (PEPCID) IV 20 mg (07/12/21 1603)   methocarbamol (ROBAXIN) IV     PRN Meds: acetaminophen, diphenhydrAMINE, hydrALAZINE, HYDROcodone-acetaminophen, HYDROmorphone (DILAUDID) injection, menthol-cetylpyridinium **OR** phenol, methocarbamol **OR** methocarbamol (ROBAXIN) IV, metoCLOPramide **OR** metoCLOPramide (REGLAN) injection, ondansetron **OR** ondansetron (ZOFRAN) IV   Vital Signs    Vitals:   07/12/21 1500 07/12/21 1941 07/13/21 0524 07/13/21 0755  BP: 128/78 123/77 (!) 152/88 (!) 105/91  Pulse: 87 88 83 83  Resp: 17 13 17 17   Temp: 98 F (36.7 C) 98.6 F (37 C) 99.2 F (37.3 C) 98.4 F (36.9 C)  TempSrc: Oral   Oral  SpO2: 99% 98% 98%   Weight:      Height:        Intake/Output Summary (Last 24 hours) at 07/13/2021 1154 Last data filed at 07/12/2021 1500 Gross per 24 hour  Intake 240 ml  Output --  Net 240 ml   Filed Weights   07/11/21 0735  Weight: 90.3 kg    Telemetry    NSR - Personally Reviewed  ECG    NA - Personally Reviewed  Physical Exam   GEN: No acute distress.   Neck: No  JVD Cardiac: RRR, no murmurs, rubs, or gallops.  Respiratory: Clear  to auscultation bilaterally. GI: Soft, nontender, non-distended  MS: No  edema; No deformity. Neuro:  Nonfocal  Psych: Normal affect   Labs    Chemistry Recent Labs  Lab 07/07/21 1140  07/11/21 1305 07/12/21 0526  NA 140 136 138  K 3.6 3.6 3.6  CL 105 104 106  CO2 24 20* 24  GLUCOSE 84 235* 163*  BUN 11 18 15   CREATININE 0.82 1.04* 0.98  CALCIUM 9.7 8.5* 8.3*  PROT 8.0 6.0*  --   ALBUMIN 3.9 2.9*  --   AST 18 25  --   ALT 16 15  --   ALKPHOS 100 83  --   BILITOT 0.4 0.6  --   GFRNONAA >60 58* >60  ANIONGAP 11 12 8      Hematology Recent Labs  Lab 07/07/21 1140 07/12/21 0526  WBC 4.6 9.7  RBC 4.97 4.01  HGB 13.2 10.7*  HCT 43.5 34.3*  MCV 87.5 85.5  MCH 26.6 26.7  MCHC 30.3 31.2  RDW 15.4 15.2  PLT 343 295    Cardiac EnzymesNo results for input(s): TROPONINI in the last 168 hours. No results for input(s): TROPIPOC in the last 168 hours.   BNPNo results for input(s): BNP, PROBNP in the last 168 hours.   DDimer No results for input(s): DDIMER in the last 168 hours.   Radiology    DG Chest Port 1 View  Result Date: 07/11/2021 CLINICAL DATA:  Hypotension EXAM: PORTABLE CHEST 1 VIEW COMPARISON:  05/28/2020 FINDINGS: Patient is slightly rotated. The  heart size and mediastinal contours are within normal limits. No focal airspace consolidation, pleural effusion, or pneumothorax. Partially visualized reverse right shoulder arthroplasty hardware. The visualized skeletal structures are unremarkable. IMPRESSION: No active disease. Electronically Signed   By: Davina Poke D.O.   On: 07/11/2021 19:56   DG Knee Left Port  Result Date: 07/11/2021 CLINICAL DATA:  70 year old female status post left knee arthroplasty. EXAM: PORTABLE LEFT KNEE - 1-2 VIEW COMPARISON:  No priors. FINDINGS: Two views of the left knee demonstrate postoperative changes of total knee arthroplasty. The femoral and tibial components of the prosthesis appear well seated without definite periprosthetic fracture or other immediate complicating features. Gas is present in the joint space. IMPRESSION: 1. Expected postoperative findings of recent left total knee arthroplasty without immediate  complicating features. Electronically Signed   By: Vinnie Langton M.D.   On: 07/11/2021 12:52   ECHOCARDIOGRAM COMPLETE  Result Date: 07/12/2021    ECHOCARDIOGRAM REPORT   Patient Name:   Melissa [name] Date of Exam: 07/12/2021 Medical Rec #:  [id number]         Height:       65.0 in Accession #:    [id number]        Weight:       199.0 lb Date of Birth:  August 10, 1951         BSA:          1.974 m Patient Age:    70 years          BP:           128/87 mmHg Patient Gender: F                 HR:           93 bpm. Exam Location:  Inpatient Procedure: 2D Echo, Color Doppler and Cardiac Doppler Indications:    Elevated troponin  History:        Patient has prior history of Echocardiogram examinations. Risk                 Factors:Hypertension, Diabetes and Sleep Apnea.  Sonographer:    Jyl Heinz Referring Phys: [id number] [place]  1. Normal left ventricular relaxation parameters. "Impaired relaxation" pattern is due to low left atrial pressure (hypovolemia). Left ventricular ejection fraction, by estimation, is 70 to 75%. The left ventricle has hyperdynamic function. The left ventricle has no regional wall motion abnormalities. Left ventricular diastolic parameters were normal.  2. Right ventricular systolic function is normal. The right ventricular size is normal. There is normal pulmonary artery systolic pressure.  3. The mitral valve is normal in structure. No evidence of mitral valve regurgitation. No evidence of mitral stenosis.  4. The aortic valve is normal in structure. Aortic valve regurgitation is not visualized. No aortic stenosis is present.  5. The inferior vena cava is small/collapsed, consistent with low right atrial pressure. FINDINGS  Left Ventricle: Normal left ventricular relaxation parameters. "Impaired relaxation" pattern is due to low left atrial pressure (hypovolemia). Left ventricular ejection fraction, by estimation, is 70 to 75%. The left ventricle has hyperdynamic  function.  The left ventricle has no regional wall motion abnormalities. The left ventricular internal cavity size was normal in size. There is no left ventricular hypertrophy. Left ventricular diastolic parameters were normal. Right Ventricle: The right ventricular size is normal. No increase in right ventricular wall thickness. Right ventricular systolic function is normal. There is normal pulmonary artery systolic pressure. The tricuspid regurgitant  velocity is 2.63 m/s, and  with an assumed right atrial pressure of 0 mmHg, the estimated right ventricular systolic pressure is 16.1 mmHg. Left Atrium: Left atrial size was normal in size. Right Atrium: Right atrial size was normal in size. Pericardium: There is no evidence of pericardial effusion. Mitral Valve: The mitral valve is normal in structure. No evidence of mitral valve regurgitation. No evidence of mitral valve stenosis. Tricuspid Valve: The tricuspid valve is normal in structure. Tricuspid valve regurgitation is mild . No evidence of tricuspid stenosis. Aortic Valve: The aortic valve is normal in structure. Aortic valve regurgitation is not visualized. No aortic stenosis is present. Aortic valve peak gradient measures 12.1 mmHg. Pulmonic Valve: The pulmonic valve was normal in structure. Pulmonic valve regurgitation is not visualized. No evidence of pulmonic stenosis. Aorta: The aortic root is normal in size and structure. Venous: The inferior vena cava is small/collapsed, consistent with low right atrial pressure. IAS/Shunts: No atrial level shunt detected by color flow Doppler.  LEFT VENTRICLE PLAX 2D LVIDd:         4.00 cm     Diastology LVIDs:         2.90 cm     LV e' medial:    9.25 cm/s LV PW:         0.90 cm     LV E/e' medial:  10.2 LV IVS:        0.70 cm     LV e' lateral:   11.30 cm/s LVOT diam:     2.00 cm     LV E/e' lateral: 8.3 LV SV:         89 LV SV Index:   45 LVOT Area:     3.14 cm  LV Volumes (MOD) LV vol d, MOD A2C: 77.1 ml LV vol d,  MOD A4C: 81.3 ml LV vol s, MOD A2C: 31.7 ml LV vol s, MOD A4C: 33.1 ml LV SV MOD A2C:     45.4 ml LV SV MOD A4C:     81.3 ml LV SV MOD BP:      46.7 ml RIGHT VENTRICLE             IVC RV Basal diam:  3.20 cm     IVC diam: 1.40 cm RV Mid diam:    2.60 cm RV S prime:     19.50 cm/s TAPSE (M-mode): 3.2 cm LEFT ATRIUM             Index        RIGHT ATRIUM           Index LA diam:        3.40 cm 1.72 cm/m   RA Area:     12.90 cm LA Vol (A2C):   43.1 ml 21.83 ml/m  RA Volume:   29.30 ml  14.84 ml/m LA Vol (A4C):   44.7 ml 22.65 ml/m LA Biplane Vol: 48.0 ml 24.32 ml/m  AORTIC VALVE AV Area (Vmax): 2.76 cm AV Vmax:        174.00 cm/s AV Peak Grad:   12.1 mmHg LVOT Vmax:      153.00 cm/s LVOT Vmean:     107.000 cm/s LVOT VTI:       0.282 m  AORTA Ao Root diam: 2.90 cm Ao Asc diam:  3.30 cm MITRAL VALVE               TRICUSPID VALVE MV Area (PHT): 5.23 cm    TR Peak  grad:   27.7 mmHg MV Decel Time: 145 msec    TR Vmax:        263.00 cm/s MV E velocity: 94.00 cm/s MV A velocity: 96.70 cm/s  SHUNTS MV E/A ratio:  0.97        Systemic VTI:  0.28 m                            Systemic Diam: 2.00 cm Dani Gobble Croitoru MD Electronically signed by Sanda Klein MD Signature Date/Time: 07/12/2021/1:22:10 PM    Final     Cardiac Studies   Echo:       1. Normal left ventricular relaxation parameters. "Impaired relaxation"  pattern is due to low left atrial pressure (hypovolemia). Left ventricular  ejection fraction, by estimation, is 70 to 75%. The left ventricle has  hyperdynamic function. The left  ventricle has no regional wall motion abnormalities. Left ventricular  diastolic parameters were normal.   2. Right ventricular systolic function is normal. The right ventricular  size is normal. There is normal pulmonary artery systolic pressure.   3. The mitral valve is normal in structure. No evidence of mitral valve  regurgitation. No evidence of mitral stenosis.   4. The aortic valve is normal in structure. Aortic  valve regurgitation is  not visualized. No aortic stenosis is present.   5. The inferior vena cava is small/collapsed, consistent with low right  atrial pressure.    Patient Profile     70 y.o. female without prior cardiac history who had a hypotensive episode post surgery.  Mildly elevated troponin.  Assessment & Plan    Elevated troponin:   Non diagnostic.  Trended back down.   Echo without wall motion abnormalities.   I am going to arrange follow up in one month and will consider some screening stress testing when her knee allows it given the elevated troponin.      HTN:  Her BP has jumped up with her ambulation and discomfort.  She will need to resume the lisinopril/hct sooner rather than later.  I will resume today and she will keep a BP diary at home.    For questions or updates, please contact [place] Please consult [email or web address] for contact info under Cardiology/STEMI.   Signed, Minus Breeding, MD  07/13/2021, 11:54 AM

## 2021-07-13 NOTE — Progress Notes (Addendum)
Subjective: 2 Days Post-Op Procedure(s) (LRB): LEFT TOTAL KNEE ARTHROPLASTY (Left) Patient reports pain as mild.  Continuing to do well.  Denies chest pain/pressure/palpitations/sob  Objective: Vital signs in last 24 hours: Temp:  [98 F (36.7 C)-99.2 F (37.3 C)] 98.4 F (36.9 C) (11/09 0755) Pulse Rate:  [83-88] 83 (11/09 0755) Resp:  [13-17] 17 (11/09 0755) BP: (105-152)/(77-91) 105/91 (11/09 0755) SpO2:  [98 %-99 %] 98 % (11/09 0524)  Intake/Output from previous day: 11/08 0701 - 11/09 0700 In: 480 [P.O.:480] Out: -  Intake/Output this shift: No intake/output data recorded.  Recent Labs    07/12/21 0526  HGB 10.7*   Recent Labs    07/12/21 0526  WBC 9.7  RBC 4.01  HCT 34.3*  PLT 295   Recent Labs    07/11/21 1305 07/12/21 0526  NA 136 138  K 3.6 3.6  CL 104 106  CO2 20* 24  BUN 18 15  CREATININE 1.04* 0.98  GLUCOSE 235* 163*  CALCIUM 8.5* 8.3*   No results for input(s): LABPT, INR in the last 72 hours.  Neurologically intact Neurovascular intact Sensation intact distally Intact pulses distally Dorsiflexion/Plantar flexion intact Incision: dressing C/D/I No cellulitis present Compartment soft   Assessment/Plan: 2 Days Post-Op Procedure(s) (LRB): LEFT TOTAL KNEE ARTHROPLASTY (Left) Advance diet Up with therapy Discharge home with home health after second PT session, but must also be cleared by medicine team Tryptase not high like typically seen in a true anaphylactic reaction.  Intra-op hypotensive event likely from combination of having taken lisinopril morning of surgery/tourniquet release and dilaudid administration just prior to tourniquet release. Ok to restart HCTZ and Lisinopril as BP allows (am blood pressures slightly  hypotensive so will need to hold for now).  May restart hydralazine prn I have instructed patient to check bp prior to taking bp meds once d/c home.  May need to hold depending on readings. WBAT LLE ABLA- mild and  stable Echo showed hypovolemia-continue with IV fluids.  Encouraged po fluids  Anticipated LOS equal to or greater than 2 midnights due to - Age 70 and older with one or more of the following:  - Obesity  - Expected need for hospital services (PT, OT, Nursing) required for safe  discharge  - Anticipated need for postoperative skilled nursing care or inpatient rehab  - Active co-morbidities: Diabetes OR   - Unanticipated findings during/Post Surgery: Slow post-op progression: GI, pain control, mobility and intra-op hypotensive event   - Patient is a high risk of re-admission due to: Barriers to post-acute care (logistical, no family support in home)   Aundra Dubin 07/13/2021, 7:58 AM

## 2021-07-13 NOTE — Progress Notes (Signed)
Mobility Specialist Progress Note   07/13/21 1130  Mobility  Activity Ambulated in hall  Level of Assistance Contact guard assist, steadying assist  Assistive Device Front wheel walker  LLE Weight Bearing WBAT  Distance Ambulated (ft) 135 ft  Mobility Ambulated with assistance in hallway  Mobility Response Tolerated well  Mobility performed by Mobility specialist  $Mobility charge 1 Mobility   Received pt in chair c/o being in 6/10 pain but agreeable to mobility. X1 seated break for energy conservation, returned back to bed w/ call bell by side and all needs met.    Holland Falling Mobility Specialist Phone Number [phone]

## 2021-07-13 NOTE — Discharge Summary (Addendum)
Patient ID: Melissa [name] MRN: [id number] DOB/AGE: [birth year] 70 y.o.  Admit date: 07/11/2021 Discharge date: 07/13/2021  Admission Diagnoses:  Principal Problem:   Primary osteoarthritis of left knee Active Problems:   Status post total left knee replacement   DJD (degenerative joint disease) of knee   Discharge Diagnoses:  Same  Past Medical History:  Diagnosis Date   Allergy    Anxiety    Arthritis    s/p R TKR   Cyst of left kidney 2015   by lumbar MRI pending renal US   DDD (degenerative disc disease), lumbar 03/2014   severe L2-3 with L HNP with L2/3 nerve root impingement Melissa [name] @ [facility])   Depression with anxiety    Diabetes type 2, controlled ([place]) 2011   borderline   Glaucoma    History of ulcer disease    HLD (hyperlipidemia)    no meds taken   Hypertension    Plantar fasciitis of left foot 10/01/2013   PONV (postoperative nausea and vomiting)    Right-sided face pain 01/07/2019   Sleep apnea 2022    Surgeries: Procedure(s): LEFT TOTAL KNEE ARTHROPLASTY on 07/11/2021   Consultants: Treatment Team:  Lequita Halt, MD [facility], [place], MD  Discharged Condition: Improved  Hospital Course: Melissa [name] is an 70 y.o. female who was admitted 07/11/2021 for operative treatment ofPrimary osteoarthritis of left knee. Patient has severe unremitting pain that affects sleep, daily activities, and work/hobbies. After pre-op clearance the patient was taken to the operating room on 07/11/2021 and underwent  Procedure(s): LEFT TOTAL KNEE ARTHROPLASTY. Patient developed Intra-op hypotensive event likely from combination of having taken lisinopril morning of surgery/tourniquet release and dilaudid administration just prior to tourniquet release.  She has recovered well from this.  Patient was given perioperative antibiotics:  Anti-infectives (From admission, onward)    Start     Dose/Rate Route Frequency Ordered Stop   07/11/21 2000  ciprofloxacin (CIPRO) tablet  500 mg        500 mg Oral 2 times daily 07/11/21 1547 07/18/21 1959   07/11/21 1700  ceFAZolin (ANCEF) IVPB 2g/100 mL premix        2 g 200 mL/hr over 30 Minutes Intravenous Every 6 hours 07/11/21 1547 07/11/21 2347   07/11/21 1000  vancomycin (VANCOCIN) powder  Status:  Discontinued          As needed 07/11/21 1000 07/11/21 1158   07/11/21 0745  ceFAZolin (ANCEF) IVPB 2g/100 mL premix        2 g 200 mL/hr over 30 Minutes Intravenous On call to O.R. 07/11/21 5974 07/11/21 0945        Patient was given sequential compression devices, early ambulation, and chemoprophylaxis to prevent DVT.  Patient benefited maximally from hospital stay and there were no complications.    Recent vital signs: Patient Vitals for the past 24 hrs:  BP Temp Temp src Pulse Resp SpO2  07/13/21 1159 (!) 172/95 98.3 F (36.8 C) Oral 88 20 98 %  07/13/21 0755 (!) 105/91 98.4 F (36.9 C) Oral 83 17 --  07/13/21 0524 (!) 152/88 99.2 F (37.3 C) -- 83 17 98 %  07/12/21 1941 123/77 98.6 F (37 C) -- 88 13 98 %     Recent laboratory studies:  Recent Labs    07/11/21 1305 07/12/21 0526  WBC  --  9.7  HGB  --  10.7*  HCT  --  34.3*  PLT  --  295  NA 136 138  K 3.6 3.6  CL 104 106  CO2 20* 24  BUN 18 15  CREATININE 1.04* 0.98  GLUCOSE 235* 163*  CALCIUM 8.5* 8.3*     Discharge Medications:   Allergies as of 07/13/2021       Reactions   Codeine Other (See Comments)   palpitations   Metformin And Related Nausea Only   Other    Derma bond - contact dermatitis   Oxycodone Nausea And Vomiting        Medication List     STOP taking these medications    acetaminophen 500 MG tablet Commonly known as: TYLENOL       TAKE these medications    Accu-Chek FastClix Lancets Misc USE TO TEST BLOOD SUGAR UP TO FOUR TIMES DAILY   Accu-Chek Guide test strip Generic drug: glucose blood USE AS DIRECTED FOUR TIMES DAILY   ALPRAZolam 0.25 MG tablet Commonly known as: XANAX TAKE 1 TABLET(0.25  MG) BY MOUTH AT BEDTIME AS NEEDED FOR SLEEP   aspirin EC 81 MG tablet Take 1 tablet (81 mg total) by mouth 2 (two) times daily. To be taken after surgery   blood glucose meter kit and supplies Kit Dispense based on patient and insurance preference. Use up to four times daily as directed. DX Code: E11.9   ciprofloxacin 500 MG tablet Commonly known as: Cipro Take 1 tablet (500 mg total) by mouth 2 (two) times daily for 6 days.   cycloSPORINE 0.05 % ophthalmic emulsion Commonly known as: RESTASIS Place 1 drop into both eyes 2 (two) times daily as needed (dry eyes).   desonide 0.05 % ointment Commonly known as: DESOWEN Apply 1 application topically 2 (two) times daily as needed.   docusate sodium 100 MG capsule Commonly known as: Colace Take 1 capsule (100 mg total) by mouth daily as needed.   Eucrisa 2 % Oint Generic drug: Crisaborole Apply 1 application topically daily as needed (Itchy skin).   glipiZIDE 2.5 MG 24 hr tablet Commonly known as: GLUCOTROL XL Take 2.5 mg by mouth daily with breakfast.   hydrochlorothiazide 25 MG tablet Commonly known as: HYDRODIURIL Take 1 tablet (25 mg total) by mouth daily. What changed:  when to take this reasons to take this   HYDROcodone-acetaminophen 7.5-325 MG tablet Commonly known as: Norco Take 1-2 tablets by mouth every 6 (six) hours as needed for moderate pain. To be taken after surgery   hydroquinone 4 % cream Apply to darker areas of skin once a day as needed   levocetirizine 5 MG tablet Commonly known as: XYZAL Take 1 tablet (5 mg total) by mouth every evening.   lisinopril 20 MG tablet Commonly known as: ZESTRIL Take 1 tablet (20 mg total) by mouth daily.   methocarbamol 500 MG tablet Commonly known as: Robaxin Take 1 tablet (500 mg total) by mouth 2 (two) times daily as needed. To be taken after surgery   MULTIPLE VITAMIN PO Take 1 tablet by mouth daily.   omeprazole 20 MG capsule Commonly known as:  PRILOSEC Take 20 mg by mouth daily as needed (heartburn).   ondansetron 4 MG tablet Commonly known as: Zofran Take 1 tablet (4 mg total) by mouth every 8 (eight) hours as needed for nausea or vomiting.   sulfamethoxazole-trimethoprim 800-160 MG tablet Commonly known as: BACTRIM DS Take 1 tablet by mouth 2 (two) times daily. To be taken after surgery   Trulicity 1.5 LZ/7.6BH Sopn Generic drug: Dulaglutide Inject 1.5 mg into the skin every Friday.  Vyzulta 0.024 % Soln Generic drug: Latanoprostene Bunod Place 1 drop into both eyes at bedtime.               Durable Medical Equipment  (From admission, onward)           Start     Ordered   07/11/21 1547  DME Walker rolling  Once       Question Answer Comment  Walker: With 5 Inch Wheels   Patient needs a walker to treat with the following condition Status post left partial knee replacement      07/11/21 1547   07/11/21 1547  DME 3 n 1  Once        07/11/21 1547   07/11/21 1547  DME Bedside commode  Once       Question:  Patient needs a bedside commode to treat with the following condition  Answer:  Status post left partial knee replacement   07/11/21 1547            Diagnostic Studies: DG Chest Port 1 View  Result Date: 07/11/2021 CLINICAL DATA:  Hypotension EXAM: PORTABLE CHEST 1 VIEW COMPARISON:  05/28/2020 FINDINGS: Patient is slightly rotated. The heart size and mediastinal contours are within normal limits. No focal airspace consolidation, pleural effusion, or pneumothorax. Partially visualized reverse right shoulder arthroplasty hardware. The visualized skeletal structures are unremarkable. IMPRESSION: No active disease. Electronically Signed   By: Davina Poke D.O.   On: 07/11/2021 19:56   DG Knee Left Port  Result Date: 07/11/2021 CLINICAL DATA:  70 year old female status post left knee arthroplasty. EXAM: PORTABLE LEFT KNEE - 1-2 VIEW COMPARISON:  No priors. FINDINGS: Two views of the left knee  demonstrate postoperative changes of total knee arthroplasty. The femoral and tibial components of the prosthesis appear well seated without definite periprosthetic fracture or other immediate complicating features. Gas is present in the joint space. IMPRESSION: 1. Expected postoperative findings of recent left total knee arthroplasty without immediate complicating features. Electronically Signed   By: Vinnie Langton M.D.   On: 07/11/2021 12:52   ECHOCARDIOGRAM COMPLETE  Result Date: 07/12/2021    ECHOCARDIOGRAM REPORT   Patient Name:   CAILEIGH CANCHE Date of Exam: 07/12/2021 Medical Rec #:  [id number]         Height:       65.0 in Accession #:    [id number]        Weight:       199.0 lb Date of Birth:  [birth year]         BSA:          1.974 m Patient Age:    89 years          BP:           128/87 mmHg Patient Gender: F                 HR:           93 bpm. Exam Location:  Inpatient Procedure: 2D Echo, Color Doppler and Cardiac Doppler Indications:    Elevated troponin  History:        Patient has prior history of Echocardiogram examinations. Risk                 Factors:Hypertension, Diabetes and Sleep Apnea.  Sonographer:    Jyl Heinz Referring Phys: [id number] [place]  1. Normal left ventricular relaxation parameters. "Impaired relaxation" pattern is due to low left atrial pressure (hypovolemia). Left  ventricular ejection fraction, by estimation, is 70 to 75%. The left ventricle has hyperdynamic function. The left ventricle has no regional wall motion abnormalities. Left ventricular diastolic parameters were normal.  2. Right ventricular systolic function is normal. The right ventricular size is normal. There is normal pulmonary artery systolic pressure.  3. The mitral valve is normal in structure. No evidence of mitral valve regurgitation. No evidence of mitral stenosis.  4. The aortic valve is normal in structure. Aortic valve regurgitation is not visualized. No aortic stenosis is  present.  5. The inferior vena cava is small/collapsed, consistent with low right atrial pressure. FINDINGS  Left Ventricle: Normal left ventricular relaxation parameters. "Impaired relaxation" pattern is due to low left atrial pressure (hypovolemia). Left ventricular ejection fraction, by estimation, is 70 to 75%. The left ventricle has hyperdynamic function.  The left ventricle has no regional wall motion abnormalities. The left ventricular internal cavity size was normal in size. There is no left ventricular hypertrophy. Left ventricular diastolic parameters were normal. Right Ventricle: The right ventricular size is normal. No increase in right ventricular wall thickness. Right ventricular systolic function is normal. There is normal pulmonary artery systolic pressure. The tricuspid regurgitant velocity is 2.63 m/s, and  with an assumed right atrial pressure of 0 mmHg, the estimated right ventricular systolic pressure is 16.1 mmHg. Left Atrium: Left atrial size was normal in size. Right Atrium: Right atrial size was normal in size. Pericardium: There is no evidence of pericardial effusion. Mitral Valve: The mitral valve is normal in structure. No evidence of mitral valve regurgitation. No evidence of mitral valve stenosis. Tricuspid Valve: The tricuspid valve is normal in structure. Tricuspid valve regurgitation is mild . No evidence of tricuspid stenosis. Aortic Valve: The aortic valve is normal in structure. Aortic valve regurgitation is not visualized. No aortic stenosis is present. Aortic valve peak gradient measures 12.1 mmHg. Pulmonic Valve: The pulmonic valve was normal in structure. Pulmonic valve regurgitation is not visualized. No evidence of pulmonic stenosis. Aorta: The aortic root is normal in size and structure. Venous: The inferior vena cava is small/collapsed, consistent with low right atrial pressure. IAS/Shunts: No atrial level shunt detected by color flow Doppler.  LEFT VENTRICLE PLAX 2D  LVIDd:         4.00 cm     Diastology LVIDs:         2.90 cm     LV e' medial:    9.25 cm/s LV PW:         0.90 cm     LV E/e' medial:  10.2 LV IVS:        0.70 cm     LV e' lateral:   11.30 cm/s LVOT diam:     2.00 cm     LV E/e' lateral: 8.3 LV SV:         89 LV SV Index:   45 LVOT Area:     3.14 cm  LV Volumes (MOD) LV vol d, MOD A2C: 77.1 ml LV vol d, MOD A4C: 81.3 ml LV vol s, MOD A2C: 31.7 ml LV vol s, MOD A4C: 33.1 ml LV SV MOD A2C:     45.4 ml LV SV MOD A4C:     81.3 ml LV SV MOD BP:      46.7 ml RIGHT VENTRICLE             IVC RV Basal diam:  3.20 cm     IVC diam: 1.40 cm RV Mid diam:  2.60 cm RV S prime:     19.50 cm/s TAPSE (M-mode): 3.2 cm LEFT ATRIUM             Index        RIGHT ATRIUM           Index LA diam:        3.40 cm 1.72 cm/m   RA Area:     12.90 cm LA Vol (A2C):   43.1 ml 21.83 ml/m  RA Volume:   29.30 ml  14.84 ml/m LA Vol (A4C):   44.7 ml 22.65 ml/m LA Biplane Vol: 48.0 ml 24.32 ml/m  AORTIC VALVE AV Area (Vmax): 2.76 cm AV Vmax:        174.00 cm/s AV Peak Grad:   12.1 mmHg LVOT Vmax:      153.00 cm/s LVOT Vmean:     107.000 cm/s LVOT VTI:       0.282 m  AORTA Ao Root diam: 2.90 cm Ao Asc diam:  3.30 cm MITRAL VALVE               TRICUSPID VALVE MV Area (PHT): 5.23 cm    TR Peak grad:   27.7 mmHg MV Decel Time: 145 msec    TR Vmax:        263.00 cm/s MV E velocity: 94.00 cm/s MV A velocity: 96.70 cm/s  SHUNTS MV E/A ratio:  0.97        Systemic VTI:  0.28 m                            Systemic Diam: 2.00 cm Dani Gobble Croitoru MD Electronically signed by Sanda Klein MD Signature Date/Time: 07/12/2021/1:22:10 PM    Final     Disposition: Discharge disposition: 01-Home or Self Care         Follow-up Information     Leandrew Koyanagi, MD. Go on 07/26/2021.   Specialty: Orthopedic Surgery Why: at 1:15 pm for your 2 week follow up appointment with MD. Contact information: [place] [address] [place], [place] Follow up.    Specialty: Home Health Services Why: Home health services will be provided by [facility] information: [place] [address] [phone]                  Signed: Aundra Dubin 07/13/2021, 3:18 PM

## 2021-07-13 NOTE — Progress Notes (Signed)
Mobility Specialist Progress Note   07/13/21 1450  Mobility  Activity Ambulated in room  Level of Assistance Standby assist, set-up cues, supervision of patient - no hands on  Assistive Device Front wheel walker  LLE Weight Bearing WBAT  Distance Ambulated (ft) 6 ft  Mobility Ambulated independently in room  Mobility Response Tolerated well  Mobility performed by Mobility specialist  Bed Position Chair  $Mobility charge 1 Mobility   Received pt in chair wanting assistance w/ ADL (getting dressed) . MinA w/ LLE attire, standbyA for UE. Returned pt back to chair w/ call bell in reach and eager for d/c.    Holland Falling Mobility Specialist Phone Number [phone]

## 2021-07-13 NOTE — Progress Notes (Signed)
Physical Therapy Treatment Patient Details Name: Melissa [name] MRN: [id number] DOB: Feb 20, 1951 Today's Date: 07/13/2021   History of Present Illness Pt is 70 yo female s/p L TKA on 07/11/21.  Post-op she did have a rash and hypotension -suspected allergic reaction , resolved with epinephrine injection.  Pt with hx of arthritis, DDD, DM2, HLD, HTN, hammer toe sx bil; bil THA; R TKA and R TKA revision, R TSA summer 2022.    PT Comments    Continuing work on functional mobility and activity tolerance;  Session focused on gait and transfers, including managing in the bathroom, and progressive amb in hallway; BPs stable in standing; Participated very well, despite feeling nauseated; on track for dc later today   Recommendations for follow up therapy are one component of a multi-disciplinary discharge planning process, led by the attending physician.  Recommendations may be updated based on patient status, additional functional criteria and insurance authorization.  Follow Up Recommendations  Follow physician's recommendations for discharge plan and follow up therapies     Assistance Recommended at Discharge Frequent or constant Supervision/Assistance  Equipment Recommendations  None recommended by PT    Recommendations for Other Services       Precautions / Restrictions Precautions Precautions: Fall Precaution Comments: fall risk is preset, but minimal Restrictions LLE Weight Bearing: Weight bearing as tolerated     Mobility  Bed Mobility Overal bed mobility: Needs Assistance Bed Mobility: Supine to Sit     Supine to sit: Supervision     General bed mobility comments: Used gait belt around L foot to help her LE off of teh bed    Transfers Overall transfer level: Needs assistance Equipment used: Rolling walker (2 wheels);1 person hand held assist Transfers: Sit to/from Stand Sit to Stand: Supervision Stand pivot transfers: Min guard         General transfer comment:  Showing good carryover of cues for hand placement and safety    Ambulation/Gait Ambulation/Gait assistance: Min guard Gait Distance (Feet): 150 Feet Assistive device: Rolling walker (2 wheels) Gait Pattern/deviations: Step-through pattern (emerging)       General Gait Details: cues to stand tall on LLE in stance, and activate L quad for stability; tell sme she has a small threshold to go up into her home, and that it is similar to the trheshold to get into the bathroom (which she managed well)   Stairs             Wheelchair Mobility    Modified Rankin (Stroke Patients Only)       Balance     Sitting balance-Leahy Scale: Normal       Standing balance-Leahy Scale: Fair                              Cognition Arousal/Alertness: Awake/alert Behavior During Therapy: WFL for tasks assessed/performed Overall Cognitive Status: Within Functional Limits for tasks assessed                                          Exercises      General Comments        Pertinent Vitals/Pain Pain Assessment: Faces Faces Pain Scale: Hurts a little bit Pain Location: L knee Pain Descriptors / Indicators: Grimacing;Guarding;Tightness Pain Intervention(s): Monitored during session    Home Living  Prior Function            PT Goals (current goals can now be found in the care plan section) Acute Rehab PT Goals Patient Stated Goal: return home PT Goal Formulation: With patient Time For Goal Achievement: 07/25/21 Potential to Achieve Goals: Good Progress towards PT goals: Progressing toward goals    Frequency    7X/week      PT Plan Current plan remains appropriate    Co-evaluation              AM-PAC PT "6 Clicks" Mobility   Outcome Measure  Help needed turning from your back to your side while in a flat bed without using bedrails?: A Little Help needed moving from lying on your back to sitting  on the side of a flat bed without using bedrails?: A Little Help needed moving to and from a bed to a chair (including a wheelchair)?: A Little Help needed standing up from a chair using your arms (e.g., wheelchair or bedside chair)?: None Help needed to walk in hospital room?: None Help needed climbing 3-5 steps with a railing? : A Little 6 Click Score: 20    End of Session Equipment Utilized During Treatment: Gait belt Activity Tolerance: Patient tolerated treatment well Patient left: in chair;with call bell/phone within reach Nurse Communication: Mobility status PT Visit Diagnosis: Other abnormalities of gait and mobility (R26.89);Muscle weakness (generalized) (M62.81);Pain Pain - Right/Left: Left Pain - part of body: Knee     Time: 8938-1017 PT Time Calculation (min) (ACUTE ONLY): 38 min  Charges:  $Gait Training: 23-37 mins $Therapeutic Activity: 8-22 mins                     Roney Marion, PT  Acute Rehabilitation Services Pager [phone] Office [place] 07/13/2021, 1:24 PM

## 2021-07-13 NOTE — Progress Notes (Signed)
PROGRESS NOTE  Melissa [name]  DOB: 1951/02/04  PCP: Angelica Pou, MD YDX:[id number]  DOA: 07/11/2021  LOS: 0 days  Hospital Day: 3  Chief complaint: Postop hypotension and rash  Brief narrative: Melissa ELLOWYN [name] is a 70 y.o. female with PMH significant for DM2, HTN, HLD obesity, sleep apnea, anxiety/depression who underwent elective left knee arthroplasty on 11/7. Medicine consultation was called after patient developed intraoperative hypotension. For orthopedic note, intraoperatively, after tourniquet was deflated, patient became gradually hypotensive.  Hypotension did not respond to phenylephrine or ephedrine and hence epinephrine and vasopressin were required.  Also during that time, oxygen saturation decreased to mid 80s and EtCO2 decreased to high 20s.  LMA was removed and patient was intubated.  Oxygen saturation and EtCO2 increased appropriately. On removal of the drapes, patient was noted to have diffuse rash over her arms and chest.  It was not clear how long the rash has been there.  Patient was given steroids and Benadryl. The only new medicine that was administered prior was Dilaudid about 15 minutes before the tourniquet released.  Patient was monitored in the OR.  Vital signs returned to baseline.  She was extubated without complication. Labs postoperatively showed elevated troponin. [facility] hospitalist and cardiology consultation was called.  Subjective: Patient was seen and examined this morning.  Pleasant elderly African-American female.  Working with physical therapy.  Sitting up at the edge of the bed.  Not in distress.  Orthopedics RN was at bedside too. Blood pressure in low normal range this morning.  Assessment/Plan: Anaphylactic reaction Intraoperative hypotension -It is unclear at this time what led her to have transient hypotension, diffuse rash intraoperatively.  Only new medicine she had gotten was Dilaudid 15 minutes prior to hypotension.  However the  blood pressure drop was also immediately after tourniquet release.   -Postop echocardiogram showed intravascular volume depletion.  So it seems her intraoperative hypotension could have been secondary to multiple factors that include dehydration due to n.p.o. status, antihypertensive in the morning of surgery, volume redistribution after tourniquet release and the use of Dilaudid.  However, this would not explain why patient had diffuse rashes. -As of now, patient does not have any rash.  Hypotension improving as well.  Elevated troponin -Troponin was up and peaked at 205, trended down again. -Cardiology consult obtained.  No EKG changes.  No history of CAD. -Echocardiogram did not show any wall motion abnormality. Recent Labs    07/11/21 1305 07/11/21 1655 07/12/21 0526  TROPONINIHS 35* 205* 44*   Essential hypertension -On HCTZ and lisinopril at home. -On hold for hypotension.  At discharge, I would hold both blood pressure medicines.  I have instructed the patient to monitor blood pressure at home.  If her systolic blood pressure is more than 140, she can resume hydrochlorothiazide.  If it is still remains more than 140, she can resume lisinopril.   Type 2 diabetes mellitus -A1c 5.5 on 11/7 -Home meds include glipizide 2.5 mg daily, Trulicity 1.5 mg weekly on Friday -Continue the same. Recent Labs  Lab 07/12/21 1223 07/12/21 1640 07/12/21 1709 07/12/21 1944 07/13/21 0829  GLUCAP 113* 175* 127* 111* 75   Left knee arthroplasty -pain control and DVT prophylaxis per primary team -Norco, Robaxin  Anxiety/depression -Home meds include as needed Xanax  Mobility: Encourage ambulation.  PT eval appreciated Living condition: Lives at home by self Goals of care:   Code Status: Full Code  Nutritional status: Body mass index is 33.12 kg/m.  Diet:  Diet Order             Diet Carb Modified Fluid consistency: Thin; Room service appropriate? Yes  Diet effective now                   DVT prophylaxis:  SCDs Start: 07/11/21 1547   Antimicrobials: None right now Fluid: IV fluid given in the hospital Family Communication: None at bedside  Status is: Observation  Remains inpatient appropriate because: Pending echocardiogram   Infusions:   sodium chloride 75 mL/hr at 07/12/21 1601   diphenhydrAMINE     famotidine (PEPCID) IV 20 mg (07/12/21 1603)   methocarbamol (ROBAXIN) IV      Scheduled Meds:  aspirin  81 mg Oral BID   ciprofloxacin  500 mg Oral BID   docusate sodium  100 mg Oral BID   [START ON 07/15/2021] Dulaglutide  1.5 mg Subcutaneous Q Fri   glipiZIDE  2.5 mg Oral Q breakfast   insulin aspart  0-15 Units Subcutaneous TID WC   methylPREDNISolone (SOLU-MEDROL) injection  40 mg Intravenous Daily    PRN meds: acetaminophen, diphenhydrAMINE, hydrALAZINE, HYDROcodone-acetaminophen, HYDROmorphone (DILAUDID) injection, menthol-cetylpyridinium **OR** phenol, methocarbamol **OR** methocarbamol (ROBAXIN) IV, metoCLOPramide **OR** metoCLOPramide (REGLAN) injection, ondansetron **OR** ondansetron (ZOFRAN) IV   Antimicrobials: Anti-infectives (From admission, onward)    Start     Dose/Rate Route Frequency Ordered Stop   07/11/21 2000  ciprofloxacin (CIPRO) tablet 500 mg        500 mg Oral 2 times daily 07/11/21 1547 07/18/21 1959   07/11/21 1700  ceFAZolin (ANCEF) IVPB 2g/100 mL premix        2 g 200 mL/hr over 30 Minutes Intravenous Every 6 hours 07/11/21 1547 07/11/21 2347   07/11/21 1000  vancomycin (VANCOCIN) powder  Status:  Discontinued          As needed 07/11/21 1000 07/11/21 1158   07/11/21 0745  ceFAZolin (ANCEF) IVPB 2g/100 mL premix        2 g 200 mL/hr over 30 Minutes Intravenous On call to O.R. 07/11/21 0733 07/11/21 0945       Objective: Vitals:   07/13/21 0524 07/13/21 0755  BP: (!) 152/88 (!) 105/91  Pulse: 83 83  Resp: 17 17  Temp: 99.2 F (37.3 C) 98.4 F (36.9 C)  SpO2: 98%     Intake/Output Summary (Last 24  hours) at 07/13/2021 1000 Last data filed at 07/12/2021 1500 Gross per 24 hour  Intake 240 ml  Output --  Net 240 ml   Filed Weights   07/11/21 0735  Weight: 90.3 kg   Weight change:  Body mass index is 33.12 kg/m.   Physical Exam: General exam: Pleasant, elderly African-American female.  Not in distress Skin: No rashes, lesions or ulcers. HEENT: Atraumatic, normocephalic, no obvious bleeding Lungs: Clear to auscultation bilaterally CVS: Regular rate and rhythm, no murmur GI/Abd soft, nontender, nondistended, bowel sound present CNS: Alert, awake, oriented x3 Psychiatry: Mood appropriate Extremities: No pedal edema, no calf tenderness.  Right shoulder with scar of rash that she had in June after shoulder surgery.  Data Review: I have personally reviewed the laboratory data and studies available.  F/u labs ordered Unresulted Labs (From admission, onward)     Start     Ordered   07/12/21 0500  CBC  Tomorrow morning,   R        07/11/21 1547            Signed, Terrilee Croak, MD [facility] 07/13/2021

## 2021-07-13 NOTE — Progress Notes (Signed)
Physical Therapy Treatment Patient Details Name: Melissa [name] MRN: [id number] DOB: [birth year] Today's Date: 07/13/2021   History of Present Illness Pt is 70 yo female s/p L TKA on 07/11/21.  Post-op she did have a rash and hypotension -suspected allergic reaction , resolved with epinephrine injection.  Pt with hx of arthritis, DDD, DM2, HLD, HTN, hammer toe sx bil; bil THA; R TKA and R TKA revision, R TSA summer 2022.    PT Comments    Continuing work on functional mobility and activity tolerance;  Improving ability to get to bathroom; provided pt with HEP sheets and described rational and technqiues; Questions solicited and answered; OK for dc home from PT standpoint    Recommendations for follow up therapy are one component of a multi-disciplinary discharge planning process, led by the attending physician.  Recommendations may be updated based on patient status, additional functional criteria and insurance authorization.  Follow Up Recommendations  Follow physician's recommendations for discharge plan and follow up therapies     Assistance Recommended at Discharge Frequent or constant Supervision/Assistance  Equipment Recommendations  None recommended by PT    Recommendations for Other Services       Precautions / Restrictions Precautions Precautions: Fall Precaution Comments: fall risk is preset, but minimal Restrictions LLE Weight Bearing: Weight bearing as tolerated     Mobility  Bed Mobility Overal bed mobility: Needs Assistance Bed Mobility: Supine to Sit     Supine to sit: Supervision     General bed mobility comments: Did not use a belt to help LLE off of bed, and therefore took more time, but still did not need phsyical assist    Transfers Overall transfer level: Needs assistance Equipment used: Rolling walker (2 wheels);1 person hand held assist Transfers: Sit to/from Stand Sit to Stand: Supervision Stand pivot transfers: Min guard         General  transfer comment: Showing good carryover of cues for hand placement and safety    Ambulation/Gait Ambulation/Gait assistance: Supervision Gait Distance (Feet): 15 Feet (to bathroom) Assistive device: Rolling walker (2 wheels) Gait Pattern/deviations: Step-through pattern (emerging)       General Gait Details: cues to stand tall on LLE in stance, and activate L quad for stability; tell sme she has a small threshold to go up into her home, and that it is similar to the trheshold to get into the bathroom (which she managed well)   Stairs             Wheelchair Mobility    Modified Rankin (Stroke Patients Only)       Balance     Sitting balance-Leahy Scale: Normal       Standing balance-Leahy Scale: Fair Standing balance comment: RW to mobilize but able to perform toileting ADLs without UE support and min guard                            Cognition Arousal/Alertness: Awake/alert Behavior During Therapy: WFL for tasks assessed/performed Overall Cognitive Status: Within Functional Limits for tasks assessed                                          Exercises Total Joint Exercises Quad Sets: AROM;Left;5 reps    General Comments        Pertinent Vitals/Pain Pain Assessment: Faces Faces Pain Scale: Hurts little  more Pain Location: L knee Pain Descriptors / Indicators: Grimacing;Guarding;Tightness Pain Intervention(s): Monitored during session    Home Living                          Prior Function            PT Goals (current goals can now be found in the care plan section) Acute Rehab PT Goals Patient Stated Goal: return home PT Goal Formulation: With patient Time For Goal Achievement: 07/25/21 Potential to Achieve Goals: Good Progress towards PT goals: Progressing toward goals    Frequency    7X/week      PT Plan Current plan remains appropriate    Co-evaluation              AM-PAC PT "6 Clicks"  Mobility   Outcome Measure  Help needed turning from your back to your side while in a flat bed without using bedrails?: None Help needed moving from lying on your back to sitting on the side of a flat bed without using bedrails?: None Help needed moving to and from a bed to a chair (including a wheelchair)?: None Help needed standing up from a chair using your arms (e.g., wheelchair or bedside chair)?: None Help needed to walk in hospital room?: None Help needed climbing 3-5 steps with a railing? : A Little 6 Click Score: 23    End of Session Equipment Utilized During Treatment: Gait belt Activity Tolerance: Patient tolerated treatment well Patient left: in chair;with call bell/phone within reach Nurse Communication: Mobility status PT Visit Diagnosis: Other abnormalities of gait and mobility (R26.89);Muscle weakness (generalized) (M62.81);Pain Pain - Right/Left: Left Pain - part of body: Knee     Time: 1243-1300 PT Time Calculation (min) (ACUTE ONLY): 17 min  Charges:  $Gait Training: 8-22 mins $Therapeutic Activity: 8-22 mins                     Roney Marion, PT  Acute Rehabilitation Services Pager [phone] Office [place] 07/13/2021, 2:31 PM

## 2021-07-14 ENCOUNTER — Telehealth: Payer: Self-pay | Admitting: *Deleted

## 2021-07-14 NOTE — Telephone Encounter (Signed)
Ortho bundle D/C call completed. 

## 2021-07-15 ENCOUNTER — Other Ambulatory Visit: Payer: Self-pay | Admitting: *Deleted

## 2021-07-15 DIAGNOSIS — Z96652 Presence of left artificial knee joint: Secondary | ICD-10-CM

## 2021-07-15 DIAGNOSIS — M1712 Unilateral primary osteoarthritis, left knee: Secondary | ICD-10-CM

## 2021-07-19 ENCOUNTER — Telehealth: Payer: Self-pay | Admitting: *Deleted

## 2021-07-19 NOTE — Telephone Encounter (Signed)
Ortho bundle 7 day call attempted; no answer and left VM requesting call back. 

## 2021-07-19 NOTE — Telephone Encounter (Signed)
Ortho bundle 7 day call completed. 

## 2021-07-26 ENCOUNTER — Ambulatory Visit (INDEPENDENT_AMBULATORY_CARE_PROVIDER_SITE_OTHER): Payer: Medicare Other | Admitting: Physician Assistant

## 2021-07-26 ENCOUNTER — Ambulatory Visit: Payer: Medicare Other | Admitting: Physical Therapy

## 2021-07-26 ENCOUNTER — Encounter: Payer: Self-pay | Admitting: Physical Therapy

## 2021-07-26 ENCOUNTER — Ambulatory Visit (INDEPENDENT_AMBULATORY_CARE_PROVIDER_SITE_OTHER): Payer: Medicare Other | Admitting: Physical Therapy

## 2021-07-26 ENCOUNTER — Encounter: Payer: Self-pay | Admitting: Orthopaedic Surgery

## 2021-07-26 ENCOUNTER — Other Ambulatory Visit: Payer: Self-pay

## 2021-07-26 ENCOUNTER — Telehealth: Payer: Self-pay | Admitting: *Deleted

## 2021-07-26 DIAGNOSIS — M25662 Stiffness of left knee, not elsewhere classified: Secondary | ICD-10-CM

## 2021-07-26 DIAGNOSIS — Z96652 Presence of left artificial knee joint: Secondary | ICD-10-CM

## 2021-07-26 DIAGNOSIS — R600 Localized edema: Secondary | ICD-10-CM | POA: Diagnosis not present

## 2021-07-26 DIAGNOSIS — R2681 Unsteadiness on feet: Secondary | ICD-10-CM

## 2021-07-26 DIAGNOSIS — R2689 Other abnormalities of gait and mobility: Secondary | ICD-10-CM

## 2021-07-26 DIAGNOSIS — M25562 Pain in left knee: Secondary | ICD-10-CM

## 2021-07-26 DIAGNOSIS — M6281 Muscle weakness (generalized): Secondary | ICD-10-CM

## 2021-07-26 NOTE — Telephone Encounter (Signed)
Ortho bundle 14 day in office meeting completed. °

## 2021-07-26 NOTE — Progress Notes (Signed)
Post-Op Visit Note   Patient: Melissa [name]           Date of Birth: 08-12-1951           MRN: [id number] Visit Date: 07/26/2021 PCP: Angelica Pou, MD   Assessment & Plan:  Chief Complaint:  Chief Complaint  Patient presents with   Left Knee - Pain, Routine Post Op   Visit Diagnoses:  1. H/O total knee replacement, left     Plan: Patient is a pleasant 70-year-old female who comes in today 2 weeks status post left total knee replacement 07/11/2021.  She has been doing well.  She has had minimal pain for which she takes an occasional hydrocodone.  She is ambulating with a single-point cane as she is recently finished home health physical therapy.  She has her first outpatient physical therapy visit this afternoon.  She has been compliant taking a baby aspirin twice daily for DVT prophylaxis.  She denies any calf pain, chest pain or shortness of breath.  Examination of the left knee reveals a well-healed surgical incision with nylon sutures in place.  No evidence of infection or cellulitis.  Calf is soft nontender.  She is neurovascular tact distally.  Today, sutures were removed and Steri-Strips applied.  Dental prophylaxis reinforced.  She will continue the aspirin for another 4 weeks.  Follow-up with [place] in 4 weeks time for repeat evaluation and 2 view x-rays of the left knee.  Call with concerns or questions.  Follow-Up Instructions: Return in about 4 weeks (around 08/23/2021).   Orders:  No orders of the defined types were placed in this encounter.  No orders of the defined types were placed in this encounter.   Imaging: No new imaging  PMFS History: Patient Active Problem List   Diagnosis Date Noted   Primary osteoarthritis of left knee 07/11/2021   Status post total left knee replacement 07/11/2021   DJD (degenerative joint disease) of knee 07/11/2021   Great toe pain, left 06/08/2021   Sleep apnea 05/24/2021   Low grade fever 01/17/2020   Frequent urination  10/14/2019   OAB (overactive bladder) 10/14/2019   Trigger middle finger of right hand 11/12/2018   Spondylosis of cervical region without myelopathy or radiculopathy 01/14/2018   Chronic left shoulder pain 01/08/2018   History of bilateral hip replacements 07/19/2017   Rotator cuff tear 02/02/2017   Acquired renal cyst of left kidney 01/01/2016   Osteoarthritis of spine with radiculopathy, 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   DDD (degenerative disc disease), lumbar 03/04/2014   Lumbar facet joint pain 02/24/2014   IBS (irritable bowel syndrome) 05/13/2013   Obesity 11/13/2012   Glaucoma    Type 2 diabetes mellitus, controlled ([place])    Anxiety and depression    Vitreous degeneration, unspecified eye 10/25/2012   HTN (hypertension) 01/06/2012   GERD (gastroesophageal reflux disease) 01/06/2012   Hypercholesteremia 01/06/2012   Past Medical History:  Diagnosis Date   Allergy    Anxiety    Arthritis    s/p R TKR   Cyst of left kidney 2015   by lumbar MRI pending renal US   DDD (degenerative disc disease), lumbar 03/2014   severe L2-3 with L HNP with L2/3 nerve root impingement [name])   Depression with anxiety    Diabetes type 2, controlled ([place]) 2011   borderline   Glaucoma    History of ulcer disease    HLD (hyperlipidemia)    no meds taken   Hypertension  Plantar fasciitis of left foot 10/01/2013   PONV (postoperative nausea and vomiting)    Right-sided face pain 01/07/2019   Sleep apnea 2022    Family History  Problem Relation Age of Onset   Cancer Maternal Grandmother 69       ovarian   Diabetes Maternal Grandmother    Hypertension Maternal Grandmother    Cancer Mother 36       bone, MM   Stroke Other        unsure who   CAD Neg Hx    Colon cancer Neg Hx    Esophageal cancer Neg Hx    Rectal cancer Neg Hx    Stomach cancer Neg Hx     Past Surgical History:  Procedure Laterality Date   ABDOMINAL HYSTERECTOMY  2007   fibroids, heavy bleeding   ARTERY BIOPSY  Right 01/23/2019   Procedure: REMOVAL OF PART OF RIGHT TEMPORAL ARTERY FOR BIOPSY;  Surgeon: Michael Boston, MD;  Location: [facility];  Service: General;  Laterality: Right;   CATARACT EXTRACTION W/ INTRAOCULAR LENS IMPLANT Bilateral    COLONOSCOPY  04/2017   TA, diverticulosis, rpt ? ([place])   GLAUCOMA SURGERY     HAMMER TOE SURGERY Bilateral    Right hip replacement     TONSILLECTOMY     TOTAL HIP ARTHROPLASTY Left 2021   TOTAL KNEE ARTHROPLASTY Right 2004   TKR (Dr. Eddie Dibbles with [facility])   TOTAL KNEE ARTHROPLASTY Left 07/11/2021   Procedure: LEFT TOTAL KNEE ARTHROPLASTY;  Surgeon: Leandrew Koyanagi, MD;  Location: [place] [facility];  Service: Orthopedics;  Laterality: Left;   TOTAL KNEE REVISION Right 03/2014   Dr Al Corpus [facility]   TOTAL SHOULDER ARTHROPLASTY Right 2022   Social History   Occupational History   Occupation: work with disabilities/ Retired    Fish farm manager: [employer]  Tobacco Use   Smoking status: Never   Smokeless tobacco: Never  Vaping Use   Vaping Use: Never used  Substance and Sexual Activity   Alcohol use: No   Drug use: No   Sexual activity: Not Currently    Birth control/protection: Surgical

## 2021-07-26 NOTE — Patient Instructions (Signed)
Access Code: [email or web address] URL: [email or web address] Date: 07/26/2021 Prepared by: Jamey Reas  Exercises Quad Setting and Stretching - 2-4 x daily - 7 x weekly - 5-10 sets - 10 reps - prop 5-10 minutes & quad set5 seconds hold Supine Leg Press - 2-4 x daily - 7 x weekly - 5-10 sets - 10 reps - 5 seconds hold Ankle Alphabet in Elevation - 2-4 x daily - 7 x weekly - 1 sets - 1 reps Supine Heel Slide with Strap - 2-3 x daily - 7 x weekly - 2-3 sets - 10 reps - 5 seconds hold Supine Heel Slide with Small Ball - 2-3 x daily - 7 x weekly - 2-3 sets - 10 reps - 5 seconds hold Supine Straight Leg Raises - 2-3 x daily - 7 x weekly - 2-3 sets - 10 reps - 5 seconds hold Seated Knee Flexion Extension AROM - 2-4 x daily - 7 x weekly - 2-3 sets - 10 reps - 5 seconds hold Seated Hamstring Stretch with Strap - 2-4 x daily - 7 x weekly - 1 sets - 3 reps - 20-30 seconds hold

## 2021-07-26 NOTE — Therapy (Signed)
[facility] [address] [place], [place], [address] Phone: [phone]   Fax:  [phone]  Physical Therapy Evaluation  Patient Details  Name: Melissa [name] MRN: [id number] Date of Birth: [birth year] Referring Provider (PT): Frankey Shown, MD   Encounter Date: 07/26/2021   PT End of Session - 07/26/21 1423     Visit Number 1    Number of Visits 18    Date for PT Re-Evaluation 09/23/21    Authorization Type UHC Medicare    Progress Note Due on Visit 10    PT Start Time 1351    PT Stop Time 1430    PT Time Calculation (min) 39 min    Activity Tolerance Patient tolerated treatment well;Patient limited by pain    Behavior During Therapy [facility] for tasks assessed/performed             Past Medical History:  Diagnosis Date   Allergy    Anxiety    Arthritis    s/p R TKR   Cyst of left kidney 2015   by lumbar MRI pending renal US   DDD (degenerative disc disease), lumbar 03/2014   severe L2-3 with L HNP with L2/3 nerve root impingement [name])   Depression with anxiety    Diabetes type 2, controlled ([place]) 2011   borderline   Glaucoma    History of ulcer disease    HLD (hyperlipidemia)    no meds taken   Hypertension    Plantar fasciitis of left foot 10/01/2013   PONV (postoperative nausea and vomiting)    Right-sided face pain 01/07/2019   Sleep apnea 2022    Past Surgical History:  Procedure Laterality Date   ABDOMINAL HYSTERECTOMY  2007   fibroids, heavy bleeding   ARTERY BIOPSY Right 01/23/2019   Procedure: REMOVAL OF PART OF RIGHT TEMPORAL ARTERY FOR BIOPSY;  Surgeon: Michael Boston, MD;  Location: [place];  Service: General;  Laterality: Right;   CATARACT EXTRACTION W/ INTRAOCULAR LENS IMPLANT Bilateral    COLONOSCOPY  04/2017   TA, diverticulosis, rpt ? ([facility])   GLAUCOMA SURGERY     HAMMER TOE SURGERY Bilateral    Right hip replacement     TONSILLECTOMY     TOTAL HIP ARTHROPLASTY Left 2021   TOTAL KNEE  ARTHROPLASTY Right 2004   TKR (Dr. Eddie Dibbles with [facility])   TOTAL KNEE ARTHROPLASTY Left 07/11/2021   Procedure: LEFT TOTAL KNEE ARTHROPLASTY;  Surgeon: Leandrew Koyanagi, MD;  Location: [place];  Service: Orthopedics;  Laterality: Left;   TOTAL KNEE REVISION Right 03/2014   Dr Al Corpus [place] Right 2022    There were no vitals filed for this visit.    Subjective Assessment - 07/26/21 1352     Subjective This 70yo female was referred to PT by Leandrew Koyanagi, MD with [phone] (ICD-10-CM) - Primary osteoarthritis of left knee & [phone] (ICD-10-CM) - Status post total left knee replacement performed on 07/11/2021. HHPT thru 07/22/2021.    Pertinent History arthritis, DDD, lumbar radiofrequency L3-S1 2022, DM2, glaucoma, HTN, lt plantarfacilitis, lt THA, Rt TKA 04 revision 2015, rt TSA 02/16/2021    Patient Stated Goals get back to working her part-time job by 12/03/2021, walk in community    Currently in Pain? Yes    Pain Score 4    In last week, lowest 0/10 & highest 9/10   Pain Location Knee    Pain Orientation Left    Pain Descriptors /  Indicators Sore;Aching    Pain Type Surgical pain    Pain Onset 1 to 4 weeks ago    Pain Frequency Intermittent    Aggravating Factors  when PT pushes knee in bent position, standing for long times or a lot during day    Pain Relieving Factors rest, ice, med    Effect of Pain on Daily Activities time standing & walking                [facility] PT Assessment - 07/26/21 1346       Assessment   Medical Diagnosis M17.12 (ICD-10-CM) - Primary osteoarthritis of left knee & G40.102 (ICD-10-CM) - Status post total left knee replacement    Referring Provider (PT) Frankey Shown, MD    Onset Date/Surgical Date 07/11/21    Hand Dominance Right    Prior Therapy HHPT 5 visits      Precautions   Precautions None      Balance Screen   Has the patient fallen in the past 6 months No    Has the patient had a decrease in activity level because of a  fear of falling?  Yes    Is the patient reluctant to leave their home because of a fear of falling?  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, wants to work in [employer]    Leisure go to movies, garden Licensed conveyancer, active in church      Observation/Other Assessments-Edema    Edema Circumferential      Circumferential Edema   Circumferential - Right above knee 43cm, around knee 42.3cm, below knee 38cm    Circumferential - Left  above knee 47cm, around knee 45.3cm, below knee 43cm      ROM / Strength   AROM / PROM / Strength AROM;PROM;Strength      AROM   AROM Assessment Site Knee    Right/Left Knee Left    Left Knee Extension -26   seated LAQ   Left Knee Flexion 71   standing with BUE support     PROM   PROM Assessment Site Knee    Right/Left Knee Left    Left Knee Extension -8   supine   Left Knee Flexion 76   supine     Strength   Strength Assessment Site Knee    Right/Left Knee Left    Left Knee Flexion 3-/5    Left Knee Extension 3-/5      Transfers   Transfers Sit to Stand;Stand to Sit    Sit to Stand 5: Supervision;With upper extremity assist;With armrests;From chair/3-in-1    Stand to Sit 5: Supervision;With upper extremity assist;With armrests;To chair/3-in-1      Ambulation/Gait   Ambulation/Gait Yes    Ambulation/Gait Assistance 5: Supervision    Ambulation Distance (Feet) 100 Feet    Assistive device Small based quad cane    Gait Pattern Step-to pattern;Decreased arm swing - left;Decreased step length -  right;Decreased stance time - left;Decreased hip/knee flexion -  left;Decreased weight shift to left;Left hip hike;Left circumduction;Left flexed knee in stance;Antalgic;Trunk flexed;Abducted - left    Ambulation Surface Level;Indoor                        Objective measurements completed on examination: See above findings.       [facility] Adult PT Treatment/Exercise - 07/26/21 1346       Exercises   Exercises Knee/Hip;Other Exercises    Other Exercises  Pt performed 1 set of exercises in HEP Access Code: [email or web address]                     PT Education - 07/26/21 1416     Education Details Access Code: [email or web address]    Person(s) Educated Patient    Methods Explanation;Demonstration;Tactile cues;Verbal cues;Handout    Comprehension Verbalized understanding;Returned demonstration;Verbal cues required;Tactile cues required;Need further instruction              PT Short Term Goals - 07/26/21 1635       PT SHORT TERM GOAL #1   Title Patient is independent with initial HEP.    Time 4    Period Weeks    Status New    Target Date 08/26/21      PT SHORT TERM GOAL #2   Title Left knee PROM 0* ext to 90* flex    Time 4    Period Weeks    Status New    Target Date 08/26/21      PT SHORT TERM GOAL #3   Title Do initial FOTO & set STG and LTG               PT Long Term Goals - 07/26/21 1629       PT LONG TERM GOAL #1   Title set FOTO goal once pt completes    Time 8    Period Weeks    Status New    Target Date 09/23/21      PT LONG TERM GOAL #2   Title left knee pain </= 2/10 with standing & gait activities    Time 8    Period Weeks    Status New    Target Date 09/23/21      PT LONG TERM GOAL #3   Title Left Knee AROM 0* - 100* antigravity    Time 8    Period Weeks    Status New    Target Date 09/23/21      PT LONG TERM GOAL #4   Title Patient ambulates 500', negotiates ramps, curbs & stairs single rail modified independent without  device.    Time 8    Period Weeks    Status New    Target Date 09/23/21                    Plan - 07/26/21 1619     Clinical Impression Statement Pt is a 70y/o female who presents to OPPT s/p left TKA on 07/11/21.  She demonstrates decreased strength, ROM, increased edema and pain with gait abnormalities affecting functional mobility.  Pt will benefit from PT to address deficits listed.    Personal Factors and Comorbidities Comorbidity 3+    Comorbidities arthritis, DDD, lumbar radiofrequency L3-S1 2022, DM2, glaucoma, HTN, lt plantarfacilitis, lt THA, Rt TKA 04 revision 2015, rt TSA 02/16/2021    Examination-Activity Limitations Locomotion Level;Squat;Stairs;Stand;Transfers    Examination-Participation Restrictions Community Activity;Occupation    Stability/Clinical Decision Making Stable/Uncomplicated  Clinical Decision Making Low    Rehab Potential Good    PT Frequency 3x / week   3x/wk for 2 weeks, then 2x/wk for 6 weeks   PT Duration 8 weeks    PT Treatment/Interventions ADLs/Self Care Home Management;Electrical Stimulation;DME Instruction;Gait training;Stair training;Functional mobility training;Therapeutic activities;Therapeutic exercise;Balance training;Neuromuscular re-education;Patient/family education;Manual techniques;Passive range of motion;Vasopneumatic Device;Joint Manipulations;Taping    PT Next Visit Plan check & update HEP, do FOTO, manual therapy & exercises to improve range, vaso to end session    PT Home Exercise Plan Access Code: [email or web address]    Consulted and Agree with Plan of Care Patient             Patient will benefit from skilled therapeutic intervention in order to improve the following deficits and impairments:  Abnormal gait, Decreased activity tolerance, Decreased balance, Decreased endurance, Decreased mobility, Decreased range of motion, Decreased knowledge of use of DME, Decreased scar mobility, Decreased strength, Difficulty walking, Increased  edema, Impaired flexibility, Postural dysfunction, Pain  Visit Diagnosis: Acute pain of left knee  Stiffness of left knee, not elsewhere classified  Muscle weakness (generalized)  Localized edema  Other abnormalities of gait and mobility  Unsteadiness on feet     Problem List Patient Active Problem List   Diagnosis Date Noted   Primary osteoarthritis of left knee 07/11/2021   Status post total left knee replacement 07/11/2021   DJD (degenerative joint disease) of knee 07/11/2021   Great toe pain, left 06/08/2021   Sleep apnea 05/24/2021   Low grade fever 01/17/2020   Frequent urination 10/14/2019   OAB (overactive bladder) 10/14/2019   Trigger middle finger of right hand 11/12/2018   Spondylosis of cervical region without myelopathy or radiculopathy 01/14/2018   Chronic left shoulder pain 01/08/2018   History of bilateral hip replacements 07/19/2017   Rotator cuff tear 02/02/2017   Acquired renal cyst of left kidney 01/01/2016   Osteoarthritis of spine with radiculopathy, 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   DDD (degenerative disc disease), lumbar 03/04/2014   Lumbar facet joint pain 02/24/2014   IBS (irritable bowel syndrome) 05/13/2013   Obesity 11/13/2012   Glaucoma    Type 2 diabetes mellitus, controlled ([place])    Anxiety and depression    Vitreous degeneration, unspecified eye 10/25/2012   HTN (hypertension) 01/06/2012   GERD (gastroesophageal reflux disease) 01/06/2012   Hypercholesteremia 01/06/2012    Jamey Reas, PT, DPT 07/26/2021, 4:38 PM  [facility] [address] [place], [place], [address] Phone: [phone]   Fax:  [phone]  Name: KYANA AICHER MRN: [id number] Date of Birth: Nov 21, 1950

## 2021-08-09 ENCOUNTER — Ambulatory Visit (INDEPENDENT_AMBULATORY_CARE_PROVIDER_SITE_OTHER): Payer: Medicare Other | Admitting: Physical Therapy

## 2021-08-09 ENCOUNTER — Other Ambulatory Visit: Payer: Self-pay

## 2021-08-09 ENCOUNTER — Encounter: Payer: Self-pay | Admitting: Physical Therapy

## 2021-08-09 DIAGNOSIS — R600 Localized edema: Secondary | ICD-10-CM | POA: Diagnosis not present

## 2021-08-09 DIAGNOSIS — M25662 Stiffness of left knee, not elsewhere classified: Secondary | ICD-10-CM | POA: Diagnosis not present

## 2021-08-09 DIAGNOSIS — M25562 Pain in left knee: Secondary | ICD-10-CM | POA: Diagnosis not present

## 2021-08-09 DIAGNOSIS — R2689 Other abnormalities of gait and mobility: Secondary | ICD-10-CM

## 2021-08-09 DIAGNOSIS — M6281 Muscle weakness (generalized): Secondary | ICD-10-CM

## 2021-08-09 NOTE — Therapy (Signed)
[facility] [address] [place], [place], [address] Phone: [phone]   Fax:  [phone]  Physical Therapy Treatment  Patient Details  Name: Melissa [name] MRN: [id number] Date of Birth: 05-Apr-1951 Referring Provider (PT): Frankey Shown, MD   Encounter Date: 08/09/2021   PT End of Session - 08/09/21 1331     Visit Number 2    Number of Visits 18    Date for PT Re-Evaluation 09/23/21    Authorization Type UHC Medicare    Progress Note Due on Visit 10    PT Start Time 1300    PT Stop Time 1345    PT Time Calculation (min) 45 min    Activity Tolerance Patient tolerated treatment well    Behavior During Therapy [facility] for tasks assessed/performed             Past Medical History:  Diagnosis Date   Allergy    Anxiety    Arthritis    s/p R TKR   Cyst of left kidney 2015   by lumbar MRI pending renal US   DDD (degenerative disc disease), lumbar 03/2014   severe L2-3 with L HNP with L2/3 nerve root impingement [name])   Depression with anxiety    Diabetes type 2, controlled ([place]) 2011   borderline   Glaucoma    History of ulcer disease    HLD (hyperlipidemia)    no meds taken   Hypertension    Plantar fasciitis of left foot 10/01/2013   PONV (postoperative nausea and vomiting)    Right-sided face pain 01/07/2019   Sleep apnea 2022    Past Surgical History:  Procedure Laterality Date   ABDOMINAL HYSTERECTOMY  2007   fibroids, heavy bleeding   ARTERY BIOPSY Right 01/23/2019   Procedure: REMOVAL OF PART OF RIGHT TEMPORAL ARTERY FOR BIOPSY;  Surgeon: Michael Boston, MD;  Location: [place];  Service: General;  Laterality: Right;   CATARACT EXTRACTION W/ INTRAOCULAR LENS IMPLANT Bilateral    COLONOSCOPY  04/2017   TA, diverticulosis, rpt ? ([facility])   GLAUCOMA SURGERY     HAMMER TOE SURGERY Bilateral    Right hip replacement     TONSILLECTOMY     TOTAL HIP ARTHROPLASTY Left 2021   TOTAL KNEE ARTHROPLASTY Right 2004   TKR (Dr.  Eddie Dibbles with [facility])   TOTAL KNEE ARTHROPLASTY Left 07/11/2021   Procedure: LEFT TOTAL KNEE ARTHROPLASTY;  Surgeon: Leandrew Koyanagi, MD;  Location: [place];  Service: Orthopedics;  Laterality: Left;   TOTAL KNEE REVISION Right 03/2014   Dr Al Corpus [place] Right 2022    There were no vitals filed for this visit.   Subjective Assessment - 08/09/21 1306     Subjective 'she relays about 1-2/10 pain in Lt knee upon arrival, she relays she has been working on [employer]. She still likes to wear the ace wrap because she feels it makes her knee feel better    Pertinent History arthritis, DDD, lumbar radiofrequency L3-S1 2022, DM2, glaucoma, HTN, lt plantarfacilitis, lt THA, Rt TKA 04 revision 2015, rt TSA 02/16/2021    Patient Stated Goals get back to working her part-time job by 12/03/2021, walk in community    Pain Onset 1 to 4 weeks ago                [facility] PT Assessment - 08/09/21 0001       Assessment   Medical Diagnosis M17.12 (ICD-10-CM) - Primary osteoarthritis  of left knee & Z96.652 (ICD-10-CM) - Status post total left knee replacement    Referring Provider (PT) Frankey Shown, MD    Onset Date/Surgical Date 07/11/21      Observation/Other Assessments   Focus on Therapeutic Outcomes (FOTO)  50% functional intake, goal is 67%                           [facility] Adult PT Treatment/Exercise - 08/09/21 0001       Knee/Hip Exercises: Stretches   Active Hamstring Stretch Left;3 reps;30 seconds    Active Hamstring Stretch Limitations seated    Knee: Self-Stretch Limitations seated tail gate stretch 5 sec X15      Knee/Hip Exercises: Aerobic   Nustep L5 X 8 min UE/LE      Knee/Hip Exercises: Seated   Long Arc Quad Left;20 reps    Long Arc Quad Weight 2 lbs.    Long Arc [employer] Limitations --    Sit to [employer] 10 reps;without UE support   slightly raised mat surface     Modalities   Modalities Vasopneumatic      Vasopneumatic   Number Minutes  Vasopneumatic  10 minutes    Vasopnuematic Location  Knee    Vasopneumatic Pressure Medium    Vasopneumatic Temperature  34      Manual Therapy   Manual therapy comments Lt knee PROM in sitting                       PT Short Term Goals - 08/09/21 1317       PT SHORT TERM GOAL #1   Title Patient is independent with initial HEP.    Time 4    Period Weeks    Status On-going    Target Date 08/26/21      PT SHORT TERM GOAL #2   Title Left knee PROM 0* ext to 90* flex    Time 4    Period Weeks    Status On-going    Target Date 08/26/21      PT SHORT TERM GOAL #3   Title Do initial FOTO & set STG and LTG    Baseline met 12/6    Status Achieved               PT Long Term Goals - 08/09/21 1317       PT LONG TERM GOAL #1   Title She will improve FOTO to at least 67% functonal intake    Time 8    Period Weeks    Status New    Target Date 09/23/21      PT LONG TERM GOAL #2   Title left knee pain </= 2/10 with standing & gait activities    Time 8    Period Weeks    Status On-going    Target Date 09/23/21      PT LONG TERM GOAL #3   Title Left Knee AROM 0* - 100* antigravity    Time 8    Period Weeks    Status On-going    Target Date 09/23/21      PT LONG TERM GOAL #4   Title Patient ambulates 500', negotiates ramps, curbs & stairs single rail modified independent without device.    Time 8    Period Weeks    Status On-going    Target Date 09/23/21  Plan - 08/09/21 1332     Clinical Impression Statement Session focused on gentle ROM and strengthening to her tolerance for Lt knee S/P TKA. She does still have some edema and uses compression wrap for this, we applied Vasopnuematic to her knee as well at end of session. We captured FOTO funcitonal intake score and wrote new long term goal for predicted improved score for this.    Personal Factors and Comorbidities Comorbidity 3+    Comorbidities arthritis, DDD, lumbar  radiofrequency L3-S1 2022, DM2, glaucoma, HTN, lt plantarfacilitis, lt THA, Rt TKA 04 revision 2015, rt TSA 02/16/2021    Examination-Activity Limitations Locomotion Level;Squat;Stairs;Stand;Transfers    Examination-Participation Restrictions Community Activity;Occupation    Stability/Clinical Decision Making Stable/Uncomplicated    Rehab Potential Good    PT Frequency 3x / week   3x/wk for 2 weeks, then 2x/wk for 6 weeks   PT Duration 8 weeks    PT Treatment/Interventions ADLs/Self Care Home Management;Electrical Stimulation;DME Instruction;Gait training;Stair training;Functional mobility training;Therapeutic activities;Therapeutic exercise;Balance training;Neuromuscular re-education;Patient/family education;Manual techniques;Passive range of motion;Vasopneumatic Device;Joint Manipulations;Taping    PT Next Visit Plan how was soreness from last visit    PT Home Exercise Plan Access Code: [email or web address]    Consulted and Agree with Plan of Care Patient             Patient will benefit from skilled therapeutic intervention in order to improve the following deficits and impairments:  Abnormal gait, Decreased activity tolerance, Decreased balance, Decreased endurance, Decreased mobility, Decreased range of motion, Decreased knowledge of use of DME, Decreased scar mobility, Decreased strength, Difficulty walking, Increased edema, Impaired flexibility, Postural dysfunction, Pain  Visit Diagnosis: Acute pain of left knee  Stiffness of left knee, not elsewhere classified  Muscle weakness (generalized)  Localized edema  Other abnormalities of gait and mobility     Problem List Patient Active Problem List   Diagnosis Date Noted   Primary osteoarthritis of left knee 07/11/2021   Status post total left knee replacement 07/11/2021   DJD (degenerative joint disease) of knee 07/11/2021   Great toe pain, left 06/08/2021   Sleep apnea 05/24/2021   Low grade fever 01/17/2020   Frequent urination  10/14/2019   OAB (overactive bladder) 10/14/2019   Trigger middle finger of right hand 11/12/2018   Spondylosis of cervical region without myelopathy or radiculopathy 01/14/2018   Chronic left shoulder pain 01/08/2018   History of bilateral hip replacements 07/19/2017   Rotator cuff tear 02/02/2017   Acquired renal cyst of left kidney 01/01/2016   Osteoarthritis of spine with radiculopathy, 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   DDD (degenerative disc disease), lumbar 03/04/2014   Lumbar facet joint pain 02/24/2014   IBS (irritable bowel syndrome) 05/13/2013   Obesity 11/13/2012   Glaucoma    Type 2 diabetes mellitus, controlled ([place])    Anxiety and depression    Vitreous degeneration, unspecified eye 10/25/2012   HTN (hypertension) 01/06/2012   GERD (gastroesophageal reflux disease) 01/06/2012   Hypercholesteremia 01/06/2012    Debbe Odea, PT,DPT 08/09/2021, 1:35 PM  [facility] [address] [place], [place], [address] Phone: [phone]   Fax:  [phone]  Name: SARAHANNE NOVAKOWSKI MRN: [id number] Date of Birth: Oct 28, 1950

## 2021-08-11 ENCOUNTER — Ambulatory Visit (INDEPENDENT_AMBULATORY_CARE_PROVIDER_SITE_OTHER): Payer: Medicare Other | Admitting: Physical Therapy

## 2021-08-11 ENCOUNTER — Other Ambulatory Visit: Payer: Self-pay

## 2021-08-11 ENCOUNTER — Encounter: Payer: Self-pay | Admitting: Physical Therapy

## 2021-08-11 DIAGNOSIS — R600 Localized edema: Secondary | ICD-10-CM | POA: Diagnosis not present

## 2021-08-11 DIAGNOSIS — R2681 Unsteadiness on feet: Secondary | ICD-10-CM

## 2021-08-11 DIAGNOSIS — R2689 Other abnormalities of gait and mobility: Secondary | ICD-10-CM

## 2021-08-11 DIAGNOSIS — M25562 Pain in left knee: Secondary | ICD-10-CM

## 2021-08-11 DIAGNOSIS — M25662 Stiffness of left knee, not elsewhere classified: Secondary | ICD-10-CM

## 2021-08-11 DIAGNOSIS — M6281 Muscle weakness (generalized): Secondary | ICD-10-CM

## 2021-08-11 NOTE — Therapy (Signed)
[facility] [address] [place], [place], [address] Phone: [phone]   Fax:  [phone]  Physical Therapy Treatment  Patient Details  Name: Melissa [name] MRN: [id number] Date of Birth: Oct 31, 1950 Referring Provider (PT): Frankey Shown, MD   Encounter Date: 08/11/2021   PT End of Session - 08/11/21 1407     Visit Number 3    Number of Visits 18    Date for PT Re-Evaluation 09/23/21    Authorization Type UHC Medicare    Progress Note Due on Visit 10    PT Start Time 1340    PT Stop Time 1425    PT Time Calculation (min) 45 min    Activity Tolerance Patient tolerated treatment well    Behavior During Therapy [facility] for tasks assessed/performed             Past Medical History:  Diagnosis Date   Allergy    Anxiety    Arthritis    s/p R TKR   Cyst of left kidney 2015   by lumbar MRI pending renal US   DDD (degenerative disc disease), lumbar 03/2014   severe L2-3 with L HNP with L2/3 nerve root impingement [name])   Depression with anxiety    Diabetes type 2, controlled ([place]) 2011   borderline   Glaucoma    History of ulcer disease    HLD (hyperlipidemia)    no meds taken   Hypertension    Plantar fasciitis of left foot 10/01/2013   PONV (postoperative nausea and vomiting)    Right-sided face pain 01/07/2019   Sleep apnea 2022    Past Surgical History:  Procedure Laterality Date   ABDOMINAL HYSTERECTOMY  2007   fibroids, heavy bleeding   ARTERY BIOPSY Right 01/23/2019   Procedure: REMOVAL OF PART OF RIGHT TEMPORAL ARTERY FOR BIOPSY;  Surgeon: Michael Boston, MD;  Location: [place];  Service: General;  Laterality: Right;   CATARACT EXTRACTION W/ INTRAOCULAR LENS IMPLANT Bilateral    COLONOSCOPY  04/2017   TA, diverticulosis, rpt ? ([facility])   GLAUCOMA SURGERY     HAMMER TOE SURGERY Bilateral    Right hip replacement     TONSILLECTOMY     TOTAL HIP ARTHROPLASTY Left 2021   TOTAL KNEE ARTHROPLASTY Right 2004   TKR (Dr.  Eddie Dibbles with [facility])   TOTAL KNEE ARTHROPLASTY Left 07/11/2021   Procedure: LEFT TOTAL KNEE ARTHROPLASTY;  Surgeon: Leandrew Koyanagi, MD;  Location: [place];  Service: Orthopedics;  Laterality: Left;   TOTAL KNEE REVISION Right 03/2014   Dr Al [place] Right 2022    There were no vitals filed for this visit.   Subjective Assessment - 08/11/21 1343     Subjective she relays about 1/10 pain overall today in her Lt knee upon arrival.    Pertinent History arthritis, DDD, lumbar radiofrequency L3-S1 2022, DM2, glaucoma, HTN, lt plantarfacilitis, lt THA, Rt TKA 04 revision 2015, rt TSA 02/16/2021    Patient Stated Goals get back to working her part-time job by 12/03/2021, walk in community    Pain Onset 1 to 4 weeks ago                               [facility] Adult PT Treatment/Exercise - 08/11/21 0001       Ambulation/Gait   Ambulation/Gait Yes    Ambulation/Gait Assistance 5: Supervision    Ambulation/Gait  Assistance Details --   progressed off SPC to no AD   Ambulation Distance (Feet) 100 Feet      Knee/Hip Exercises: Stretches   Active Hamstring Stretch Left;2 reps;30 seconds    Knee: Self-Stretch Limitations seated tail gate stretch 5 sec X15      Knee/Hip Exercises: Aerobic   Nustep L5 X 8 min LE      Knee/Hip Exercises: Seated   Long Arc Quad Left;20 reps    Long Arc Quad Limitations 2.5 lbs    Hamstring Curl Left;15 reps    Hamstring Limitations green    Sit to [employer] 10 reps;without UE support      Knee/Hip Exercises: Supine   Straight Leg Raises Left;15 reps      Vasopneumatic   Number Minutes Vasopneumatic  7 minutes    Vasopnuematic Location  Knee    Vasopneumatic Pressure Medium    Vasopneumatic Temperature  34      Manual Therapy   Manual therapy comments Lt knee PROM in supine                       PT Short Term Goals - 08/09/21 1317       PT SHORT TERM GOAL #1   Title Patient is independent with  initial HEP.    Time 4    Period Weeks    Status On-going    Target Date 08/26/21      PT SHORT TERM GOAL #2   Title Left knee PROM 0* ext to 90* flex    Time 4    Period Weeks    Status On-going    Target Date 08/26/21      PT SHORT TERM GOAL #3   Title Do initial FOTO & set STG and LTG    Baseline met 12/6    Status Achieved               PT Long Term Goals - 08/09/21 1317       PT LONG TERM GOAL #1   Title She will improve FOTO to at least 67% functonal intake    Time 8    Period Weeks    Status New    Target Date 09/23/21      PT LONG TERM GOAL #2   Title left knee pain </= 2/10 with standing & gait activities    Time 8    Period Weeks    Status On-going    Target Date 09/23/21      PT LONG TERM GOAL #3   Title Left Knee AROM 0* - 100* antigravity    Time 8    Period Weeks    Status On-going    Target Date 09/23/21      PT LONG TERM GOAL #4   Title Patient ambulates 500', negotiates ramps, curbs & stairs single rail modified independent without device.    Time 8    Period Weeks    Status On-going    Target Date 09/23/21                   Plan - 08/11/21 1411     Clinical Impression Statement She is improving with PT and able to progress from [facility] to no AD for ambulation but still with overall supervision due to some gait unsteadiness. We will continue to work to improve this and her knee ROM and strength as tolerated.    Personal Factors and Comorbidities Comorbidity 3+  Comorbidities arthritis, DDD, lumbar radiofrequency L3-S1 2022, DM2, glaucoma, HTN, lt plantarfacilitis, lt THA, Rt TKA 04 revision 2015, rt TSA 02/16/2021    Examination-Activity Limitations Locomotion Level;Squat;Stairs;Stand;Transfers    Examination-Participation Restrictions Community Activity;Occupation    Stability/Clinical Decision Making Stable/Uncomplicated    Rehab Potential Good    PT Frequency 3x / week   3x/wk for 2 weeks, then 2x/wk for 6 weeks   PT  Duration 8 weeks    PT Treatment/Interventions ADLs/Self Care Home Management;Electrical Stimulation;DME Instruction;Gait training;Stair training;Functional mobility training;Therapeutic activities;Therapeutic exercise;Balance training;Neuromuscular re-education;Patient/family education;Manual techniques;Passive range of motion;Vasopneumatic Device;Joint Manipulations;Taping    PT Next Visit Plan gait without AD, balance, Lt knee ROM and strength    PT Home Exercise Plan Access Code: [email or web address]    Consulted and Agree with Plan of Care Patient             Patient will benefit from skilled therapeutic intervention in order to improve the following deficits and impairments:  Abnormal gait, Decreased activity tolerance, Decreased balance, Decreased endurance, Decreased mobility, Decreased range of motion, Decreased knowledge of use of DME, Decreased scar mobility, Decreased strength, Difficulty walking, Increased edema, Impaired flexibility, Postural dysfunction, Pain  Visit Diagnosis: Acute pain of left knee  Stiffness of left knee, not elsewhere classified  Muscle weakness (generalized)  Localized edema  Other abnormalities of gait and mobility  Unsteadiness on feet     Problem List Patient Active Problem List   Diagnosis Date Noted   Primary osteoarthritis of left knee 07/11/2021   Status post total left knee replacement 07/11/2021   DJD (degenerative joint disease) of knee 07/11/2021   Great toe pain, left 06/08/2021   Sleep apnea 05/24/2021   Low grade fever 01/17/2020   Frequent urination 10/14/2019   OAB (overactive bladder) 10/14/2019   Trigger middle finger of right hand 11/12/2018   Spondylosis of cervical region without myelopathy or radiculopathy 01/14/2018   Chronic left shoulder pain 01/08/2018   History of bilateral hip replacements 07/19/2017   Rotator cuff tear 02/02/2017   Acquired renal cyst of left kidney 01/01/2016   Osteoarthritis of spine with  radiculopathy, 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   DDD (degenerative disc disease), lumbar 03/04/2014   Lumbar facet joint pain 02/24/2014   IBS (irritable bowel syndrome) 05/13/2013   Obesity 11/13/2012   Glaucoma    Type 2 diabetes mellitus, controlled ([place])    Anxiety and depression    Vitreous degeneration, unspecified eye 10/25/2012   HTN (hypertension) 01/06/2012   GERD (gastroesophageal reflux disease) 01/06/2012   Hypercholesteremia 01/06/2012    Debbe Odea, PT,DPT 08/11/2021, 2:13 PM  [facility] [address] [place], [place], [address] Phone: [phone]   Fax:  [phone]  Name: Melissa [name] MRN: [id number] Date of Birth: Feb 12, 1951

## 2021-08-15 ENCOUNTER — Ambulatory Visit (INDEPENDENT_AMBULATORY_CARE_PROVIDER_SITE_OTHER): Payer: Medicare Other | Admitting: Physical Therapy

## 2021-08-15 ENCOUNTER — Other Ambulatory Visit: Payer: Self-pay

## 2021-08-15 ENCOUNTER — Encounter: Payer: Self-pay | Admitting: Physical Therapy

## 2021-08-15 DIAGNOSIS — M25562 Pain in left knee: Secondary | ICD-10-CM

## 2021-08-15 DIAGNOSIS — R2689 Other abnormalities of gait and mobility: Secondary | ICD-10-CM

## 2021-08-15 DIAGNOSIS — M25662 Stiffness of left knee, not elsewhere classified: Secondary | ICD-10-CM | POA: Diagnosis not present

## 2021-08-15 DIAGNOSIS — R600 Localized edema: Secondary | ICD-10-CM

## 2021-08-15 DIAGNOSIS — M6281 Muscle weakness (generalized): Secondary | ICD-10-CM

## 2021-08-15 DIAGNOSIS — R2681 Unsteadiness on feet: Secondary | ICD-10-CM

## 2021-08-15 NOTE — Therapy (Signed)
[facility] [address] [place], [place], [address] Phone: [phone]   Fax:  [phone]  Physical Therapy Treatment  Patient Details  Name: Melissa [name] MRN: [id number] Date of Birth: [birth year] Referring Provider (PT): Frankey Shown, MD   Encounter Date: 08/15/2021   PT End of Session - 08/15/21 1259     Visit Number 4    Number of Visits 18    Date for PT Re-Evaluation 09/23/21    Authorization Type UHC Medicare    Progress Note Due on Visit 10    PT Start Time 1259    PT Stop Time 1351    PT Time Calculation (min) 52 min    Activity Tolerance Patient tolerated treatment well    Behavior During Therapy [facility] for tasks assessed/performed             Past Medical History:  Diagnosis Date   Allergy    Anxiety    Arthritis    s/p R TKR   Cyst of left kidney 2015   by lumbar MRI pending renal US   DDD (degenerative disc disease), lumbar 03/2014   severe L2-3 with L HNP with L2/3 nerve root impingement [name])   Depression with anxiety    Diabetes type 2, controlled ([place]) 2011   borderline   Glaucoma    History of ulcer disease    HLD (hyperlipidemia)    no meds taken   Hypertension    Plantar fasciitis of left foot 10/01/2013   PONV (postoperative nausea and vomiting)    Right-sided face pain 01/07/2019   Sleep apnea 2022    Past Surgical History:  Procedure Laterality Date   ABDOMINAL HYSTERECTOMY  2007   fibroids, heavy bleeding   ARTERY BIOPSY Right 01/23/2019   Procedure: REMOVAL OF PART OF RIGHT TEMPORAL ARTERY FOR BIOPSY;  Surgeon: Michael [facility], MD;  Location: [place];  Service: General;  Laterality: Right;   CATARACT EXTRACTION W/ INTRAOCULAR LENS IMPLANT Bilateral    COLONOSCOPY  04/2017   TA, diverticulosis, rpt ? ([facility])   GLAUCOMA SURGERY     HAMMER TOE SURGERY Bilateral    Right hip replacement     TONSILLECTOMY     TOTAL HIP ARTHROPLASTY Left 2021   TOTAL KNEE ARTHROPLASTY Right 2004   TKR  (Dr. Eddie Dibbles with [facility])   TOTAL KNEE ARTHROPLASTY Left 07/11/2021   Procedure: LEFT TOTAL KNEE ARTHROPLASTY;  Surgeon: Leandrew Koyanagi, MD;  Location: [place];  Service: Orthopedics;  Laterality: Left;   TOTAL KNEE REVISION Right 03/2014   Dr Al [place] Right 2022    There were no vitals filed for this visit.   Subjective Assessment - 08/15/21 1258     Subjective She has been doing her exercises.  She reports walking around house most of time without device.    Pertinent History arthritis, DDD, lumbar radiofrequency L3-S1 2022, DM2, glaucoma, HTN, lt plantarfacilitis, lt THA, Rt TKA 04 revision 2015, rt TSA 02/16/2021    Patient Stated Goals get back to working her part-time job by 12/03/2021, walk in community    Currently in Pain? Yes    Pain Score 1    Since last PT appt, lowest 0/10 - highest 2/10   Pain Location Knee    Pain Orientation Left    Pain Descriptors / Indicators Aching;Sore    Pain Type Surgical pain    Pain Onset More than a month ago    Pain  Frequency Intermittent    Aggravating Factors  stairs    Pain Relieving Factors rest, ice, tylenol    Effect of Pain on Daily Activities time standing & walking                [facility] PT Assessment - 08/15/21 1258       Assessment   Medical Diagnosis M17.12 (ICD-10-CM) - Primary osteoarthritis of left knee & Z96.652 (ICD-10-CM) - Status post total left knee replacement    Referring Provider (PT) Naiping Xu, MD    Onset Date/Surgical Date 07/11/21      AROM   Left Knee Flexion 103   seated after manual therapy                          [facility] Adult PT Treatment/Exercise - 08/15/21 1258       Ambulation/Gait   Ambulation/Gait Yes    Ambulation/Gait Assistance 5: Supervision    Ambulation Distance (Feet) 100 Feet      Knee/Hip Exercises: Stretches   Active Hamstring Stretch Left;2 reps;30 seconds    Active Hamstring Stretch Limitations seated w/strap DF    Knee:  Self-Stretch Limitations --    Gastroc Stretch Both;2 reps;30 seconds    Gastroc Stretch Limitations slant board      Knee/Hip Exercises: Aerobic   Stationary Bike Sci FIt seat 10 level 1 for 8 min    Nustep --      Knee/Hip Exercises: Machines for Strengthening   Total Gym Leg Press BLEs 100# 15 reps 2 sets      Knee/Hip Exercises: Standing   Forward Step Up Both;1 set;10 reps;Hand Hold: 1;Step Height: 6"    Forward Step Up Limitations demo & verbal cues on technique    Step Down Both;1 set;10 reps;Hand Hold: 2;Hand Hold: 1;Step Height: 6"   down with LLE so RLE eccentric 1 hand rail and down with RLE so LLE eccentric hand rail + HHA   Step Down Limitations demo & verbal cues on technique      Knee/Hip Exercises: Seated   Long Arc Quad Left;20 reps    Long Arc Quad Limitations 3 lbs    Hamstring Curl Left;15 reps    Hamstring Limitations green    Sit to [place] 10 reps;without UE support   demo & verbal cues on technique     Knee/Hip Exercises: Supine   Straight Leg Raises --      Vasopneumatic   Number Minutes Vasopneumatic  7 minutes    Vasopnuematic Location  Knee    Vasopneumatic Pressure Medium    Vasopneumatic Temperature  34      Manual Therapy   Manual Therapy Joint mobilization    Manual therapy comments --    Joint Mobilization light traction with tibial int rot with knee flexion and contralateral LAQ,                       PT Short Term Goals - 08/09/21 1317       PT SHORT TERM GOAL #1   Title Patient is independent with initial HEP.    Time 4    Period Weeks    Status On-going    Target Date 08/26/21      PT SHORT TERM GOAL #2   Title Left knee PROM 0* ext to 90* flex    Time 4    Period Weeks    Status On-going    Target   Date 08/26/21      PT SHORT TERM GOAL #3   Title Do initial FOTO & set STG and LTG    Baseline met 12/6    Status Achieved               PT Long Term Goals - 08/09/21 1317       PT LONG TERM GOAL #1    Title She will improve FOTO to at least 67% functonal intake    Time 8    Period Weeks    Status New    Target Date 09/23/21      PT LONG TERM GOAL #2   Title left knee pain </= 2/10 with standing & gait activities    Time 8    Period Weeks    Status On-going    Target Date 09/23/21      PT LONG TERM GOAL #3   Title Left Knee AROM 0* - 100* antigravity    Time 8    Period Weeks    Status On-going    Target Date 09/23/21      PT LONG TERM GOAL #4   Title Patient ambulates 500', negotiates ramps, curbs & stairs single rail modified independent without device.    Time 8    Period Weeks    Status On-going    Target Date 09/23/21                   Plan - 08/15/21 1259     Clinical Impression Statement PT was able to progress exercises today to bike from Nustep, leg press & step up/down.  Her PROM knee flexion improved.  Pt is on target to meet STGs by end of next week.  pt continues to benefit from skilled PT.    Personal Factors and Comorbidities Comorbidity 3+    Comorbidities arthritis, DDD, lumbar radiofrequency L3-S1 2022, DM2, glaucoma, HTN, lt plantarfacilitis, lt THA, Rt TKA 04 revision 2015, rt TSA 02/16/2021    Examination-Activity Limitations Locomotion Level;Squat;Stairs;Stand;Transfers    Examination-Participation Restrictions Community Activity;Occupation    Stability/Clinical Decision Making Stable/Uncomplicated    Rehab Potential Good    PT Frequency 3x / week   3x/wk for 2 weeks, then 2x/wk for 6 weeks   PT Duration 8 weeks    PT Treatment/Interventions ADLs/Self Care Home Management;Electrical Stimulation;DME Instruction;Gait training;Stair training;Functional mobility training;Therapeutic activities;Therapeutic exercise;Balance training;Neuromuscular re-education;Patient/family education;Manual techniques;Passive range of motion;Vasopneumatic Device;Joint Manipulations;Taping    PT Next Visit Plan gait without AD, balance, Lt knee ROM and strength  including standing functional exercises. educate on stairs with rail.    PT Home Exercise Plan Access Code: [email or web address]    Consulted and Agree with Plan of Care Patient             Patient will benefit from skilled therapeutic intervention in order to improve the following deficits and impairments:  Abnormal gait, Decreased activity tolerance, Decreased balance, Decreased endurance, Decreased mobility, Decreased range of motion, Decreased knowledge of use of DME, Decreased scar mobility, Decreased strength, Difficulty walking, Increased edema, Impaired flexibility, Postural dysfunction, Pain  Visit Diagnosis: Acute pain of left knee  Stiffness of left knee, not elsewhere classified  Muscle weakness (generalized)  Localized edema  Other abnormalities of gait and mobility  Unsteadiness on feet     Problem List Patient Active Problem List   Diagnosis Date Noted   Primary osteoarthritis of left knee 07/11/2021   Status post total left knee replacement 07/11/2021   DJD (degenerative joint   disease) of knee 07/11/2021   Great toe pain, left 06/08/2021   Sleep apnea 05/24/2021   Low grade fever 01/17/2020   Frequent urination 10/14/2019   OAB (overactive bladder) 10/14/2019   Trigger middle finger of right hand 11/12/2018   Spondylosis of cervical region without myelopathy or radiculopathy 01/14/2018   Chronic left shoulder pain 01/08/2018   History of bilateral hip replacements 07/19/2017   Rotator cuff tear 02/02/2017   Acquired renal cyst of left kidney 01/01/2016   Osteoarthritis of spine with radiculopathy, 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   DDD (degenerative disc disease), lumbar 03/04/2014   Lumbar facet joint pain 02/24/2014   IBS (irritable bowel syndrome) 05/13/2013   Obesity 11/13/2012   Glaucoma    Type 2 diabetes mellitus, controlled ([place])    Anxiety and depression    Vitreous degeneration, unspecified eye 10/25/2012   HTN (hypertension) 01/06/2012   GERD  (gastroesophageal reflux disease) 01/06/2012   Hypercholesteremia 01/06/2012    Jamey Reas, PT, DPT 08/15/2021, 1:45 PM  [facility] [address] [place], [place], [address] Phone: [phone]   Fax:  [phone]  Name: Melissa [name] MRN: [id number] Date of Birth: 04-11-1951

## 2021-08-17 ENCOUNTER — Ambulatory Visit (INDEPENDENT_AMBULATORY_CARE_PROVIDER_SITE_OTHER): Payer: Medicare Other | Admitting: Physical Therapy

## 2021-08-17 ENCOUNTER — Other Ambulatory Visit: Payer: Self-pay

## 2021-08-17 DIAGNOSIS — M6281 Muscle weakness (generalized): Secondary | ICD-10-CM | POA: Diagnosis not present

## 2021-08-17 DIAGNOSIS — M25662 Stiffness of left knee, not elsewhere classified: Secondary | ICD-10-CM

## 2021-08-17 DIAGNOSIS — M25562 Pain in left knee: Secondary | ICD-10-CM | POA: Diagnosis not present

## 2021-08-17 DIAGNOSIS — R2689 Other abnormalities of gait and mobility: Secondary | ICD-10-CM

## 2021-08-17 DIAGNOSIS — R600 Localized edema: Secondary | ICD-10-CM | POA: Diagnosis not present

## 2021-08-17 NOTE — Therapy (Signed)
[facility] Physical Therapy [address] [place], [place], [address] Phone: [phone]   Fax:  [phone]  Physical Therapy Treatment  Patient Details  Name: Melissa [name] MRN: [id number] Date of Birth: 1951-02-18 Referring Provider (PT): Frankey Shown, MD   Encounter Date: 08/17/2021   PT End of Session - 08/17/21 1326     Visit Number 5    Number of Visits 18    Date for PT Re-Evaluation 09/23/21    Authorization Type UHC Medicare    Progress Note Due on Visit 10    PT Start Time 0100    PT Stop Time 0145    PT Time Calculation (min) 45 min    Activity Tolerance Patient tolerated treatment well    Behavior During Therapy [facility] for tasks assessed/performed             Past Medical History:  Diagnosis Date   Allergy    Anxiety    Arthritis    s/p R TKR   Cyst of left kidney 2015   by lumbar MRI pending renal US   DDD (degenerative disc disease), lumbar 03/2014   severe L2-3 with L HNP with L2/3 nerve root impingement [name])   Depression with anxiety    Diabetes type 2, controlled ([place]) 2011   borderline   Glaucoma    History of ulcer disease    HLD (hyperlipidemia)    no meds taken   Hypertension    Plantar fasciitis of left foot 10/01/2013   PONV (postoperative nausea and vomiting)    Right-sided face pain 01/07/2019   Sleep apnea 2022    Past Surgical History:  Procedure Laterality Date   ABDOMINAL HYSTERECTOMY  2007   fibroids, heavy bleeding   ARTERY BIOPSY Right 01/23/2019   Procedure: REMOVAL OF PART OF RIGHT TEMPORAL ARTERY FOR BIOPSY;  Surgeon: Michael Boston, MD;  Location: [place];  Service: General;  Laterality: Right;   CATARACT EXTRACTION W/ INTRAOCULAR LENS IMPLANT Bilateral    COLONOSCOPY  04/2017   TA, diverticulosis, rpt ? ([facility])   GLAUCOMA SURGERY     HAMMER TOE SURGERY Bilateral    Right hip replacement     TONSILLECTOMY     TOTAL HIP ARTHROPLASTY Left 2021   TOTAL KNEE ARTHROPLASTY Right 2004   TKR  (Dr. Eddie Dibbles with [facility])   TOTAL KNEE ARTHROPLASTY Left 07/11/2021   Procedure: LEFT TOTAL KNEE ARTHROPLASTY;  Surgeon: Leandrew Koyanagi, MD;  Location: [place];  Service: Orthopedics;  Laterality: Left;   TOTAL KNEE REVISION Right 03/2014   Dr Al [place] Right 2022    There were no vitals filed for this visit.   Subjective Assessment - 08/17/21 1306     Subjective 1/10 overall knee pain, she is having continued stiffness in her knee    Pertinent History arthritis, DDD, lumbar radiofrequency L3-S1 2022, DM2, glaucoma, HTN, lt plantarfacilitis, lt THA, Rt TKA 04 revision 2015, rt TSA 02/16/2021    Patient Stated Goals get back to working her part-time job by 12/03/2021, walk in community    Pain Onset More than a month ago                [facility] PT Assessment - 08/17/21 0001       Assessment   Medical Diagnosis M17.12 (ICD-10-CM) - Primary osteoarthritis of left knee & D35.701 (ICD-10-CM) - Status post total left knee replacement    Referring Provider (PT) Frankey Shown, MD  Onset Date/Surgical Date 07/11/21      AROM   Left Knee Extension -3    Left Knee Flexion 108   supine     PROM   Left Knee Extension -1    Left Knee Flexion 115   supine after manual therapy                          [facility] Adult PT Treatment/Exercise - 08/17/21 0001       Ambulation/Gait   Ambulation/Gait Yes    Ambulation/Gait Assistance 5: Supervision    Ambulation Distance (Feet) 100 Feet    Assistive device None    Gait Comments can ambulate within clinic without AD but still prefers small quad cane for community ambulation      Knee/Hip Exercises: Stretches   Passive Hamstring Stretch Left;3 reps;30 seconds    Knee: Self-Stretch Limitations standing lunge stretch with Lt foot on treadmill using rails for UE support 5 sec X 10. Seated tailgate stretch 5 sec X10    Gastroc Stretch Both;2 reps;30 seconds    Gastroc Stretch Limitations slant board       Knee/Hip Exercises: Aerobic   Recumbent Bike 8 min L1 seat #7      Knee/Hip Exercises: Machines for Strengthening   Total Gym Leg Press BLEs 100# 15 reps 2 sets, then Lt leg 37# 2X10      Knee/Hip Exercises: Standing   Forward Step Up Both;1 set;10 reps;Hand Hold: 1;Step Height: 6"    Step Down Both;1 set;10 reps;Hand Hold: 1;Step Height: 6"    Step Down Limitations up with left leg and down with Rt leg      Knee/Hip Exercises: Seated   Long Arc Quad Left;20 reps    Long Arc Quad Limitations 3 lbs    Sit to [employer] 10 reps;without UE support      Vasopneumatic   Number Minutes Vasopneumatic  7 minutes    Vasopnuematic Location  Knee    Vasopneumatic Pressure Medium    Vasopneumatic Temperature  34      Manual Therapy   Manual therapy comments Lt knee PROM in supine, flexion and extension mobs, passive hamstring stretching                       PT Short Term Goals - 08/09/21 1317       PT SHORT TERM GOAL #1   Title Patient is independent with initial HEP.    Time 4    Period Weeks    Status On-going    Target Date 08/26/21      PT SHORT TERM GOAL #2   Title Left knee PROM 0* ext to 90* flex    Time 4    Period Weeks    Status On-going    Target Date 08/26/21      PT SHORT TERM GOAL #3   Title Do initial FOTO & set STG and LTG    Baseline met 12/6    Status Achieved               PT Long Term Goals - 08/09/21 1317       PT LONG TERM GOAL #1   Title She will improve FOTO to at least 67% functonal intake    Time 8    Period Weeks    Status New    Target Date 09/23/21      PT LONG TERM GOAL #2  Title left knee pain </= 2/10 with standing & gait activities    Time 8    Period Weeks    Status On-going    Target Date 09/23/21      PT LONG TERM GOAL #3   Title Left Knee AROM 0* - 100* antigravity    Time 8    Period Weeks    Status On-going    Target Date 09/23/21      PT LONG TERM GOAL #4   Title Patient ambulates 500', negotiates  ramps, curbs & stairs single rail modified independent without device.    Time 8    Period Weeks    Status On-going    Target Date 09/23/21                   Plan - 08/17/21 1327     Clinical Impression Statement Her Lt knee ROM and strength continue to progress as expected with PT and have improved significantly with updated measurments today. We will continue to work to progress her to her tolerance. Continue POC.    Personal Factors and Comorbidities Comorbidity 3+    Comorbidities arthritis, DDD, lumbar radiofrequency L3-S1 2022, DM2, glaucoma, HTN, lt plantarfacilitis, lt THA, Rt TKA 04 revision 2015, rt TSA 02/16/2021    Examination-Activity Limitations Locomotion Level;Squat;Stairs;Stand;Transfers    Examination-Participation Restrictions Community Activity;Occupation    Stability/Clinical Decision Making Stable/Uncomplicated    Rehab Potential Good    PT Frequency 3x / week   3x/wk for 2 weeks, then 2x/wk for 6 weeks   PT Duration 8 weeks    PT Treatment/Interventions ADLs/Self Care Home Management;Electrical Stimulation;DME Instruction;Gait training;Stair training;Functional mobility training;Therapeutic activities;Therapeutic exercise;Balance training;Neuromuscular re-education;Patient/family education;Manual techniques;Passive range of motion;Vasopneumatic Device;Joint Manipulations;Taping    PT Next Visit Plan gait without AD, balance, Lt knee ROM and strength including standing functional exercises    PT Home Exercise Plan Access Code: [email or web address]    Consulted and Agree with Plan of Care Patient             Patient will benefit from skilled therapeutic intervention in order to improve the following deficits and impairments:  Abnormal gait, Decreased activity tolerance, Decreased balance, Decreased endurance, Decreased mobility, Decreased range of motion, Decreased knowledge of use of DME, Decreased scar mobility, Decreased strength, Difficulty walking, Increased edema,  Impaired flexibility, Postural dysfunction, Pain  Visit Diagnosis: Acute pain of left knee  Stiffness of left knee, not elsewhere classified  Muscle weakness (generalized)  Localized edema  Other abnormalities of gait and mobility     Problem List Patient Active Problem List   Diagnosis Date Noted   Primary osteoarthritis of left knee 07/11/2021   Status post total left knee replacement 07/11/2021   DJD (degenerative joint disease) of knee 07/11/2021   Great toe pain, left 06/08/2021   Sleep apnea 05/24/2021   Low grade fever 01/17/2020   Frequent urination 10/14/2019   OAB (overactive bladder) 10/14/2019   Trigger middle finger of right hand 11/12/2018   Spondylosis of cervical region without myelopathy or radiculopathy 01/14/2018   Chronic left shoulder pain 01/08/2018   History of bilateral hip replacements 07/19/2017   Rotator cuff tear 02/02/2017   Acquired renal cyst of left kidney 01/01/2016   Osteoarthritis of spine with radiculopathy, 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   DDD (degenerative disc disease), lumbar 03/04/2014   Lumbar facet joint pain 02/24/2014   IBS (irritable bowel syndrome) 05/13/2013   Obesity 11/13/2012   Glaucoma    Type 2 diabetes mellitus, controlled (  [place])    Anxiety and depression    Vitreous degeneration, unspecified eye 10/25/2012   HTN (hypertension) 01/06/2012   GERD (gastroesophageal reflux disease) 01/06/2012   Hypercholesteremia 01/06/2012    Debbe Odea, PT,DPT 08/17/2021, 1:50 PM  [facility] Physical Therapy [address] [place], [place], [address] Phone: [phone]   Fax:  [phone]  Name: Melissa [name] MRN: [id number] Date of Birth: March 12, 1951

## 2021-08-19 ENCOUNTER — Telehealth: Payer: Self-pay | Admitting: *Deleted

## 2021-08-19 ENCOUNTER — Ambulatory Visit: Payer: Medicare Other

## 2021-08-19 ENCOUNTER — Encounter: Payer: Self-pay | Admitting: *Deleted

## 2021-08-19 NOTE — Telephone Encounter (Signed)

## 2021-08-22 ENCOUNTER — Ambulatory Visit (INDEPENDENT_AMBULATORY_CARE_PROVIDER_SITE_OTHER): Payer: Medicare Other | Admitting: Physical Therapy

## 2021-08-22 ENCOUNTER — Other Ambulatory Visit: Payer: Self-pay

## 2021-08-22 DIAGNOSIS — R600 Localized edema: Secondary | ICD-10-CM | POA: Diagnosis not present

## 2021-08-22 DIAGNOSIS — M6281 Muscle weakness (generalized): Secondary | ICD-10-CM

## 2021-08-22 DIAGNOSIS — M25662 Stiffness of left knee, not elsewhere classified: Secondary | ICD-10-CM | POA: Diagnosis not present

## 2021-08-22 DIAGNOSIS — M25562 Pain in left knee: Secondary | ICD-10-CM

## 2021-08-22 DIAGNOSIS — R2689 Other abnormalities of gait and mobility: Secondary | ICD-10-CM

## 2021-08-22 NOTE — Therapy (Signed)
[facility] Physical Therapy [address] [place], [place], [address] Phone: [phone]   Fax:  [phone]  Physical Therapy Treatment  Patient Details  Name: Melissa [name] MRN: [id number] Date of Birth: April 21, 1951 Referring Provider (PT): Frankey Shown, MD   Encounter Date: 08/22/2021   PT End of Session - 08/22/21 1339     Visit Number 6    Number of Visits 18    Date for PT Re-Evaluation 09/23/21    Authorization Type UHC Medicare    Progress Note Due on Visit 10    PT Start Time 0100    PT Stop Time 0145    PT Time Calculation (min) 45 min    Activity Tolerance Patient tolerated treatment well    Behavior During Therapy [facility] for tasks assessed/performed             Past Medical History:  Diagnosis Date   Allergy    Anxiety    Arthritis    s/p R TKR   Cyst of left kidney 2015   by lumbar MRI pending renal US   DDD (degenerative disc disease), lumbar 03/2014   severe L2-3 with L HNP with L2/3 nerve root impingement [name])   Depression with anxiety    Diabetes type 2, controlled ([place]) 2011   borderline   Glaucoma    History of ulcer disease    HLD (hyperlipidemia)    no meds taken   Hypertension    Plantar fasciitis of left foot 10/01/2013   PONV (postoperative nausea and vomiting)    Right-sided face pain 01/07/2019   Sleep apnea 2022    Past Surgical History:  Procedure Laterality Date   ABDOMINAL HYSTERECTOMY  2007   fibroids, heavy bleeding   ARTERY BIOPSY Right 01/23/2019   Procedure: REMOVAL OF PART OF RIGHT TEMPORAL ARTERY FOR BIOPSY;  Surgeon: Michael Boston, MD;  Location: [place];  Service: General;  Laterality: Right;   CATARACT EXTRACTION W/ INTRAOCULAR LENS IMPLANT Bilateral    COLONOSCOPY  04/2017   TA, diverticulosis, rpt ? ([facility])   GLAUCOMA SURGERY     HAMMER TOE SURGERY Bilateral    Right hip replacement     TONSILLECTOMY     TOTAL HIP ARTHROPLASTY Left 2021   TOTAL KNEE ARTHROPLASTY Right 2004   TKR  (Dr. Eddie Dibbles with [facility])   TOTAL KNEE ARTHROPLASTY Left 07/11/2021   Procedure: LEFT TOTAL KNEE ARTHROPLASTY;  Surgeon: Leandrew Koyanagi, MD;  Location: [place];  Service: Orthopedics;  Laterality: Left;   TOTAL KNEE REVISION Right 03/2014   Dr Al [place] Right 2022    There were no vitals filed for this visit.   Subjective Assessment - 08/22/21 1306     Subjective her knee feels pretty good today    Pertinent History arthritis, DDD, lumbar radiofrequency L3-S1 2022, DM2, glaucoma, HTN, lt plantarfacilitis, lt THA, Rt TKA 04 revision 2015, rt TSA 02/16/2021    Patient Stated Goals get back to working her part-time job by 12/03/2021, walk in community    Pain Onset More than a month ago                               [facility] Adult PT Treatment/Exercise - 08/22/21 0001       Knee/Hip Exercises: Stretches   Knee: Self-Stretch Limitations Seated tailgate stretch 10 sec X10    Gastroc Stretch Both;2 reps;30 seconds  Gastroc Stretch Limitations slant board      Knee/Hip Exercises: Aerobic   Recumbent Bike 8 min L2 seat #7      Knee/Hip Exercises: Machines for Strengthening   Total Gym Leg Press BLEs 100# 15 reps 2 sets, then Lt leg 37# 2X15      Knee/Hip Exercises: Standing   Forward Step Up Both;1 set;10 reps;Hand Hold: 1;Step Height: 6"    Forward Step Up Limitations see below    Step Down Both;1 set;10 reps;Hand Hold: 1;Step Height: 6"    Step Down Limitations up with left leg and down with Rt leg    Other Standing Knee Exercises tandem walk at counter 1 UE support X2 round trips      Knee/Hip Exercises: Seated   Long Arc Quad Left;2 sets;10 reps    Long Arc Quad Limitations 4 lbs    Sit to [employer] 10 reps;without UE support      Manual Therapy   Manual therapy comments Lt knee PROM in supine, flexion and extension mobs, passive hamstring stretching                       PT Short Term Goals - 08/09/21 1317       PT  SHORT TERM GOAL #1   Title Patient is independent with initial HEP.    Time 4    Period Weeks    Status On-going    Target Date 08/26/21      PT SHORT TERM GOAL #2   Title Left knee PROM 0* ext to 90* flex    Time 4    Period Weeks    Status On-going    Target Date 08/26/21      PT SHORT TERM GOAL #3   Title Do initial FOTO & set STG and LTG    Baseline met 12/6    Status Achieved               PT Long Term Goals - 08/09/21 1317       PT LONG TERM GOAL #1   Title She will improve FOTO to at least 67% functonal intake    Time 8    Period Weeks    Status New    Target Date 09/23/21      PT LONG TERM GOAL #2   Title left knee pain </= 2/10 with standing & gait activities    Time 8    Period Weeks    Status On-going    Target Date 09/23/21      PT LONG TERM GOAL #3   Title Left Knee AROM 0* - 100* antigravity    Time 8    Period Weeks    Status On-going    Target Date 09/23/21      PT LONG TERM GOAL #4   Title Patient ambulates 500', negotiates ramps, curbs & stairs single rail modified independent without device.    Time 8    Period Weeks    Status On-going    Target Date 09/23/21                   Plan - 08/22/21 1339     Clinical Impression Statement We progressed her strength program some with good overall tolerance. I added tandem walk today for balance challenge as she still has some gait unsteadiness but this is now only mild. Continue POC    Personal Factors and Comorbidities Comorbidity 3+    Comorbidities  arthritis, DDD, lumbar radiofrequency L3-S1 2022, DM2, glaucoma, HTN, lt plantarfacilitis, lt THA, Rt TKA 04 revision 2015, rt TSA 02/16/2021    Examination-Activity Limitations Locomotion Level;Squat;Stairs;Stand;Transfers    Examination-Participation Restrictions Community Activity;Occupation    Stability/Clinical Decision Making Stable/Uncomplicated    Rehab Potential Good    PT Frequency 3x / week   3x/wk for 2 weeks, then 2x/wk  for 6 weeks   PT Duration 8 weeks    PT Treatment/Interventions ADLs/Self Care Home Management;Electrical Stimulation;DME Instruction;Gait training;Stair training;Functional mobility training;Therapeutic activities;Therapeutic exercise;Balance training;Neuromuscular re-education;Patient/family education;Manual techniques;Passive range of motion;Vasopneumatic Device;Joint Manipulations;Taping    PT Next Visit Plan gait without AD, balance, Lt knee ROM and strength including standing functional exercises, vaso if desired    PT Home Exercise Plan Access Code: [email or web address]    Consulted and Agree with Plan of Care Patient             Patient will benefit from skilled therapeutic intervention in order to improve the following deficits and impairments:  Abnormal gait, Decreased activity tolerance, Decreased balance, Decreased endurance, Decreased mobility, Decreased range of motion, Decreased knowledge of use of DME, Decreased scar mobility, Decreased strength, Difficulty walking, Increased edema, Impaired flexibility, Postural dysfunction, Pain  Visit Diagnosis: Acute pain of left knee  Stiffness of left knee, not elsewhere classified  Muscle weakness (generalized)  Localized edema  Other abnormalities of gait and mobility     Problem List Patient Active Problem List   Diagnosis Date Noted   Primary osteoarthritis of left knee 07/11/2021   Status post total left knee replacement 07/11/2021   DJD (degenerative joint disease) of knee 07/11/2021   Great toe pain, left 06/08/2021   Sleep apnea 05/24/2021   Low grade fever 01/17/2020   Frequent urination 10/14/2019   OAB (overactive bladder) 10/14/2019   Trigger middle finger of right hand 11/12/2018   Spondylosis of cervical region without myelopathy or radiculopathy 01/14/2018   Chronic left shoulder pain 01/08/2018   History of bilateral hip replacements 07/19/2017   Rotator cuff tear 02/02/2017   Acquired renal cyst of left kidney  01/01/2016   Osteoarthritis of spine with radiculopathy, 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   DDD (degenerative disc disease), lumbar 03/04/2014   Lumbar facet joint pain 02/24/2014   IBS (irritable bowel syndrome) 05/13/2013   Obesity 11/13/2012   Glaucoma    Type 2 diabetes mellitus, controlled ([place])    Anxiety and depression    Vitreous degeneration, unspecified eye 10/25/2012   HTN (hypertension) 01/06/2012   GERD (gastroesophageal reflux disease) 01/06/2012   Hypercholesteremia 01/06/2012    Debbe Odea, PT,DPT 08/22/2021, 1:42 PM  [facility] Physical Therapy [address] [place], [place], [address] Phone: [phone]   Fax:  [phone]  Name: Melissa [name] MRN: [id number] Date of Birth: [birth year]

## 2021-08-23 ENCOUNTER — Telehealth: Payer: Self-pay | Admitting: *Deleted

## 2021-08-23 DIAGNOSIS — R7989 Other specified abnormal findings of blood chemistry: Secondary | ICD-10-CM | POA: Insufficient documentation

## 2021-08-23 DIAGNOSIS — R778 Other specified abnormalities of plasma proteins: Secondary | ICD-10-CM | POA: Insufficient documentation

## 2021-08-23 NOTE — Progress Notes (Signed)
Cardiology Office Note   Date:  08/24/2021   ID:  Melissa [name], Melissa [name] 03/10/1951, MRN [id number]  PCP:  Angelica Pou, MD  Cardiologist:   Minus Breeding, MD   Chief Complaint  Patient presents with   Elevated Troponin      History of Present Illness: Melissa [name] is a 70 y.o. female who presents for evaluation of an elevated troponin.  This was noted at the time of a knee arthroplasty.   Echo last month demonstrated an EF of 70 - 75%.     She presents for follow up and to consider an ischemia evaluation.  She has been participating physical therapy. The patient denies any new symptoms such as chest discomfort, neck or arm discomfort. There has been no new shortness of breath, PND or orthopnea. There have been no reported palpitations, presyncope or syncope.    Past Medical History:  Diagnosis Date   Allergy    Anxiety    Arthritis    s/p R TKR   Cyst of left kidney 2015   by lumbar MRI pending renal US   DDD (degenerative disc disease), lumbar 03/2014   severe L2-3 with L HNP with L2/3 nerve root impingement [name])   Depression with anxiety    Diabetes type 2, controlled ([place]) 2011   borderline   Glaucoma    History of ulcer disease    HLD (hyperlipidemia)    no meds taken   Hypertension    Plantar fasciitis of left foot 10/01/2013   PONV (postoperative nausea and vomiting)    Right-sided face pain 01/07/2019   Sleep apnea 2022    Past Surgical History:  Procedure Laterality Date   ABDOMINAL HYSTERECTOMY  2007   fibroids, heavy bleeding   ARTERY BIOPSY Right 01/23/2019   Procedure: REMOVAL OF PART OF RIGHT TEMPORAL ARTERY FOR BIOPSY;  Surgeon: Michael Boston, MD;  Location: [place];  Service: General;  Laterality: Right;   CATARACT EXTRACTION W/ INTRAOCULAR LENS IMPLANT Bilateral    COLONOSCOPY  04/2017   TA, diverticulosis, rpt ? ([facility])   GLAUCOMA SURGERY     HAMMER TOE SURGERY Bilateral    Right hip replacement     TONSILLECTOMY      TOTAL HIP ARTHROPLASTY Left 2021   TOTAL KNEE ARTHROPLASTY Right 2004   TKR (Dr. Eddie Dibbles with [facility])   TOTAL KNEE ARTHROPLASTY Left 07/11/2021   Procedure: LEFT TOTAL KNEE ARTHROPLASTY;  Surgeon: Leandrew Koyanagi, MD;  Location: [place];  Service: Orthopedics;  Laterality: Left;   TOTAL KNEE REVISION Right 03/2014   Dr Al Corpus [facility]   TOTAL SHOULDER ARTHROPLASTY Right 2022     Current Outpatient Medications  Medication Sig Dispense Refill   Accu-Chek FastClix Lancets MISC USE TO TEST BLOOD SUGAR UP TO FOUR TIMES DAILY 306 each 1   ACCU-CHEK GUIDE test strip USE AS DIRECTED FOUR TIMES DAILY 100 strip 11   ALPRAZolam (XANAX) 0.25 MG tablet TAKE 1 TABLET(0.25 MG) BY MOUTH AT BEDTIME AS NEEDED FOR SLEEP 30 tablet 5   aspirin EC 81 MG tablet Take 1 tablet (81 mg total) by mouth 2 (two) times daily. To be taken after surgery 84 tablet 0   blood glucose meter kit and supplies KIT Dispense based on patient and insurance preference. Use up to four times daily as directed. DX Code: E11.9 1 each 0   Crisaborole (EUCRISA) 2 % OINT Apply 1 application topically daily as needed (Itchy skin). 60 g  5   cycloSPORINE (RESTASIS) 0.05 % ophthalmic emulsion Place 1 drop into both eyes 2 (two) times daily as needed (dry eyes).     desonide (DESOWEN) 0.05 % ointment Apply 1 application topically 2 (two) times daily as needed. 60 g 5   glipiZIDE (GLUCOTROL XL) 2.5 MG 24 hr tablet Take 2.5 mg by mouth daily with breakfast.     hydrochlorothiazide (HYDRODIURIL) 25 MG tablet Take 1 tablet (25 mg total) by mouth daily. (Patient taking differently: Take 25 mg by mouth daily as needed (swelling).) 30 tablet 0   hydroquinone 4 % cream Apply to darker areas of skin once a day as needed 28.35 g 3   levocetirizine (XYZAL) 5 MG tablet Take 1 tablet (5 mg total) by mouth every evening. 30 tablet 5   lisinopril (ZESTRIL) 20 MG tablet Take 1 tablet (20 mg total) by mouth daily. 90 tablet 3   MULTIPLE VITAMIN PO Take 1 tablet by mouth  daily.     omeprazole (PRILOSEC) 20 MG capsule Take 20 mg by mouth daily as needed (heartburn).     TRULICITY 1.5 VQ/0.0QQ SOPN Inject 1.5 mg into the skin every Friday.     VYZULTA 0.024 % SOLN Place 1 drop into both eyes at bedtime.     No current facility-administered medications for this visit.    Allergies:   Codeine, Metformin and related, Other, and Oxycodone     ROS:  Please see the history of present illness.   Otherwise, review of systems are positive for none.   All other systems are reviewed and negative.    PHYSICAL EXAM: VS:  BP 110/70 (BP Location: Left Arm, Patient Position: Sitting, Cuff Size: Normal)    Pulse 88    Resp 20    Ht _0  (1.651 m)    Wt 197 lb 3.2 oz (89.4 kg)    BMI 32.82 kg/m  , BMI Body mass index is 32.82 kg/m. GENERAL:  Well appearing HEENT:  Pupils equal round and reactive, fundi not visualized, oral mucosa unremarkable NECK:  No jugular venous distention, waveform within normal limits, carotid upstroke brisk and symmetric, no bruits, no thyromegaly LYMPHATICS:  No cervical, inguinal adenopathy LUNGS:  Clear to auscultation bilaterally BACK:  No CVA tenderness CHEST:  Unremarkable HEART:  PMI not displaced or sustained,S1 and S2 within normal limits, no S3, no S4, no clicks, no rubs, no murmurs ABD:  Flat, positive bowel sounds normal in frequency in pitch, no bruits, no rebound, no guarding, no midline pulsatile mass, no hepatomegaly, no splenomegaly EXT:  2 plus pulses throughout, no edema, no cyanosis no clubbing SKIN:  No rashes no nodules NEURO:  Cranial nerves II through XII grossly intact, motor grossly intact throughout PSYCH:  Cognitively intact, oriented to person place and time    EKG:  EKG is not ordered today.    Recent Labs: 07/11/2021: ALT 15; TSH 3.469 07/12/2021: BUN 15; Creatinine, Ser 0.98; Hemoglobin 10.7; Platelets 295; Potassium 3.6; Sodium 138    Lipid Panel    Component Value Date/Time   CHOL 242 (H) 01/09/2019  1249   TRIG 151.0 (H) 01/09/2019 1249   HDL 61.80 01/09/2019 1249   CHOLHDL 4 01/09/2019 1249   VLDL 30.2 01/09/2019 1249   LDLCALC 150 (H) 01/09/2019 1249   LDLDIRECT 183.0 09/18/2017 1325      Wt Readings from Last 3 Encounters:  08/24/21 197 lb 3.2 oz (89.4 kg)  07/11/21 199 lb (90.3 kg)  07/07/21 199 lb 4.8  oz (90.4 kg)      Other studies Reviewed: Additional studies/ records that were reviewed today include: None. Review of the above records demonstrates:  Please see elsewhere in the note.     ASSESSMENT AND PLAN:  ELEVATED TROPONIN: Because it was about 200 I would like to further evaluate this probably just with a screening POET (Plain Old Exercise Treadmill).  We will do this in the spring when she thinks she will be able to walk without any pain in her knee.  HTN:   Her blood pressure is controlled.  No change in therapy.  SLEEP APNEA: She is awaiting CPAP to be delivered.   Current medicines are reviewed at length with the patient today.  The patient does not have concerns regarding medicines.  The following changes have been made:  no change  Labs/ tests ordered today include:   Orders Placed This Encounter  Procedures   EXERCISE TOLERANCE TEST (ETT)     Disposition:   FU with me as needed and based off results of the above.   Signed, Minus Breeding, MD  08/24/2021 12:14 PM    [facility]

## 2021-08-23 NOTE — Telephone Encounter (Signed)
Ortho bundle 30 day call attempted. No answer and left VM requesting call back. 

## 2021-08-24 ENCOUNTER — Encounter: Payer: Self-pay | Admitting: Cardiology

## 2021-08-24 ENCOUNTER — Ambulatory Visit (INDEPENDENT_AMBULATORY_CARE_PROVIDER_SITE_OTHER): Payer: Medicare Other | Admitting: Cardiology

## 2021-08-24 ENCOUNTER — Other Ambulatory Visit: Payer: Self-pay

## 2021-08-24 VITALS — BP 110/70 | HR 88 | Resp 20 | Ht 65.0 in | Wt 197.2 lb

## 2021-08-24 DIAGNOSIS — I10 Essential (primary) hypertension: Secondary | ICD-10-CM | POA: Diagnosis not present

## 2021-08-24 DIAGNOSIS — R778 Other specified abnormalities of plasma proteins: Secondary | ICD-10-CM | POA: Diagnosis not present

## 2021-08-24 NOTE — Patient Instructions (Addendum)
Medication Instructions:  No Changes In Medications at this time.  *If you need a refill on your cardiac medications before your next appointment, please call your pharmacy*  Testing/Procedures: Your physician has requested that you have an exercise tolerance test. April 2023 For further information please visit [email or web address]. Please also follow instruction sheet, as given. This will take place at [place], [address]. Do not drink or eat foods with caffeine for 24 hours before the test. (Chocolate, coffee, tea, or energy drinks) If you use an inhaler, bring it with you to the test. Do not smoke for 4 hours before the test. Wear comfortable shoes and clothing.  Follow-Up: At [facility], you and your health needs are our priority.  As part of our continuing mission to provide you with exceptional heart care, we have created designated Provider Care Teams.  These Care Teams include your primary Cardiologist (physician) and Advanced Practice Providers (APPs -  Physician Assistants and Nurse Practitioners) who all work together to provide you with the care you need, when you need it.  Your next appointment:   AS NEEDED   The format for your next appointment:   In Person  Provider:   Minus Breeding, MD

## 2021-08-24 NOTE — Addendum Note (Signed)
Addended by: Rexanne Mano B on: 08/24/2021 01:18 PM   Modules accepted: Orders

## 2021-08-25 ENCOUNTER — Encounter: Payer: Medicare Other | Admitting: Physical Therapy

## 2021-08-25 ENCOUNTER — Telehealth: Payer: Self-pay | Admitting: *Deleted

## 2021-08-25 NOTE — Telephone Encounter (Signed)
Ortho bundle 30 day call completed. °

## 2021-08-30 ENCOUNTER — Ambulatory Visit (INDEPENDENT_AMBULATORY_CARE_PROVIDER_SITE_OTHER): Payer: Medicare Other | Admitting: Physical Therapy

## 2021-08-30 ENCOUNTER — Other Ambulatory Visit: Payer: Self-pay

## 2021-08-30 DIAGNOSIS — M25662 Stiffness of left knee, not elsewhere classified: Secondary | ICD-10-CM

## 2021-08-30 DIAGNOSIS — M6281 Muscle weakness (generalized): Secondary | ICD-10-CM

## 2021-08-30 DIAGNOSIS — R600 Localized edema: Secondary | ICD-10-CM

## 2021-08-30 DIAGNOSIS — M25562 Pain in left knee: Secondary | ICD-10-CM

## 2021-08-30 NOTE — Patient Instructions (Signed)
Access Code: [email or web address] URL: [email or web address] Date: 08/30/2021 Prepared by: Elsie Ra  Exercises Standing Shoulder Flexion Wall Walk - 2 x daily - 6 x weekly - 1-2 sets - 10 reps - 5 sec hold Standing Shoulder Abduction Finger Walk at Wall - 2 x daily - 6 x weekly - 1-2 sets - 10 reps Shoulder External Rotation with Anchored Resistance - 2 x daily - 6 x weekly - 2-3 sets - 10 reps Standing Bilateral Low Shoulder Row with Anchored Resistance - 2 x daily - 6 x weekly - 2-3 sets - 10 reps

## 2021-08-30 NOTE — Therapy (Signed)
[facility] Physical Therapy [address] [place], [place], [address] Phone: [phone]   Fax:  [phone]  Physical Therapy Treatment  Patient Details  Name: Melissa [name] MRN: [id number] Date of Birth: [birth year] Referring Provider (PT): Frankey Shown, MD   Encounter Date: 08/30/2021   PT End of Session - 08/30/21 1341     Visit Number 7    Number of Visits 18    Date for PT Re-Evaluation 09/23/21    Authorization Type [employer] Medicare    Progress Note Due on Visit 10    PT Start Time 1300    PT Stop Time 1344    PT Time Calculation (min) 44 min    Activity Tolerance Patient tolerated treatment well    Behavior During Therapy [facility] for tasks assessed/performed             Past Medical History:  Diagnosis Date   Allergy    Anxiety    Arthritis    s/p R TKR   Cyst of left kidney 2015   by lumbar MRI pending renal US   DDD (degenerative disc disease), lumbar 03/2014   severe L2-3 with L HNP with L2/3 nerve root impingement [name])   Depression with anxiety    Diabetes type 2, controlled ([place]) 2011   borderline   Glaucoma    History of ulcer disease    HLD (hyperlipidemia)    no meds taken   Hypertension    Plantar fasciitis of left foot 10/01/2013   PONV (postoperative nausea and vomiting)    Right-sided face pain 01/07/2019   Sleep apnea 2022    Past Surgical History:  Procedure Laterality Date   ABDOMINAL HYSTERECTOMY  2007   fibroids, heavy bleeding   ARTERY BIOPSY Right 01/23/2019   Procedure: REMOVAL OF PART OF RIGHT TEMPORAL ARTERY FOR BIOPSY;  Surgeon: Michael Boston, MD;  Location: [place];  Service: General;  Laterality: Right;   CATARACT EXTRACTION W/ INTRAOCULAR LENS IMPLANT Bilateral    COLONOSCOPY  04/2017   TA, diverticulosis, rpt ? ([facility])   GLAUCOMA SURGERY     HAMMER TOE SURGERY Bilateral    Right hip replacement     TONSILLECTOMY     TOTAL HIP ARTHROPLASTY Left 2021   TOTAL KNEE ARTHROPLASTY Right 2004   TKR  (Dr. Eddie Dibbles with [facility])   TOTAL KNEE ARTHROPLASTY Left 07/11/2021   Procedure: LEFT TOTAL KNEE ARTHROPLASTY;  Surgeon: Leandrew Koyanagi, MD;  Location: [place];  Service: Orthopedics;  Laterality: Left;   TOTAL KNEE REVISION Right 03/2014   Dr Al Corpus [place] Right 2022    There were no vitals filed for this visit.   Subjective Assessment - 08/30/21 1310     Subjective she denies significant knee pain today. She does state she has some shoulder pain and weakness still after Rt TSA. I informed her we would need PT referral for this but I did at least provide her with HEP and resistance band she can use to improve her shoulder at home for now.    Pertinent History arthritis, DDD, lumbar radiofrequency L3-S1 2022, DM2, glaucoma, HTN, lt plantarfacilitis, lt THA, Rt TKA 04 revision 2015, rt TSA 02/16/2021    Patient Stated Goals get back to working her part-time job by 12/03/2021, walk in community    Pain Onset More than a month ago  [place] [facility] PT Treatment/Exercise - 08/30/21 0001       Knee/Hip Exercises: Stretches   Knee: Self-Stretch Limitations Seated tailgate stretch 10 sec X10    Gastroc Stretch Both;2 reps;30 seconds    Gastroc Stretch Limitations slant board      Knee/Hip Exercises: Aerobic   Recumbent Bike 8 min L2 seat #7      Knee/Hip Exercises: Machines for Strengthening   Total Gym Leg Press --   held off on this today, she feels it caused hip pain last time     Knee/Hip Exercises: Standing   Forward Step Up Both;1 set;10 reps;Hand Hold: 1;Step Height: 6"    Forward Step Up Limitations see below    Step Down Both;1 set;10 reps;Hand Hold: 1;Step Height: 6"    Step Down Limitations up with left leg and down with Rt leg    Other Standing Knee Exercises tandem walk at counter  X3 round trips    Other Standing Knee Exercises hip abduction 4# 2X10 on Lt      Knee/Hip Exercises: Seated   Long Arc Quad  Left;2 sets;15 reps    Long Arc Quad Limitations 4 lbs      Modalities   Modalities Vasopneumatic      Vasopneumatic   Number Minutes Vasopneumatic  5 minutes    Vasopnuematic Location  Knee    Vasopneumatic Pressure Medium    Vasopneumatic Temperature  34      Manual Therapy   Manual therapy comments Lt knee PROM in supine, flexion mobs, passive hamstring stretching                       PT Short Term Goals - 08/09/21 1317       PT SHORT TERM GOAL #1   Title Patient is independent with initial HEP.    Time 4    Period Weeks    Status On-going    Target Date 08/26/21      PT SHORT TERM GOAL #2   Title Left knee PROM 0* ext to 90* flex    Time 4    Period Weeks    Status On-going    Target Date 08/26/21      PT SHORT TERM GOAL #3   Title Do initial FOTO & set STG and LTG    Baseline met 12/6    Status Achieved               PT Long Term Goals - 08/09/21 1317       PT LONG TERM GOAL #1   Title She will improve FOTO to at least 67% functonal intake    Time 8    Period Weeks    Status New    Target Date 09/23/21      PT LONG TERM GOAL #2   Title left knee pain </= 2/10 with standing & gait activities    Time 8    Period Weeks    Status On-going    Target Date 09/23/21      PT LONG TERM GOAL #3   Title Left Knee AROM 0* - 100* antigravity    Time 8    Period Weeks    Status On-going    Target Date 09/23/21      PT LONG TERM GOAL #4   Title Patient ambulates 500', negotiates ramps, curbs & stairs single rail modified independent without device.    Time 8    Period Weeks  Status On-going    Target Date 09/23/21                   Plan - 08/30/21 1330     Clinical Impression Statement She is doing good with PT and is progressing as expected, her ROM felt a lot better this week with PROM and we will update her measurments next visit.    Personal Factors and Comorbidities Comorbidity 3+    Comorbidities arthritis, DDD,  lumbar radiofrequency L3-S1 2022, DM2, glaucoma, HTN, lt plantarfacilitis, lt THA, Rt TKA 04 revision 2015, rt TSA 02/16/2021    Examination-Activity Limitations Locomotion Level;Squat;Stairs;Stand;Transfers    Examination-Participation Restrictions Community Activity;Occupation    Stability/Clinical Decision Making Stable/Uncomplicated    Rehab Potential Good    PT Frequency 3x / week   3x/wk for 2 weeks, then 2x/wk for 6 weeks   PT Duration 8 weeks    PT Treatment/Interventions ADLs/Self Care Home Management;Electrical Stimulation;DME Instruction;Gait training;Stair training;Functional mobility training;Therapeutic activities;Therapeutic exercise;Balance training;Neuromuscular re-education;Patient/family education;Manual techniques;Passive range of motion;Vasopneumatic Device;Joint Manipulations;Taping    PT Next Visit Plan update measurments,  Lt knee ROM and strength including standing functional exercises, vaso if desired    PT Home Exercise Plan Access Code: [email or web address] (knee) and also provided her with  Access Code: [email or web address] (shoulder)    Consulted and Agree with Plan of Care Patient             Patient will benefit from skilled therapeutic intervention in order to improve the following deficits and impairments:  Abnormal gait, Decreased activity tolerance, Decreased balance, Decreased endurance, Decreased mobility, Decreased range of motion, Decreased knowledge of use of DME, Decreased scar mobility, Decreased strength, Difficulty walking, Increased edema, Impaired flexibility, Postural dysfunction, Pain  Visit Diagnosis: Acute pain of left knee  Stiffness of left knee, not elsewhere classified  Muscle weakness (generalized)  Localized edema     Problem List Patient Active Problem List   Diagnosis Date Noted   Elevated troponin 08/23/2021   Primary osteoarthritis of left knee 07/11/2021   Status post total left knee replacement 07/11/2021   DJD (degenerative joint disease)  of knee 07/11/2021   Great toe pain, left 06/08/2021   Sleep apnea 05/24/2021   Low grade fever 01/17/2020   Frequent urination 10/14/2019   OAB (overactive bladder) 10/14/2019   Trigger middle finger of right hand 11/12/2018   Spondylosis of cervical region without myelopathy or radiculopathy 01/14/2018   Chronic left shoulder pain 01/08/2018   History of bilateral hip replacements 07/19/2017   Rotator cuff tear 02/02/2017   Acquired renal cyst of left kidney 01/01/2016   Osteoarthritis of spine with radiculopathy, 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   DDD (degenerative disc disease), lumbar 03/04/2014   Lumbar facet joint pain 02/24/2014   IBS (irritable bowel syndrome) 05/13/2013   Obesity 11/13/2012   Glaucoma    Type 2 diabetes mellitus, controlled ([place])    Anxiety and depression    Vitreous degeneration, unspecified eye 10/25/2012   HTN (hypertension) 01/06/2012   GERD (gastroesophageal reflux disease) 01/06/2012   Hypercholesteremia 01/06/2012    Debbe Odea, PT,DPT 08/30/2021, 1:50 PM  [facility] Physical Therapy [address] [place], [place], [address] Phone: [phone]   Fax:  [phone]  Name: Melissa [name] MRN: [id number] Date of Birth: [birth year]

## 2021-08-31 ENCOUNTER — Other Ambulatory Visit: Payer: Self-pay | Admitting: Physician Assistant

## 2021-08-31 ENCOUNTER — Encounter: Payer: Self-pay | Admitting: Orthopaedic Surgery

## 2021-08-31 ENCOUNTER — Encounter: Payer: Self-pay | Admitting: Physical Therapy

## 2021-08-31 MED ORDER — METHOCARBAMOL 500 MG PO TABS: 500.0000 mg | ORAL_TABLET | Freq: Two times a day (BID) | ORAL | 0 refills | Status: DC | PRN

## 2021-08-31 NOTE — Addendum Note (Signed)
Addended by: Minus Breeding on: 08/31/2021 11:06 AM   Modules accepted: Orders

## 2021-08-31 NOTE — Telephone Encounter (Signed)
Sent in.  Please come in for a visit if it does not improve

## 2021-09-01 ENCOUNTER — Encounter: Payer: Medicare Other | Admitting: Physical Therapy

## 2021-09-07 ENCOUNTER — Ambulatory Visit (HOSPITAL_COMMUNITY)
Admission: RE | Admit: 2021-09-07 | Payer: Medicare Other | Source: Ambulatory Visit | Attending: Cardiology | Admitting: Cardiology

## 2021-09-12 ENCOUNTER — Telehealth: Payer: Self-pay

## 2021-09-14 ENCOUNTER — Encounter: Payer: Medicare Other | Admitting: Physical Therapy

## 2021-09-19 ENCOUNTER — Encounter: Payer: Self-pay | Admitting: Physical Therapy

## 2021-09-19 ENCOUNTER — Ambulatory Visit (INDEPENDENT_AMBULATORY_CARE_PROVIDER_SITE_OTHER): Payer: Self-pay | Admitting: Physical Therapy

## 2021-09-19 ENCOUNTER — Other Ambulatory Visit: Payer: Self-pay

## 2021-09-19 DIAGNOSIS — R2681 Unsteadiness on feet: Secondary | ICD-10-CM

## 2021-09-19 DIAGNOSIS — R2689 Other abnormalities of gait and mobility: Secondary | ICD-10-CM

## 2021-09-19 DIAGNOSIS — M25562 Pain in left knee: Secondary | ICD-10-CM

## 2021-09-19 DIAGNOSIS — R600 Localized edema: Secondary | ICD-10-CM

## 2021-09-19 DIAGNOSIS — M6281 Muscle weakness (generalized): Secondary | ICD-10-CM

## 2021-09-19 DIAGNOSIS — M25662 Stiffness of left knee, not elsewhere classified: Secondary | ICD-10-CM

## 2021-09-19 NOTE — Therapy (Addendum)
[facility] Physical Therapy [address] [place], [place], [address] Phone: [phone]   Fax:  [phone]  Discharge addendum  PHYSICAL THERAPY DISCHARGE SUMMARY  Visits from Start of Care: 8  Current functional level related to goals / functional outcomes: See below   Remaining deficits: See below   Education / Equipment: HEP Plan: Patient agrees to discharge.  Patient goals were partially met. Patient is being discharged due to holding PT for heart testing then was referred to another clinic.      Physical Therapy Treatment/MD progress note and Recert Progress Note reporting period date 07/26/21 to 09/19/21  See below for objective and subjective measurements relating to patients progress with PT.    Patient Details  Name: Melissa [name] MRN: [id number] Date of Birth: 1950/11/09 Referring Provider (PT): Frankey Shown, MD   Encounter Date: 09/19/2021   PT End of Session - 09/19/21 1529     Visit Number 8    Number of Visits 18    Date for PT Re-Evaluation 10/31/21    Authorization Type [employer] Medicare    Progress Note Due on Visit 10    PT Start Time 1420    PT Stop Time 1510    PT Time Calculation (min) 50 min    Activity Tolerance Patient tolerated treatment well    Behavior During Therapy [facility] for tasks assessed/performed             Past Medical History:  Diagnosis Date   Allergy    Anxiety    Arthritis    s/p R TKR   Cyst of left kidney 2015   by lumbar MRI pending renal US   DDD (degenerative disc disease), lumbar 03/2014   severe L2-3 with L HNP with L2/3 nerve root impingement [name])   Depression with anxiety    Diabetes type 2, controlled ([place]) 2011   borderline   Glaucoma    History of ulcer disease    HLD (hyperlipidemia)    no meds taken   Hypertension    Plantar fasciitis of left foot 10/01/2013   PONV (postoperative nausea and vomiting)    Right-sided face pain 01/07/2019   Sleep apnea 2022    Past  Surgical History:  Procedure Laterality Date   ABDOMINAL HYSTERECTOMY  2007   fibroids, heavy bleeding   ARTERY BIOPSY Right 01/23/2019   Procedure: REMOVAL OF PART OF RIGHT TEMPORAL ARTERY FOR BIOPSY;  Surgeon: Michael Boston, MD;  Location: [place];  Service: General;  Laterality: Right;   CATARACT EXTRACTION W/ INTRAOCULAR LENS IMPLANT Bilateral    COLONOSCOPY  04/2017   TA, diverticulosis, rpt ? ([facility])   GLAUCOMA SURGERY     HAMMER TOE SURGERY Bilateral    Right hip replacement     TONSILLECTOMY     TOTAL HIP ARTHROPLASTY Left 2021   TOTAL KNEE ARTHROPLASTY Right 2004   TKR (Dr. Eddie Dibbles with [facility])   TOTAL KNEE ARTHROPLASTY Left 07/11/2021   Procedure: LEFT TOTAL KNEE ARTHROPLASTY;  Surgeon: Leandrew Koyanagi, MD;  Location: [place];  Service: Orthopedics;  Laterality: Left;   TOTAL KNEE REVISION Right 03/2014   Dr Al [facility] [place] Right 2022    There were no vitals filed for this visit.   Subjective Assessment - 09/19/21 1521     Subjective Pt. reports L knee stiffness, with a 6/10 pain level. Complaints of shoulder pain, she will follow up with MD next week.    Pertinent History  arthritis, DDD, lumbar radiofrequency L3-S1 2022, DM2, glaucoma, HTN, lt plantarfacilitis, lt THA, Rt TKA 04 revision 2015, rt TSA 02/16/2021    Patient Stated Goals get back to working her part-time job by 12/03/2021, walk in community    Pain Onset More than a month ago                [facility] PT Assessment - 09/19/21 0001       Assessment   Medical Diagnosis M17.12 (ICD-10-CM) - Primary osteoarthritis of left knee & Z61.096 (ICD-10-CM) - Status post total left knee replacement    Referring Provider (PT) Frankey Shown, MD    Onset Date/Surgical Date 07/11/21      AROM   Left Knee Extension -3    Left Knee Flexion 108      PROM   Left Knee Extension 0    Left Knee Flexion 111      Strength   Overall Strength Comments Functional weakness noted with stairs. Knee valgus  noted and L Hip 3/5, R Hip 4/5    Left Knee Flexion 4/5    Left Knee Extension 4/5      Ambulation/Gait   Ambulation/Gait Yes    Ambulation/Gait Assistance 5: Supervision    Ambulation Distance (Feet) 500 Feet    Assistive device None                           [facility] Adult PT Treatment/Exercise - 09/19/21 0001       Ambulation/Gait   Stairs Yes    Stairs Assistance 5: Supervision    Stair Management Technique Step to pattern;One rail Right    Number of Stairs 12    Height of Stairs 6    Gait Comments Ramp & 6in Curb w/ supervision      Knee/Hip Exercises: Stretches   Gastroc Stretch Both;3 reps;30 seconds    Gastroc Stretch Limitations slant board      Knee/Hip Exercises: Aerobic   Nustep L5 X 8 min LE      Knee/Hip Exercises: Standing   Other Standing Knee Exercises Hip Abduction with Red theraband around ankles, 2 hand support on countertop 1 x 10 reps per side    Other Standing Knee Exercises Mini Squat at counter top with two hand support, red theraband around knees 1 x 10 reps.                       PT Short Term Goals - 09/19/21 1449       PT SHORT TERM GOAL #1   Title Patient is independent with initial HEP.    Time 4    Period Weeks    Status Achieved    Target Date 08/26/21      PT SHORT TERM GOAL #2   Title Left knee PROM 0* ext to 90* flex    Time 4    Period Weeks    Status Achieved    Target Date 08/26/21      PT SHORT TERM GOAL #3   Title Do initial FOTO & set STG and LTG    Baseline met 12/6    Status Achieved               PT Long Term Goals - 09/19/21 1540       PT LONG TERM GOAL #1   Title She will improve FOTO to at least 67% functonal intake    Baseline  45%    Time 8    Period Weeks    Status New    Target Date 10/31/21      PT LONG TERM GOAL #2   Title left knee pain </= 2/10 with standing & gait activities    Baseline 6/10    Time 8    Period Weeks    Status On-going    Target Date  10/31/21      PT LONG TERM GOAL #3   Title Left Knee AROM 0* - 100* antigravity    Baseline 95*    Time 8    Period Weeks    Status On-going    Target Date 10/31/21      PT LONG TERM GOAL #4   Title Patient ambulates 500', negotiates ramps, curbs & stairs single rail modified independent without device.    Baseline Achieved, but new goal of reciprocal gait with stair navigation    Time 8    Period Weeks    Status Revised    Target Date 10/31/21                   Plan - 09/19/21 1532     Clinical Impression Statement Pt. presents with stiffness in L knee and continued knee flexion and extension limitations. Though, she has progressed in measurements there is still progress to be made. Pt. is ambulatory 587f, over ramps and curbs. However, pt. does not have reciprocal gait with stair ambulation. This is due to weak quadriceps muscles and knee valgus is present due to weak abductor muscles. Pt. HEP has been updated to address these deficits. PT recommends 6 more weeks of physical therapy to continue to improve her functional strength and ROM. Also we will plan to address her shoulder deficits if MD puts in PT referral for this.    Personal Factors and Comorbidities Comorbidity 3+    Comorbidities arthritis, DDD, lumbar radiofrequency L3-S1 2022, DM2, glaucoma, HTN, lt plantarfacilitis, lt THA, Rt TKA 04 revision 2015, rt TSA 02/16/2021    Examination-Activity Limitations Locomotion Level;Squat;Stairs;Stand;Transfers    Examination-Participation Restrictions Community Activity;Occupation    Stability/Clinical Decision Making Stable/Uncomplicated    Rehab Potential Good    PT Frequency 3x / week   3x/wk for 2 weeks, then 2x/wk for 6 weeks   PT Duration 8 weeks    PT Treatment/Interventions ADLs/Self Care Home Management;Electrical Stimulation;DME Instruction;Gait training;Stair training;Functional mobility training;Therapeutic activities;Therapeutic exercise;Balance  training;Neuromuscular re-education;Patient/family education;Manual techniques;Passive range of motion;Vasopneumatic Device;Joint Manipulations;Taping    PT Next Visit Plan Discuss with pt on updated HEP and check to see if there is a referral for the shoulder.    PT Home Exercise Plan Access Code: [email or web address](knee) and also provided her with  Access Code: [email or web address](shoulder)    Consulted and Agree with Plan of Care Patient             Patient will benefit from skilled therapeutic intervention in order to improve the following deficits and impairments:  Abnormal gait, Decreased activity tolerance, Decreased balance, Decreased endurance, Decreased mobility, Decreased range of motion, Decreased knowledge of use of DME, Decreased scar mobility, Decreased strength, Difficulty walking, Increased edema, Impaired flexibility, Postural dysfunction, Pain  Visit Diagnosis: Acute pain of left knee  Stiffness of left knee, not elsewhere classified  Muscle weakness (generalized)  Localized edema  Other abnormalities of gait and mobility  Unsteadiness on feet     Problem List Patient Active Problem List   Diagnosis Date Noted   Elevated  troponin 08/23/2021   Primary osteoarthritis of left knee 07/11/2021   Status post total left knee replacement 07/11/2021   DJD (degenerative joint disease) of knee 07/11/2021   Great toe pain, left 06/08/2021   Sleep apnea 05/24/2021   Low grade fever 01/17/2020   Frequent urination 10/14/2019   OAB (overactive bladder) 10/14/2019   Trigger middle finger of right hand 11/12/2018   Spondylosis of cervical region without myelopathy or radiculopathy 01/14/2018   Chronic left shoulder pain 01/08/2018   History of bilateral hip replacements 07/19/2017   Rotator cuff tear 02/02/2017   Acquired renal cyst of left kidney 01/01/2016   Osteoarthritis of spine with radiculopathy, 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   DDD (degenerative disc disease), lumbar 03/04/2014    Lumbar facet joint pain 02/24/2014   IBS (irritable bowel syndrome) 05/13/2013   Obesity 11/13/2012   Glaucoma    Type 2 diabetes mellitus, controlled ([place])    Anxiety and depression    Vitreous degeneration, unspecified eye 10/25/2012   HTN (hypertension) 01/06/2012   GERD (gastroesophageal reflux disease) 01/06/2012   Hypercholesteremia 61/91/5502    Marceil Welp Singer, Student-PT 09/19/2021, 3:45 PM  Elsie Ra, PT, DPT 09/19/21 3:54 PM   [facility] [address] [place], [place], [address] Phone: [phone]   Fax:  [phone]  Name: OLEDA BORSKI MRN: [id number] Date of Birth: [birth year]

## 2021-09-21 ENCOUNTER — Ambulatory Visit: Payer: Commercial Managed Care - HMO | Admitting: Orthopaedic Surgery

## 2021-09-21 ENCOUNTER — Encounter: Payer: Commercial Managed Care - HMO | Admitting: Physical Therapy

## 2021-09-23 ENCOUNTER — Telehealth (HOSPITAL_COMMUNITY): Payer: Self-pay | Admitting: *Deleted

## 2021-09-23 NOTE — Telephone Encounter (Signed)
Close encounter 

## 2021-09-27 ENCOUNTER — Other Ambulatory Visit: Payer: Self-pay

## 2021-09-27 ENCOUNTER — Other Ambulatory Visit (HOSPITAL_BASED_OUTPATIENT_CLINIC_OR_DEPARTMENT_OTHER): Payer: Self-pay | Admitting: Orthopaedic Surgery

## 2021-09-27 ENCOUNTER — Ambulatory Visit (HOSPITAL_COMMUNITY)
Admission: RE | Admit: 2021-09-27 | Discharge: 2021-09-27 | Disposition: A | Discharge status: Home / Self Care | Payer: Medicare Other | Source: Ambulatory Visit | Attending: Cardiology | Admitting: Cardiology

## 2021-09-27 DIAGNOSIS — M25511 Pain in right shoulder: Secondary | ICD-10-CM

## 2021-09-27 DIAGNOSIS — I10 Essential (primary) hypertension: Secondary | ICD-10-CM

## 2021-09-27 DIAGNOSIS — R778 Other specified abnormalities of plasma proteins: Secondary | ICD-10-CM

## 2021-09-28 ENCOUNTER — Ambulatory Visit (INDEPENDENT_AMBULATORY_CARE_PROVIDER_SITE_OTHER): Payer: Medicare Other | Admitting: Orthopaedic Surgery

## 2021-09-28 ENCOUNTER — Ambulatory Visit (HOSPITAL_BASED_OUTPATIENT_CLINIC_OR_DEPARTMENT_OTHER)
Admission: RE | Admit: 2021-09-28 | Discharge: 2021-09-28 | Disposition: A | Discharge status: Home / Self Care | Payer: Medicare Other | Source: Ambulatory Visit | Attending: Orthopaedic Surgery | Admitting: Orthopaedic Surgery

## 2021-09-28 DIAGNOSIS — M25512 Pain in left shoulder: Secondary | ICD-10-CM

## 2021-09-28 DIAGNOSIS — G8929 Other chronic pain: Secondary | ICD-10-CM | POA: Diagnosis not present

## 2021-09-28 DIAGNOSIS — M25511 Pain in right shoulder: Secondary | ICD-10-CM | POA: Diagnosis not present

## 2021-09-28 DIAGNOSIS — M75112 Incomplete rotator cuff tear or rupture of left shoulder, not specified as traumatic: Secondary | ICD-10-CM | POA: Diagnosis not present

## 2021-09-28 DIAGNOSIS — Z96611 Presence of right artificial shoulder joint: Secondary | ICD-10-CM | POA: Diagnosis not present

## 2021-09-28 NOTE — Progress Notes (Signed)
Chief Complaint: Bilateral shoulder pain     History of Present Illness:    Melissa [name] is a 71 y.o. female presents today for follow-up of both of her shoulders.  She is status post right reverse shoulder arthroplasty done at [facility] February 23, 2021.  Unfortunately she did have quite a significant reaction to the surgical glue that was used postoperatively.  As result she was unable to get physical therapy as a result of this prolonged recovery.  She presents today here for further follow-up and discussion.  Overall she feels like the right shoulder is stuck and quite limited in terms of motion.  She is continue to experience pain about the lateral aspect of the shoulder.  With regard to the left shoulder she did have a rotator cuf that was repaired 4 years prior.  She was doing very well until recently.  Over the last several months she has had pain and weakness with difficulty laying directly on the side.    Surgical History:   None  PMH/PSH/Family History/Social History/Meds/Allergies:    Past Medical History:  Diagnosis Date   Allergy    Anxiety    Arthritis    s/p R TKR   Cyst of left kidney 2015   by lumbar MRI pending renal US   DDD (degenerative disc disease), lumbar 03/2014   severe L2-3 with L HNP with L2/3 nerve root impingement [name])   Depression with anxiety    Diabetes type 2, controlled ([place]) 2011   borderline   Glaucoma    History of ulcer disease    HLD (hyperlipidemia)    no meds taken   Hypertension    Plantar fasciitis of left foot 10/01/2013   PONV (postoperative nausea and vomiting)    Right-sided face pain 01/07/2019   Sleep apnea 2022   Past Surgical History:  Procedure Laterality Date   ABDOMINAL HYSTERECTOMY  2007   fibroids, heavy bleeding   ARTERY BIOPSY Right 01/23/2019   Procedure: REMOVAL OF PART OF RIGHT TEMPORAL ARTERY FOR BIOPSY;  Surgeon: Michael Boston, MD;  Location: [place];  Service:  General;  Laterality: Right;   CATARACT EXTRACTION W/ INTRAOCULAR LENS IMPLANT Bilateral    COLONOSCOPY  04/2017   TA, diverticulosis, rpt ? ([facility])   GLAUCOMA SURGERY     HAMMER TOE SURGERY Bilateral    Right hip replacement     TONSILLECTOMY     TOTAL HIP ARTHROPLASTY Left 2021   TOTAL KNEE ARTHROPLASTY Right 2004   TKR (Dr. Eddie Dibbles with [facility])   TOTAL KNEE ARTHROPLASTY Left 07/11/2021   Procedure: LEFT TOTAL KNEE ARTHROPLASTY;  Surgeon: Leandrew Koyanagi, MD;  Location: [place];  Service: Orthopedics;  Laterality: Left;   TOTAL KNEE REVISION Right 03/2014   Dr Al [facility]   TOTAL SHOULDER ARTHROPLASTY Right 2022   Social History   Socioeconomic History   Marital status: Single    Spouse name: Not on file   Number of children: 0   Years of education: 16   Highest education level: Not on file  Occupational History   Occupation: work with disabilities/ Retired    Fish farm manager: [employer]  Tobacco Use   Smoking status: Never   Smokeless tobacco: Never  Vaping Use   Vaping Use: Never used  Substance and  Sexual Activity   Alcohol use: No   Drug use: No   Sexual activity: Not Currently    Birth control/protection: Surgical  Other Topics Concern   Not on file  Social History Narrative   Caffeine: rare   Lives alone   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[employer]   Fun/Hobbies: Gardening    Social Determinants of Health   Financial Resource Strain: Not on file  Food Insecurity: Not on file  Transportation Needs: Not on file  Physical Activity: Not on file  Stress: Not on file  Social Connections: Not on file   Family History  Problem Relation Age of Onset   Cancer Maternal Grandmother 69       ovarian   Diabetes Maternal Grandmother    Hypertension Maternal Grandmother    Cancer Mother 92       bone, MM   Stroke Other        unsure who   CAD Neg Hx    Colon cancer Neg Hx    Esophageal cancer Neg Hx    Rectal cancer Neg Hx    Stomach cancer Neg Hx     Allergies  Allergen Reactions   Codeine Other (See Comments)    palpitations   Metformin And Related Nausea Only   Other     Derma bond - contact dermatitis   Oxycodone Nausea And Vomiting   Current Outpatient Medications  Medication Sig Dispense Refill   methocarbamol (ROBAXIN) 500 MG tablet Take 1 tablet (500 mg total) by mouth 2 (two) times daily as needed. 20 tablet 0   Accu-Chek FastClix Lancets MISC USE TO TEST BLOOD SUGAR UP TO FOUR TIMES DAILY 306 each 1   ACCU-CHEK GUIDE test strip USE AS DIRECTED FOUR TIMES DAILY 100 strip 11   ALPRAZolam (XANAX) 0.25 MG tablet TAKE 1 TABLET(0.25 MG) BY MOUTH AT BEDTIME AS NEEDED FOR SLEEP 30 tablet 5   aspirin EC 81 MG tablet Take 1 tablet (81 mg total) by mouth 2 (two) times daily. To be taken after surgery 84 tablet 0   blood glucose meter kit and supplies KIT Dispense based on patient and insurance preference. Use up to four times daily as directed. DX Code: E11.9 1 each 0   Crisaborole (EUCRISA) 2 % OINT Apply 1 application topically daily as needed (Itchy skin). 60 g 5   cycloSPORINE (RESTASIS) 0.05 % ophthalmic emulsion Place 1 drop into both eyes 2 (two) times daily as needed (dry eyes).     desonide (DESOWEN) 0.05 % ointment Apply 1 application topically 2 (two) times daily as needed. 60 g 5   glipiZIDE (GLUCOTROL XL) 2.5 MG 24 hr tablet Take 2.5 mg by mouth daily with breakfast.     hydrochlorothiazide (HYDRODIURIL) 25 MG tablet Take 1 tablet (25 mg total) by mouth daily. (Patient taking differently: Take 25 mg by mouth daily as needed (swelling).) 30 tablet 0   hydroquinone 4 % cream Apply to darker areas of skin once a day as needed 28.35 g 3   levocetirizine (XYZAL) 5 MG tablet Take 1 tablet (5 mg total) by mouth every evening. 30 tablet 5   lisinopril (ZESTRIL) 20 MG tablet Take 1 tablet (20 mg total) by mouth daily. 90 tablet 3   MULTIPLE VITAMIN PO Take 1 tablet by mouth daily.     omeprazole (PRILOSEC) 20 MG capsule Take 20 mg  by mouth daily as needed (heartburn).     TRULICITY 1.5 WC/3.7SE SOPN Inject 1.5  mg into the skin every Friday.     VYZULTA 0.024 % SOLN Place 1 drop into both eyes at bedtime.     No current facility-administered medications for this visit.   No results found.  Review of Systems:   A ROS was performed including pertinent positives and negatives as documented in the HPI.  Physical Exam :   Constitutional: NAD and appears stated age Neurological: Alert and oriented Psych: Appropriate affect and cooperative There were no vitals taken for this visit.   Comprehensive Musculoskeletal Exam:    Musculoskeletal Exam    Inspection Right Left  Skin No atrophy or winging No atrophy or winging  Palpation    Tenderness Lateral deltoid Lateral deltoid  Range of Motion    Flexion (passive) 140 160  Flexion (active) 110 150  Abduction 100 1500  ER at the side 30 40  Can reach behind back to Sacrum L5  Strength     Weakness Positive weakness supraspinatus  Special Tests    Pseudoparalytic No No  Neurologic    Fires PIN, radial, median, ulnar, musculocutaneous, axillary, suprascapular, long thoracic, and spinal accessory innervated muscles. No abnormal sensibility  Vascular/Lymphatic    Radial Pulse 2+ 2+  Cervical Exam    Patient has symmetric cervical range of motion with negative Spurling's test.  Special Test: Right skin incision is well-healed, positive drop arm on the left with weakness     Imaging:   Xray (3 views right shoulder, 3 views left shoulder): She is status post right reverse shoulder arthroplasty without evidence of complication or hardware failure, with regard to the left shoulder she has hardware in place consistent with rotator cuff tear.  The glenohumeral space is maintained   I personally reviewed and interpreted the radiographs.   Assessment:   71 year old female with right shoulder pain and weakness following reverse arthroplasty done approximately 7-1/2  months prior.  Overall I do believe and agree that her rehab was somewhat slower and limited as result of her skin complication from the Dermabond glue.  She does have a somewhat weak scapular muscle at this time.  We discussed the role of the scapular strength in the deltoid with regard to a reverse shoulder replacement.  This time I would like her to begin physical therapy for the right shoulder as well.  With regard to the left shoulder I am concerned specifically that she has had a retear of her rotator cuff.  This is evident based on her weakness and pain.  At this time I would like an MRI so that we can specifically assess the status of her rotator cuff repair.  Plan :    -Plan for MRI of the left shoulder and return to clinic following this to discuss results   I believe that advance imaging in the form of an MRI is indicated for the following reasons: -Xrays images were obtained and not diagnostic -The patient has failed treatment modalities including activity restriction, NSAIDs -The following worrisome symptoms are present on history and exam: Weakness following a previous rotator cuff repair       I personally saw and evaluated the patient, and participated in the management and treatment plan.  Vanetta Mulders, MD Attending Physician, Orthopedic Surgery  This document was dictated using Dragon voice recognition software. A reasonable attempt at proof reading has been made to minimize errors.

## 2021-09-29 ENCOUNTER — Ambulatory Visit: Payer: Self-pay

## 2021-09-29 ENCOUNTER — Ambulatory Visit (INDEPENDENT_AMBULATORY_CARE_PROVIDER_SITE_OTHER): Payer: Medicare Other | Admitting: Orthopaedic Surgery

## 2021-09-29 ENCOUNTER — Encounter: Payer: Self-pay | Admitting: Orthopaedic Surgery

## 2021-09-29 ENCOUNTER — Other Ambulatory Visit: Payer: Self-pay

## 2021-09-29 DIAGNOSIS — Z96652 Presence of left artificial knee joint: Secondary | ICD-10-CM

## 2021-09-29 MED ORDER — NAPROXEN 500 MG PO TABS: 500.0000 mg | ORAL_TABLET | Freq: Two times a day (BID) | ORAL | 2 refills | Status: DC

## 2021-09-29 NOTE — Progress Notes (Signed)
Post-Op Visit Note   Patient: Melissa [name]           Date of Birth: 1951/05/15           MRN: [id number] Visit Date: 09/29/2021 PCP: Angelica Pou, MD   Assessment & Plan:  Chief Complaint:  Chief Complaint  Patient presents with   Left Knee - Pain   Visit Diagnoses:  1. Hx of total knee replacement, left     Plan: Patient is a pleasant 71 year old female who comes in today 3 months status post left total knee replacement 07/11/2021.  She has been doing well.  She denies any pain but does have some stiffness especially over the past 2 weeks she has not been in physical therapy due to right shoulder pain.  She does note that she is planning to restart soon as possible.  Examination of her left knee reveals a fully healed surgical scar without complication.  Range of motion 5 to 100 degrees.  She is stable to valgus varus stress.  She is neurovascular tact distally.  At this point, I will put a new referral in for physical therapy where she will continue to work on a home exercise program as well.  Dental prophylaxis reinforced.  Follow-up with [place] in 3 months time for repeat evaluation and 2 view x-rays of the left knee.  Call with concerns or questions in the meantime.  Follow-Up Instructions: Return in about 3 months (around 12/28/2021).   Orders:  Orders Placed This Encounter  Procedures   XR KNEE 3 VIEW LEFT   Ambulatory referral to Physical Therapy   Meds ordered this encounter  Medications   naproxen (NAPROSYN) 500 MG tablet    Sig: Take 1 tablet (500 mg total) by mouth 2 (two) times daily with a meal.    Dispense:  60 tablet    Refill:  2    Imaging: No results found.  PMFS History: Patient Active Problem List   Diagnosis Date Noted   Elevated troponin 08/23/2021   Primary osteoarthritis of left knee 07/11/2021   Status post total left knee replacement 07/11/2021   DJD (degenerative joint disease) of knee 07/11/2021   Great toe pain, left 06/08/2021    Sleep apnea 05/24/2021   Low grade fever 01/17/2020   Frequent urination 10/14/2019   OAB (overactive bladder) 10/14/2019   Trigger middle finger of right hand 11/12/2018   Spondylosis of cervical region without myelopathy or radiculopathy 01/14/2018   Chronic left shoulder pain 01/08/2018   History of bilateral hip replacements 07/19/2017   Rotator cuff tear 02/02/2017   Acquired renal cyst of left kidney 01/01/2016   Osteoarthritis of spine with radiculopathy, 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   DDD (degenerative disc disease), lumbar 03/04/2014   Lumbar facet joint pain 02/24/2014   IBS (irritable bowel syndrome) 05/13/2013   Obesity 11/13/2012   Glaucoma    Type 2 diabetes mellitus, controlled ([place])    Anxiety and depression    Vitreous degeneration, unspecified eye 10/25/2012   HTN (hypertension) 01/06/2012   GERD (gastroesophageal reflux disease) 01/06/2012   Hypercholesteremia 01/06/2012   Past Medical History:  Diagnosis Date   Allergy    Anxiety    Arthritis    s/p R TKR   Cyst of left kidney 2015   by lumbar MRI pending renal US   DDD (degenerative disc disease), lumbar 03/2014   severe L2-3 with L HNP with L2/3 nerve root impingement [name])   Depression with anxiety  Diabetes type 2, controlled ([place]) 2011   borderline   Glaucoma    History of ulcer disease    HLD (hyperlipidemia)    no meds taken   Hypertension    Plantar fasciitis of left foot 10/01/2013   PONV (postoperative nausea and vomiting)    Right-sided face pain 01/07/2019   Sleep apnea 2022    Family History  Problem Relation Age of Onset   Cancer Maternal Grandmother 69       ovarian   Diabetes Maternal Grandmother    Hypertension Maternal Grandmother    Cancer Mother 12       bone, MM   Stroke Other        unsure who   CAD Neg Hx    Colon cancer Neg Hx    Esophageal cancer Neg Hx    Rectal cancer Neg Hx    Stomach cancer Neg Hx     Past Surgical History:  Procedure Laterality Date    ABDOMINAL HYSTERECTOMY  2007   fibroids, heavy bleeding   ARTERY BIOPSY Right 01/23/2019   Procedure: REMOVAL OF PART OF RIGHT TEMPORAL ARTERY FOR BIOPSY;  Surgeon: Michael Boston, MD;  Location: [facility];  Service: General;  Laterality: Right;   CATARACT EXTRACTION W/ INTRAOCULAR LENS IMPLANT Bilateral    COLONOSCOPY  04/2017   TA, diverticulosis, rpt ? ([facility])   GLAUCOMA SURGERY     HAMMER TOE SURGERY Bilateral    Right hip replacement     TONSILLECTOMY     TOTAL HIP ARTHROPLASTY Left 2021   TOTAL KNEE ARTHROPLASTY Right 2004   TKR (Dr. Eddie Dibbles with [facility])   TOTAL KNEE ARTHROPLASTY Left 07/11/2021   Procedure: LEFT TOTAL KNEE ARTHROPLASTY;  Surgeon: Leandrew Koyanagi, MD;  Location: [facility];  Service: Orthopedics;  Laterality: Left;   TOTAL KNEE REVISION Right 03/2014   Dr Al [facility]   TOTAL SHOULDER ARTHROPLASTY Right 2022   Social History   Occupational History   Occupation: work with disabilities/ Retired    Fish farm manager: [employer]  Tobacco Use   Smoking status: Never   Smokeless tobacco: Never  Vaping Use   Vaping Use: Never used  Substance and Sexual Activity   Alcohol use: No   Drug use: No   Sexual activity: Not Currently    Birth control/protection: Surgical

## 2021-09-30 DIAGNOSIS — E119 Type 2 diabetes mellitus without complications: Secondary | ICD-10-CM | POA: Diagnosis not present

## 2021-09-30 DIAGNOSIS — H401133 Primary open-angle glaucoma, bilateral, severe stage: Secondary | ICD-10-CM | POA: Diagnosis not present

## 2021-09-30 DIAGNOSIS — Z794 Long term (current) use of insulin: Secondary | ICD-10-CM | POA: Diagnosis not present

## 2021-09-30 DIAGNOSIS — I10 Essential (primary) hypertension: Secondary | ICD-10-CM | POA: Diagnosis not present

## 2021-10-05 ENCOUNTER — Telehealth (HOSPITAL_COMMUNITY): Payer: Self-pay | Admitting: *Deleted

## 2021-10-05 NOTE — Telephone Encounter (Signed)
Close encounter 

## 2021-10-06 ENCOUNTER — Inpatient Hospital Stay (HOSPITAL_COMMUNITY): Admission: RE | Admit: 2021-10-06 | Payer: Medicare Other | Source: Ambulatory Visit

## 2021-10-10 ENCOUNTER — Encounter (HOSPITAL_BASED_OUTPATIENT_CLINIC_OR_DEPARTMENT_OTHER): Payer: Self-pay | Admitting: Orthopaedic Surgery

## 2021-10-10 ENCOUNTER — Ambulatory Visit: Payer: Medicare Other | Admitting: Physical Therapy

## 2021-10-11 ENCOUNTER — Ambulatory Visit: Payer: Medicare Other | Admitting: Physical Therapy

## 2021-10-12 ENCOUNTER — Telehealth (HOSPITAL_COMMUNITY): Payer: Self-pay | Admitting: *Deleted

## 2021-10-12 NOTE — Telephone Encounter (Signed)
Close encounter 

## 2021-10-13 ENCOUNTER — Other Ambulatory Visit: Payer: Self-pay

## 2021-10-13 ENCOUNTER — Ambulatory Visit (HOSPITAL_COMMUNITY)
Admission: RE | Admit: 2021-10-13 | Discharge: 2021-10-13 | Disposition: A | Discharge status: Home / Self Care | Payer: Medicare Other | Source: Ambulatory Visit | Attending: Cardiovascular Disease | Admitting: Cardiovascular Disease

## 2021-10-13 DIAGNOSIS — I10 Essential (primary) hypertension: Secondary | ICD-10-CM | POA: Insufficient documentation

## 2021-10-13 DIAGNOSIS — R778 Other specified abnormalities of plasma proteins: Secondary | ICD-10-CM | POA: Insufficient documentation

## 2021-10-13 LAB — EXERCISE TOLERANCE TEST
Angina Index: 0
Duke Treadmill Score: 4
Estimated workload: 5.8
Exercise duration (min): 4 min
Exercise duration (sec): 1 s
MPHR: 150 {beats}/min
Peak HR: 139 {beats}/min
Percent HR: 93 %
Rest HR: 71 {beats}/min
ST Depression (mm): 0 mm

## 2021-10-14 DIAGNOSIS — H26492 Other secondary cataract, left eye: Secondary | ICD-10-CM | POA: Diagnosis not present

## 2021-10-14 DIAGNOSIS — H401133 Primary open-angle glaucoma, bilateral, severe stage: Secondary | ICD-10-CM | POA: Diagnosis not present

## 2021-10-14 DIAGNOSIS — E119 Type 2 diabetes mellitus without complications: Secondary | ICD-10-CM | POA: Diagnosis not present

## 2021-10-14 DIAGNOSIS — Z794 Long term (current) use of insulin: Secondary | ICD-10-CM | POA: Diagnosis not present

## 2021-10-17 ENCOUNTER — Ambulatory Visit (HOSPITAL_BASED_OUTPATIENT_CLINIC_OR_DEPARTMENT_OTHER): Payer: Medicare Other | Admitting: Physical Therapy

## 2021-10-19 ENCOUNTER — Ambulatory Visit (HOSPITAL_BASED_OUTPATIENT_CLINIC_OR_DEPARTMENT_OTHER): Payer: Medicare Other | Attending: Orthopaedic Surgery | Admitting: Physical Therapy

## 2021-10-19 ENCOUNTER — Encounter (HOSPITAL_BASED_OUTPATIENT_CLINIC_OR_DEPARTMENT_OTHER): Payer: Self-pay | Admitting: Physical Therapy

## 2021-10-19 ENCOUNTER — Encounter: Payer: Self-pay | Admitting: *Deleted

## 2021-10-19 ENCOUNTER — Other Ambulatory Visit: Payer: Self-pay

## 2021-10-19 DIAGNOSIS — M25562 Pain in left knee: Secondary | ICD-10-CM | POA: Insufficient documentation

## 2021-10-19 DIAGNOSIS — G8929 Other chronic pain: Secondary | ICD-10-CM | POA: Insufficient documentation

## 2021-10-19 DIAGNOSIS — M25662 Stiffness of left knee, not elsewhere classified: Secondary | ICD-10-CM | POA: Diagnosis not present

## 2021-10-19 DIAGNOSIS — M6281 Muscle weakness (generalized): Secondary | ICD-10-CM | POA: Diagnosis not present

## 2021-10-19 DIAGNOSIS — M25511 Pain in right shoulder: Secondary | ICD-10-CM | POA: Insufficient documentation

## 2021-10-19 DIAGNOSIS — M25611 Stiffness of right shoulder, not elsewhere classified: Secondary | ICD-10-CM | POA: Diagnosis not present

## 2021-10-19 DIAGNOSIS — Z96652 Presence of left artificial knee joint: Secondary | ICD-10-CM | POA: Diagnosis not present

## 2021-10-19 NOTE — Therapy (Signed)
OUTPATIENT PHYSICAL THERAPY LOWER EXTREMITY EVALUATION   Patient Name: Melissa [name] MRN: [id number] DOB:1951-06-13, 71 y.o., female Today's Date: 10/19/2021   PT End of Session - 10/19/21 1033     Visit Number 1    Number of Visits 23    Date for PT Re-Evaluation 01/17/22    Authorization Type UHC Medicare    PT Start Time 0930    PT Stop Time 4650    PT Time Calculation (min) 45 min    Activity Tolerance Patient tolerated treatment well    Behavior During Therapy WFL for tasks assessed/performed             Past Medical History:  Diagnosis Date   Allergy    Anxiety    Arthritis    s/p R TKR   Cyst of left kidney 2015   by lumbar MRI pending renal US   DDD (degenerative disc disease), lumbar 03/2014   severe L2-3 with L HNP with L2/3 nerve root impingement [name])   Depression with anxiety    Diabetes type 2, controlled ([place]) 2011   borderline   Glaucoma    History of ulcer disease    HLD (hyperlipidemia)    no meds taken   Hypertension    Plantar fasciitis of left foot 10/01/2013   PONV (postoperative nausea and vomiting)    Right-sided face pain 01/07/2019   Sleep apnea 2022   Past Surgical History:  Procedure Laterality Date   ABDOMINAL HYSTERECTOMY  2007   fibroids, heavy bleeding   ARTERY BIOPSY Right 01/23/2019   Procedure: REMOVAL OF PART OF RIGHT TEMPORAL ARTERY FOR BIOPSY;  Surgeon: Michael Boston, MD;  Location: [facility];  Service: General;  Laterality: Right;   CATARACT EXTRACTION W/ INTRAOCULAR LENS IMPLANT Bilateral    COLONOSCOPY  04/2017   TA, diverticulosis, rpt ? ([facility])   GLAUCOMA SURGERY     HAMMER TOE SURGERY Bilateral    Right hip replacement     TONSILLECTOMY     TOTAL HIP ARTHROPLASTY Left 2021   TOTAL KNEE ARTHROPLASTY Right 2004   TKR (Dr. Eddie Dibbles with [facility])   TOTAL KNEE ARTHROPLASTY Left 07/11/2021   Procedure: LEFT TOTAL KNEE ARTHROPLASTY;  Surgeon: Leandrew Koyanagi, MD;  Location: [facility];  Service: Orthopedics;   Laterality: Left;   TOTAL KNEE REVISION Right 03/2014   Dr Al Corpus [place] Right 2022   Patient Active Problem List   Diagnosis Date Noted   Elevated troponin 08/23/2021   Primary osteoarthritis of left knee 07/11/2021   Status post total left knee replacement 07/11/2021   DJD (degenerative joint disease) of knee 07/11/2021   Great toe pain, left 06/08/2021   Sleep apnea 05/24/2021   Low grade fever 01/17/2020   Frequent urination 10/14/2019   OAB (overactive bladder) 10/14/2019   Trigger middle finger of right hand 11/12/2018   Spondylosis of cervical region without myelopathy or radiculopathy 01/14/2018   Chronic left shoulder pain 01/08/2018   History of bilateral hip replacements 07/19/2017   Rotator cuff tear 02/02/2017   Acquired renal cyst of left kidney 01/01/2016   Osteoarthritis of spine with radiculopathy, 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   DDD (degenerative disc disease), lumbar 03/04/2014   Lumbar facet joint pain 02/24/2014   IBS (irritable bowel syndrome) 05/13/2013   Obesity 11/13/2012   Glaucoma    Type 2 diabetes mellitus, controlled ([place])    Anxiety and depression    Vitreous degeneration, unspecified eye 10/25/2012  HTN (hypertension) 01/06/2012   GERD (gastroesophageal reflux disease) 01/06/2012   Hypercholesteremia 01/06/2012    PCP: Angelica Pou, MD  REFERRING PROVIDER: Angelica Pou, MD  REFERRING DIAG: [phone] (ICD-10-CM) - Hx of total knee replacement, left; M25.511,G89.29 (ICD-10-CM) - Chronic right shoulder pain  THERAPY DIAG:  Acute pain of left knee - Plan: PT plan of care cert/re-cert  Stiffness of left knee, not elsewhere classified - Plan: PT plan of care cert/re-cert  Muscle weakness (generalized) - Plan: PT plan of care cert/re-cert  Decreased right shoulder range of motion - Plan: PT plan of care cert/re-cert  ONSET DATE: 78/02/7671 L TKA; 02/2021 R TSA  SUBJECTIVE:   SUBJECTIVE STATEMENT: She is  status post right reverse shoulder arthroplasty done at [facility] February 16, 2021.  She had a bad reaction to the surgical glue that was used. "Looked like someone threw acid on it." She had to have it immobilized for a month. She did have an infection due to the dermabond.  She feels like the shoulder is stiff at this time and is weak and feels the deltoid is "knotted up. She has issues with reaching. She was unable to do anything on that shoulder for a while so she has been doing AROM on her own. Reaching BHB is painful and tight. She states the showering is still painful, reaching out of the cabinet is painful. She feels like something is stuck in the front.  She states she had L RTC repair that is not going well. It is still hurting with reaching and lifting.  Pt denies NT into hands or in knee. MRI planned 2/16 for L shoulder.   Pt had a TKA 07/2021.  Pt denies pain but does have stiffness especially over the past 2 weeks. She states that the keloid scar is what hurts. It feels very hurts. Pt states she has issues with stairs getting up and down.   PERTINENT HISTORY: DJD, DM2, Anxiety, depression, bilat THA  PAIN:  Are you having pain? No   PRECAUTIONS: None  WEIGHT BEARING RESTRICTIONS No  FALLS:  Has patient fallen in last 6 months? No, Number of falls: 0  LIVING ENVIRONMENT: Lives with: lives alone Lives in: House/apartment Stairs: no Has following equipment at home: Single point cane  OCCUPATION: working in group homes  PLOF: Independent with basic ADLs  PATIENT GOALS : Pt would like to get her arm back to normal and get her knee bending again. "I would like to loosen up."   OBJECTIVE:   DIAGNOSTIC FINDINGS: IMPRESSION: Status post reverse total right shoulder arthroplasty without evidence of hardware failure.   PATIENT SURVEYS:  FOTO 49 59 at D/C 10 pts MCII  COGNITION:  Overall cognitive status: Within functional limits for tasks  assessed     SENSATION:  Light touch: Appears intact     POSTURE:   Forward shoulders, mild kyphosis Flexed L knee in standing   PALPATION: Hypertonicity and high TTP, of R deltoid, UT, and biceps  Increased scar tissue size around L knee     LE AROM/PROM:  A/PROM Right 10/19/2021 Left 10/19/2021  Knee flexion 115 105  Knee extension 0 -10   (Blank rows = not tested)  LE MMT:  MMT Right 10/19/2021 Left 10/19/2021  Hip flexion 4/5 4/5  Hip abduction 4/5 4/5  Hip adduction 4/5 4/5  Knee flexion 4/5 4/5  Knee extension 4/5 4/5   (Blank rows = not tested)  UE AROM: Flexion 100, ABD 95,  IR SIJ BHB reach, extension 25 deg, ER C5 BHB reach  UE MMT: unable to test due to increase in pain after AROM  FUNCTIONAL TESTS:  5 times sit to stand: 20s  GAIT: Distance walked: 6ft Assistive device utilized: None Level of assistance: Complete Independence Comments: antalgic gait, decreased stance on L, decreased toe off   Stairs: significant UE support needed, step to pattern, cuing needed for sequencing    TODAY'S TREATMENT: Exercises Seated Long Arc Quad - 2 x daily - 7 x weekly - 3 sets - 10 reps - 3 hold Seated Scapular Retraction - 2 x daily - 7 x weekly - 1 sets - 20 reps Sit to Stand with Arms Crossed - 1 x daily - 7 x weekly - 3 sets - 10 reps Standing Isometric Shoulder Flexion with Doorway - Arm Bent - 1 x daily - 7 x weekly - 2 sets - 10 reps - 3 hold   PATIENT EDUCATION:  Education details: MOI, diagnosis, prognosis, anatomy, exercise progression, DOMS expectations, muscle firing,  envelope of function, HEP, POC  Person educated: Patient Education method: Explanation, Demonstration, Tactile cues, Verbal cues, and Handouts Education comprehension: verbalized understanding, returned demonstration, verbal cues required, and tactile cues required   HOME EXERCISE PROGRAM: Access Code: [email or web address] URL: [email or web address] Date:  10/19/2021 Prepared by: Daleen Bo     ASSESSMENT:  CLINICAL IMPRESSION: Patient is a 71 y.o. female who was seen today for physical therapy evaluation and treatment for cc of R shoulder stiffness and L knee stiffness. Pt's s/s appear consistent with R shoulder joint stiffness and L knee stiffness and weakness 2/2 surgery and immobilization. Pt is highly sensitive to touch at the shoulder as well as the knee. Pt would benefit from continued skilled therapy in order to reach goals and maximize functional R UE and L knee strength and ROM for full return to work and normal ADL.     OBJECTIVE IMPAIRMENTS Abnormal gait, decreased activity tolerance, decreased balance, decreased coordination, decreased endurance, decreased mobility, difficulty walking, decreased ROM, decreased strength, hypomobility, increased edema, increased fascial restrictions, increased muscle spasms, impaired flexibility, impaired UE functional use, improper body mechanics, postural dysfunction, and obesity.   ACTIVITY LIMITATIONS cleaning, community activity, driving, meal prep, occupation, laundry, yard work, shopping, and Naval architect .   PERSONAL FACTORS Age, Behavior pattern, Fitness, Past/current experiences, Time since onset of injury/illness/exacerbation, and 1-2 comorbidities:    are also affecting patient's functional outcome.    REHAB POTENTIAL: Good  CLINICAL DECISION MAKING: Evolving/moderate complexity  EVALUATION COMPLEXITY: Moderate   GOALS:   SHORT TERM GOALS:  STG Name Target Date Goal status  1 Pt will become independent with HEP in order to demonstrate synthesis of PT education.  Baseline:  11/30/2021 INITIAL  2 Pt will be able to demonstrate/report ability to walk >10 mins without pain in order to demonstrate functional improvement and tolerance to exercise and community mobility.  Baseline:  11/30/2021 INITIAL  3 Pt will be able to demonstrate BHB reach without pain  in order to  demonstrate functional improvement in UE/LE function for self-care and house hold duties.   Baseline: 11/30/2021 INITIAL   LONG TERM GOALS:   LTG Name Target Date Goal status  1 Pt  will become independent with final HEP in order to demonstrate synthesis of PT education.  Baseline: 01/11/2022 INITIAL  2 Pt will be able to reach [facility] and carry/hold >3 lbs in order to demonstrate functional improvement in L UE strength for return to PLOF.  Baseline: 01/11/2022 INITIAL  3 Pt will score >/= 59 on FOTO to demonstrate improvement in perceived knee and shoulder function.  01/11/2022 INITIAL  4 Pt will decrease 5XSTS by at least 3 seconds in order to demonstrate clinically significant improvement in LE strength  Baseline: 01/11/2022 INITIAL   PLAN: PT FREQUENCY: 1-2x/week  PT DURATION: 12 weeks (likely D/C by 8 weeks)  PLANNED INTERVENTIONS: Therapeutic exercises, Therapeutic activity, Neuro Muscular re-education, Balance training, Gait training, Patient/Family education, Joint mobilization, Stair training, Orthotic/Fit training, DME instructions, Aquatic Therapy, Dry Needling, Electrical stimulation, Spinal mobilization, Cryotherapy, Moist heat, scar mobilization, Taping, Vasopneumatic device, Traction, Ultrasound, Ionotophoresis 4mg /ml Dexamethasone, and Manual therapy  PLAN FOR NEXT SESSION: review HEP, light shoulder ROM, knee strength and CKC   Daleen Bo, PT 10/19/2021, 12:45 PM

## 2021-10-20 ENCOUNTER — Ambulatory Visit
Admission: RE | Admit: 2021-10-20 | Discharge: 2021-10-20 | Disposition: A | Discharge status: Home / Self Care | Payer: Medicare Other | Source: Ambulatory Visit | Attending: Orthopaedic Surgery | Admitting: Orthopaedic Surgery

## 2021-10-20 DIAGNOSIS — S46012A Strain of muscle(s) and tendon(s) of the rotator cuff of left shoulder, initial encounter: Secondary | ICD-10-CM | POA: Diagnosis not present

## 2021-10-20 DIAGNOSIS — M25412 Effusion, left shoulder: Secondary | ICD-10-CM | POA: Diagnosis not present

## 2021-10-20 DIAGNOSIS — G8929 Other chronic pain: Secondary | ICD-10-CM

## 2021-10-20 DIAGNOSIS — M25512 Pain in left shoulder: Secondary | ICD-10-CM

## 2021-10-21 DIAGNOSIS — H26491 Other secondary cataract, right eye: Secondary | ICD-10-CM | POA: Diagnosis not present

## 2021-10-26 ENCOUNTER — Ambulatory Visit (INDEPENDENT_AMBULATORY_CARE_PROVIDER_SITE_OTHER): Payer: Medicare Other | Admitting: Orthopaedic Surgery

## 2021-10-26 ENCOUNTER — Other Ambulatory Visit: Payer: Self-pay

## 2021-10-26 ENCOUNTER — Ambulatory Visit (HOSPITAL_BASED_OUTPATIENT_CLINIC_OR_DEPARTMENT_OTHER): Payer: Medicare Other | Admitting: Physical Therapy

## 2021-10-26 DIAGNOSIS — Z96611 Presence of right artificial shoulder joint: Secondary | ICD-10-CM | POA: Diagnosis not present

## 2021-10-26 NOTE — Progress Notes (Signed)
Chief Complaint: Bilateral shoulder pain     History of Present Illness:   10/26/2021: Presents today for follow-up of the MRI of the left shoulder.  Left shoulder continues to have limited motion and pain.  Melissa [name] is a 71 y.o. female presents today for follow-up of both of her shoulders.  She is status post right reverse shoulder arthroplasty done at [facility] February 23, 2021.  Unfortunately she did have quite a significant reaction to the surgical glue that was used postoperatively.  As result she was unable to get physical therapy as a result of this prolonged recovery.  She presents today here for further follow-up and discussion.  Overall she feels like the right shoulder is stuck and quite limited in terms of motion.  She is continue to experience pain about the lateral aspect of the shoulder.  With regard to the left shoulder she did have a rotator cuf that was repaired 4 years prior.  She was doing very well until recently.  Over the last several months she has had pain and weakness with difficulty laying directly on the side.    Surgical History:   None  PMH/PSH/Family History/Social History/Meds/Allergies:    Past Medical History:  Diagnosis Date   Allergy    Anxiety    Arthritis    s/p R TKR   Cyst of left kidney 2015   by lumbar MRI pending renal US   DDD (degenerative disc disease), lumbar 03/2014   severe L2-3 with L HNP with L2/3 nerve root impingement [name])   Depression with anxiety    Diabetes type 2, controlled ([place]) 2011   borderline   Glaucoma    History of ulcer disease    HLD (hyperlipidemia)    no meds taken   Hypertension    Plantar fasciitis of left foot 10/01/2013   PONV (postoperative nausea and vomiting)    Right-sided face pain 01/07/2019   Sleep apnea 2022   Past Surgical History:  Procedure Laterality Date   ABDOMINAL HYSTERECTOMY  2007   fibroids, heavy bleeding   ARTERY BIOPSY Right  01/23/2019   Procedure: REMOVAL OF PART OF RIGHT TEMPORAL ARTERY FOR BIOPSY;  Surgeon: Michael Boston, MD;  Location: [place];  Service: General;  Laterality: Right;   CATARACT EXTRACTION W/ INTRAOCULAR LENS IMPLANT Bilateral    COLONOSCOPY  04/2017   TA, diverticulosis, rpt ? ([facility])   GLAUCOMA SURGERY     HAMMER TOE SURGERY Bilateral    Right hip replacement     TONSILLECTOMY     TOTAL HIP ARTHROPLASTY Left 2021   TOTAL KNEE ARTHROPLASTY Right 2004   TKR (Dr. Eddie Dibbles with [facility])   TOTAL KNEE ARTHROPLASTY Left 07/11/2021   Procedure: LEFT TOTAL KNEE ARTHROPLASTY;  Surgeon: Leandrew Koyanagi, MD;  Location: [place];  Service: Orthopedics;  Laterality: Left;   TOTAL KNEE REVISION Right 03/2014   Dr Al Corpus [facility]   TOTAL SHOULDER ARTHROPLASTY Right 2022   Social History   Socioeconomic History   Marital status: Single    Spouse name: Not on file   Number of children: 0   Years of education: 16   Highest education level: Not on file  Occupational History   Occupation: work with disabilities/ Retired    Fish farm manager: [employer]  Tobacco Use  Smoking status: Never   Smokeless tobacco: Never  Vaping Use   Vaping Use: Never used  Substance and Sexual Activity   Alcohol use: No   Drug use: No   Sexual activity: Not Currently    Birth control/protection: Surgical  Other Topics Concern   Not on file  Social History Narrative   Caffeine: rare   Lives alone   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[employer]   Fun/Hobbies: Gardening    Social Determinants of Health   Financial Resource Strain: Not on file  Food Insecurity: Not on file  Transportation Needs: Not on file  Physical Activity: Not on file  Stress: Not on file  Social Connections: Not on file   Family History  Problem Relation Age of Onset   Cancer Maternal Grandmother 69       ovarian   Diabetes Maternal Grandmother    Hypertension Maternal Grandmother    Cancer Mother 71       bone, MM   Stroke Other         unsure who   CAD Neg Hx    Colon cancer Neg Hx    Esophageal cancer Neg Hx    Rectal cancer Neg Hx    Stomach cancer Neg Hx    Allergies  Allergen Reactions   Codeine Other (See Comments)    palpitations   Metformin And Related Nausea Only   Other     Derma bond - contact dermatitis   Oxycodone Nausea And Vomiting   Current Outpatient Medications  Medication Sig Dispense Refill   methocarbamol (ROBAXIN) 500 MG tablet Take 1 tablet (500 mg total) by mouth 2 (two) times daily as needed. 20 tablet 0   Accu-Chek FastClix Lancets MISC USE TO TEST BLOOD SUGAR UP TO FOUR TIMES DAILY 306 each 1   ACCU-CHEK GUIDE test strip USE AS DIRECTED FOUR TIMES DAILY 100 strip 11   ALPRAZolam (XANAX) 0.25 MG tablet TAKE 1 TABLET(0.25 MG) BY MOUTH AT BEDTIME AS NEEDED FOR SLEEP 30 tablet 5   aspirin EC 81 MG tablet Take 1 tablet (81 mg total) by mouth 2 (two) times daily. To be taken after surgery 84 tablet 0   blood glucose meter kit and supplies KIT Dispense based on patient and insurance preference. Use up to four times daily as directed. DX Code: E11.9 1 each 0   Crisaborole (EUCRISA) 2 % OINT Apply 1 application topically daily as needed (Itchy skin). 60 g 5   cycloSPORINE (RESTASIS) 0.05 % ophthalmic emulsion Place 1 drop into both eyes 2 (two) times daily as needed (dry eyes).     desonide (DESOWEN) 0.05 % ointment Apply 1 application topically 2 (two) times daily as needed. 60 g 5   glipiZIDE (GLUCOTROL XL) 2.5 MG 24 hr tablet Take 2.5 mg by mouth daily with breakfast.     hydrochlorothiazide (HYDRODIURIL) 25 MG tablet Take 1 tablet (25 mg total) by mouth daily. (Patient taking differently: Take 25 mg by mouth daily as needed (swelling).) 30 tablet 0   hydroquinone 4 % cream Apply to darker areas of skin once a day as needed 28.35 g 3   levocetirizine (XYZAL) 5 MG tablet Take 1 tablet (5 mg total) by mouth every evening. 30 tablet 5   lisinopril (ZESTRIL) 20 MG tablet Take 1 tablet (20 mg  total) by mouth daily. 90 tablet 3   MULTIPLE VITAMIN PO Take 1 tablet by mouth daily.     naproxen (NAPROSYN) 500 MG  tablet Take 1 tablet (500 mg total) by mouth 2 (two) times daily with a meal. 60 tablet 2   omeprazole (PRILOSEC) 20 MG capsule Take 20 mg by mouth daily as needed (heartburn).     TRULICITY 1.5 VO/3.5KK SOPN Inject 1.5 mg into the skin every Friday.     VYZULTA 0.024 % SOLN Place 1 drop into both eyes at bedtime.     No current facility-administered medications for this visit.   No results found.  Review of Systems:   A ROS was performed including pertinent positives and negatives as documented in the HPI.  Physical Exam :   Constitutional: NAD and appears stated age Neurological: Alert and oriented Psych: Appropriate affect and cooperative There were no vitals taken for this visit.   Comprehensive Musculoskeletal Exam:    Musculoskeletal Exam    Inspection Right Left  Skin No atrophy or winging No atrophy or winging  Palpation    Tenderness Lateral deltoid Lateral deltoid  Range of Motion    Flexion (passive) 140 160  Flexion (active) 110 150  Abduction 100 1500  ER at the side 30 40  Can reach behind back to Sacrum L5  Strength     Weakness Positive weakness supraspinatus  Special Tests    Pseudoparalytic No No  Neurologic    Fires PIN, radial, median, ulnar, musculocutaneous, axillary, suprascapular, long thoracic, and spinal accessory innervated muscles. No abnormal sensibility  Vascular/Lymphatic    Radial Pulse 2+ 2+  Cervical Exam    Patient has symmetric cervical range of motion with negative Spurling's test.  Special Test: Right skin incision is well-healed, positive drop arm on the left with weakness     Imaging:   Xray (3 views right shoulder, 3 views left shoulder): She is status post right reverse shoulder arthroplasty without evidence of complication or hardware failure, with regard to the left shoulder she has hardware in place  consistent with rotator cuff tear.  The glenohumeral space is maintained  MRI left shoulder: There is a full-thickness rotator cuff tear status post previous repair.  She has significant fatty atrophy with significant retraction  I personally reviewed and interpreted the radiographs.   Assessment:   71 year old female with right shoulder pain and weakness following reverse arthroplasty done approximately 7-1/2 months prior.  Overall I do believe and agree that her rehab was somewhat slower and limited as result of her skin complication from the Dermabond glue.  She does have a somewhat weak scapular muscle at this time.  With regard to the left shoulder she does have a full-thickness essentially nonrepairable rotator cuff tear on the side.  Given the fact that she has previous arthroplasty and evidence of osteoarthritis on MRI I do believe that shoulder arthroplasty would be the optimal procedure for the left.  In specific this would be reverse shoulder arthroplasty.  Given the experience that she did have on the right side however I do not believe that she is necessarily fully recovered from this and her right knee from the left side.  As result we will plan for continue physical therapy on both shoulders.  I will see her back in 3 months for recheck on the right shoulder with x-rays at that time Plan :    -Return to clinic in 3 months for recheck of right shoulder      I personally saw and evaluated the patient, and participated in the management and treatment plan.  Vanetta Mulders, MD Attending Physician, Orthopedic Surgery  This document was dictated using Systems analyst. A reasonable attempt at proof reading has been made to minimize errors.

## 2021-10-27 ENCOUNTER — Encounter (HOSPITAL_BASED_OUTPATIENT_CLINIC_OR_DEPARTMENT_OTHER): Payer: Medicare Other | Admitting: Physical Therapy

## 2021-10-31 ENCOUNTER — Ambulatory Visit (HOSPITAL_BASED_OUTPATIENT_CLINIC_OR_DEPARTMENT_OTHER): Payer: Medicare Other | Admitting: Physical Therapy

## 2021-10-31 ENCOUNTER — Other Ambulatory Visit: Payer: Self-pay

## 2021-10-31 ENCOUNTER — Encounter (HOSPITAL_BASED_OUTPATIENT_CLINIC_OR_DEPARTMENT_OTHER): Payer: Self-pay | Admitting: Physical Therapy

## 2021-10-31 DIAGNOSIS — R600 Localized edema: Secondary | ICD-10-CM

## 2021-10-31 DIAGNOSIS — M25511 Pain in right shoulder: Secondary | ICD-10-CM | POA: Diagnosis not present

## 2021-10-31 DIAGNOSIS — M25662 Stiffness of left knee, not elsewhere classified: Secondary | ICD-10-CM

## 2021-10-31 DIAGNOSIS — M6281 Muscle weakness (generalized): Secondary | ICD-10-CM | POA: Diagnosis not present

## 2021-10-31 DIAGNOSIS — Z96652 Presence of left artificial knee joint: Secondary | ICD-10-CM | POA: Diagnosis not present

## 2021-10-31 DIAGNOSIS — M25611 Stiffness of right shoulder, not elsewhere classified: Secondary | ICD-10-CM

## 2021-10-31 DIAGNOSIS — M25562 Pain in left knee: Secondary | ICD-10-CM | POA: Diagnosis not present

## 2021-10-31 DIAGNOSIS — G8929 Other chronic pain: Secondary | ICD-10-CM | POA: Diagnosis not present

## 2021-10-31 NOTE — Therapy (Signed)
OUTPATIENT PHYSICAL THERAPY TREATMENT   Patient Name: Melissa [name] MRN: [id number] DOB:03-19-1951, 71 y.o., female Today's Date: 10/31/2021   PT End of Session - 10/31/21 1039     Visit Number 2    Number of Visits 23    Date for PT Re-Evaluation 01/17/22    Authorization Type [employer] Medicare    PT Start Time 0932    PT Stop Time 1055    PT Time Calculation (min) 40 min    Activity Tolerance Patient tolerated treatment well    Behavior During Therapy WFL for tasks assessed/performed              Past Medical History:  Diagnosis Date   Allergy    Anxiety    Arthritis    s/p R TKR   Cyst of left kidney 2015   by lumbar MRI pending renal US   DDD (degenerative disc disease), lumbar 03/2014   severe L2-3 with L HNP with L2/3 nerve root impingement [name])   Depression with anxiety    Diabetes type 2, controlled ([place]) 2011   borderline   Glaucoma    History of ulcer disease    HLD (hyperlipidemia)    no meds taken   Hypertension    Plantar fasciitis of left foot 10/01/2013   PONV (postoperative nausea and vomiting)    Right-sided face pain 01/07/2019   Sleep apnea 2022   Past Surgical History:  Procedure Laterality Date   ABDOMINAL HYSTERECTOMY  2007   fibroids, heavy bleeding   ARTERY BIOPSY Right 01/23/2019   Procedure: REMOVAL OF PART OF RIGHT TEMPORAL ARTERY FOR BIOPSY;  Surgeon: Michael Boston, MD;  Location: [place];  Service: General;  Laterality: Right;   CATARACT EXTRACTION W/ INTRAOCULAR LENS IMPLANT Bilateral    COLONOSCOPY  04/2017   TA, diverticulosis, rpt ? ([facility])   GLAUCOMA SURGERY     HAMMER TOE SURGERY Bilateral    Right hip replacement     TONSILLECTOMY     TOTAL HIP ARTHROPLASTY Left 2021   TOTAL KNEE ARTHROPLASTY Right 2004   TKR (Dr. Eddie Dibbles with [facility])   TOTAL KNEE ARTHROPLASTY Left 07/11/2021   Procedure: LEFT TOTAL KNEE ARTHROPLASTY;  Surgeon: Leandrew Koyanagi, MD;  Location: [place];  Service: Orthopedics;  Laterality: Left;    TOTAL KNEE REVISION Right 03/2014   Dr Al Corpus [place] Right 2022   Patient Active Problem List   Diagnosis Date Noted   Elevated troponin 08/23/2021   Primary osteoarthritis of left knee 07/11/2021   Status post total left knee replacement 07/11/2021   DJD (degenerative joint disease) of knee 07/11/2021   Great toe pain, left 06/08/2021   Sleep apnea 05/24/2021   Low grade fever 01/17/2020   Frequent urination 10/14/2019   OAB (overactive bladder) 10/14/2019   Trigger middle finger of right hand 11/12/2018   Spondylosis of cervical region without myelopathy or radiculopathy 01/14/2018   Chronic left shoulder pain 01/08/2018   History of bilateral hip replacements 07/19/2017   Rotator cuff tear 02/02/2017   Acquired renal cyst of left kidney 01/01/2016   Osteoarthritis of spine with radiculopathy, 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   DDD (degenerative disc disease), lumbar 03/04/2014   Lumbar facet joint pain 02/24/2014   IBS (irritable bowel syndrome) 05/13/2013   Obesity 11/13/2012   Glaucoma    Type 2 diabetes mellitus, controlled ([place])    Anxiety and depression    Vitreous degeneration, unspecified eye 10/25/2012  HTN (hypertension) 01/06/2012   GERD (gastroesophageal reflux disease) 01/06/2012   Hypercholesteremia 01/06/2012    PCP: Angelica Pou, MD  REFERRING PROVIDER: Angelica Pou, MD  REFERRING DIAG: [phone] (ICD-10-CM) - Hx of total knee replacement, left; M25.511,G89.29 (ICD-10-CM) - Chronic right shoulder pain  THERAPY DIAG:  Acute pain of left knee  Stiffness of left knee, not elsewhere classified  Muscle weakness (generalized)  Localized edema  Decreased right shoulder range of motion  ONSET DATE: 07/11/2021 L TKA; 02/2021 R TSA  SUBJECTIVE:   SUBJECTIVE STATEMENT: Pt states she did not have pain after last session. She is doing HEP "just about every day."    Eval: Pt had a TKA 07/2021.  Pt denies pain but does have  stiffness especially over the past 2 weeks. She states that the keloid scar is what hurts. It feels very hurts. Pt states she has issues with stairs getting up and down.   PERTINENT HISTORY: DJD, DM2, Anxiety, depression, bilat THA  PAIN:  Are you having pain? No   PRECAUTIONS: None   LIVING ENVIRONMENT: Lives with: lives alone Lives in: House/apartment Stairs: no Has following equipment at home: Single point cane  OCCUPATION: working in group homes  PLOF: Independent with basic ADLs  PATIENT GOALS : Pt would like to get her arm back to normal and get her knee bending again. "I would like to loosen up."   OBJECTIVE:   DIAGNOSTIC FINDINGS: IMPRESSION: Status post reverse total right shoulder arthroplasty without evidence of hardware failure.   PATIENT SURVEYS:  FOTO 49 27 at D/C 10 pts MCII    TODAY'S TREATMENT:  Manual: L knee flexion and TKE tibial ER grade III mob  Exercises Supine AAROM dowel rod 15x SLR 2x10 Seated Long Arc Quad  3lbs- 2 sets - 10 reps - 3 hold Seated Scapular Retraction - 2x20 reps Sit to Stand with Arms Crossed with YTB at knees - 2x 10 reps Supine AAROM flexion 15x Table flexion and ABD 15x each   PATIENT EDUCATION:  Education details: anatomy, exercise progression, DOMS expectations, muscle firing,  envelope of function, HEP  Person educated: Patient Education method: Explanation, Demonstration, Tactile cues, Verbal cues, and Handouts Education comprehension: verbalized understanding, returned demonstration, verbal cues required, and tactile cues required   HOME EXERCISE PROGRAM: Access Code: [email or web address] URL: [email or web address] Date: 10/19/2021 Prepared by: Daleen Bo     ASSESSMENT:  CLINICAL IMPRESSION: Pt with improved knee ROM following joint mobilization and with decreased sensitivity to touch at today's session. Pt able to repeat HEP but requires significant VC and TC for L hip and R shoulder  positioning/awareness. Pt given YTB for STS in order to reduce dynamic valgus and table sliding to improve R shoulder ROM. Plan to continues with L knee and R shoulder ROM as tolerated. Pt would benefit from continued skilled therapy in order to reach goals and maximize functional R UE and L knee strength and ROM for full return to work and normal ADL.     OBJECTIVE IMPAIRMENTS Abnormal gait, decreased activity tolerance, decreased balance, decreased coordination, decreased endurance, decreased mobility, difficulty walking, decreased ROM, decreased strength, hypomobility, increased edema, increased fascial restrictions, increased muscle spasms, impaired flexibility, impaired UE functional use, improper body mechanics, postural dysfunction, and obesity.   ACTIVITY LIMITATIONS cleaning, community activity, driving, meal prep, occupation, laundry, yard work, shopping, and Naval architect .   PERSONAL FACTORS Age, Behavior pattern, Fitness, Past/current experiences, Time since onset of injury/illness/exacerbation, and 1-2 comorbidities:    are also  affecting patient's functional outcome.    REHAB POTENTIAL: Good  CLINICAL DECISION MAKING: Evolving/moderate complexity  EVALUATION COMPLEXITY: Moderate   GOALS:   SHORT TERM GOALS:  STG Name Target Date Goal status  1 Pt will become independent with HEP in order to demonstrate synthesis of PT education.  Baseline:  12/12/2021 INITIAL  2 Pt will be able to demonstrate/report ability to walk >10 mins without pain in order to demonstrate functional improvement and tolerance to exercise and community mobility.  Baseline:  12/12/2021 INITIAL  3 Pt will be able to demonstrate BHB reach without pain  in order to demonstrate functional improvement in UE/LE function for self-care and house hold duties.   Baseline: 12/12/2021 INITIAL   LONG TERM GOALS:   LTG Name Target Date Goal status  1 Pt  will become independent with final HEP in order to  demonstrate synthesis of PT education.  Baseline: 01/23/2022 INITIAL  2 Pt will be able to reach [facility] and carry/hold >3 lbs in order to demonstrate functional improvement in L UE strength for return to PLOF.  Baseline: 01/23/2022 INITIAL  3 Pt will score >/= 59 on FOTO to demonstrate improvement in perceived knee and shoulder function.  01/23/2022 INITIAL  4 Pt will decrease 5XSTS by at least 3 seconds in order to demonstrate clinically significant improvement in LE strength  Baseline: 01/23/2022 INITIAL   PLAN: PT FREQUENCY: 1-2x/week  PT DURATION: 12 weeks (likely D/C by 8 weeks)  PLANNED INTERVENTIONS: Therapeutic exercises, Therapeutic activity, Neuro Muscular re-education, Balance training, Gait training, Patient/Family education, Joint mobilization, Stair training, Orthotic/Fit training, DME instructions, Aquatic Therapy, Dry Needling, Electrical stimulation, Spinal mobilization, Cryotherapy, Moist heat, scar mobilization, Taping, Vasopneumatic device, Traction, Ultrasound, Ionotophoresis 4mg /ml Dexamethasone, and Manual therapy  PLAN FOR NEXT SESSION: review HEP, light shoulder ROM, quad strength   Daleen Bo, PT 10/31/2021, 11:00 AM

## 2021-11-09 ENCOUNTER — Ambulatory Visit (HOSPITAL_BASED_OUTPATIENT_CLINIC_OR_DEPARTMENT_OTHER): Payer: Medicare Other | Admitting: Physical Therapy

## 2021-11-10 DIAGNOSIS — G4733 Obstructive sleep apnea (adult) (pediatric): Secondary | ICD-10-CM | POA: Diagnosis not present

## 2021-11-16 ENCOUNTER — Other Ambulatory Visit: Payer: Self-pay

## 2021-11-16 ENCOUNTER — Ambulatory Visit (HOSPITAL_BASED_OUTPATIENT_CLINIC_OR_DEPARTMENT_OTHER): Payer: Medicare Other | Attending: Orthopaedic Surgery | Admitting: Physical Therapy

## 2021-11-16 ENCOUNTER — Encounter (HOSPITAL_BASED_OUTPATIENT_CLINIC_OR_DEPARTMENT_OTHER): Payer: Self-pay | Admitting: Physical Therapy

## 2021-11-16 DIAGNOSIS — M6281 Muscle weakness (generalized): Secondary | ICD-10-CM | POA: Insufficient documentation

## 2021-11-16 DIAGNOSIS — R600 Localized edema: Secondary | ICD-10-CM | POA: Diagnosis not present

## 2021-11-16 DIAGNOSIS — M25662 Stiffness of left knee, not elsewhere classified: Secondary | ICD-10-CM | POA: Insufficient documentation

## 2021-11-16 DIAGNOSIS — M25611 Stiffness of right shoulder, not elsewhere classified: Secondary | ICD-10-CM | POA: Insufficient documentation

## 2021-11-16 DIAGNOSIS — M25562 Pain in left knee: Secondary | ICD-10-CM | POA: Insufficient documentation

## 2021-11-16 NOTE — Therapy (Signed)
?OUTPATIENT PHYSICAL PROGRESS NOTE ? ?Progress Note ?Reporting Period 2/15 to 11/16/21 ? ? ?See note below for Objective Data and Assessment of Progress/Goals.  ? ? ? ? ? ?Patient Name: Melissa [name] ?MRN: [id number] ?DOB:1951-04-29, 71 y.o., female ?Today's Date: 11/16/2021 ? ? PT End of Session - 11/16/21 1204   ? ? Visit Number 3   ? Number of Visits 23   ? Date for PT Re-Evaluation 01/17/22   ? Authorization Type [employer] Medicare   ? PT Start Time 1145   ? PT Stop Time 1225   ? PT Time Calculation (min) 40 min   ? Activity Tolerance Patient tolerated treatment well   ? Behavior During Therapy [facility] for tasks assessed/performed   ? ?  ?  ? ?  ? ? ? ? ?Past Medical History:  ?Diagnosis Date  ? Allergy   ? Anxiety   ? Arthritis   ? s/p R TKR  ? Cyst of left kidney 2015  ? by lumbar MRI pending renal US  ? DDD (degenerative disc disease), lumbar 03/2014  ? severe L2-3 with L HNP with L2/3 nerve root impingement [name])  ? Depression with anxiety   ? Diabetes type 2, controlled ([place]) 2011  ? borderline  ? Glaucoma   ? History of ulcer disease   ? HLD (hyperlipidemia)   ? no meds taken  ? Hypertension   ? Plantar fasciitis of left foot 10/01/2013  ? PONV (postoperative nausea and vomiting)   ? Right-sided face pain 01/07/2019  ? Sleep apnea 2022  ? ?Past Surgical History:  ?Procedure Laterality Date  ? ABDOMINAL HYSTERECTOMY  2007  ? fibroids, heavy bleeding  ? ARTERY BIOPSY Right 01/23/2019  ? Procedure: REMOVAL OF PART OF RIGHT TEMPORAL ARTERY FOR BIOPSY;  Surgeon: Michael Boston, MD;  Location: [place];  Service: General;  Laterality: Right;  ? CATARACT EXTRACTION W/ INTRAOCULAR LENS IMPLANT Bilateral   ? COLONOSCOPY  04/2017  ? TA, diverticulosis, rpt ? ([facility])  ? GLAUCOMA SURGERY    ? HAMMER TOE SURGERY Bilateral   ? Right hip replacement    ? TONSILLECTOMY    ? TOTAL HIP ARTHROPLASTY Left 2021  ? TOTAL KNEE ARTHROPLASTY Right 2004  ? TKR (Dr. Eddie Dibbles with [facility])  ? TOTAL KNEE ARTHROPLASTY Left 07/11/2021  ?  Procedure: LEFT TOTAL KNEE ARTHROPLASTY;  Surgeon: Leandrew Koyanagi, MD;  Location: [place];  Service: Orthopedics;  Laterality: Left;  ? TOTAL KNEE REVISION Right 03/2014  ? Dr Al Corpus [phone]  ? TOTAL SHOULDER ARTHROPLASTY Right 2022  ? ?Patient Active Problem List  ? Diagnosis Date Noted  ? Elevated troponin 08/23/2021  ? Primary osteoarthritis of left knee 07/11/2021  ? Status post total left knee replacement 07/11/2021  ? DJD (degenerative joint disease) of knee 07/11/2021  ? Great toe pain, left 06/08/2021  ? Sleep apnea 05/24/2021  ? Low grade fever 01/17/2020  ? Frequent urination 10/14/2019  ? OAB (overactive bladder) 10/14/2019  ? Trigger middle finger of right hand 11/12/2018  ? Spondylosis of cervical region without myelopathy or radiculopathy 01/14/2018  ? Chronic left shoulder pain 01/08/2018  ? History of bilateral hip replacements 07/19/2017  ? Rotator cuff tear 02/02/2017  ? Acquired renal cyst of left kidney 01/01/2016  ? Osteoarthritis of spine with radiculopathy, 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  ? DDD (degenerative disc disease), lumbar 03/04/2014  ? Lumbar facet joint pain 02/24/2014  ? IBS (irritable bowel syndrome) 05/13/2013  ? Obesity 11/13/2012  ? Glaucoma   ?  Type 2 diabetes mellitus, controlled ([place])   ? Anxiety and depression   ? Vitreous degeneration, unspecified eye 10/25/2012  ? HTN (hypertension) 01/06/2012  ? GERD (gastroesophageal reflux disease) 01/06/2012  ? Hypercholesteremia 01/06/2012  ? ? ?PCP: Angelica Pou, MD ? ?REFERRING PROVIDER: Angelica Pou, MD ? ?REFERRING DIAG: G89.169 (ICD-10-CM) - Hx of total knee replacement, left; M25.511,G89.29 (ICD-10-CM) - Chronic right shoulder pain ? ?THERAPY DIAG:  ?Acute pain of left knee ? ?Stiffness of left knee, not elsewhere classified ? ?Muscle weakness (generalized) ? ?Localized edema ? ?Decreased right shoulder range of motion ? ?ONSET DATE: 07/11/2021 L TKA; 02/2021 R TSA ? ?SUBJECTIVE:  ? ?SUBJECTIVE STATEMENT: ?Pt states the knee is  just stiff. She has had some minor swelling. She states the shoulder motion is getting better. She is doing about 4x a week of exercise.  ? ?Eval: ?Pt had a TKA 07/2021.  Pt denies pain but does have stiffness especially over the past 2 weeks. She states that the keloid scar is what hurts. It feels very hurts. Pt states she has issues with stairs getting up and down.  ? ?PERTINENT HISTORY: ?DJD, DM2, Anxiety, depression, bilat THA ? ?PAIN:  ?Are you having pain? No ? ? ?PRECAUTIONS: None ? ? ?LIVING ENVIRONMENT: ?Lives with: lives alone ?Lives in: House/apartment ?Stairs: no ?Has following equipment at home: Single point cane ? ?OCCUPATION: working in group homes ? ?PLOF: Independent with basic ADLs ? ?PATIENT GOALS : Pt would like to get her arm back to normal and get her knee bending again. "I would like to loosen up." ? ? ?OBJECTIVE:  ? ?3/15 ? ?L knee flexion 118; Ext -2  ?MMT of L knee flexion 4+/5, ext 4+/5 ? ?  ?UE AROM: Flexion 110, ABD 100, IR SIJ BHB reach, extension 25 deg, ER C7 BHB reach ? ?5XSTS:  12.5s ? ?DIAGNOSTIC FINDINGS: IMPRESSION: ?Status post reverse total right shoulder arthroplasty without ?evidence of hardware failure. ? ? ?PATIENT SURVEYS:  ?FOTO 4 ?59 at D/C ?10 pts MCII ? ?46 on 3rd visit  ? ? ? ?TODAY'S TREATMENT: ? ?Manual: L knee flexion and TKE tibial ER grade III mob ? ?Exercises ?Seated Long Arc Quad  4lbs- 2 sets - 10 reps - 3 hold ?Seated Scapular Retraction - 2x20 reps ?Sit to Stand with Arms Crossed with YTB at knees - 2x 10 reps ?Supine AAROM flexion 15x ?Table flexion and ABD 15x each ?  RTB rows 2x10 ? ?PATIENT EDUCATION:  ?Education details: exam findings, deficits/strengths, anatomy, exercise progression, muscle firing,  envelope of function, HEP ? ?Person educated: Patient ?Education method: Explanation, Demonstration, Tactile cues, Verbal cues, and Handouts ?Education comprehension: verbalized understanding, returned demonstration, verbal cues required, and tactile cues  required ? ? ?HOME EXERCISE PROGRAM: ?Access Code: [email or web address] ?URL: [email or web address] ?Date: 10/19/2021 ?Prepared by: Daleen Bo ? ? ? ? ?ASSESSMENT: ? ?CLINICAL IMPRESSION: ?Pt with improvement in both objective measures of the L knee and R shoulder as well as self perceived functional measure. Pt with improved strength, gait, transfers, as well as OH reaching with the R UE. Pt continues to be limited by joint stiffness as well as strength but has made improvement with limited number of visits. Pt does appear to have pyschosocial factors affecting mood and motivation. Pt appears encouraged by recent progress.  Pt would benefit from continued skilled therapy in order to reach goals and maximize functional R UE and L knee strength and ROM for full return to work and normal ADL. ?  ? ? ?  OBJECTIVE IMPAIRMENTS Abnormal gait, decreased activity tolerance, decreased balance, decreased coordination, decreased endurance, decreased mobility, difficulty walking, decreased ROM, decreased strength, hypomobility, increased edema, increased fascial restrictions, increased muscle spasms, impaired flexibility, impaired UE functional use, improper body mechanics, postural dysfunction, and obesity.  ? ?ACTIVITY LIMITATIONS cleaning, community activity, driving, meal prep, occupation, laundry, yard work, shopping, and [employer] .  ? ?PERSONAL FACTORS Age, Behavior pattern, Fitness, Past/current experiences, Time since onset of injury/illness/exacerbation, and 1-2 comorbidities:    are also affecting patient's functional outcome.  ? ? ?REHAB POTENTIAL: Good ? ?CLINICAL DECISION MAKING: Evolving/moderate complexity ? ?EVALUATION COMPLEXITY: Moderate ? ? ?GOALS: ? ? ?SHORT TERM GOALS: ? ?STG Name Target Date Goal status  ?1 Pt will become independent with HEP in order to demonstrate synthesis of PT education. ? ?Baseline:  12/28/2021 INITIAL  ?2 Pt will be able to demonstrate/report ability to walk >10 mins  without pain in order to demonstrate functional improvement and tolerance to exercise and community mobility. ? ?Baseline:  12/28/2021 INITIAL  ?3 Pt will be able to demonstrate BHB reach without pain  in order t

## 2021-11-23 ENCOUNTER — Encounter (HOSPITAL_BASED_OUTPATIENT_CLINIC_OR_DEPARTMENT_OTHER): Payer: Self-pay | Admitting: Physical Therapy

## 2021-11-23 ENCOUNTER — Ambulatory Visit (HOSPITAL_BASED_OUTPATIENT_CLINIC_OR_DEPARTMENT_OTHER): Payer: Medicare Other | Admitting: Physical Therapy

## 2021-11-23 ENCOUNTER — Other Ambulatory Visit: Payer: Self-pay

## 2021-11-23 DIAGNOSIS — M25611 Stiffness of right shoulder, not elsewhere classified: Secondary | ICD-10-CM

## 2021-11-23 DIAGNOSIS — M6281 Muscle weakness (generalized): Secondary | ICD-10-CM

## 2021-11-23 DIAGNOSIS — M25662 Stiffness of left knee, not elsewhere classified: Secondary | ICD-10-CM

## 2021-11-23 DIAGNOSIS — R600 Localized edema: Secondary | ICD-10-CM | POA: Diagnosis not present

## 2021-11-23 DIAGNOSIS — M25562 Pain in left knee: Secondary | ICD-10-CM | POA: Diagnosis not present

## 2021-11-23 NOTE — Therapy (Signed)
?OUTPATIENT PHYSICAL PROGRESS NOTE ? ?Progress Note ?Reporting Period 2/15 to 11/23/21 ? ? ?See note below for Objective Data and Assessment of Progress/Goals.  ? ? ? ? ? ?Patient Name: Melissa [name] ?MRN: [id number] ?DOB:1951/05/25, 71 y.o., female ?Today's Date: 11/23/2021 ? ? PT End of Session - 11/23/21 1347   ? ? Visit Number 4   ? Number of Visits 23   ? Date for PT Re-Evaluation 01/17/22   ? Authorization Type [employer] Medicare   ? PT Start Time 1302   ? PT Stop Time 0623   ? PT Time Calculation (min) 41 min   ? Activity Tolerance Patient tolerated treatment well   ? Behavior During Therapy [facility] for tasks assessed/performed   ? ?  ?  ? ?  ? ? ? ? ? ?Past Medical History:  ?Diagnosis Date  ? Allergy   ? Anxiety   ? Arthritis   ? s/p R TKR  ? Cyst of left kidney 2015  ? by lumbar MRI pending renal US  ? DDD (degenerative disc disease), lumbar 03/2014  ? severe L2-3 with L HNP with L2/3 nerve root impingement [name])  ? Depression with anxiety   ? Diabetes type 2, controlled ([place]) 2011  ? borderline  ? Glaucoma   ? History of ulcer disease   ? HLD (hyperlipidemia)   ? no meds taken  ? Hypertension   ? Plantar fasciitis of left foot 10/01/2013  ? PONV (postoperative nausea and vomiting)   ? Right-sided face pain 01/07/2019  ? Sleep apnea 2022  ? ?Past Surgical History:  ?Procedure Laterality Date  ? ABDOMINAL HYSTERECTOMY  2007  ? fibroids, heavy bleeding  ? ARTERY BIOPSY Right 01/23/2019  ? Procedure: REMOVAL OF PART OF RIGHT TEMPORAL ARTERY FOR BIOPSY;  Surgeon: Michael Boston, MD;  Location: [place];  Service: General;  Laterality: Right;  ? CATARACT EXTRACTION W/ INTRAOCULAR LENS IMPLANT Bilateral   ? COLONOSCOPY  04/2017  ? TA, diverticulosis, rpt ? ([facility])  ? GLAUCOMA SURGERY    ? HAMMER TOE SURGERY Bilateral   ? Right hip replacement    ? TONSILLECTOMY    ? TOTAL HIP ARTHROPLASTY Left 2021  ? TOTAL KNEE ARTHROPLASTY Right 2004  ? TKR (Dr. Eddie Dibbles with [facility])  ? TOTAL KNEE ARTHROPLASTY Left 07/11/2021  ?  Procedure: LEFT TOTAL KNEE ARTHROPLASTY;  Surgeon: Leandrew Koyanagi, MD;  Location: [place];  Service: Orthopedics;  Laterality: Left;  ? TOTAL KNEE REVISION Right 03/2014  ? Dr Al Corpus [phone]  ? TOTAL SHOULDER ARTHROPLASTY Right 2022  ? ?Patient Active Problem List  ? Diagnosis Date Noted  ? Elevated troponin 08/23/2021  ? Primary osteoarthritis of left knee 07/11/2021  ? Status post total left knee replacement 07/11/2021  ? DJD (degenerative joint disease) of knee 07/11/2021  ? Great toe pain, left 06/08/2021  ? Sleep apnea 05/24/2021  ? Low grade fever 01/17/2020  ? Frequent urination 10/14/2019  ? OAB (overactive bladder) 10/14/2019  ? Trigger middle finger of right hand 11/12/2018  ? Spondylosis of cervical region without myelopathy or radiculopathy 01/14/2018  ? Chronic left shoulder pain 01/08/2018  ? History of bilateral hip replacements 07/19/2017  ? Rotator cuff tear 02/02/2017  ? Acquired renal cyst of left kidney 01/01/2016  ? Osteoarthritis of spine with radiculopathy, 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  ? DDD (degenerative disc disease), lumbar 03/04/2014  ? Lumbar facet joint pain 02/24/2014  ? IBS (irritable bowel syndrome) 05/13/2013  ? Obesity 11/13/2012  ?  Glaucoma   ? Type 2 diabetes mellitus, controlled ([place])   ? Anxiety and depression   ? Vitreous degeneration, unspecified eye 10/25/2012  ? HTN (hypertension) 01/06/2012  ? GERD (gastroesophageal reflux disease) 01/06/2012  ? Hypercholesteremia 01/06/2012  ? ? ?PCP: Angelica Pou, MD ? ?REFERRING PROVIDER: Angelica Pou, MD ? ?REFERRING DIAG: B51.025 (ICD-10-CM) - Hx of total knee replacement, left; M25.511,G89.29 (ICD-10-CM) - Chronic right shoulder pain ? ?THERAPY DIAG:  ?Acute pain of left knee ? ?Stiffness of left knee, not elsewhere classified ? ?Muscle weakness (generalized) ? ?Localized edema ? ?Decreased right shoulder range of motion ? ?ONSET DATE: 07/11/2021 L TKA; 02/2021 R TSA ? ?SUBJECTIVE:  ? ?SUBJECTIVE STATEMENT: ?Pt states the knee feels  a little better and the shoulder doesn't lock anymore.  ? ?Eval: ?Pt had a TKA 07/2021.  Pt denies pain but does have stiffness especially over the past 2 weeks. She states that the keloid scar is what hurts. It feels very hurts. Pt states she has issues with stairs getting up and down.  ? ?PERTINENT HISTORY: ?DJD, DM2, Anxiety, depression, bilat THA ? ?PAIN:  ?Are you having pain? No ? ? ?PRECAUTIONS: None ? ? ?LIVING ENVIRONMENT: ?Lives with: lives alone ?Lives in: House/apartment ?Stairs: no ?Has following equipment at home: Single point cane ? ?OCCUPATION: working in group homes ? ?PLOF: Independent with basic ADLs ? ?PATIENT GOALS : Pt would like to get her arm back to normal and get her knee bending again. "I would like to loosen up." ? ? ?OBJECTIVE:  ? ?3/15 ? ?L knee flexion 118; Ext -2  ?MMT of L knee flexion 4+/5, ext 4+/5 ? ?  ?UE AROM: Flexion 110, ABD 100, IR SIJ BHB reach, extension 25 deg, ER C7 BHB reach ? ?5XSTS:  12.5s ? ?DIAGNOSTIC FINDINGS: IMPRESSION: ?Status post reverse total right shoulder arthroplasty without ?evidence of hardware failure. ? ? ?PATIENT SURVEYS:  ?FOTO 40 ?59 at D/C ?10 pts MCII ? ?39 on 3rd visit  ? ? ? ?TODAY'S TREATMENT: ? ?Manual: L knee flexion and TKE tibial ER grade III mob ? ?Exercises ?Supine SLR 2x10 ?Bridge 3x10 ?Sit to Stand with Arms Crossed with RTB at knees - 2x 10 reps ? ?Supine AAROM shoulder flexion 3s 10x ?Pulley's R flexion and ABD 2 mins each ?GTB rows 2x10 ? ?PATIENT EDUCATION:  ?Education details: anatomy, exercise progression, muscle firing,  envelope of function, HEP ? ?Person educated: Patient ?Education method: Explanation, Demonstration, Tactile cues, Verbal cues, and Handouts ?Education comprehension: verbalized understanding, returned demonstration, verbal cues required, and tactile cues required ? ? ?HOME EXERCISE PROGRAM: ?Access Code: [email or web address] ?URL: [email or web address] ?Date: 10/19/2021 ?Prepared by: Daleen Bo ? ? ?ASSESSMENT: ? ?CLINICAL IMPRESSION: ?Pt able to progress exercise at today's session with increased repetitions as well as ROM for L knee and R shoulder. Pt did not have increased pain or irritation at the shoulder or knee. Pt does require VC for hip/knee alignment during STS bridging as she has tendency to fall into genu valgus. Pt continues to make progress with R shoulder ROM though is limited by history L shoulder RC injury.  Plan to continue with L knee and R shoulder strength and ROM. Pt would benefit from continued skilled therapy in order to reach goals and maximize functional R UE and L knee strength and ROM for full return to work and normal ADL. ?  ? ? ?OBJECTIVE IMPAIRMENTS Abnormal gait, decreased activity tolerance, decreased balance, decreased coordination, decreased endurance, decreased mobility, difficulty  walking, decreased ROM, decreased strength, hypomobility, increased edema, increased fascial restrictions, increased muscle spasms, impaired flexibility, impaired UE functional use, improper body mechanics, postural dysfunction, and obesity.  ? ?ACTIVITY LIMITATIONS cleaning, community activity, driving, meal prep, occupation, laundry, yard work, shopping, and [employer] .  ? ?PERSONAL FACTORS Age, Behavior pattern, Fitness, Past/current experiences, Time since onset of injury/illness/exacerbation, and 1-2 comorbidities:    are also affecting patient's functional outcome.  ? ? ?REHAB POTENTIAL: Good ? ?CLINICAL DECISION MAKING: Evolving/moderate complexity ? ?EVALUATION COMPLEXITY: Moderate ? ? ?GOALS: ? ? ?SHORT TERM GOALS: ? ?STG Name Target Date Goal status  ?1 Pt will become independent with HEP in order to demonstrate synthesis of PT education. ? ?Baseline:  01/04/2022 ongoing  ?2 Pt will be able to demonstrate/report ability to walk >10 mins without pain in order to demonstrate functional improvement and tolerance to exercise and community mobility. ? ?Baseline:  01/04/2022  ongoing  ?3 Pt will be able to demonstrate BHB reach without pain  in order to demonstrate functional improvement in UE/LE function for self-care and house hold duties. ? ? ?Baseline: 01/04/2022 Partially met  ? ?LONG TERM GOAL

## 2021-11-24 ENCOUNTER — Other Ambulatory Visit: Payer: Self-pay | Admitting: Internal Medicine

## 2021-11-24 DIAGNOSIS — Z1231 Encounter for screening mammogram for malignant neoplasm of breast: Secondary | ICD-10-CM

## 2021-11-30 ENCOUNTER — Encounter (HOSPITAL_BASED_OUTPATIENT_CLINIC_OR_DEPARTMENT_OTHER): Payer: Medicare Other | Admitting: Physical Therapy

## 2021-11-30 ENCOUNTER — Encounter (HOSPITAL_BASED_OUTPATIENT_CLINIC_OR_DEPARTMENT_OTHER): Payer: Self-pay

## 2021-12-01 ENCOUNTER — Ambulatory Visit (INDEPENDENT_AMBULATORY_CARE_PROVIDER_SITE_OTHER): Payer: Medicare Other | Admitting: Internal Medicine

## 2021-12-01 DIAGNOSIS — Z0000 Encounter for general adult medical examination without abnormal findings: Secondary | ICD-10-CM

## 2021-12-01 NOTE — Patient Instructions (Signed)

## 2021-12-01 NOTE — Progress Notes (Signed)
? ?Subjective:  ? Melissa [name] is a 71 y.o. female who presents for an Initial Medicare Annual Wellness Visit. ?I connected with  Richie F Chiaramonte on 12/01/21 by a audio enabled telemedicine application and verified that I am speaking with the correct person using two identifiers. ? ?Patient Location: Home ? ?Provider Location: Office/Clinic ? ?I discussed the limitations of evaluation and management by telemedicine. The patient expressed understanding and agreed to proceed.  ?Review of Systems    ?Defer to PCP.  ?  ? ?   ?Objective:  ?  ?There were no vitals filed for this visit. ?There is no height or weight on file to calculate BMI. ? ? ?  10/19/2021  ?  9:38 AM 07/26/2021  ?  1:45 PM 07/11/2021  ?  7:58 AM 07/07/2021  ? 11:30 AM 06/08/2021  ?  3:36 PM 05/11/2021  ? 10:24 AM 02/27/2021  ? 12:48 PM  ?Advanced Directives  ?Does Patient Have a Medical Advance Directive? _0  No No  ?Would patient like information on creating a medical advance directive? No - Patient declined No - Patient declined No - Patient declined No - Patient declined No - Patient declined No - Patient declined   ? ? ?Current Medications (verified) ?Outpatient Encounter Medications as of 12/01/2021  ?Medication Sig  ? methocarbamol (ROBAXIN) 500 MG tablet Take 1 tablet (500 mg total) by mouth 2 (two) times daily as needed.  ? Accu-Chek FastClix Lancets MISC USE TO TEST BLOOD SUGAR UP TO FOUR TIMES DAILY  ? ACCU-CHEK GUIDE test strip USE AS DIRECTED FOUR TIMES DAILY  ? ALPRAZolam (XANAX) 0.25 MG tablet TAKE 1 TABLET(0.25 MG) BY MOUTH AT BEDTIME AS NEEDED FOR SLEEP  ? aspirin EC 81 MG tablet Take 1 tablet (81 mg total) by mouth 2 (two) times daily. To be taken after surgery  ? blood glucose meter kit and supplies KIT Dispense based on patient and insurance preference. Use up to four times daily as directed. DX Code: E11.9  ? Crisaborole (EUCRISA) 2 % OINT Apply 1 application topically daily as needed (Itchy skin).  ? cycloSPORINE  (RESTASIS) 0.05 % ophthalmic emulsion Place 1 drop into both eyes 2 (two) times daily as needed (dry eyes).  ? desonide (DESOWEN) 0.05 % ointment Apply 1 application topically 2 (two) times daily as needed.  ? glipiZIDE (GLUCOTROL XL) 2.5 MG 24 hr tablet Take 2.5 mg by mouth daily with breakfast.  ? hydrochlorothiazide (HYDRODIURIL) 25 MG tablet Take 1 tablet (25 mg total) by mouth daily. (Patient taking differently: Take 25 mg by mouth daily as needed (swelling).)  ? hydroquinone 4 % cream Apply to darker areas of skin once a day as needed  ? levocetirizine (XYZAL) 5 MG tablet Take 1 tablet (5 mg total) by mouth every evening.  ? lisinopril (ZESTRIL) 20 MG tablet Take 1 tablet (20 mg total) by mouth daily.  ? MULTIPLE VITAMIN PO Take 1 tablet by mouth daily.  ? naproxen (NAPROSYN) 500 MG tablet Take 1 tablet (500 mg total) by mouth 2 (two) times daily with a meal.  ? omeprazole (PRILOSEC) 20 MG capsule Take 20 mg by mouth daily as needed (heartburn).  ? TRULICITY 1.5 EX/5.1ZG SOPN Inject 1.5 mg into the skin every Friday.  ? VYZULTA 0.024 % SOLN Place 1 drop into both eyes at bedtime.  ? ?No facility-administered encounter medications on file as of 12/01/2021.  ? ? ?Allergies (verified) ?Codeine, Metformin and related, Other, and Oxycodone  ? ?  History: ?Past Medical History:  ?Diagnosis Date  ? Allergy   ? Anxiety   ? Arthritis   ? s/p R TKR  ? Cyst of left kidney 2015  ? by lumbar MRI pending renal US  ? DDD (degenerative disc disease), lumbar 03/2014  ? severe L2-3 with L HNP with L2/3 nerve root impingement (Gu[facility] @ [facility])  ? Depression with anxiety   ? Diabetes type 2, controlled ([facility]) 2011  ? borderline  ? Glaucoma   ? History of ulcer disease   ? HLD (hyperlipidemia)   ? no meds taken  ? Hypertension   ? Plantar fasciitis of left foot 10/01/2013  ? PONV (postoperative nausea and vomiting)   ? Right-sided face pain 01/07/2019  ? Sleep apnea 2022  ? ?Past Surgical History:  ?Procedure Laterality Date  ? ABDOMINAL  HYSTERECTOMY  2007  ? fibroids, heavy bleeding  ? ARTERY BIOPSY Right 01/23/2019  ? Procedure: REMOVAL OF PART OF RIGHT TEMPORAL ARTERY FOR BIOPSY;  Surgeon: Gross, Steven, MD;  Location: [facility];  Service: General;  Laterality: Right;  ? CATARACT EXTRACTION W/ INTRAOCULAR LENS IMPLANT Bilateral   ? COLONOSCOPY  04/2017  ? TA, diverticulosis, rpt ? ([facility])  ? GLAUCOMA SURGERY    ? HAMMER TOE SURGERY Bilateral   ? Right hip replacement    ? TONSILLECTOMY    ? TOTAL HIP ARTHROPLASTY Left 2021  ? TOTAL KNEE ARTHROPLASTY Right 2004  ? TKR (Dr. Paul with [facility])  ? TOTAL KNEE ARTHROPLASTY Left 07/11/2021  ? Procedure: LEFT TOTAL KNEE ARTHROPLASTY;  Surgeon: Xu, Naiping M, MD;  Location: [facility];  Service: Orthopedics;  Laterality: Left;  ? TOTAL KNEE REVISION Right 03/2014  ? Dr Lang [facility]  ? TOTAL SHOULDER ARTHROPLASTY Right 2022  ? ?Family History  ?Problem Relation Age of Onset  ? Cancer Maternal Grandmother 69  ?     ovarian  ? Diabetes Maternal Grandmother   ? Hypertension Maternal Grandmother   ? Cancer Mother 47  ?     bone, MM  ? Stroke Other   ?     unsure who  ? CAD Neg Hx   ? Colon cancer Neg Hx   ? Esophageal cancer Neg Hx   ? Rectal cancer Neg Hx   ? Stomach cancer Neg Hx   ? ?Social History  ? ?Socioeconomic History  ? Marital status: Single  ?  Spouse name: Not on file  ? Number of children: 0  ? Years of education: 16  ? Highest education level: Not on file  ?Occupational History  ? Occupation: work with disabilities/ Retired  ?  Employer: [employer]  ?Tobacco Use  ? Smoking status: Never  ? Smokeless tobacco: Never  ?Vaping Use  ? Vaping Use: Never used  ?Substance and Sexual Activity  ? Alcohol use: No  ? Drug use: No  ? Sexual activity: Not Currently  ?  Birth control/protection: Surgical  ?Other Topics Concern  ? Not on file  ?Social History Narrative  ? Caffeine: rare  ? Lives alone  ? 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[employer]  ? Fun/Hobbies: Gardening   ? ?Social Determinants of  Health  ? ?Financial Resource Strain: Not on file  ?Food Insecurity: Not on file  ?Transportation Needs: Not on file  ?Physical Activity: Not on file  ?Stress: Not on file  ?Social Connections: Not on file  ? ? ?Tobacco Counseling ?Counseling given: Not Answered ? ? ?Clinical Intake: ? ?  ? ?  ? ?  ? ?  ? ?  ? ?  Diabetic?yes  ? ? ?  ? ?  ? ? ?Activities of Daily Living ? ?  07/11/2021  ?  6:49 PM 07/11/2021  ?  6:44 PM  ?In your present state of health, do you have any difficulty performing the following activities:  ?Hearing? 0 0  ?Vision? 0 0  ?Difficulty concentrating or making decisions? 0 0  ?Walking or climbing stairs? 1 1  ?Dressing or bathing? 1 1  ?Doing errands, shopping? 0   ? ? ?Patient Care Team: ?Angelica Pou, MD as PCP - General (Internal Medicine) ?Minus Breeding, MD as PCP - Cardiology (Cardiology) ?Melida Quitter, MD as Consulting Physician (Otolaryngology) ?Avie Echevaria., MD as Consulting Physician (Sports Medicine) ? ?Indicate any recent Medical Services you may have received from other than [facility] providers in the past year (date may be approximate). ? ?   ?Assessment:  ? This is a routine wellness examination for Lavaya. ? ?Hearing/Vision screen ?No results found. ? ?Dietary issues and exercise activities discussed: ?  ? ? Goals Addressed   ?None ?  ?Depression Screen ? ?  06/08/2021  ?  4:23 PM 05/11/2021  ? 10:25 AM 01/16/2020  ?  1:36 PM 07/14/2019  ? 12:31 PM 01/07/2019  ?  2:20 PM 05/15/2018  ?  9:40 AM 05/30/2017  ? 11:20 AM  ?PHQ 2/9 Scores  ?PHQ - 2 Score _0 0  ?PHQ- 9 Score _1 ?  ?Fall Risk ? ?  06/08/2021  ?  3:35 PM 05/11/2021  ? 10:24 AM 01/16/2020  ?  1:36 PM 08/27/2019  ?  1:47 PM 07/14/2019  ? 12:31 PM  ?Fall Risk   ?Falls in the past year? 0 0 0 0 0  ?Number falls in past yr: 0 0 0 0 0  ?Injury with Fall? 0 0 0 0 0  ?Risk for fall due to : Other (Comment)  Impaired balance/gait  Impaired mobility  ?Risk for fall due to: Comment knee pain and toe      ?Follow up  Falls evaluation completed;Falls prevention discussed Falls evaluation completed Falls evaluation completed  Falls prevention discussed  ? ? ?FALL RISK PREVENTION PERTAINING TO THE HOME: ? ?Any stairs in or arou

## 2021-12-07 ENCOUNTER — Ambulatory Visit (HOSPITAL_BASED_OUTPATIENT_CLINIC_OR_DEPARTMENT_OTHER): Payer: Medicare Other | Attending: Orthopaedic Surgery | Admitting: Physical Therapy

## 2021-12-07 ENCOUNTER — Encounter (HOSPITAL_BASED_OUTPATIENT_CLINIC_OR_DEPARTMENT_OTHER): Payer: Self-pay | Admitting: Physical Therapy

## 2021-12-07 DIAGNOSIS — M25611 Stiffness of right shoulder, not elsewhere classified: Secondary | ICD-10-CM | POA: Insufficient documentation

## 2021-12-07 DIAGNOSIS — M6281 Muscle weakness (generalized): Secondary | ICD-10-CM | POA: Insufficient documentation

## 2021-12-07 DIAGNOSIS — R600 Localized edema: Secondary | ICD-10-CM | POA: Insufficient documentation

## 2021-12-07 DIAGNOSIS — M25562 Pain in left knee: Secondary | ICD-10-CM | POA: Insufficient documentation

## 2021-12-07 DIAGNOSIS — M25662 Stiffness of left knee, not elsewhere classified: Secondary | ICD-10-CM | POA: Diagnosis not present

## 2021-12-07 NOTE — Therapy (Addendum)
OUTPATIENT PHYSICAL TREATMENT NOTE  PHYSICAL THERAPY DISCHARGE SUMMARY  Visits from Start of Care: 5  Plan: Patient agrees to discharge.  Patient goals were not met. Patient is being discharged due to not returning.         Patient Name: Melissa [name] MRN: [id number] DOB:1951-06-20, 71 y.o., female Today's Date: 12/07/2021   PT End of Session - 12/07/21 1453     Visit Number 5    Number of Visits 23    Date for PT Re-Evaluation 01/17/22    Authorization Type [employer] Medicare    PT Start Time 1430    PT Stop Time 1500    PT Time Calculation (min) 30 min    Activity Tolerance Patient tolerated treatment well    Behavior During Therapy WFL for tasks assessed/performed                 Past Medical History:  Diagnosis Date   Allergy    Anxiety    Arthritis    s/p R TKR   Cyst of left kidney 2015   by lumbar MRI pending renal US   DDD (degenerative disc disease), lumbar 03/2014   severe L2-3 with L HNP with L2/3 nerve root impingement [name])   Depression with anxiety    Diabetes type 2, controlled ([place]) 2011   borderline   Glaucoma    History of ulcer disease    HLD (hyperlipidemia)    no meds taken   Hypertension    Plantar fasciitis of left foot 10/01/2013   PONV (postoperative nausea and vomiting)    Right-sided face pain 01/07/2019   Sleep apnea 2022   Past Surgical History:  Procedure Laterality Date   ABDOMINAL HYSTERECTOMY  2007   fibroids, heavy bleeding   ARTERY BIOPSY Right 01/23/2019   Procedure: REMOVAL OF PART OF RIGHT TEMPORAL ARTERY FOR BIOPSY;  Surgeon: Michael Boston, MD;  Location: [facility];  Service: General;  Laterality: Right;   CATARACT EXTRACTION W/ INTRAOCULAR LENS IMPLANT Bilateral    COLONOSCOPY  04/2017   TA, diverticulosis, rpt ? ([facility])   GLAUCOMA SURGERY     HAMMER TOE SURGERY Bilateral    Right hip replacement     TONSILLECTOMY     TOTAL HIP ARTHROPLASTY Left 2021   TOTAL KNEE ARTHROPLASTY Right 2004   TKR (Dr. Eddie Dibbles  with [facility])   TOTAL KNEE ARTHROPLASTY Left 07/11/2021   Procedure: LEFT TOTAL KNEE ARTHROPLASTY;  Surgeon: Leandrew Koyanagi, MD;  Location: [facility];  Service: Orthopedics;  Laterality: Left;   TOTAL KNEE REVISION Right 03/2014   Dr Al [place] Right 2022   Patient Active Problem List   Diagnosis Date Noted   Elevated troponin 08/23/2021   Primary osteoarthritis of left knee 07/11/2021   Status post total left knee replacement 07/11/2021   DJD (degenerative joint disease) of knee 07/11/2021   Great toe pain, left 06/08/2021   Sleep apnea 05/24/2021   Low grade fever 01/17/2020   Frequent urination 10/14/2019   OAB (overactive bladder) 10/14/2019   Trigger middle finger of right hand 11/12/2018   Spondylosis of cervical region without myelopathy or radiculopathy 01/14/2018   Chronic left shoulder pain 01/08/2018   History of bilateral hip replacements 07/19/2017   Rotator cuff tear 02/02/2017   Acquired renal cyst of left kidney 01/01/2016   Osteoarthritis of spine with radiculopathy, 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   DDD (degenerative disc disease), lumbar 03/04/2014   Lumbar facet joint  pain 02/24/2014   IBS (irritable bowel syndrome) 05/13/2013   Obesity 11/13/2012   Glaucoma    Type 2 diabetes mellitus, controlled ([place])    Anxiety and depression    Vitreous degeneration, unspecified eye 10/25/2012   HTN (hypertension) 01/06/2012   GERD (gastroesophageal reflux disease) 01/06/2012   Hypercholesteremia 01/06/2012    PCP: Angelica Pou, MD  REFERRING PROVIDER: Angelica Pou, MD  REFERRING DIAG: [phone] (ICD-10-CM) - Hx of total knee replacement, left; M25.511,G89.29 (ICD-10-CM) - Chronic right shoulder pain  THERAPY DIAG:  Acute pain of left knee  Stiffness of left knee, not elsewhere classified  Muscle weakness (generalized)  Localized edema  Decreased right shoulder range of motion  ONSET DATE: 07/11/2021 L TKA; 02/2021 R  TSA  SUBJECTIVE:   SUBJECTIVE STATEMENT: Pt states the the shoulder is still continuing to move better. She states the knee is better but just feels weak. She feels things are better overall though.   She may need to make to today the last appt due to "lots of other things going on in my life." Pt agrees to leaving case open for 30 days in case she is able to return.   Eval: Pt had a TKA 07/2021.  Pt denies pain but does have stiffness especially over the past 2 weeks. She states that the keloid scar is what hurts. It feels very hurts. Pt states she has issues with stairs getting up and down.   PERTINENT HISTORY: DJD, DM2, Anxiety, depression, bilat THA  PAIN:  Are you having pain? No   PRECAUTIONS: None   LIVING ENVIRONMENT: Lives with: lives alone Lives in: House/apartment Stairs: no Has following equipment at home: Single point cane  OCCUPATION: working in group homes  PLOF: Independent with basic ADLs  PATIENT GOALS : Pt would like to get her arm back to normal and get her knee bending again. "I would like to loosen up."   OBJECTIVE:   DIAGNOSTIC FINDINGS: IMPRESSION: Status post reverse total right shoulder arthroplasty without evidence of hardware failure.   PATIENT SURVEYS:  FOTO 49 59 at D/C 10 pts [facility]  62 on 3rd visit     TODAY'S TREATMENT:  Exercises - Seated Shoulder Flexion Slide at Table Top with Forearm in Neutral  - 2 x daily - 3-4 x weekly - 1 sets - 10 reps - 5 hold - Standing Shoulder Row with Anchored Resistance  - 1 x daily - 3-4 x weekly - 3 sets - 10 reps - Seated Shoulder Abduction Towel Slide at Table Top with Forearm in Neutral  - 1 x daily - 3-4 x weekly - 2 sets - 10 reps - 5 hold - Sit to Stand with Arms Crossed  - 1 x daily - 3-4 x weekly - 2 sets - 10 reps - Standing Shoulder Abduction AAROM with Dowel  - 1 x daily - 3-4 x weekly - 2 sets - 10 reps - Shoulder Flexion Overhead with Dowel  - 1 x daily - 3-4 x weekly - 2 sets - 10  reps - Sitting Knee Extension with Resistance  - 1 x daily - 3-4 x weekly - 3 sets - 10 reps - Supine Active Straight Leg Raise  - 1 x daily - 3-4 x weekly - 3 sets - 10 reps - step up 2x10  Patient Education - walking program   PATIENT EDUCATION:  Education details: anatomy, exercise progression, muscle firing,  envelope of function, HEP  Person educated: Patient Education method:  Explanation, Demonstration, Tactile cues, Verbal cues, and Handouts Education comprehension: verbalized understanding, returned demonstration, verbal cues required, and tactile cues required   HOME EXERCISE PROGRAM: Access Code: [email or web address] URL: [email or web address] Date: 10/19/2021 Prepared by: Daleen Bo   ASSESSMENT:  CLINICAL IMPRESSION:  Due to pt's request of potential last visit, session was focused on reviewing HEP and providing edu about self progression of exercise. Pt was advised on her current ROM and strength deficits and need for continuing to work on consistent exercise in order to improve functional mobility and strength. Pt gave verbal understanding to HEP review/technique cuing. Pt was provided a walking program as well in order to promote improvement in community ambulation. Pt to see MD in May in regard to her L RC and potential surgery. Pt agreeable to leaving her current episode open for 30 days in the event she is able to return and continue with therapy.  Pt would benefit from continued skilled therapy in order to reach goals and maximize functional R UE and L knee strength and ROM for full return to work and normal ADL.     OBJECTIVE IMPAIRMENTS Abnormal gait, decreased activity tolerance, decreased balance, decreased coordination, decreased endurance, decreased mobility, difficulty walking, decreased ROM, decreased strength, hypomobility, increased edema, increased fascial restrictions, increased muscle spasms, impaired flexibility, impaired UE functional use, improper  body mechanics, postural dysfunction, and obesity.   ACTIVITY LIMITATIONS cleaning, community activity, driving, meal prep, occupation, laundry, yard work, shopping, and Naval architect .   PERSONAL FACTORS Age, Behavior pattern, Fitness, Past/current experiences, Time since onset of injury/illness/exacerbation, and 1-2 comorbidities:    are also affecting patient's functional outcome.    REHAB POTENTIAL: Good  CLINICAL DECISION MAKING: Evolving/moderate complexity  EVALUATION COMPLEXITY: Moderate   GOALS:   SHORT TERM GOALS:  STG Name Target Date Goal status  1 Pt will become independent with HEP in order to demonstrate synthesis of PT education.  Baseline:  01/18/2022 ongoing  2 Pt will be able to demonstrate/report ability to walk >10 mins without pain in order to demonstrate functional improvement and tolerance to exercise and community mobility.  Baseline:  01/18/2022 ongoing  3 Pt will be able to demonstrate BHB reach without pain  in order to demonstrate functional improvement in UE/LE function for self-care and house hold duties.   Baseline: 01/18/2022 Partially met   LONG TERM GOALS:   LTG Name Target Date Goal status  1 Pt  will become independent with final HEP in order to demonstrate synthesis of PT education.  Baseline: 03/01/2022 ongoing  2 Pt will be able to reach [facility] and carry/hold >3 lbs in order to demonstrate functional improvement in L UE strength for return to PLOF.  Baseline: 03/01/2022 ongoing  3 Pt will score >/= 59 on FOTO to demonstrate improvement in perceived knee and shoulder function.  03/01/2022 ongoing  4 Pt will decrease 5XSTS by at least 3 seconds in order to demonstrate clinically significant improvement in LE strength  Baseline: 03/01/2022 ongoing   PLAN: PT FREQUENCY: 1-2x/week  PT DURATION: 12 weeks (likely D/C by 8 weeks)  PLANNED INTERVENTIONS: Therapeutic exercises, Therapeutic activity, Neuro Muscular re-education, Balance  training, Gait training, Patient/Family education, Joint mobilization, Stair training, Orthotic/Fit training, DME instructions, Aquatic Therapy, Dry Needling, Electrical stimulation, Spinal mobilization, Cryotherapy, Moist heat, scar mobilization, Taping, Vasopneumatic device, Traction, Ultrasound, Ionotophoresis 31m/ml Dexamethasone, and Manual therapy  PLAN FOR NEXT SESSION: review HEP, progress strengthening as tolerated  ADaleen Bo PT 12/07/2021, 3:13 PM

## 2021-12-11 DIAGNOSIS — G4733 Obstructive sleep apnea (adult) (pediatric): Secondary | ICD-10-CM | POA: Diagnosis not present

## 2021-12-20 ENCOUNTER — Ambulatory Visit
Admission: RE | Admit: 2021-12-20 | Discharge: 2021-12-20 | Disposition: A | Discharge status: Home / Self Care | Payer: Medicare Other | Source: Ambulatory Visit | Attending: Internal Medicine | Admitting: Internal Medicine

## 2021-12-20 ENCOUNTER — Other Ambulatory Visit: Payer: Self-pay | Admitting: Internal Medicine

## 2021-12-20 DIAGNOSIS — Z1231 Encounter for screening mammogram for malignant neoplasm of breast: Secondary | ICD-10-CM

## 2021-12-20 DIAGNOSIS — F419 Anxiety disorder, unspecified: Secondary | ICD-10-CM

## 2021-12-20 NOTE — Telephone Encounter (Signed)
Pt stating she has been calling her pharmacy x 1 week and no medication has been refilled. ? ?ALPRAZolam (XANAX) 0.25 MG tablet ? ?[facility] [place], [place] (Ph: [phone]) ?

## 2021-12-21 MED ORDER — ALPRAZOLAM 0.25 MG PO TABS: ORAL_TABLET | ORAL | 5 refills | Status: DC

## 2021-12-21 NOTE — Addendum Note (Signed)
Addended by: Velora Heckler on: 12/21/2021 11:32 AM ? ? Modules accepted: Orders ? ?

## 2021-12-22 ENCOUNTER — Encounter: Payer: Self-pay | Admitting: Allergy

## 2021-12-22 ENCOUNTER — Ambulatory Visit (INDEPENDENT_AMBULATORY_CARE_PROVIDER_SITE_OTHER): Payer: Medicare Other | Admitting: Allergy

## 2021-12-22 VITALS — BP 122/82 | HR 81 | Temp 98.4°F | Resp 18 | Ht 65.0 in | Wt 203.0 lb

## 2021-12-22 DIAGNOSIS — H1013 Acute atopic conjunctivitis, bilateral: Secondary | ICD-10-CM | POA: Diagnosis not present

## 2021-12-22 DIAGNOSIS — J3089 Other allergic rhinitis: Secondary | ICD-10-CM | POA: Diagnosis not present

## 2021-12-22 DIAGNOSIS — L231 Allergic contact dermatitis due to adhesives: Secondary | ICD-10-CM | POA: Diagnosis not present

## 2021-12-22 MED ORDER — RYALTRIS 665-25 MCG/ACT NA SUSP: NASAL | 2 refills | Status: DC

## 2021-12-22 NOTE — Patient Instructions (Addendum)
Allergic rhinitis with conjunctivitis ?- continue avoidance measures for grasses, weeds, trees, mold and cat ?- did not tolerate Xyzal use ?- try Allegra '180mg'$  daily.  This is a long-acting antihistamine similar to Xyzal that you hopefully will tolerate use ?- for nasal congestion/drainage use Ryaltris 2 sprays each nostril daily as needed.  This has a nasal steroid component for nasal congestion control and a nasal antihistamine component for nasal drainage ?- for itchy/watery eyes can use Pataday 1 drop each eye daily as needed ? ?Contact dermatitis ?- you have contact allergy to use of dermabond ?- would not use this product again.  Your surgeon will need to use a different closing option for incisions if you need further surgery ?- you can use eucrisa samples on the area to help with itching.  Use eucrisa twice a day as needed for itch ? ? ?Follow-up in 6 months or sooner if needed ?

## 2021-12-22 NOTE — Progress Notes (Signed)
? ? ?Follow-up Note ? ?RE: Melissa [name] MRN: [id number] DOB: 10/11/1950 ?Date of Office Visit: 12/22/2021 ? ? ?History of present illness: ?Melissa [name] is a 71 y.o. female presenting today for follow-up of allergic rhinitis with conjunctivitis and contact dermatitis.  She was last seen in the office on 06/23/2021 by myself. ?She states xyzal didn't make her feel good so she stopped using it.  She states she was very groggy for like 1.5 days after using.  She states the xyzal worked though.  She reports having throat clearing symptoms and can have hoarseness as well as nasal drainage, throat discomfort.  She is not currently using any nasal sprays. ? ?She states the [place] worked great for the itching and rash she has had related to her contact dermatitis. ? ?Review of systems: ?Review of Systems  ?Constitutional: Negative.   ?HENT:    ?     See HPI  ?Eyes: Negative.   ?Respiratory: Negative.    ?Cardiovascular: Negative.   ?Gastrointestinal: Negative.   ?Musculoskeletal: Negative.   ?Skin: Negative.   ?Allergic/Immunologic: Negative.   ?Neurological: Negative.    ? ?All other systems negative unless noted above in HPI ? ?Past medical/social/surgical/family history have been reviewed and are unchanged unless specifically indicated below. ? ?No changes ? ?Medication List: ?Current Outpatient Medications  ?Medication Sig Dispense Refill  ? Accu-Chek FastClix Lancets MISC USE TO TEST BLOOD SUGAR UP TO FOUR TIMES DAILY 306 each 1  ? ACCU-CHEK GUIDE test strip USE AS DIRECTED FOUR TIMES DAILY 100 strip 11  ? ALPRAZolam (XANAX) 0.25 MG tablet TAKE 1 TABLET(0.25 MG) BY MOUTH AT BEDTIME AS NEEDED FOR SLEEP 30 tablet 5  ? aspirin EC 81 MG tablet Take 1 tablet (81 mg total) by mouth 2 (two) times daily. To be taken after surgery 84 tablet 0  ? blood glucose meter kit and supplies KIT Dispense based on patient and insurance preference. Use up to four times daily as directed. DX Code: E11.9 1 each 0  ? Crisaborole  (EUCRISA) 2 % OINT Apply 1 application topically daily as needed (Itchy skin). 60 g 5  ? cycloSPORINE (RESTASIS) 0.05 % ophthalmic emulsion Place 1 drop into both eyes 2 (two) times daily as needed (dry eyes).    ? desonide (DESOWEN) 0.05 % ointment Apply 1 application topically 2 (two) times daily as needed. 60 g 5  ? glipiZIDE (GLUCOTROL XL) 2.5 MG 24 hr tablet Take 2.5 mg by mouth daily with breakfast.    ? hydrochlorothiazide (HYDRODIURIL) 25 MG tablet Take 1 tablet (25 mg total) by mouth daily. (Patient taking differently: Take 25 mg by mouth daily as needed (swelling).) 30 tablet 0  ? hydroquinone 4 % cream Apply to darker areas of skin once a day as needed 28.35 g 3  ? lisinopril (ZESTRIL) 20 MG tablet Take 1 tablet (20 mg total) by mouth daily. 90 tablet 3  ? omeprazole (PRILOSEC) 20 MG capsule Take 20 mg by mouth daily as needed (heartburn).    ? TRULICITY 1.5 FB/9.0XY SOPN Inject 1.5 mg into the skin every Friday.    ? VYZULTA 0.024 % SOLN Place 1 drop into both eyes at bedtime.    ? ?No current facility-administered medications for this visit.  ?  ? ?Known medication allergies: ?Allergies  ?Allergen Reactions  ? Codeine Other (See Comments)  ?  palpitations  ? Metformin And Related Nausea Only  ? Other   ?  Derma bond - contact dermatitis  ?  Oxycodone Nausea And Vomiting  ? ? ? ?Physical examination: ?Blood pressure 122/82, pulse 81, temperature 98.4 ?F (36.9 ?C), resp. rate 18, height 5' 5"  (1.651 m), weight 203 lb (92.1 kg), SpO2 97 %. ? ?General: Alert, interactive, in no acute distress. ?HEENT: PERRLA, TMs pearly gray, turbinates mildly edematous without discharge, post-pharynx non erythematous. ?Neck: Supple without lymphadenopathy. ?Lungs: Clear to auscultation without wheezing, rhonchi or rales. {no increased work of breathing. ?CV: Normal S1, S2 without murmurs. ?Abdomen: Nondistended, nontender. ?Skin: Warm and dry, without lesions or rashes. ?Extremities:  No clubbing, cyanosis or edema. ?Neuro:    Grossly intact. ? ?Diagnositics/Labs ?None today ? ?Assessment and plan: ?  ?Allergic rhinitis with conjunctivitis ?- continue avoidance measures for grasses, weeds, trees, mold and cat ?- did not tolerate Xyzal use ?- try Allegra 13m daily.  This is a long-acting antihistamine similar to Xyzal that you hopefully will tolerate use ?- for nasal congestion/drainage use Ryaltris 2 sprays each nostril daily as needed.  This has a nasal steroid component for nasal congestion control and a nasal antihistamine component for nasal drainage ?- for itchy/watery eyes can use Pataday 1 drop each eye daily as needed ? ?Contact dermatitis ?- you have contact allergy to use of dermabond ?- would not use this product again.  Your surgeon will need to use a different closing option for incisions if you need further surgery ?- you can use eucrisa samples on the area to help with itching.  Use eucrisa twice a day as needed for itch ? ? ?Follow-up in 6 months or sooner if needed ? ? ?I appreciate the opportunity to take part in Krystyne's care. Please do not hesitate to contact me with questions. ? ?Sincerely, ? ? ?SPrudy Feeler MD ?Allergy/Immunology ?[facility] ? ? ?

## 2021-12-27 ENCOUNTER — Ambulatory Visit: Payer: Medicare Other | Admitting: Orthopaedic Surgery

## 2021-12-30 ENCOUNTER — Encounter (HOSPITAL_BASED_OUTPATIENT_CLINIC_OR_DEPARTMENT_OTHER): Payer: Self-pay

## 2022-01-05 ENCOUNTER — Encounter (HOSPITAL_BASED_OUTPATIENT_CLINIC_OR_DEPARTMENT_OTHER): Payer: Self-pay

## 2022-01-05 ENCOUNTER — Ambulatory Visit (HOSPITAL_BASED_OUTPATIENT_CLINIC_OR_DEPARTMENT_OTHER): Payer: Medicare Other | Admitting: Orthopaedic Surgery

## 2022-01-06 ENCOUNTER — Ambulatory Visit (HOSPITAL_BASED_OUTPATIENT_CLINIC_OR_DEPARTMENT_OTHER): Payer: Medicare Other | Admitting: Orthopaedic Surgery

## 2022-01-10 ENCOUNTER — Ambulatory Visit (INDEPENDENT_AMBULATORY_CARE_PROVIDER_SITE_OTHER): Payer: Medicare Other | Admitting: Orthopaedic Surgery

## 2022-01-10 ENCOUNTER — Ambulatory Visit (INDEPENDENT_AMBULATORY_CARE_PROVIDER_SITE_OTHER): Payer: Medicare Other

## 2022-01-10 DIAGNOSIS — Z96652 Presence of left artificial knee joint: Secondary | ICD-10-CM

## 2022-01-10 NOTE — Progress Notes (Signed)
? ?Office Visit Note ?  ?Patient: Melissa [name]           ?Date of Birth: May 23, 1951           ?MRN: [id number] ?Visit Date: 01/10/2022 ?             ?Requested by: Angelica Pou, MD ?[address] ?[place],  [place] [address] ?PCP: Angelica Pou, MD ? ? ?Assessment & Plan: ?Visit Diagnoses:  ?1. Status post total left knee replacement   ? ? ?Plan: 6 month TKA follow up plan ? ?Patient now 6 months status post left total knee arthroplasty. Wound is healed with no signs of complications or infection.  The patient does not complain of pain, and is back to normal daily activities. It was reinforced that prophylactic antibiotics should be taken with any procedure including but not limited to dental work or colonoscopies.  We will plan on following up at the 12 month postop visit with 2 view xrays of the operative knee at that time. As always, instructions were given to call with any questions or concerns in the interim. ? ?Follow-Up Instructions: Return in about 6 months (around 07/13/2022).  ? ?Orders:  ?Orders Placed This Encounter  ?Procedures  ? XR Knee 1-2 Views Left  ? ?No orders of the defined types were placed in this encounter. ? ? ? ? Procedures: ?No procedures performed ? ? ?Clinical Data: ?No additional findings. ? ? ?Subjective: ?Chief Complaint  ?Patient presents with  ? Left Knee - Routine Post Op  ? ? ?HPI ? ?Melissa [name] is 6 months status post left total knee replacement.  She is doing very well.  No complaints other than some occasional stiffness.  Denies any pain. ? ?Review of Systems ? ? ?Objective: ?Vital Signs: There were no vitals taken for this visit. ? ?Physical Exam ? ?Ortho Exam ? ?Examination of left knee shows full healed surgical scar with excellent functional range of motion.  Stable to varus valgus stress. ? ?Specialty Comments:  ?No specialty comments available. ? ?Imaging: ?XR Knee 1-2 Views Left ? ?Result Date: 01/10/2022 ?Stable total knee replacement in good alignment.    ? ? ?PMFS History: ?Patient Active Problem List  ? Diagnosis Date Noted  ? Elevated troponin 08/23/2021  ? Primary osteoarthritis of left knee 07/11/2021  ? Status post total left knee replacement 07/11/2021  ? DJD (degenerative joint disease) of knee 07/11/2021  ? Great toe pain, left 06/08/2021  ? Sleep apnea 05/24/2021  ? Low grade fever 01/17/2020  ? Frequent urination 10/14/2019  ? OAB (overactive bladder) 10/14/2019  ? Trigger middle finger of right hand 11/12/2018  ? Spondylosis of cervical region without myelopathy or radiculopathy 01/14/2018  ? Chronic left shoulder pain 01/08/2018  ? History of bilateral hip replacements 07/19/2017  ? Rotator cuff tear 02/02/2017  ? Acquired renal cyst of left kidney 01/01/2016  ? Osteoarthritis of spine with radiculopathy, 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  ? DDD (degenerative disc disease), lumbar 03/04/2014  ? Lumbar facet joint pain 02/24/2014  ? IBS (irritable bowel syndrome) 05/13/2013  ? Obesity 11/13/2012  ? Glaucoma   ? Type 2 diabetes mellitus, controlled ([place])   ? Anxiety and depression   ? Vitreous degeneration, unspecified eye 10/25/2012  ? HTN (hypertension) 01/06/2012  ? GERD (gastroesophageal reflux disease) 01/06/2012  ? Hypercholesteremia 01/06/2012  ? ?Past Medical History:  ?Diagnosis Date  ? Allergy   ? Anxiety   ? Arthritis   ? s/p R TKR  ?  Cyst of left kidney 2015  ? by lumbar MRI pending renal US  ? DDD (degenerative disc disease), lumbar 03/2014  ? severe L2-3 with L HNP with L2/3 nerve root impingement [name])  ? Depression with anxiety   ? Diabetes type 2, controlled ([place]) 2011  ? borderline  ? Glaucoma   ? History of ulcer disease   ? HLD (hyperlipidemia)   ? no meds taken  ? Hypertension   ? Plantar fasciitis of left foot 10/01/2013  ? PONV (postoperative nausea and vomiting)   ? Right-sided face pain 01/07/2019  ? Sleep apnea 2022  ?  ?Family History  ?Problem Relation Age of Onset  ? Cancer Maternal Grandmother 22  ?     ovarian  ? Diabetes  Maternal Grandmother   ? Hypertension Maternal Grandmother   ? Cancer Mother 85  ?     bone, MM  ? Stroke Other   ?     unsure who  ? CAD Neg Hx   ? Colon cancer Neg Hx   ? Esophageal cancer Neg Hx   ? Rectal cancer Neg Hx   ? Stomach cancer Neg Hx   ?  ?Past Surgical History:  ?Procedure Laterality Date  ? ABDOMINAL HYSTERECTOMY  2007  ? fibroids, heavy bleeding  ? ARTERY BIOPSY Right 01/23/2019  ? Procedure: REMOVAL OF PART OF RIGHT TEMPORAL ARTERY FOR BIOPSY;  Surgeon: Michael Boston, MD;  Location: [facility];  Service: General;  Laterality: Right;  ? CATARACT EXTRACTION W/ INTRAOCULAR LENS IMPLANT Bilateral   ? COLONOSCOPY  04/2017  ? TA, diverticulosis, rpt ? ([facility])  ? GLAUCOMA SURGERY    ? HAMMER TOE SURGERY Bilateral   ? Right hip replacement    ? TONSILLECTOMY    ? TOTAL HIP ARTHROPLASTY Left 2021  ? TOTAL KNEE ARTHROPLASTY Right 2004  ? TKR (Dr. Eddie Dibbles with [facility])  ? TOTAL KNEE ARTHROPLASTY Left 07/11/2021  ? Procedure: LEFT TOTAL KNEE ARTHROPLASTY;  Surgeon: Leandrew Koyanagi, MD;  Location: [facility];  Service: Orthopedics;  Laterality: Left;  ? TOTAL KNEE REVISION Right 03/2014  ? Dr Al Corpus [phone]  ? TOTAL SHOULDER ARTHROPLASTY Right 2022  ? ?Social History  ? ?Occupational History  ? Occupation: work with disabilities/ Retired  ?  Employer: [employer]  ?Tobacco Use  ? Smoking status: Never  ? Smokeless tobacco: Never  ?Vaping Use  ? Vaping Use: Never used  ?Substance and Sexual Activity  ? Alcohol use: No  ? Drug use: No  ? Sexual activity: Not Currently  ?  Birth control/protection: Surgical  ? ? ? ? ? ? ?

## 2022-01-11 ENCOUNTER — Ambulatory Visit (HOSPITAL_BASED_OUTPATIENT_CLINIC_OR_DEPARTMENT_OTHER): Payer: Medicare Other | Admitting: Orthopaedic Surgery

## 2022-01-25 DIAGNOSIS — T466X5A Adverse effect of antihyperlipidemic and antiarteriosclerotic drugs, initial encounter: Secondary | ICD-10-CM

## 2022-01-25 NOTE — Progress Notes (Signed)
[facility])                                            [place] Team                                        Statin Quality Measure Assessment    01/25/2022  Melissa [name] 1950-10-10 [id number]  Per review of chart and payor information, this patient has been flagged for non-adherence to the following CMS Quality Measure:   '[]'$  Statin Use in Persons with Diabetes  '[x]'$  Statin Use in Persons with Cardiovascular Disease  The 10-year ASCVD risk score (Arnett DK, et al., 2019) is: 32.2%   Values used to calculate the score:     Age: 71 years     Sex: Female     Is Non-Hispanic African American: Yes     Diabetic: Yes     Tobacco smoker: No     Systolic Blood Pressure: 546 mmHg     Is BP treated: Yes     HDL Cholesterol: 64 MG/DL     Total Cholesterol: 262 MG/DL  Last LDL elevated at 150 (in 2020), ASCVD risk 27.4%. No active antihyperlipidemic on file, but noted intolerance. Next PCP appointment is on 01/26/2022.Office visit note on 05/24/2021 , "Currently not on statin due to hx of myalgia, though her LDL and TC are quite high. We'll discuss further as I get to know her situation. She notes that she has pravastatin in her possession but doesn't take it."  If deemed clinically appropriate, please consider assessing need for statin therapy upon an updated lipid panel and associating an exclusion code (see options below).   Please consider ONE of the following recommendations:   Initiate high intensity statin Atorvastatin '40mg'$  once daily, #90, 3 refills   Rosuvastatin '20mg'$  once daily, #90, 3 refills    Initiate moderate intensity          statin with reduced frequency if prior          statin intolerance 1x weekly, #13, 3 refills   2x weekly, #26, 3 refills   3x weekly, #39, 3 refills   Code for past statin intolerance or other exclusions (required annually)  Drug Induced Myopathy G72.0   Myositis, unspecified  M60.9   Rhabdomyolysis M62.82   Cirrhosis of liver K74.69   Myalgia M79.1    Abnormal blood glucose - for SUPD ONLY R73.09   Prediabetes - for SUPD ONLY  R73.03   Polycystic ovarian syndrome E28.2   Adverse effect of antihyperlipidemic and antiarteriosclerotic drugs, initial encounter T03.5W6F   Thank you for your time,  Kristeen Miss, [place] Cell: [phone]

## 2022-01-26 ENCOUNTER — Ambulatory Visit (INDEPENDENT_AMBULATORY_CARE_PROVIDER_SITE_OTHER): Payer: Medicare Other | Admitting: Internal Medicine

## 2022-01-26 ENCOUNTER — Encounter (HOSPITAL_BASED_OUTPATIENT_CLINIC_OR_DEPARTMENT_OTHER): Payer: Self-pay

## 2022-01-26 ENCOUNTER — Encounter: Payer: Self-pay | Admitting: Internal Medicine

## 2022-01-26 ENCOUNTER — Ambulatory Visit (HOSPITAL_BASED_OUTPATIENT_CLINIC_OR_DEPARTMENT_OTHER): Payer: Medicare Other | Admitting: Orthopaedic Surgery

## 2022-01-26 DIAGNOSIS — E119 Type 2 diabetes mellitus without complications: Secondary | ICD-10-CM

## 2022-01-26 DIAGNOSIS — G4730 Sleep apnea, unspecified: Secondary | ICD-10-CM

## 2022-01-26 DIAGNOSIS — Z96653 Presence of artificial knee joint, bilateral: Secondary | ICD-10-CM | POA: Diagnosis not present

## 2022-01-26 DIAGNOSIS — E66811 Obesity, class 1: Secondary | ICD-10-CM

## 2022-01-26 DIAGNOSIS — F32A Depression, unspecified: Secondary | ICD-10-CM | POA: Diagnosis not present

## 2022-01-26 DIAGNOSIS — M170 Bilateral primary osteoarthritis of knee: Secondary | ICD-10-CM | POA: Diagnosis not present

## 2022-01-26 DIAGNOSIS — Z860100 Personal history of colon polyps, unspecified: Secondary | ICD-10-CM

## 2022-01-26 DIAGNOSIS — Z6833 Body mass index (BMI) 33.0-33.9, adult: Secondary | ICD-10-CM

## 2022-01-26 DIAGNOSIS — Z7984 Long term (current) use of oral hypoglycemic drugs: Secondary | ICD-10-CM

## 2022-01-26 DIAGNOSIS — I10 Essential (primary) hypertension: Secondary | ICD-10-CM | POA: Diagnosis not present

## 2022-01-26 DIAGNOSIS — R2689 Other abnormalities of gait and mobility: Secondary | ICD-10-CM | POA: Diagnosis not present

## 2022-01-26 DIAGNOSIS — G8929 Other chronic pain: Secondary | ICD-10-CM

## 2022-01-26 DIAGNOSIS — F419 Anxiety disorder, unspecified: Secondary | ICD-10-CM

## 2022-01-26 DIAGNOSIS — M25512 Pain in left shoulder: Secondary | ICD-10-CM | POA: Diagnosis not present

## 2022-01-26 DIAGNOSIS — E7800 Pure hypercholesterolemia, unspecified: Secondary | ICD-10-CM | POA: Diagnosis not present

## 2022-01-26 DIAGNOSIS — E669 Obesity, unspecified: Secondary | ICD-10-CM

## 2022-01-26 MED ORDER — ROSUVASTATIN CALCIUM 5 MG PO TABS: 5.0000 mg | ORAL_TABLET | Freq: Every day | ORAL | 5 refills | Status: DC

## 2022-01-26 NOTE — Assessment & Plan Note (Signed)
Behavioral health therapy was very beneficial.  Completed treatment.

## 2022-01-26 NOTE — Progress Notes (Signed)
[facility] Res[facility] Telephone Encounter Continuity Care Appointment  HPI:  This telephone encounter was created for [name] on 01/26/2022 for the following purpose/cc inability to attend scheduled appt in person today.     Since our last visit 05/2021, she has undergone a knee replacement, went well, mostly recovered.  L shoulder needs surgery again, but she is hoping to hold off.  This caused her to become depressed, for which she received counseling with Dr. Theodis Shove, which was beneficial, and Melissa [name] has been doing things independently - mental exercises, on line app, praying.  Cousin is supportive.  Takes glipizide 2.5 mg daily prn, only if CBGs > 150 (checked anytime throughout the day including post prandial).  Seldom needs otc omeprezole.  Once or twice a week takes HCTZ for edema, which helps.  Takes xanax nightly for sleep, someimtes only 0.125 mg. "I know I'm hooked on it, I can't sleep without it."  Sometimes gets lightheaded when she gets up.  Has been less active since depressed, spending a lot of time in bed.  Notices it when she gets in warm shower.  Has been offered a shower chair in the past, has grab bars. Must be well hydrated.  Be cautious after HCTZ.  No falls, but is careful.  Uses push cart in grocery store for balance.  Valtrex was prescribed in the past for blisters around the lips or eyes, chin, tiny vesicles and bumps, which have recurred.  For [facility] Remote R posterior thoracic rash.  She understands that there is some relationship with the nerves, and suspects the bumps are developing because her "nerves are bad".    She is seeing a dermatologist and will discuss with her, as I'm unable to evaluate the rash personally today.   Refer to colonoscopy; due for surveillance.    Patient Active Problem List   Diagnosis Date Noted   Elevated troponin 08/23/2021   Primary osteoarthritis of left knee 07/11/2021   Status post total left knee  replacement 07/11/2021   DJD (degenerative joint disease) of knee 07/11/2021   Great toe pain, left 06/08/2021   Sleep apnea 05/24/2021   Low grade fever 01/17/2020   Frequent urination 10/14/2019   OAB (overactive bladder) 10/14/2019   Trigger middle finger of right hand 11/12/2018   Spondylosis of cervical region without myelopathy or radiculopathy 01/14/2018   Chronic left shoulder pain 01/08/2018   History of bilateral hip replacements 07/19/2017   Rotator cuff tear 02/02/2017   Acquired renal cyst of left kidney 01/01/2016   Osteoarthritis of spine with radiculopathy, 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   DDD (degenerative disc disease), lumbar 03/04/2014   Lumbar facet joint pain 02/24/2014   IBS (irritable bowel syndrome) 05/13/2013   Obesity 11/13/2012   Glaucoma    Type 2 diabetes mellitus, controlled ([place])    Anxiety and depression    Vitreous degeneration, unspecified eye 10/25/2012   HTN (hypertension) 01/06/2012   GERD (gastroesophageal reflux disease) 01/06/2012   Hypercholesteremia 01/06/2012    Current Outpatient Medications:    Accu-Chek FastClix Lancets MISC, USE TO TEST BLOOD SUGAR UP TO FOUR TIMES DAILY, Disp: 306 each, Rfl: 1   ACCU-CHEK GUIDE test strip, USE AS DIRECTED FOUR TIMES DAILY, Disp: 100 strip, Rfl: 11   ALPRAZolam (XANAX) 0.25 MG tablet, TAKE 1 TABLET(0.25 MG) BY MOUTH AT BEDTIME AS NEEDED FOR SLEEP, Disp: 30 tablet, Rfl: 5   aspirin EC 81 MG tablet, Take 1 tablet (81 mg total) by mouth 2 (  two) times daily. To be taken after surgery, Disp: 84 tablet, Rfl: 0   blood glucose meter kit and supplies KIT, Dispense based on patient and insurance preference. Use up to four times daily as directed. DX Code: E11.9, Disp: 1 each, Rfl: 0   Crisaborole (EUCRISA) 2 % OINT, Apply 1 application topically daily as needed (Itchy skin)., Disp: 60 g, Rfl: 5   cycloSPORINE (RESTASIS) 0.05 % ophthalmic emulsion, Place 1 drop into both eyes 2 (two) times daily as needed (dry eyes).,  Disp: , Rfl:    desonide (DESOWEN) 0.05 % ointment, Apply 1 application topically 2 (two) times daily as needed., Disp: 60 g, Rfl: 5   glipiZIDE (GLUCOTROL XL) 2.5 MG 24 hr tablet, Take 2.5 mg by mouth daily with breakfast., Disp: , Rfl:    hydrochlorothiazide (HYDRODIURIL) 25 MG tablet, Take 1 tablet (25 mg total) by mouth daily. (Patient taking differently: Take 25 mg by mouth daily as needed (swelling).), Disp: 30 tablet, Rfl: 0   hydroquinone 4 % cream, Apply to darker areas of skin once a day as needed, Disp: 28.35 g, Rfl: 3   lisinopril (ZESTRIL) 20 MG tablet, Take 1 tablet (20 mg total) by mouth daily., Disp: 90 tablet, Rfl: 3   Olopatadine-Mometasone (RYALTRIS) 665-25 MCG/ACT SUSP, Place 2 sprays each nostril daily as needed for nasal congestion, Disp: 29 g, Rfl: 2   omeprazole (PRILOSEC) 20 MG capsule, Take 20 mg by mouth daily as needed (heartburn)., Disp: , Rfl:    TRULICITY 1.5 YT/2.4MQ SOPN, Inject 1.5 mg into the skin every Friday., Disp: , Rfl:    VYZULTA 0.024 % SOLN, Place 1 drop into both eyes at bedtime., Disp: , Rfl:   ROS:  In HPI   Assessment / Plan / Recommendations:  Please see A&P under problem oriented charting for assessment of the patient's acute and chronic medical conditions.  As always, pt is advised that if symptoms worsen or new symptoms arise, they should go to an urgent care facility or to to ER for further evaluation.   Consent and Medical Decision Making:   This is a telephone encounter between Dewayne F Koenen and Angelica Pou on 01/26/2022 for chronic condition follow up including arthritis related pain with mobility, depression, diabetes. The visit was conducted with the patient located at home and Angelica Pou at [facility]. The patient's identity was confirmed using their DOB and current address. The patient has consented to being evaluated through a telephone encounter and understands the associated risks (an examination cannot be done and the  patient may need to come in for an appointment) / benefits (allows the patient to remain at home, decreasing exposure to coronavirus). I personally spent  43  minutes on medical discussion.

## 2022-01-26 NOTE — Assessment & Plan Note (Signed)
Agrees to rosuvastatin very low dose to try to minimize myalgias which she's experienced in the past with pravastatin.

## 2022-01-26 NOTE — Patient Instructions (Signed)
Ms. Aispuro, I enjoyed visiting with you today.  I'm sorry to hear that the joint problems have been so difficult to deal with.  You certainly have had more than your share of surgeries.  We talked about the mental and physical benefits of moving in a guided exercise program, and you agreed to be referred to the Physician's Exercise Program.  You will receive a call to get this arranged.    We are starting a very low dose of a cholerterol medicine called rosuvastatin, or Crestor.  If you do ok with the low dose, we will gradually increase it as tolerated.  You are doing well with your diabetes control, and we can check your A1C when you come at next visit.  Stop the [facility]!  You don't need it!  It is normal for your blood sugar to go up after meals during the day.  Trust the [employer].  It is doing its job.  Time for your colonoscopy.  I placed your referral, so you can get that coordinated with the [place] group.  Take care and be strong.  You're going to be ok. Dr. Jimmye Norman

## 2022-01-26 NOTE — Assessment & Plan Note (Signed)
She returned the apparatus, she wasn't able to wear it.  She declines.

## 2022-01-26 NOTE — Assessment & Plan Note (Signed)
BPs have been high at home in the evenings, up to 150s/80s.  Sometimes she will take her lisinopril 20 mg bid rather than daily when this happens, perhaps 2-3 times a week.  Otherwise 130s/90s.  Sometimes heart rate is fast.

## 2022-01-26 NOTE — Assessment & Plan Note (Addendum)
Last A1C 5.5 07/2021; she continued the glucotrol as needed when CBGs elevated.  Reminded her to stop.  Due for A1C now.  Tolerating Trulicity. Last eye exam about 2-3 months ago.

## 2022-01-27 ENCOUNTER — Telehealth: Payer: Self-pay | Admitting: *Deleted

## 2022-01-27 NOTE — Telephone Encounter (Signed)
Ortho bundle call attempted; no answer and left VM requesting call back.

## 2022-01-30 ENCOUNTER — Telehealth: Payer: Self-pay

## 2022-01-30 NOTE — Telephone Encounter (Signed)
Called re: PREP program referral, left voicemail  

## 2022-02-01 ENCOUNTER — Telehealth: Payer: Self-pay

## 2022-02-01 DIAGNOSIS — R2689 Other abnormalities of gait and mobility: Secondary | ICD-10-CM | POA: Insufficient documentation

## 2022-02-01 NOTE — Assessment & Plan Note (Addendum)
L TKR was successful.  Dr. Erlinda Hong ortho advises prophylactic abx for any dental procedures or colonoscopies for 2 years post op due to underlying diabetes.

## 2022-02-01 NOTE — Telephone Encounter (Signed)
She returned my call; she has concerns about orthopedic limitations esp her shoulder; has appt with MD for evaluation; answered her questions, she is closer to [place], but open to attending [facility]; will contact in June once July schedules are made for next classes.

## 2022-02-01 NOTE — Assessment & Plan Note (Signed)
Referral to physician's exercise program, PREP.

## 2022-02-01 NOTE — Assessment & Plan Note (Signed)
Due for f/u colonoscopy for polyp surveillance -  Will send referral.

## 2022-02-01 NOTE — Assessment & Plan Note (Signed)
Surgery has been advised.  She is well established with her orthopedist.

## 2022-02-08 ENCOUNTER — Encounter (HOSPITAL_BASED_OUTPATIENT_CLINIC_OR_DEPARTMENT_OTHER): Payer: Self-pay

## 2022-02-08 ENCOUNTER — Ambulatory Visit (HOSPITAL_BASED_OUTPATIENT_CLINIC_OR_DEPARTMENT_OTHER): Payer: Medicare Other | Admitting: Orthopaedic Surgery

## 2022-02-08 ENCOUNTER — Encounter: Payer: Medicare Other | Admitting: Internal Medicine

## 2022-02-09 ENCOUNTER — Encounter: Payer: Self-pay | Admitting: Student

## 2022-02-09 ENCOUNTER — Ambulatory Visit (INDEPENDENT_AMBULATORY_CARE_PROVIDER_SITE_OTHER): Payer: Medicare Other | Admitting: Student

## 2022-02-09 VITALS — BP 166/100 | HR 71 | Temp 98.3°F | Ht 65.0 in | Wt 201.5 lb

## 2022-02-09 DIAGNOSIS — I10 Essential (primary) hypertension: Secondary | ICD-10-CM | POA: Diagnosis not present

## 2022-02-09 DIAGNOSIS — R102 Pelvic and perineal pain: Secondary | ICD-10-CM | POA: Diagnosis not present

## 2022-02-09 DIAGNOSIS — K118 Other diseases of salivary glands: Secondary | ICD-10-CM | POA: Diagnosis not present

## 2022-02-09 DIAGNOSIS — E119 Type 2 diabetes mellitus without complications: Secondary | ICD-10-CM

## 2022-02-09 DIAGNOSIS — Z1159 Encounter for screening for other viral diseases: Secondary | ICD-10-CM

## 2022-02-09 DIAGNOSIS — Z23 Encounter for immunization: Secondary | ICD-10-CM | POA: Diagnosis not present

## 2022-02-09 DIAGNOSIS — R21 Rash and other nonspecific skin eruption: Secondary | ICD-10-CM | POA: Diagnosis not present

## 2022-02-09 DIAGNOSIS — E7800 Pure hypercholesterolemia, unspecified: Secondary | ICD-10-CM

## 2022-02-09 DIAGNOSIS — Z0000 Encounter for general adult medical examination without abnormal findings: Secondary | ICD-10-CM

## 2022-02-09 MED ORDER — LISINOPRIL 40 MG PO TABS: 40.0000 mg | ORAL_TABLET | Freq: Every day | ORAL | 5 refills | Status: DC

## 2022-02-09 NOTE — Progress Notes (Unsigned)
   CC: Submandibular mass, pelvic discomfort, rash  HPI:  [name] is a 71 y.o. female with PMH as below who presents to clinic for evaluation of tender submandibular mass, rash and pelvic discomfort. Please see problem based charting for evaluation, assessment and plan.  Past Medical History:  Diagnosis Date   Allergy    Anxiety    Arthritis    s/p R TKR   Cyst of left kidney 2015   by lumbar MRI pending renal US   DDD (degenerative disc disease), lumbar 03/2014   severe L2-3 with L HNP with L2/3 nerve root impingement [name])   Depression with anxiety    Diabetes type 2, controlled ([place]) 2011   borderline   Glaucoma    History of ulcer disease    HLD (hyperlipidemia)    no meds taken   Hypertension    OAB (overactive bladder) 10/14/2019   Osteoarthritis of spine with radiculopathy, 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   Plantar fasciitis of left foot 10/01/2013   PONV (postoperative nausea and vomiting)    Primary osteoarthritis of left knee 07/11/2021   Right-sided face pain 01/07/2019   Rotator cuff tear 02/02/2017   Sleep apnea 2022   Trigger middle finger of right hand 11/12/2018    Review of Systems:  Constitutional: Negative for fever, night sweats, weight changes, loss of appetite or fatigue HEENT: Positive for submandibular mass.  Negative for throat pain or difficulty swallowing. Respiratory: Negative for shortness of breath Abdomen/GU: Positive for pelvic discomfort and urinary frequency.  Negative for dysuria, N/V, diarrhea, or constipation Skin: Positive for rash above right eyebrow Neuro: Negative for headache, dizziness or weakness  Physical Exam: General: Pleasant, well-appearing elderly female. No acute distress. HEENT: Tender, mobile and smooth right submandibular mass as well as nontender, mobile and smooth left submandibular mass. Oropharyngeal with no erythema or masses. No thyromegaly.  Cardiac: RRR. No murmurs, rubs or gallops. No LE  edema Respiratory: Lungs CTAB. No wheezing or crackles. Abdominal/pelvic: Nontender and nondistended abdomen. Normal bowel sounds. Mild tenderness to palpation of the pelvic region. No palpable masses. Skin: Warm, dry. Multiple small developing vesicles on erythematous base above right eyebrow Extremities: Atraumatic. Full ROM. Palpable radial and DP pulses. Neuro: A&O x 3. Moves all extremities.  Normal sensation to gross touch.   Vitals:   02/09/22 1322 02/09/22 1449  BP: (!) 146/100 (!) 166/100  Pulse: 73 71  Temp: 98.3 F (36.8 C)   TempSrc: Oral   SpO2: 98%   Weight: 201 lb 8 oz (91.4 kg)   Height: '5\' 5"'$  (1.651 m)     Assessment & Plan:   See Encounters Tab for problem based charting.  Patient discussed with Dr.  Thomes Cake, MD, MPH

## 2022-02-09 NOTE — Patient Instructions (Addendum)
Thank you, Melissa [name] for allowing [place] to provide your care today. Today we discussed your blood pressure, diabetes, mass near your throat and your pelvic discomfort.  For your blood pressure, I am increasing your lisinopril to 40 mg daily.  Check your blood pressure daily at home and follow-up with [place] in 4 weeks.  You received the pneumonia vaccine today.  For the rash on your forehead, I am referring you back to your dermatologist.  For the mass near your throat, I am referring you back to your ENT doctor  I have ordered the following labs for you:  Lab Orders         Lipid Profile         Hepatitis C Ab reflex to Quant PCR         Urinalysis, Reflex Microscopic         BMP8+Anion Gap         POC Hbg A1C      I will call if any are abnormal. All of your labs can be accessed through "My Chart".  I have place a referrals to ENT and dermatology   I have ordered the following medication/changed the following medications:  Increase lisinopril from 20 to 40 mg daily  My Chart Access: [email or web address]  Please follow-up in 4 weeks or with your PCP, Dr. Jimmye Norman  Please make sure to arrive 15 minutes prior to your next appointment. If you arrive late, you may be asked to reschedule.    We look forward to seeing you next time. Please call our clinic at [phone] if you have any questions or concerns. The best time to call is Monday-Friday from 9am-4pm, but there is someone available 24/7. If after hours or the weekend, call the main hospital number and ask for the Internal Medicine Resident On-Call. If you need medication refills, please notify your pharmacy one week in advance and they will send [place] a request.   Thank you for letting us take part in your care. Wishing you the best!  Lacinda Axon, MD 02/09/2022, 3:06 PM IM Resident, PGY-2 [facility] 41:10

## 2022-02-10 LAB — MICROSCOPIC EXAMINATION
Bacteria, UA: NONE SEEN
Casts: NONE SEEN /lpf
RBC, Urine: NONE SEEN /hpf (ref 0–2)

## 2022-02-10 LAB — URINALYSIS, ROUTINE W REFLEX MICROSCOPIC
Bilirubin, UA: NEGATIVE
Glucose, UA: NEGATIVE
Ketones, UA: NEGATIVE
Nitrite, UA: NEGATIVE
Protein,UA: NEGATIVE
RBC, UA: NEGATIVE
Specific Gravity, UA: 1.019 (ref 1.005–1.030)
Urobilinogen, Ur: 0.2 mg/dL (ref 0.2–1.0)
pH, UA: 5 (ref 5.0–7.5)

## 2022-02-11 LAB — HCV AB W REFLEX TO QUANT PCR: HCV Ab: NONREACTIVE

## 2022-02-11 LAB — LIPID PANEL
Chol/HDL Ratio: 3.5 ratio (ref 0.0–4.4)
Cholesterol, Total: 212 mg/dL — ABNORMAL HIGH (ref 100–199)
HDL: 61 mg/dL (ref >39)
LDL Chol Calc (NIH): 127 mg/dL — ABNORMAL HIGH (ref 0–99)
Triglycerides: 135 mg/dL (ref 0–149)
VLDL Cholesterol Cal: 24 mg/dL (ref 5–40)

## 2022-02-11 LAB — BMP8+ANION GAP
Anion Gap: 20 mmol/L — ABNORMAL HIGH (ref 10.0–18.0)
BUN/Creatinine Ratio: 13 (ref 12–28)
BUN: 11 mg/dL (ref 8–27)
CO2: 18 mmol/L — ABNORMAL LOW (ref 20–29)
Calcium: 10 mg/dL (ref 8.7–10.3)
Chloride: 102 mmol/L (ref 96–106)
Creatinine, Ser: 0.87 mg/dL (ref 0.57–1.00)
Glucose: 85 mg/dL (ref 70–99)
Potassium: 4.5 mmol/L (ref 3.5–5.2)
Sodium: 140 mmol/L (ref 134–144)
eGFR: 71 mL/min/{1.73_m2} (ref >59)

## 2022-02-11 LAB — HCV INTERPRETATION

## 2022-02-12 ENCOUNTER — Encounter: Payer: Self-pay | Admitting: Student

## 2022-02-12 DIAGNOSIS — K118 Other diseases of salivary glands: Secondary | ICD-10-CM | POA: Insufficient documentation

## 2022-02-12 DIAGNOSIS — R21 Rash and other nonspecific skin eruption: Secondary | ICD-10-CM | POA: Insufficient documentation

## 2022-02-12 DIAGNOSIS — R102 Pelvic and perineal pain: Secondary | ICD-10-CM | POA: Insufficient documentation

## 2022-02-12 NOTE — Assessment & Plan Note (Signed)
Lipid panel showed total cholesterol of 212 and LDL of 127.  Patient counseled on decreasing intake of fatty foods to help improve cholesterol. On low-dose rosuvastatin due to myalgias.  Consider increasing rosuvastatin at next office visit with PCP.  Plan: -Continue rosuvastatin 5 mg daily -Lifestyle modification with dietary changes and exercise -Follow-up in 4 weeks

## 2022-02-12 NOTE — Assessment & Plan Note (Addendum)
Patient with a history of a lump in her submandibular area for the last 4 years.  CT soft tissue neck did not show any abnormalities.  Patient was evaluated in early 2020 by Dr. [name].  The mass was thought to be reactive lymph nodes with some tenderness.  Patient completed antibiotic course for this and plan was to monitor with instructions to follow-up if symptoms worsen. Patient states the room has been tender for some time but slightly worse in the last few weeks. She denies any difficulty swallowing, fevers, chills or recent infections.  Exam shows a small tender, mobile and smooth right submandibular mass as well as and nontender continues to move and mobile left submandibular mass.  Oropharyngeal exam unremarkable.  Plan: -ENT referral back to Dr. [name] for reevaluation

## 2022-02-12 NOTE — Assessment & Plan Note (Signed)
Patient presents today for evaluation of her pelvic discomfort since her shoulder surgery in November 2022. Patient states she has had some frequent urinations and noticed some discomfort in her pelvic region after urinating. She denies any dysuria, abdominal pain, back pain, urgency or incontinence. States she is able to make it to the bathroom without difficulty but going more frequently. UA today did not show any signs of infection. Patient's symptoms not consistent with a UTI. The pelvic malignancy also unlikely. Patient had a complete hysterectomy in 2007. 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  Patient will need a pelvic exam to further evaluate her pelvic discomfort.  Plan: -Pelvic exam at next office visit

## 2022-02-12 NOTE — Assessment & Plan Note (Signed)
Patient received the Prevnar vaccine today Hepatitis C screening negative today

## 2022-02-12 NOTE — Assessment & Plan Note (Signed)
Blood pressure elevated with SBP in the 140s to 160s today.  Patient currently on lisinopril 20 mg and HCTZ 25 mg daily as needed for swelling.  States she sometimes check her blood pressure at home and they remains high in the 140s up to the 160s. BP still not at goal of <130/90.  We will increase her lisinopril today and follow-up in 4 weeks.  BMP showed normal kidney function with mild anion gap metabolic acidosis.  Plan: -Increase lisinopril from 20 to 40 mg daily -Continue HCTZ 25 mg as needed as needed for swelling -Follow-up in 4 weeks for repeat BMP and BP

## 2022-02-12 NOTE — Assessment & Plan Note (Addendum)
Patient with a history of shingles and cold sores presents today for evaluation of a rash. Patient states she has previously been treated for shingles on her back as well as cold sores with Valtrex. She has had intermittent sores around her lips and nose that improves within a few days. She reports a rash above her right eyebrow that has not resolved over the last few weeks. The rash is slightly tender and itchy. On exam, patient has multiple small developing vesicles on erythematous base above the right eyebrow. Patient's rash looks like possible herpes rash, however the location and the time course is unusual for this type of rash. Patient advised to keep the area clean, washing hands frequently after touching the rash to avoid spreading it to other parts of her body, especially her eye. We will refer patient to dermatology for further evaluation.  Plan: -Dermatology referral -Consider as needed Valtrex prescription if patient has frequent eruptions of cold sores

## 2022-02-13 NOTE — Addendum Note (Signed)
Addended by: Charise Killian on: 02/13/2022 09:36 AM   Modules accepted: Level of Service

## 2022-02-13 NOTE — Progress Notes (Signed)
Internal Medicine Clinic Attending  Case discussed with Dr. Amponsah  At the time of the visit.  We reviewed the resident's history and exam and pertinent patient test results.  I agree with the assessment, diagnosis, and plan of care documented in the resident's note.  

## 2022-02-16 ENCOUNTER — Other Ambulatory Visit: Payer: Self-pay | Admitting: Internal Medicine

## 2022-02-16 ENCOUNTER — Telehealth: Payer: Self-pay | Admitting: *Deleted

## 2022-02-16 ENCOUNTER — Telehealth: Payer: Self-pay

## 2022-02-16 DIAGNOSIS — F419 Anxiety disorder, unspecified: Secondary | ICD-10-CM

## 2022-02-16 MED ORDER — ALPRAZOLAM 0.25 MG PO TABS: ORAL_TABLET | ORAL | 0 refills | Status: DC

## 2022-02-16 NOTE — Telephone Encounter (Signed)
Xanax was refilled today. Pt was called and informed.

## 2022-02-16 NOTE — Telephone Encounter (Signed)
ALPRAZolam (XANAX) 0.25 MG tablet, REFILL REQUEST @ [facility] [phone] - [place], [place] - [address] AT [address].

## 2022-02-16 NOTE — Telephone Encounter (Signed)
Message from patient that she is unable to get her prescription for Xanax filled this month.  Call to Pharmacy as patient has refills.  Unable to fill under Dr. Jimmye Norman.    Call to [facility] Medicaid at [phone].  Unsure as to why prescription can not be filled using Dr. Grace Bushy name.    Will need to have a new prescription sent to the Pharmacy under another Physician.

## 2022-02-28 ENCOUNTER — Telehealth: Payer: Self-pay

## 2022-03-16 ENCOUNTER — Telehealth: Payer: Self-pay

## 2022-03-16 NOTE — Telephone Encounter (Signed)
Requesting to speak with a nurse about getting medication for depression. States at her last office she did mention this to the doctor. Please call pt back.

## 2022-03-17 ENCOUNTER — Other Ambulatory Visit: Payer: Self-pay | Admitting: Internal Medicine

## 2022-03-17 ENCOUNTER — Telehealth: Payer: Self-pay | Admitting: Internal Medicine

## 2022-03-17 ENCOUNTER — Telehealth: Payer: Self-pay

## 2022-03-17 DIAGNOSIS — F339 Major depressive disorder, recurrent, unspecified: Secondary | ICD-10-CM

## 2022-03-17 DIAGNOSIS — F419 Anxiety disorder, unspecified: Secondary | ICD-10-CM

## 2022-03-17 DIAGNOSIS — G8929 Other chronic pain: Secondary | ICD-10-CM

## 2022-03-17 MED ORDER — DULOXETINE HCL 20 MG PO CPEP: 20.0000 mg | ORAL_CAPSULE | Freq: Every day | ORAL | 0 refills | Status: DC

## 2022-03-17 NOTE — Telephone Encounter (Signed)
I returned call to [name], who had contacted clinic yesterday with concern about worsening depression and anxiety.  She carries a diagnosis of both, but has not taken an antidepressant for a very long time.  She recalls intolerance/uncomfortable side effect of being overly giddy when prescribed Prozac.  She is currently experiencing psychomotor retardation, hypersomnolence, and increased food consumption.  She is finding it difficult to get out of bed to do her usual activities and attend appointments.  She is not experiencing suicidal ideation.  She does have people she can rely on when if she to just reach out; she is not ready to consider talk therapy at this time.  Duloxetine starting at low-dose 20 mg daily is recommended to target the depression, anxiety, and her chronic joint pain arising from numerous orthopedic problems.  She will take this for a month and contact me via MyChart to apprise me of how she is doing.  Dose will be gradually increased, and she will follow-up with me in person on 04/14/2022.  I reminded her that she can reach out to our office by phone or MyChart at any time.

## 2022-03-17 NOTE — Assessment & Plan Note (Signed)
She carries a diagnosis of both, but has not taken an antidepressant for a very long time.  She recalls intolerance/uncomfortable side effect of being overly giddy when prescribed Prozac.  She is currently experiencing psychomotor retardation, hypersomnolence, and increased food consumption.  She is finding it difficult to get out of bed to do her usual activities and attend appointments.  She is not experiencing suicidal ideation.  She does have people she can rely on when if she to just reach out; she is not ready to consider talk therapy at this time.  Duloxetine starting at low-dose 20 mg daily is recommended to target the depression, anxiety, and her chronic joint pain arising from numerous orthopedic problems.  She will take this for a month and contact me via MyChart to apprise me of how she is doing.  Dose will be gradually increased, and she will follow-up with me in person on 04/14/2022.  I reminded her that she can reach out to our office by phone or MyChart at any time.

## 2022-03-17 NOTE — Telephone Encounter (Signed)
Pt is calling back to speak with a nurse about depression medication. Please call pt back.

## 2022-03-21 ENCOUNTER — Ambulatory Visit (INDEPENDENT_AMBULATORY_CARE_PROVIDER_SITE_OTHER): Payer: Medicare Other | Admitting: Student

## 2022-03-21 DIAGNOSIS — F339 Major depressive disorder, recurrent, unspecified: Secondary | ICD-10-CM | POA: Diagnosis not present

## 2022-03-21 MED ORDER — ESCITALOPRAM OXALATE 10 MG PO TABS: 10.0000 mg | ORAL_TABLET | Freq: Every day | ORAL | 1 refills | Status: DC

## 2022-03-21 NOTE — Progress Notes (Signed)
   CC: Medication reaction  This is a telephone encounter between Melissa [name] and Melissa [name] on 03/21/2022 for medication reaction.  The visit was conducted with the patient located at home and Melissa [name] at [facility]. The patient's identity was confirmed using their DOB and current address. The patient has consented to being evaluated through a telephone encounter and understands the associated risks (an examination cannot be done and the patient may need to come in for an appointment) / benefits (allows the patient to remain at home, decreasing exposure to coronavirus). I personally spent 15 minutes on medical discussion.   HPI:  [name] is a 71 y.o. with PMH as below.  Patient reports having itching and nausea with the new medication that she started.  Patient was recently started on duloxetine 20 mg daily 4 days ago.  She states that since taking it, she has developed nausea and itching everywhere.  Patient request to have a different medication.  Please see A&P for assessment of the patient's acute and chronic medical conditions.   Past Medical History:  Diagnosis Date   Allergy    Anxiety    Arthritis    s/p R TKR   Cyst of left kidney 2015   by lumbar MRI pending renal US   DDD (degenerative disc disease), lumbar 03/2014   severe L2-3 with L HNP with L2/3 nerve root impingement [name])   Depression with anxiety    Diabetes type 2, controlled ([place]) 2011   borderline   Glaucoma    History of ulcer disease    HLD (hyperlipidemia)    no meds taken   Hypertension    OAB (overactive bladder) 10/14/2019   Osteoarthritis of spine with radiculopathy, 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   Plantar fasciitis of left foot 10/01/2013   PONV (postoperative nausea and vomiting)    Primary osteoarthritis of left knee 07/11/2021   Right-sided face pain 01/07/2019   Rotator cuff tear 02/02/2017   Sleep apnea 2022   Trigger middle finger of right hand 11/12/2018   Review of Systems:    Constitutional: Patient endorses pruritus Respiratory: Patient denies any shortness of breath GI: Patient endorses vomiting   Assessment & Plan:   Major depressive disorder, recurrent episode with anxious distress ([place]) Patient was recently started on duloxetine 20 mg daily.  This was 4 days ago.  Patient states that she has a history of not being able to tolerate SSRIs or SNRIs.  Patient states that she has tried Cymbalta, Lexapro, Prozac, and Celexa.  Patient reports that she called her pharmacy to see what other medication she can try.  She states that the pharmacist said she can try Celexa.  I speak with the patient and she told me that she was able to tolerate Lexapro in the past and that it helped her the best.  I feel that instead of trying a new medication, we could try Lexapro again, given that she reported that was the one that helped her the best.  Plan: - Discontinue duloxetine 20 mg daily - Start Lexapro 10 mg daily    Patient seen with Dr. Veneda Melter, DO  Internal Medicine Resident

## 2022-03-21 NOTE — Assessment & Plan Note (Signed)
Patient was recently started on duloxetine 20 mg daily.  This was 4 days ago.  Patient states that she has a history of not being able to tolerate SSRIs or SNRIs.  Patient states that she has tried Cymbalta, Lexapro, Prozac, and Celexa.  Patient reports that she called her pharmacy to see what other medication she can try.  She states that the pharmacist said she can try Celexa.  I speak with the patient and she told me that she was able to tolerate Lexapro in the past and that it helped her the best.  I feel that instead of trying a new medication, we could try Lexapro again, given that she reported that was the one that helped her the best.  Plan: - Discontinue duloxetine 20 mg daily - Start Lexapro 10 mg daily

## 2022-03-21 NOTE — Patient Instructions (Addendum)
Melissa [name], [place] you for allowing me to take part in your care today.  Here are your instructions.  1.  Today we discussed your medication changes.  I we would like you to stop taking duloxetine 20 mg daily.  2.  Start taking Lexapro 10 mg daily  3.  Follow-up in 3 months regarding your depression  Thank you, Dr. Posey Pronto  If you have any other questions please contact the internal medicine clinic at [phone]

## 2022-03-23 ENCOUNTER — Encounter: Payer: Self-pay | Admitting: Gastroenterology

## 2022-03-27 NOTE — Progress Notes (Signed)
Internal Medicine Clinic Attending  I evaluated the patient.  I personally confirmed the key portions of the history documented by Dr. Posey Pronto and I reviewed pertinent patient test results.  The assessment, diagnosis, and plan were formulated together and I agree with the documentation in the resident's note.

## 2022-04-07 ENCOUNTER — Ambulatory Visit
Admission: EM | Admit: 2022-04-07 | Discharge: 2022-04-07 | Disposition: A | Discharge status: Home / Self Care | Payer: Medicare Other | Attending: Physician Assistant | Admitting: Physician Assistant

## 2022-04-07 DIAGNOSIS — R21 Rash and other nonspecific skin eruption: Secondary | ICD-10-CM | POA: Diagnosis not present

## 2022-04-07 MED ORDER — VALACYCLOVIR HCL 1 G PO TABS: 1000.0000 mg | ORAL_TABLET | Freq: Three times a day (TID) | ORAL | 0 refills | Status: DC

## 2022-04-07 NOTE — ED Triage Notes (Signed)
Pt c/o "viral outbreak" to face. States this happened before and was prescribed an rx that started with a "V" that "cleared it right up." This was several years ago. Current onset ~ 3 months ago.

## 2022-04-07 NOTE — ED Provider Notes (Signed)
[name]    CSN: [id number] Arrival date & time: 04/07/22  1318      History   Chief Complaint Chief Complaint  Patient presents with   viral outbreak    HPI Melissa [name] is a 71 y.o. female.   Patient here today for evaluation of rash on her face that she noticed recently.  She reports in the past she has had similar outbreak and was diagnosed with shingles.  She was given a medication that started with a "V" which cleared up rash.  She has not had fever.  She does report that she has had increased stress recently.  She reports rash can be itchy but also painful.  She currently has a rash to left side of her face, and does not report rash elsewhere.  The history is provided by the patient.    Past Medical History:  Diagnosis Date   Allergy    Anxiety    Arthritis    s/p R TKR   Cyst of left kidney 2015   by lumbar MRI pending renal US   DDD (degenerative disc disease), lumbar 03/2014   severe L2-3 with L HNP with L2/3 nerve root impingement [name])   Depression with anxiety    Diabetes type 2, controlled ([place]) 2011   borderline   Glaucoma    History of ulcer disease    HLD (hyperlipidemia)    no meds taken   Hypertension    OAB (overactive bladder) 10/14/2019   Osteoarthritis of spine with radiculopathy, 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   Plantar fasciitis of left foot 10/01/2013   PONV (postoperative nausea and vomiting)    Primary osteoarthritis of left knee 07/11/2021   Right-sided face pain 01/07/2019   Rotator cuff tear 02/02/2017   Sleep apnea 2022   Trigger middle finger of right hand 11/12/2018    Patient Active Problem List   Diagnosis Date Noted   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   Pelvic pain 02/12/2022   Rash 02/12/2022   Decreased functional mobility 02/01/2022   Personal history of colonic polyps 01/26/2022   History of total knee replacement, bilateral 07/11/2021   Sleep apnea, intolerant of CPAP, declines 05/24/2021    Healthcare maintenance 01/07/2019   Spondylosis of cervical region without myelopathy or radiculopathy 01/14/2018   Chronic left shoulder pain 01/08/2018   History of bilateral hip replacements 07/19/2017   Acquired renal cyst of left kidney 01/01/2016   DDD (degenerative disc disease), lumbar 03/04/2014   Obesity 11/13/2012   Glaucoma    Type 2 diabetes mellitus, controlled ([place])    Major depressive disorder, recurrent episode with anxious distress ([place])    Vitreous degeneration, unspecified eye 10/25/2012   HTN (hypertension) 01/06/2012   GERD (gastroesophageal reflux disease) 01/06/2012   Hypercholesteremia 01/06/2012    Past Surgical History:  Procedure Laterality Date   ABDOMINAL HYSTERECTOMY  2007   fibroids, heavy bleeding   ARTERY BIOPSY Right 01/23/2019   Procedure: REMOVAL OF PART OF RIGHT TEMPORAL ARTERY FOR BIOPSY;  Surgeon: Michael Boston, MD;  Location: [place];  Service: General;  Laterality: Right;   CATARACT EXTRACTION W/ INTRAOCULAR LENS IMPLANT Bilateral    COLONOSCOPY  04/2017   TA, diverticulosis, rpt ? ([facility])   GLAUCOMA SURGERY     HAMMER TOE SURGERY Bilateral    Right hip replacement     TONSILLECTOMY     TOTAL HIP ARTHROPLASTY Left 2021   TOTAL KNEE ARTHROPLASTY Right 2004   TKR (Dr. Eddie Dibbles with  [facility])   TOTAL KNEE ARTHROPLASTY Left 07/11/2021   Procedure: LEFT TOTAL KNEE ARTHROPLASTY;  Surgeon: Leandrew Koyanagi, MD;  Location: [place];  Service: Orthopedics;  Laterality: Left;   TOTAL KNEE REVISION Right 03/2014   Dr Al Corpus [facility]   TOTAL SHOULDER ARTHROPLASTY Right 2022    OB History     Gravida  0   Para      Term      Preterm      AB      Living  0      SAB      IAB      Ectopic      Multiple      Live Births               Home Medications    Prior to Admission medications   Medication Sig Start Date End Date Taking? Authorizing Provider  valACYclovir (VALTREX) 1000 MG tablet Take 1 tablet (1,000 mg total) by mouth 3  (three) times daily. 04/07/22  Yes Francene Finders, PA-C  Accu-Chek FastClix Lancets MISC USE TO TEST BLOOD SUGAR UP TO FOUR TIMES DAILY 11/25/18   Dragnev, Caesar Chestnut, NP  ACCU-CHEK GUIDE test strip USE AS DIRECTED FOUR TIMES DAILY 06/28/20   Biagio Borg, MD  ALPRAZolam Duanne Moron) 0.25 MG tablet TAKE 1 TABLET(0.25 MG) BY MOUTH AT BEDTIME AS NEEDED FOR SLEEP 02/16/22   Charise Killian, MD  blood glucose meter kit and supplies KIT Dispense based on patient and insurance preference. Use up to four times daily as directed. DX Code: E11.9 02/11/18   Margot Ables, NP  cycloSPORINE (RESTASIS) 0.05 % ophthalmic emulsion Place 1 drop into both eyes 2 (two) times daily as needed (dry eyes).    [provider]  escitalopram (LEXAPRO) 10 MG tablet Take 1 tablet (10 mg total) by mouth daily. 03/21/22 09/17/22  Riesa Pope, MD  hydrochlorothiazide (HYDRODIURIL) 25 MG tablet Take 1 tablet (25 mg total) by mouth daily. Patient taking differently: Take 25 mg by mouth daily as needed (swelling). 05/07/20   Tasia Catchings, Amy V, PA-C  hydroquinone 4 % cream Apply to darker areas of skin once a day as needed 07/07/21   Angelica Pou, MD  lisinopril (ZESTRIL) 40 MG tablet Take 1 tablet (40 mg total) by mouth daily. 02/09/22   Lacinda Axon, MD  Olopatadine-Mometasone Rennie Plowman) [phone] MCG/ACT SUSP Place 2 sprays each nostril daily as needed for nasal congestion 12/22/21   Kennith Gain, MD  omeprazole (PRILOSEC) 20 MG capsule Take 20 mg by mouth daily as needed (heartburn).    [provider]  rosuvastatin (CRESTOR) 5 MG tablet Take 1 tablet (5 mg total) by mouth daily. 01/26/22   Angelica Pou, MD  TRULICITY 1.5 LO/7.5IE SOPN Inject 1.5 mg into the skin every Friday. 03/16/21   [provider]  VYZULTA 0.024 % SOLN Place 1 drop into both eyes at bedtime. 05/16/21   [provider]    Family History Family History  Problem Relation Age of Onset    Cancer Maternal Grandmother 77       ovarian   Diabetes Maternal Grandmother    Hypertension Maternal Grandmother    Cancer Mother 31       bone, MM   Stroke Other        unsure who   CAD Neg Hx    Colon cancer Neg Hx    Esophageal cancer Neg Hx    Rectal  cancer Neg Hx    Stomach cancer Neg Hx     Social History Social History   Tobacco Use   Smoking status: Never   Smokeless tobacco: Never  Vaping Use   Vaping Use: Never used  Substance Use Topics   Alcohol use: No   Drug use: No     Allergies   Codeine, Metformin and related, Other, and Oxycodone   Review of Systems Review of Systems  Constitutional:  Negative for chills and fever.  HENT:  Negative for drooling.   Eyes:  Negative for discharge and redness.  Respiratory:  Negative for shortness of breath and wheezing.   Gastrointestinal:  Negative for nausea and vomiting.  Skin:  Positive for rash.     Physical Exam Triage Vital Signs ED Triage Vitals  Enc Vitals Group     BP      Pulse      Resp      Temp      Temp src      SpO2      Weight      Height      Head Circumference      Peak Flow      Pain Score      Pain Loc      Pain Edu?      Excl. in [place]?    No data found.  Updated Vital Signs BP 116/78 (BP Location: Left Arm)   Pulse 70   Temp 98 F (36.7 C) (Oral)   Resp 18   SpO2 97%      Physical Exam Vitals and nursing note reviewed.  Constitutional:      General: She is not in acute distress.    Appearance: Normal appearance. She is not ill-appearing.  HENT:     Head: Normocephalic and atraumatic.     Nose: Nose normal.  Cardiovascular:     Rate and Rhythm: Normal rate.  Pulmonary:     Effort: Pulmonary effort is normal. No respiratory distress.  Skin:    General: Skin is warm and dry.     Findings: Rash (few papular, vesicular appearing lesions with mild erythema to left forehead, scarring with hyperpigmentation noted to bilateral lower face) present. Lesion: few  erythematous papular/ vesicular lesions to left forehead. Neurological:     Mental Status: She is alert.  Psychiatric:        Mood and Affect: Mood normal.        Thought Content: Thought content normal.      UC Treatments / Results  Labs (all labs ordered are listed, but only abnormal results are displayed) Labs Reviewed - No data to display  EKG   Radiology No results found.  Procedures Procedures (including critical care time)  Medications Ordered in UC Medications - No data to display  Initial Impression / Assessment and Plan / UC Course  I have reviewed the triage vital signs and the nursing notes.  Pertinent labs & imaging results that were available during my care of the patient were reviewed by me and considered in my medical decision making (see chart for details).    Will treat with vatrex as rash does appear consistent with shingles and patient states this has helped in the past. Recommended follow up if no improvement.   Final Clinical Impressions(s) / UC Diagnoses   Final diagnoses:  Rash and nonspecific skin eruption   Discharge Instructions   None    ED Prescriptions     Medication  Sig Dispense Auth. Provider   valACYclovir (VALTREX) 1000 MG tablet Take 1 tablet (1,000 mg total) by mouth 3 (three) times daily. 21 tablet Francene Finders, PA-C      PDMP not reviewed this encounter.   Francene Finders, PA-C 04/07/22 1426

## 2022-04-14 DIAGNOSIS — Z961 Presence of intraocular lens: Secondary | ICD-10-CM | POA: Diagnosis not present

## 2022-04-14 DIAGNOSIS — Z794 Long term (current) use of insulin: Secondary | ICD-10-CM | POA: Diagnosis not present

## 2022-04-14 DIAGNOSIS — E119 Type 2 diabetes mellitus without complications: Secondary | ICD-10-CM | POA: Diagnosis not present

## 2022-04-14 DIAGNOSIS — H401133 Primary open-angle glaucoma, bilateral, severe stage: Secondary | ICD-10-CM | POA: Diagnosis not present

## 2022-04-19 ENCOUNTER — Encounter: Payer: Self-pay | Admitting: Emergency Medicine

## 2022-04-19 ENCOUNTER — Ambulatory Visit
Admission: EM | Admit: 2022-04-19 | Discharge: 2022-04-19 | Disposition: A | Discharge status: Home / Self Care | Payer: Medicare Other | Attending: Internal Medicine | Admitting: Internal Medicine

## 2022-04-19 DIAGNOSIS — R21 Rash and other nonspecific skin eruption: Secondary | ICD-10-CM

## 2022-04-19 MED ORDER — VALACYCLOVIR HCL 1 G PO TABS: 1000.0000 mg | ORAL_TABLET | Freq: Three times a day (TID) | ORAL | 0 refills | Status: DC

## 2022-04-19 NOTE — Discharge Instructions (Signed)
You have been prescribed another 1 week course of Valtrex.  I recommend you follow-up with your primary care doctor if symptoms do not improve with this course.

## 2022-04-19 NOTE — ED Triage Notes (Signed)
Pt report was seen here recently and got a prescription cream that needs little bit out of. Cream starts with a "V".

## 2022-04-19 NOTE — ED Triage Notes (Signed)
Pt found medication name in phone. Valacyclopir cream was name of medications.

## 2022-04-19 NOTE — ED Provider Notes (Signed)
[name]    CSN: [id number] Arrival date & time: 04/19/22  1315      History   Chief Complaint Chief Complaint  Patient presents with   Medication Refill    HPI Melissa [name] is a 71 y.o. female.   Patient presents for refill on valacyclovir medication.  Patient reports that she was seen on 04/07/2022 for suspicion of herpes zoster to her face.  She was treated with valacyclovir medication with improvement but states that it has not completely resolved.  She states that she has history of similar rash to face in the past and was treated with Valtrex given suspicion of shingles with resolution of symptoms.  Patient reports that itchy/painful rash started to face about 1 to 2 weeks ago.  It present to bilateral cheeks of face with associated swelling.  It started off as blisterlike lesions that have now improved and swelling has improved. There is no current eye or ear involvement.  Denies any associated fevers or changes in the environment.   Medication Refill   Past Medical History:  Diagnosis Date   Allergy    Anxiety    Arthritis    s/p R TKR   Cyst of left kidney 2015   by lumbar MRI pending renal US   DDD (degenerative disc disease), lumbar 03/2014   severe L2-3 with L HNP with L2/3 nerve root impingement [name])   Depression with anxiety    Diabetes type 2, controlled ([place]) 2011   borderline   Glaucoma    History of ulcer disease    HLD (hyperlipidemia)    no meds taken   Hypertension    OAB (overactive bladder) 10/14/2019   Osteoarthritis of spine with radiculopathy, 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   Plantar fasciitis of left foot 10/01/2013   PONV (postoperative nausea and vomiting)    Primary osteoarthritis of left knee 07/11/2021   Right-sided face pain 01/07/2019   Rotator cuff tear 02/02/2017   Sleep apnea 2022   Trigger middle finger of right hand 11/12/2018    Patient Active Problem List   Diagnosis Date Noted   Submandibular gland mass  02/12/2022   Pelvic pain 02/12/2022   Rash 02/12/2022   Decreased functional mobility 02/01/2022   Personal history of colonic polyps 01/26/2022   History of total knee replacement, bilateral 07/11/2021   Sleep apnea, intolerant of CPAP, declines 05/24/2021   Healthcare maintenance 01/07/2019   Spondylosis of cervical region without myelopathy or radiculopathy 01/14/2018   Chronic left shoulder pain 01/08/2018   History of bilateral hip replacements 07/19/2017   Acquired renal cyst of left kidney 01/01/2016   DDD (degenerative disc disease), lumbar 03/04/2014   Obesity 11/13/2012   Glaucoma    Type 2 diabetes mellitus, controlled ([place])    Major depressive disorder, recurrent episode with anxious distress ([place])    Vitreous degeneration, unspecified eye 10/25/2012   HTN (hypertension) 01/06/2012   GERD (gastroesophageal reflux disease) 01/06/2012   Hypercholesteremia 01/06/2012    Past Surgical History:  Procedure Laterality Date   ABDOMINAL HYSTERECTOMY  2007   fibroids, heavy bleeding   ARTERY BIOPSY Right 01/23/2019   Procedure: REMOVAL OF PART OF RIGHT TEMPORAL ARTERY FOR BIOPSY;  Surgeon: Michael Boston, MD;  Location: [place];  Service: General;  Laterality: Right;   CATARACT EXTRACTION W/ INTRAOCULAR LENS IMPLANT Bilateral    COLONOSCOPY  04/2017   TA, diverticulosis, rpt ? ([facility])   GLAUCOMA SURGERY     HAMMER TOE SURGERY Bilateral  Right hip replacement     TONSILLECTOMY     TOTAL HIP ARTHROPLASTY Left 2021   TOTAL KNEE ARTHROPLASTY Right 2004   TKR (Dr. Eddie Dibbles with [facility])   TOTAL KNEE ARTHROPLASTY Left 07/11/2021   Procedure: LEFT TOTAL KNEE ARTHROPLASTY;  Surgeon: Leandrew Koyanagi, MD;  Location: [place];  Service: Orthopedics;  Laterality: Left;   TOTAL KNEE REVISION Right 03/2014   Dr Al Corpus [facility]   TOTAL SHOULDER ARTHROPLASTY Right 2022    OB History     Gravida  0   Para      Term      Preterm      AB      Living  0      SAB      IAB       Ectopic      Multiple      Live Births               Home Medications    Prior to Admission medications   Medication Sig Start Date End Date Taking? Authorizing Provider  Accu-Chek FastClix Lancets MISC USE TO TEST BLOOD SUGAR UP TO FOUR TIMES DAILY 11/25/18   Dragnev, Caesar Chestnut, NP  ACCU-CHEK GUIDE test strip USE AS DIRECTED FOUR TIMES DAILY 06/28/20   Biagio Borg, MD  ALPRAZolam Duanne Moron) 0.25 MG tablet TAKE 1 TABLET(0.25 MG) BY MOUTH AT BEDTIME AS NEEDED FOR SLEEP 02/16/22   Charise Killian, MD  blood glucose meter kit and supplies KIT Dispense based on patient and insurance preference. Use up to four times daily as directed. DX Code: E11.9 02/11/18   Margot Ables, NP  cycloSPORINE (RESTASIS) 0.05 % ophthalmic emulsion Place 1 drop into both eyes 2 (two) times daily as needed (dry eyes).    [provider]  escitalopram (LEXAPRO) 10 MG tablet Take 1 tablet (10 mg total) by mouth daily. 03/21/22 09/17/22  Riesa Pope, MD  hydrochlorothiazide (HYDRODIURIL) 25 MG tablet Take 1 tablet (25 mg total) by mouth daily. Patient taking differently: Take 25 mg by mouth daily as needed (swelling). 05/07/20   Tasia Catchings, Amy V, PA-C  hydroquinone 4 % cream Apply to darker areas of skin once a day as needed 07/07/21   Angelica Pou, MD  lisinopril (ZESTRIL) 40 MG tablet Take 1 tablet (40 mg total) by mouth daily. 02/09/22   Lacinda Axon, MD  Olopatadine-Mometasone Rennie Plowman) [phone] MCG/ACT SUSP Place 2 sprays each nostril daily as needed for nasal congestion 12/22/21   Kennith Gain, MD  omeprazole (PRILOSEC) 20 MG capsule Take 20 mg by mouth daily as needed (heartburn).    [provider]  rosuvastatin (CRESTOR) 5 MG tablet Take 1 tablet (5 mg total) by mouth daily. 01/26/22   Angelica Pou, MD  TRULICITY 1.5 LK/9.5FM SOPN Inject 1.5 mg into the skin every Friday. 03/16/21   [provider]  valACYclovir (VALTREX) 1000 MG tablet  Take 1 tablet (1,000 mg total) by mouth 3 (three) times daily. 04/19/22   Rosalva Neary, Michele Rockers, FNP  VYZULTA 0.024 % SOLN Place 1 drop into both eyes at bedtime. 05/16/21   [provider]    Family History Family History  Problem Relation Age of Onset   Cancer Maternal Grandmother 16       ovarian   Diabetes Maternal Grandmother    Hypertension Maternal Grandmother    Cancer Mother 44       bone, MM   Stroke Other  unsure who   CAD Neg Hx    Colon cancer Neg Hx    Esophageal cancer Neg Hx    Rectal cancer Neg Hx    Stomach cancer Neg Hx     Social History Social History   Tobacco Use   Smoking status: Never   Smokeless tobacco: Never  Vaping Use   Vaping Use: Never used  Substance Use Topics   Alcohol use: No   Drug use: No     Allergies   Codeine, Metformin and related, Other, and Oxycodone   Review of Systems Review of Systems Per HPI  Physical Exam Triage Vital Signs ED Triage Vitals  Enc Vitals Group     BP 04/19/22 1545 (!) 145/96     Pulse Rate 04/19/22 1545 72     Resp 04/19/22 1545 17     Temp 04/19/22 1545 98.1 F (36.7 C)     Temp Source 04/19/22 1545 Oral     SpO2 04/19/22 1545 97 %     Weight --      Height --      Head Circumference --      Peak Flow --      Pain Score 04/19/22 1543 0     Pain Loc --      Pain Edu? --      Excl. in [place]? --    No data found.  Updated Vital Signs BP (!) 145/96 (BP Location: Left Arm) Comment: hasnt had HTN medication today yet  Pulse 72   Temp 98.1 F (36.7 C) (Oral)   Resp 17   SpO2 97%   Visual Acuity Right Eye Distance:   Left Eye Distance:   Bilateral Distance:    Right Eye Near:   Left Eye Near:    Bilateral Near:     Physical Exam Constitutional:      General: She is not in acute distress.    Appearance: Normal appearance. She is not toxic-appearing or diaphoretic.  HENT:     Head: Normocephalic and atraumatic.  Eyes:     Extraocular Movements: Extraocular movements  intact.     Conjunctiva/sclera: Conjunctivae normal.  Pulmonary:     Effort: Pulmonary effort is normal.  Skin:    Comments: Slightly darkened pinpoint, flat lesions that are present scattered throughout bilateral cheeks of face.  No drainage noted.  Neurological:     General: No focal deficit present.     Mental Status: She is alert and oriented to person, place, and time. Mental status is at baseline.  Psychiatric:        Mood and Affect: Mood normal.        Behavior: Behavior normal.        Thought Content: Thought content normal.        Judgment: Judgment normal.      UC Treatments / Results  Labs (all labs ordered are listed, but only abnormal results are displayed) Labs Reviewed - No data to display  EKG   Radiology No results found.  Procedures Procedures (including critical care time)  Medications Ordered in UC Medications - No data to display  Initial Impression / Assessment and Plan / UC Course  I have reviewed the triage vital signs and the nursing notes.  Pertinent labs & imaging results that were available during my care of the patient were reviewed by me and considered in my medical decision making (see chart for details).    Rash to patient's face is not completely  convincing for herpes zoster.  Although, patient reports that this has occurred before and she was diagnosed with shingles.  She also reports improvement with Valtrex after being seen on 04/07/2022.  Rash has mostly improved but is still in the healing phases.  Will treat with another weeks worth of Valtrex to see if it resolves.  Patient advised to follow-up with PCP if it is not getting better after this treatment course.  Patient verbalized understanding and was agreeable with plan. Final Clinical Impressions(s) / UC Diagnoses   Final diagnoses:  Rash and nonspecific skin eruption     Discharge Instructions      You have been prescribed another 1 week course of Valtrex.  I recommend you  follow-up with your primary care doctor if symptoms do not improve with this course.    ED Prescriptions     Medication Sig Dispense Auth. Provider   valACYclovir (VALTREX) 1000 MG tablet Take 1 tablet (1,000 mg total) by mouth 3 (three) times daily. 21 tablet [place], Michele Rockers, [place]      PDMP not reviewed this encounter.   Teodora Medici, [place] 04/19/22 1635

## 2022-04-25 ENCOUNTER — Encounter (HOSPITAL_BASED_OUTPATIENT_CLINIC_OR_DEPARTMENT_OTHER): Payer: Self-pay

## 2022-04-25 ENCOUNTER — Encounter (HOSPITAL_BASED_OUTPATIENT_CLINIC_OR_DEPARTMENT_OTHER): Payer: Medicare Other | Admitting: Advanced Practice Midwife

## 2022-04-27 ENCOUNTER — Ambulatory Visit (AMBULATORY_SURGERY_CENTER): Payer: Medicare Other | Admitting: *Deleted

## 2022-04-27 VITALS — Ht 65.0 in | Wt 200.0 lb

## 2022-04-27 DIAGNOSIS — Z86010 Personal history of colonic polyps: Secondary | ICD-10-CM

## 2022-04-27 MED ORDER — NA SULFATE-K SULFATE-MG SULF 17.5-3.13-1.6 GM/177ML PO SOLN: 1.0000 | ORAL | 0 refills | Status: DC

## 2022-04-27 NOTE — Progress Notes (Signed)
Patient's pre-visit was done today over the phone with the patient. Name,DOB and address verified. Patient denies any allergies to Eggs and Soy. Patient denies any problems with anesthesia/sedation. Patient is not taking any diet pills or blood thinners. No home Oxygen. Insurance confirmed with patient.  Went over prep instructions with patient. Prep instructions sent to pt's MyChart & mailed to pt-pt is aware. Patient understands to call us back with any questions or concerns. Patient is aware of our care-partner policy.

## 2022-05-01 ENCOUNTER — Encounter: Payer: Self-pay | Admitting: Gastroenterology

## 2022-05-04 ENCOUNTER — Encounter: Payer: Medicare Other | Admitting: Internal Medicine

## 2022-05-16 ENCOUNTER — Ambulatory Visit (AMBULATORY_SURGERY_CENTER): Payer: Medicare Other | Admitting: Gastroenterology

## 2022-05-16 ENCOUNTER — Encounter: Payer: Self-pay | Admitting: Gastroenterology

## 2022-05-16 VITALS — BP 130/81 | HR 66 | Temp 97.3°F | Resp 14 | Ht 65.0 in | Wt 200.0 lb

## 2022-05-16 DIAGNOSIS — Z86010 Personal history of colonic polyps: Secondary | ICD-10-CM

## 2022-05-16 DIAGNOSIS — Z09 Encounter for follow-up examination after completed treatment for conditions other than malignant neoplasm: Secondary | ICD-10-CM | POA: Diagnosis not present

## 2022-05-16 MED ORDER — SODIUM CHLORIDE 0.9 % IV SOLN: 500.0000 mL | INTRAVENOUS | Status: DC

## 2022-05-16 NOTE — Progress Notes (Signed)
To pacu, VSS. Report to [email or web address] 

## 2022-05-16 NOTE — Progress Notes (Signed)
Pt's states no medical or surgical changes since previsit or office visit. 

## 2022-05-16 NOTE — Op Note (Addendum)
[facility] Patient Name: Anokhi Shannon Procedure Date: 05/16/2022 11:05 AM MRN: [id number] Endoscopist: Ladene Artist , MD Age: 71 Referring MD:  Date of Birth: [birth year] Gender: Female Account #: [id number] Procedure:                Colonoscopy Indications:              Surveillance: Personal history of adenomatous                            polyps on last colonoscopy 5 years ago Medicines:                Monitored Anesthesia Care Procedure:                Pre-Anesthesia Assessment:                           - Prior to the procedure, a History and Physical                            was performed, and patient medications and                            allergies were reviewed. The patient's tolerance of                            previous anesthesia was also reviewed. The risks                            and benefits of the procedure and the sedation                            options and risks were discussed with the patient.                            All questions were answered, and informed consent                            was obtained. Prior Anticoagulants: The patient has                            taken no previous anticoagulant or antiplatelet                            agents. ASA Grade Assessment: II - A patient with                            mild systemic disease. After reviewing the risks                            and benefits, the patient was deemed in                            satisfactory condition to undergo the procedure.  After obtaining informed consent, the colonoscope                            was passed under direct vision. Throughout the                            procedure, the patient's blood pressure, pulse, and                            oxygen saturations were monitored continuously. The                            Olympus CF-HQ190L ([id number]) Colonoscope was                            introduced through the  anus and advanced to the the                            cecum, identified by appendiceal orifice and                            ileocecal valve. The ileocecal valve, appendiceal                            orifice, and rectum were photographed. The quality                            of the bowel preparation was adequate. The                            colonoscopy was performed without difficulty. The                            patient tolerated the procedure well. Scope In: 11:21:41 AM Scope Out: 11:35:11 AM Scope Withdrawal Time: 0 hours 9 minutes 44 seconds  Total Procedure Duration: 0 hours 13 minutes 30 seconds  Findings:                 The perianal and digital rectal examinations were                            normal.                           A few small-mouthed diverticula were found in the                            left colon. There was no evidence of diverticular                            bleeding.                           The exam was otherwise without abnormality on  direct and retroflexion views. Complications:            No immediate complications. Estimated blood loss:                            None. Estimated Blood Loss:     Estimated blood loss: none. Impression:               - Mild diverticulosis in the left colon.                           - The examination was otherwise normal on direct                            and retroflexion views.                           - No specimens collected. Recommendation:           - Patient has a contact number available for                            emergencies. The signs and symptoms of potential                            delayed complications were discussed with the                            patient. Return to normal activities tomorrow.                            Written discharge instructions were provided to the                            patient.                           - High fiber diet.                            - Continue present medications.                           - No repeat colonoscopy due to age and the absence                            of colonic polyps. Ladene Artist, MD 05/16/2022 11:38:49 AM This report has been signed electronically.

## 2022-05-16 NOTE — Progress Notes (Signed)
History & Physical  Primary Care Physician:  Angelica Pou, MD Primary Gastroenterologist: Lucio Edward, MD  CHIEF COMPLAINT:  Personal history of colon polyps   HPI: Ahniya F Ellwood is a 71 y.o. female with a personal history of adenomatous colon polyps for surveillance colonoscopy.   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   Plantar fasciitis of left foot 10/01/2013   PONV (postoperative nausea and vomiting)    Primary osteoarthritis of left knee 07/11/2021   Right-sided face pain 01/07/2019   Rotator cuff tear 02/02/2017   Sleep apnea 2022   no cpap   Trigger middle finger of right hand 11/12/2018    Past Surgical History:  Procedure Laterality Date   ABDOMINAL HYSTERECTOMY  2007   fibroids, heavy bleeding   ARTERY BIOPSY Right 01/23/2019   Procedure: REMOVAL OF PART OF RIGHT TEMPORAL ARTERY FOR BIOPSY;  Surgeon: Michael Boston, MD;  Location: [facility];  Service: General;  Laterality: Right;   CATARACT EXTRACTION W/ INTRAOCULAR LENS IMPLANT Bilateral    COLONOSCOPY  04/2017   TA, diverticulosis  ([facility])   GLAUCOMA SURGERY     HAMMER TOE SURGERY Bilateral    Right hip replacement     TONSILLECTOMY     TOTAL HIP ARTHROPLASTY Left 2021   TOTAL KNEE ARTHROPLASTY Right 2004   TKR (Dr. Eddie Dibbles with [facility])   TOTAL KNEE ARTHROPLASTY Left 07/11/2021   Procedure: LEFT TOTAL KNEE ARTHROPLASTY;  Surgeon: Leandrew Koyanagi, MD;  Location: [facility];  Service:  Orthopedics;  Laterality: Left;   TOTAL KNEE REVISION Right 03/2014   Dr Al Corpus [facility]   TOTAL SHOULDER ARTHROPLASTY Right 2022    Prior to Admission medications   Medication Sig Start Date End Date Taking? Authorizing Provider  acetaminophen (TYLENOL) 500 MG tablet Take 500 mg by mouth every 6 (six) hours as needed.   Yes [provider]  ALPRAZolam (XANAX) 0.25 MG tablet TAKE 1 TABLET(0.25 MG) BY MOUTH AT BEDTIME AS NEEDED FOR SLEEP 02/16/22  Yes Charise Killian, MD  aspirin EC 81 MG tablet Take 81 mg by mouth daily. Swallow whole.   Yes [provider]  B Complex Vitamins (B COMPLEX PO) Take by mouth.   Yes [provider]  CALCIUM PO Take by mouth.   Yes [provider]  escitalopram (LEXAPRO) 10 MG tablet Take 1 tablet (10 mg total) by mouth daily. 03/21/22 09/17/22 Yes Katsadouros, Vasilios, MD  Ferrous Sulfate (IRON PO) Take by mouth.   Yes [provider]  fexofenadine (ALLEGRA) 180 MG tablet Take 180 mg by mouth daily.   Yes [provider]  glipiZIDE (GLUCOTROL) 10 MG tablet Take 10 mg by mouth daily as needed.   Yes [provider]  lisinopril (ZESTRIL) 40 MG tablet Take 1 tablet (40 mg total) by mouth daily. 02/09/22  Yes  Lacinda Axon, MD  MAGNESIUM PO Take by mouth.   Yes [provider]  omeprazole (PRILOSEC) 20 MG capsule Take 20 mg by mouth daily as needed (heartburn).   Yes [provider]  TRULICITY 1.5 WP/8.0DX SOPN Inject 1.5 mg into the skin every Friday. 03/16/21  Yes [provider]  VYZULTA 0.024 % SOLN Place 1 drop into both eyes at bedtime. 05/16/21  Yes [provider]  zinc gluconate 50 MG tablet Take 50 mg by mouth daily.   Yes [provider]  Accu-Chek FastClix Lancets MISC USE TO TEST BLOOD SUGAR UP TO FOUR TIMES DAILY 11/25/18   Dragnev, Caesar Chestnut, NP  ACCU-CHEK GUIDE test strip USE AS DIRECTED FOUR TIMES DAILY 06/28/20   Biagio Borg, MD  blood glucose  meter kit and supplies KIT Dispense based on patient and insurance preference. Use up to four times daily as directed. DX Code: E11.9 02/11/18   Margot Ables, NP  cycloSPORINE (RESTASIS) 0.05 % ophthalmic emulsion Place 1 drop into both eyes 2 (two) times daily as needed (dry eyes). Patient not taking: Reported on 05/16/2022    [provider]  hydrochlorothiazide (HYDRODIURIL) 25 MG tablet Take 1 tablet (25 mg total) by mouth daily. Patient taking differently: Take 25 mg by mouth daily as needed (swelling). 05/07/20   Tasia Catchings, Amy V, PA-C  hydroquinone 4 % cream Apply to darker areas of skin once a day as needed Patient not taking: Reported on 05/16/2022 07/07/21   Angelica Pou, MD  rosuvastatin (CRESTOR) 5 MG tablet Take 1 tablet (5 mg total) by mouth daily. Patient not taking: Reported on 04/27/2022 01/26/22   Angelica Pou, MD  valACYclovir (VALTREX) 1000 MG tablet Take 1 tablet (1,000 mg total) by mouth 3 (three) times daily. 04/19/22   Teodora Medici, FNP    Current Outpatient Medications  Medication Sig Dispense Refill   acetaminophen (TYLENOL) 500 MG tablet Take 500 mg by mouth every 6 (six) hours as needed.     ALPRAZolam (XANAX) 0.25 MG tablet TAKE 1 TABLET(0.25 MG) BY MOUTH AT BEDTIME AS NEEDED FOR SLEEP 30 tablet 0   aspirin EC 81 MG tablet Take 81 mg by mouth daily. Swallow whole.     B Complex Vitamins (B COMPLEX PO) Take by mouth.     CALCIUM PO Take by mouth.     escitalopram (LEXAPRO) 10 MG tablet Take 1 tablet (10 mg total) by mouth daily. 90 tablet 1   Ferrous Sulfate (IRON PO) Take by mouth.     fexofenadine (ALLEGRA) 180 MG tablet Take 180 mg by mouth daily.     glipiZIDE (GLUCOTROL) 10 MG tablet Take 10 mg by mouth daily as needed.     lisinopril (ZESTRIL) 40 MG tablet Take 1 tablet (40 mg total) by mouth daily. 30 tablet 5   MAGNESIUM PO Take by mouth.     omeprazole (PRILOSEC) 20 MG capsule Take 20 mg by mouth daily as needed (heartburn).      TRULICITY 1.5 IP/3.8SN SOPN Inject 1.5 mg into the skin every Friday.     VYZULTA 0.024 % SOLN Place 1 drop into both eyes at bedtime.     zinc gluconate 50 MG tablet Take 50 mg by mouth daily.     Accu-Chek FastClix Lancets MISC USE TO TEST BLOOD SUGAR UP TO FOUR TIMES DAILY 306 each 1   ACCU-CHEK GUIDE test strip USE AS DIRECTED FOUR TIMES DAILY 100 strip 11  blood glucose meter kit and supplies KIT Dispense based on patient and insurance preference. Use up to four times daily as directed. DX Code: E11.9 1 each 0   cycloSPORINE (RESTASIS) 0.05 % ophthalmic emulsion Place 1 drop into both eyes 2 (two) times daily as needed (dry eyes). (Patient not taking: Reported on 05/16/2022)     hydrochlorothiazide (HYDRODIURIL) 25 MG tablet Take 1 tablet (25 mg total) by mouth daily. (Patient taking differently: Take 25 mg by mouth daily as needed (swelling).) 30 tablet 0   hydroquinone 4 % cream Apply to darker areas of skin once a day as needed (Patient not taking: Reported on 05/16/2022) 28.35 g 3   rosuvastatin (CRESTOR) 5 MG tablet Take 1 tablet (5 mg total) by mouth daily. (Patient not taking: Reported on 04/27/2022) 30 tablet 5   valACYclovir (VALTREX) 1000 MG tablet Take 1 tablet (1,000 mg total) by mouth 3 (three) times daily. 21 tablet 0   Current Facility-Administered Medications  Medication Dose Route Frequency Provider Last Rate Last Admin   0.9 %  sodium chloride infusion  500 mL Intravenous Continuous Ladene Artist, MD        Allergies as of 05/16/2022 - Review Complete 05/16/2022  Allergen Reaction Noted   Codeine Other (See Comments) 10/24/2011   Metformin and related Nausea Only 08/05/2014   Oxycodone Nausea And Vomiting 04/21/2014    Family History  Problem Relation Age of Onset   Cancer Maternal Grandmother 69       ovarian   Diabetes Maternal Grandmother    Hypertension Maternal Grandmother    Cancer Mother 69       bone, MM   Stroke Other        unsure who   CAD Neg Hx     Colon cancer Neg Hx    Esophageal cancer Neg Hx    Rectal cancer Neg Hx    Stomach cancer Neg Hx     Social History   Socioeconomic History   Marital status: Single    Spouse name: Not on file   Number of children: 0   Years of education: 16   Highest education level: Not on file  Occupational History   Occupation: work with disabilities/ Retired    Fish farm manager: Oceanographer  Tobacco Use   Smoking status: Never   Smokeless tobacco: Never  Vaping Use   Vaping Use: Never used  Substance and Sexual Activity   Alcohol use: No   Drug use: No   Sexual activity: Not Currently    Birth control/protection: Surgical  Other Topics Concern   Not on file  Social History Narrative   Caffeine: rare   Lives alone   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[employer]   Fun/Hobbies: Gardening    Social Determinants of Health   Financial Resource Strain: Low Risk  (12/01/2021)   Overall Financial Resource Strain (CARDIA)    Difficulty of Paying Living Expenses: Not hard at all  Food Insecurity: No Food Insecurity (12/01/2021)   Hunger Vital Sign    Worried About Running Out of Food in the Last Year: Never true    [place] in the Last Year: Never true  Transportation Needs: No Transportation Needs (12/01/2021)   PRAPARE - Hydrologist (Medical): No    Lack of Transportation (Non-Medical): No  Physical Activity: Inactive (12/01/2021)   Exercise Vital Sign    Days of Exercise per Week: 0 days  Minutes of Exercise per Session: 0 min  Stress: Stress Concern Present (12/01/2021)   [place]    Feeling of Stress : Rather much  Social Connections: Moderately Integrated (12/01/2021)   Social Connection and Isolation Panel [NHANES]    Frequency of Communication with Friends and Family: Twice a week    Frequency of Social Gatherings with Friends and Family: Twice a week    Attends  Religious Services: 1 to 4 times per year    Active Member of [employer] or Organizations: Yes    Attends Archivist Meetings: Never    Marital Status: Never married  Intimate Partner Violence: Not At Risk (12/01/2021)   Humiliation, Afraid, Rape, and Kick questionnaire    Fear of Current or Ex-Partner: No    Emotionally Abused: No    Physically Abused: No    Sexually Abused: No    Review of Systems:  All systems reviewed were negative except where noted in HPI.   Physical Exam: General:  Alert, well-developed, in NAD Head:  Normocephalic and atraumatic. Eyes:  Sclera clear, no icterus.   Conjunctiva pink. Ears:  Normal auditory acuity. Mouth:  No deformity or lesions.  Neck:  Supple; no masses . Lungs:  Clear throughout to auscultation.   No wheezes, crackles, or rhonchi. No acute distress. Heart:  Regular rate and rhythm; no murmurs. Abdomen:  Soft, nondistended, nontender. No masses, hepatomegaly. No obvious masses.  Normal bowel .    Rectal:  Deferred   Msk:  Symmetrical without gross deformities.. Pulses:  Normal pulses noted. Extremities:  Without edema. Neurologic:  Alert and  oriented x4;  grossly normal neurologically. Skin:  Intact without significant lesions or rashes. Cervical Nodes:  No significant cervical adenopathy. Psych:  Alert and cooperative. Normal mood and affect.   Impression / Plan:   Personal history of adenomatous colon polyps for surveillance colonoscopy.  Pricilla Riffle. Fuller Plan  05/16/2022, 11:09 AM See Shea Evans, [facility], to contact our on call provider

## 2022-05-16 NOTE — Patient Instructions (Signed)
Information on diverticulosis given to you today.  Resume previous diet and medications.  No repeat colonoscopy due to age and absence of colonic polyps.   YOU HAD AN ENDOSCOPIC PROCEDURE TODAY AT [facility]:   Refer to the procedure report that was given to you for any specific questions about what was found during the examination.  If the procedure report does not answer your questions, please call your gastroenterologist to clarify.  If you requested that your care partner not be given the details of your procedure findings, then the procedure report has been included in a sealed envelope for you to review at your convenience later.  YOU SHOULD EXPECT: Some feelings of bloating in the abdomen. Passage of more gas than usual.  Walking can help get rid of the air that was put into your GI tract during the procedure and reduce the bloating. If you had a lower endoscopy (such as a colonoscopy or flexible sigmoidoscopy) you may notice spotting of blood in your stool or on the toilet paper. If you underwent a bowel prep for your procedure, you may not have a normal bowel movement for a few days.  Please Note:  You might notice some irritation and congestion in your nose or some drainage.  This is from the oxygen used during your procedure.  There is no need for concern and it should clear up in a day or so.  SYMPTOMS TO REPORT IMMEDIATELY:  Following lower endoscopy (colonoscopy or flexible sigmoidoscopy):  Excessive amounts of blood in the stool  Significant tenderness or worsening of abdominal pains  Swelling of the abdomen that is new, acute  Fever of 100F or higher   For urgent or emergent issues, a gastroenterologist can be reached at any hour by calling [phone]. Do not use MyChart messaging for urgent concerns.    DIET:  We do recommend a small meal at first, but then you may proceed to your regular diet.  Drink plenty of fluids but you should avoid alcoholic  beverages for 24 hours.  ACTIVITY:  You should plan to take it easy for the rest of today and you should NOT DRIVE or use heavy machinery until tomorrow (because of the sedation medicines used during the test).    FOLLOW UP: Our staff will call the number listed on your records the next business day following your procedure.  We will call around 7:15- 8:00 am to check on you and address any questions or concerns that you may have regarding the information given to you following your procedure. If we do not reach you, we will leave a message.     If any biopsies were taken you will be contacted by phone or by letter within the next 1-3 weeks.  Please call us at [phone] if you have not heard about the biopsies in 3 weeks.    SIGNATURES/CONFIDENTIALITY: You and/or your care partner have signed paperwork which will be entered into your electronic medical record.  These signatures attest to the fact that that the information above on your After Visit Summary has been reviewed and is understood.  Full responsibility of the confidentiality of this discharge information lies with you and/or your care-partner.

## 2022-05-17 ENCOUNTER — Telehealth: Payer: Self-pay | Admitting: *Deleted

## 2022-05-17 NOTE — Telephone Encounter (Signed)
Left message on f/u call 

## 2022-05-18 ENCOUNTER — Encounter: Payer: Medicare Other | Admitting: Internal Medicine

## 2022-05-19 ENCOUNTER — Encounter: Payer: Self-pay | Admitting: *Deleted

## 2022-05-19 NOTE — Progress Notes (Signed)
[facility] Quality Team Note  Name: DAJIA GUNNELS Date of Birth: 1950/11/19 MRN: [id number] Date: 05/19/2022  [facility] Quality Team has reviewed this patient's chart, please see recommendations below:  [facility] Quality Other; (Pt has open gap for KED measure.  Pt would need urine albumin creatinine ratio test ordered and completed to close gap.)

## 2022-06-02 ENCOUNTER — Ambulatory Visit
Admission: EM | Admit: 2022-06-02 | Discharge: 2022-06-02 | Disposition: A | Discharge status: Home / Self Care | Payer: Medicare Other | Attending: Physician Assistant | Admitting: Physician Assistant

## 2022-06-02 DIAGNOSIS — B009 Herpesviral infection, unspecified: Secondary | ICD-10-CM | POA: Diagnosis not present

## 2022-06-02 MED ORDER — VALACYCLOVIR HCL 1 G PO TABS: 1000.0000 mg | ORAL_TABLET | Freq: Three times a day (TID) | ORAL | 0 refills | Status: DC

## 2022-06-02 NOTE — ED Triage Notes (Signed)
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

## 2022-06-02 NOTE — ED Provider Notes (Signed)
[name]    CSN: [id number] Arrival date & time: 06/02/22  1635      History   Chief Complaint Chief Complaint  Patient presents with   Outbreak    HPI [facility] [name] is a 71 y.o. female.   Patient here today for evaluation of suspected herpes simplex outbreak to her genital area.  She reports that she has not had an outbreak in this area since she was in her 42s.  Patient is not currently sexually active.  She has not had any treatment for symptoms.  The history is provided by the patient.    Past Medical History:  Diagnosis Date   Allergy    Anxiety    Arthritis    s/p R TKR   Cyst of left kidney 2015   by lumbar MRI pending renal US   DDD (degenerative disc disease), lumbar 03/2014   severe L2-3 with L HNP with L2/3 nerve root impingement [name])   Depression with anxiety    Diabetes type 2, controlled ([place]) 2011   borderline   Glaucoma    History of ulcer disease    HLD (hyperlipidemia)    no meds taken   Hypertension    OAB (overactive bladder) 10/14/2019   Osteoarthritis of spine with radiculopathy, 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   Plantar fasciitis of left foot 10/01/2013   PONV (postoperative nausea and vomiting)    Primary osteoarthritis of left knee 07/11/2021   Right-sided face pain 01/07/2019   Rotator cuff tear 02/02/2017   Sleep apnea 2022   no cpap   Trigger middle finger of right hand 11/12/2018    Patient Active Problem List   Diagnosis Date Noted   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   Pelvic pain 02/12/2022   Rash 02/12/2022   Decreased functional mobility 02/01/2022   Personal history of colonic polyps 01/26/2022   History of total knee replacement, bilateral 07/11/2021   Sleep apnea, intolerant of CPAP, declines 05/24/2021   Healthcare maintenance 01/07/2019   Spondylosis of cervical region without myelopathy or radiculopathy 01/14/2018   Chronic left shoulder pain 01/08/2018   History of bilateral hip replacements  07/19/2017   Acquired renal cyst of left kidney 01/01/2016   DDD (degenerative disc disease), lumbar 03/04/2014   Obesity 11/13/2012   Glaucoma    Type 2 diabetes mellitus, controlled ([place])    Major depressive disorder, recurrent episode with anxious distress ([place])    Vitreous degeneration, unspecified eye 10/25/2012   HTN (hypertension) 01/06/2012   GERD (gastroesophageal reflux disease) 01/06/2012   Hypercholesteremia 01/06/2012    Past Surgical History:  Procedure Laterality Date   ABDOMINAL HYSTERECTOMY  2007   fibroids, heavy bleeding   ARTERY BIOPSY Right 01/23/2019   Procedure: REMOVAL OF PART OF RIGHT TEMPORAL ARTERY FOR BIOPSY;  Surgeon: Michael Boston, MD;  Location: [facility];  Service: General;  Laterality: Right;   CATARACT EXTRACTION W/ INTRAOCULAR LENS IMPLANT Bilateral    COLONOSCOPY  04/2017   TA, diverticulosis  ([place])   GLAUCOMA SURGERY     HAMMER TOE SURGERY Bilateral    Right hip replacement     TONSILLECTOMY     TOTAL HIP ARTHROPLASTY Left 2021   TOTAL KNEE ARTHROPLASTY Right 2004   TKR (Dr. Eddie Dibbles with [facility])   TOTAL KNEE ARTHROPLASTY Left 07/11/2021   Procedure: LEFT TOTAL KNEE ARTHROPLASTY;  Surgeon: Leandrew Koyanagi, MD;  Location: [facility];  Service: Orthopedics;  Laterality: Left;   TOTAL KNEE REVISION Right 03/2014  Dr Al Corpus [facility]   TOTAL SHOULDER ARTHROPLASTY Right 2022    OB History     Gravida  0   Para      Term      Preterm      AB      Living  0      SAB      IAB      Ectopic      Multiple      Live Births               Home Medications    Prior to Admission medications   Medication Sig Start Date End Date Taking? Authorizing Provider  Accu-Chek FastClix Lancets MISC USE TO TEST BLOOD SUGAR UP TO FOUR TIMES DAILY 11/25/18   Dragnev, Caesar Chestnut, NP  ACCU-CHEK GUIDE test strip USE AS DIRECTED FOUR TIMES DAILY 06/28/20   Biagio Borg, MD  acetaminophen (TYLENOL) 500 MG tablet Take 500 mg by mouth every 6 (six)  hours as needed.    [provider]  ALPRAZolam Duanne Moron) 0.25 MG tablet TAKE 1 TABLET(0.25 MG) BY MOUTH AT BEDTIME AS NEEDED FOR SLEEP 02/16/22   Charise Killian, MD  aspirin EC 81 MG tablet Take 81 mg by mouth daily. Swallow whole.    [provider]  B Complex Vitamins (B COMPLEX PO) Take by mouth.    [provider]  blood glucose meter kit and supplies KIT Dispense based on patient and insurance preference. Use up to four times daily as directed. DX Code: E11.9 02/11/18   Dragnev, Caesar Chestnut, NP  CALCIUM PO Take by mouth.    [provider]  cycloSPORINE (RESTASIS) 0.05 % ophthalmic emulsion Place 1 drop into both eyes 2 (two) times daily as needed (dry eyes). Patient not taking: Reported on 05/16/2022    [provider]  escitalopram (LEXAPRO) 10 MG tablet Take 1 tablet (10 mg total) by mouth daily. 03/21/22 09/17/22  Riesa Pope, MD  Ferrous Sulfate (IRON PO) Take by mouth.    [provider]  fexofenadine (ALLEGRA) 180 MG tablet Take 180 mg by mouth daily.    [provider]  glipiZIDE (GLUCOTROL) 10 MG tablet Take 10 mg by mouth daily as needed.    [provider]  hydrochlorothiazide (HYDRODIURIL) 25 MG tablet Take 1 tablet (25 mg total) by mouth daily. Patient taking differently: Take 25 mg by mouth daily as needed (swelling). 05/07/20   Tasia Catchings, Amy V, PA-C  hydroquinone 4 % cream Apply to darker areas of skin once a day as needed Patient not taking: Reported on 05/16/2022 07/07/21   Angelica Pou, MD  lisinopril (ZESTRIL) 40 MG tablet Take 1 tablet (40 mg total) by mouth daily. 02/09/22   Lacinda Axon, MD  MAGNESIUM PO Take by mouth.    [provider]  omeprazole (PRILOSEC) 20 MG capsule Take 20 mg by mouth daily as needed (heartburn).    [provider]  rosuvastatin (CRESTOR) 5 MG tablet Take 1 tablet (5 mg total) by mouth daily. Patient not taking: Reported on 04/27/2022 01/26/22    Angelica Pou, MD  TRULICITY 1.5 KG/8.1EH SOPN Inject 1.5 mg into the skin every Friday. 03/16/21   [provider]  valACYclovir (VALTREX) 1000 MG tablet Take 1 tablet (1,000 mg total) by mouth 3 (three) times daily. 06/02/22   Francene Finders, PA-C  VYZULTA 0.024 % SOLN Place 1 drop into both eyes at bedtime. 05/16/21   [provider]  zinc gluconate 50 MG tablet Take 50 mg by mouth daily.    [provider]    Family History Family History  Problem Relation Age of Onset   Cancer Maternal Grandmother 38       ovarian   Diabetes Maternal Grandmother    Hypertension Maternal Grandmother    Cancer Mother 22       bone, MM   Stroke Other        unsure who   CAD Neg Hx    Colon cancer Neg Hx    Esophageal cancer Neg Hx    Rectal cancer Neg Hx    Stomach cancer Neg Hx     Social History Social History   Tobacco Use   Smoking status: Never   Smokeless tobacco: Never  Vaping Use   Vaping Use: Never used  Substance Use Topics   Alcohol use: No   Drug use: No     Allergies   Codeine, Metformin and related, and Oxycodone   Review of Systems Review of Systems  Constitutional:  Negative for chills and fever.  Eyes:  Negative for discharge and redness.  Gastrointestinal:  Negative for nausea and vomiting.  Genitourinary:  Positive for genital sores.     Physical Exam Triage Vital Signs ED Triage Vitals  Enc Vitals Group     BP 06/02/22 1738 (!) 154/100     Pulse Rate 06/02/22 1738 64     Resp 06/02/22 1738 16     Temp 06/02/22 1738 98.5 F (36.9 C)     Temp Source 06/02/22 1738 Oral     SpO2 06/02/22 1738 98 %     Weight --      Height --      Head Circumference --      Peak Flow --      Pain Score 06/02/22 1740 6     Pain Loc --      Pain Edu? --      Excl. in [place]? --    No data found.  Updated Vital Signs BP (!) 154/100 (BP Location: Right Arm)   Pulse 64   Temp 98.5 F (36.9 C) (Oral)   Resp 16   SpO2 98%       Physical Exam Vitals and nursing note reviewed.  Constitutional:      General: She is not in acute distress.    Appearance: Normal appearance. She is not ill-appearing.  HENT:     Head: Normocephalic and atraumatic.  Eyes:     Conjunctiva/sclera: Conjunctivae normal.  Cardiovascular:     Rate and Rhythm: Normal rate.  Pulmonary:     Effort: Pulmonary effort is normal.  Genitourinary:    Comments: Diffuse mildly erythematous vesicular lesions noted to left side genital area, inner thigh Neurological:     Mental Status: She is alert.  Psychiatric:        Mood and Affect: Mood normal.        Behavior: Behavior normal.        Thought Content: Thought content normal.      UC Treatments / Results  Labs (all labs ordered are listed, but only abnormal results are displayed) Labs Reviewed - No data to display  EKG   Radiology No results found.  Procedures Procedures (including critical care time)  Medications Ordered in UC Medications - No data to display  Initial Impression / Assessment and Plan / UC Course  I have reviewed the triage vital signs  and the nursing notes.  Pertinent labs & imaging results that were available during my care of the patient were reviewed by me and considered in my medical decision making (see chart for details).    Rash consistent with herpetic outbreak. Valtrex prescribed.  Recommended follow-up if no gradual improvement or with any further concerns.  Final Clinical Impressions(s) / UC Diagnoses   Final diagnoses:  Herpes simplex   Discharge Instructions   None    ED Prescriptions     Medication Sig Dispense Auth. Provider   valACYclovir (VALTREX) 1000 MG tablet Take 1 tablet (1,000 mg total) by mouth 3 (three) times daily. 21 tablet Francene Finders, PA-C      PDMP not reviewed this encounter.   Francene Finders, PA-C 06/02/22 1810

## 2022-06-15 NOTE — Telephone Encounter (Signed)
Done

## 2022-06-21 ENCOUNTER — Ambulatory Visit: Payer: Medicare Other | Admitting: Allergy

## 2022-06-26 ENCOUNTER — Other Ambulatory Visit: Payer: Self-pay

## 2022-06-26 DIAGNOSIS — I10 Essential (primary) hypertension: Secondary | ICD-10-CM

## 2022-06-27 MED ORDER — LISINOPRIL 40 MG PO TABS: 40.0000 mg | ORAL_TABLET | Freq: Every day | ORAL | 5 refills | Status: DC

## 2022-06-28 ENCOUNTER — Other Ambulatory Visit: Payer: Self-pay

## 2022-06-28 DIAGNOSIS — F32A Depression, unspecified: Secondary | ICD-10-CM

## 2022-06-29 ENCOUNTER — Encounter: Payer: Medicare Other | Admitting: Internal Medicine

## 2022-06-30 ENCOUNTER — Other Ambulatory Visit: Payer: Self-pay | Admitting: Internal Medicine

## 2022-06-30 MED ORDER — ALPRAZOLAM 0.25 MG PO TABS: ORAL_TABLET | ORAL | 0 refills | Status: DC

## 2022-07-21 ENCOUNTER — Encounter: Payer: Medicare Other | Admitting: Internal Medicine

## 2022-07-25 ENCOUNTER — Other Ambulatory Visit: Payer: Self-pay | Admitting: Internal Medicine

## 2022-07-25 ENCOUNTER — Other Ambulatory Visit: Payer: Self-pay

## 2022-07-25 ENCOUNTER — Encounter: Payer: Self-pay | Admitting: *Deleted

## 2022-07-25 MED ORDER — TRULICITY 1.5 MG/0.5ML ~~LOC~~ SOAJ: 1.5000 mg | SUBCUTANEOUS | 3 refills | Status: DC

## 2022-07-25 NOTE — Progress Notes (Signed)
[facility] Quality Team Note  Name: [facility] [name] Date of Birth: 1951-08-06 MRN: [id number] Date: 07/25/2022  [facility] Quality Team has reviewed this patient's chart, please see recommendations below:  [facility] Quality Other; (Pt has open gap for A1C.  Pt has upcoming appt 08/18/2022.)

## 2022-07-26 ENCOUNTER — Other Ambulatory Visit: Payer: Self-pay

## 2022-07-26 DIAGNOSIS — F419 Anxiety disorder, unspecified: Secondary | ICD-10-CM

## 2022-07-31 MED ORDER — ALPRAZOLAM 0.25 MG PO TABS: ORAL_TABLET | ORAL | 0 refills | Status: DC

## 2022-08-02 ENCOUNTER — Other Ambulatory Visit: Payer: Self-pay

## 2022-08-02 DIAGNOSIS — E7800 Pure hypercholesterolemia, unspecified: Secondary | ICD-10-CM

## 2022-08-02 MED ORDER — ROSUVASTATIN CALCIUM 5 MG PO TABS: 5.0000 mg | ORAL_TABLET | Freq: Every day | ORAL | 3 refills | Status: DC

## 2022-08-06 ENCOUNTER — Telehealth: Payer: Self-pay | Admitting: Student

## 2022-08-06 NOTE — Telephone Encounter (Signed)
Received a page to call patient back.  Patient reported right shoulder and neck pain after lifting heavy last week.  Pain has been going on for 1 week.  Patient reports shoulder pain with neck muscle tightness, especially with rotating her head towards her right.  Patient has been taking Tylenol 325 mg every 6 hours with minimal relief.  Patient reports history of right shoulder replacement on that side as well.  Advised patient to try NSAIDs such as naproxen for pain.  She can also use heat to help with muscle tightness.  I messaged the front desk to schedule her an appointment with our clinic as soon as possible for evaluation.  Patient also have an appointment with her PCP Dr. Jimmye Norman on 12/14 which I encouraged her to keep.  All questions were answered.

## 2022-08-07 ENCOUNTER — Telehealth: Payer: Self-pay | Admitting: Internal Medicine

## 2022-08-07 NOTE — Telephone Encounter (Signed)
-----   Message from Gaylan Gerold, DO sent at 08/06/2022  8:42 AM EST ----- Good morning,   Can you please schedule an appointment for this patient soon as possible for her right shoulder pain?  Thank you,  Alease Frame

## 2022-08-07 NOTE — Telephone Encounter (Signed)
Please refer to message below.  Just spoke with patient to schedule future appointment per Dr. Alfonse Spruce.  Patient states that she is doing much better after taking his recommendations on what to do for the pain.  She will wait to see Dr. Jimmye Norman at appointment date 08/17/22 at 10:45 am.

## 2022-08-08 NOTE — Progress Notes (Signed)
[facility] Quality Team Note  Name: [facility] [name] Date of Birth: 1951-02-16 MRN: [id number] Date: 08/08/2022  [facility] Quality Team has reviewed this patient's chart, please see recommendations below:  [facility] Quality Other; (KED GAP- PATIENT HAS eGFR BUT NEEDS URINE MICROALBUMIN/CREATININE RATIO COMPLETED BEFORE 2024 TO CLOSE THE GAP. PATIENT HAS UPCOMING APPT ON 12/14 )

## 2022-08-10 ENCOUNTER — Ambulatory Visit: Payer: Medicare Other | Admitting: Allergy

## 2022-08-17 ENCOUNTER — Encounter: Payer: Medicare Other | Admitting: Internal Medicine

## 2022-08-18 ENCOUNTER — Ambulatory Visit (HOSPITAL_BASED_OUTPATIENT_CLINIC_OR_DEPARTMENT_OTHER): Payer: Medicare Other | Admitting: Orthopaedic Surgery

## 2022-08-18 ENCOUNTER — Encounter: Payer: Medicare Other | Admitting: Internal Medicine

## 2022-08-22 IMAGING — CT CT SHOULDER*R* W/CM
3 of 7 series · 9 of 33 positions shown, 10 images · IV contrast (OMNIPAQUE 300)
Comparison: Right shoulder x-rays dated October 30, 2018.

CLINICAL DATA: Right shoulder rash due to adhesive related to
recent shoulder replacement surgery 11 days ago.

EXAM:
CT OF THE UPPER RIGHT EXTREMITY WITH CONTRAST
TECHNIQUE: Multidetector CT imaging of the right shoulder was performed
according to the standard protocol following intravenous contrast
administration.
CONTRAST:  100mL OMNIPAQUE IOHEXOL 300 MG/ML  SOLN

[Series 7: cor st · coronal · 0.43mm/px · 1 of 101 slices shown]
[im 51/101  bone]
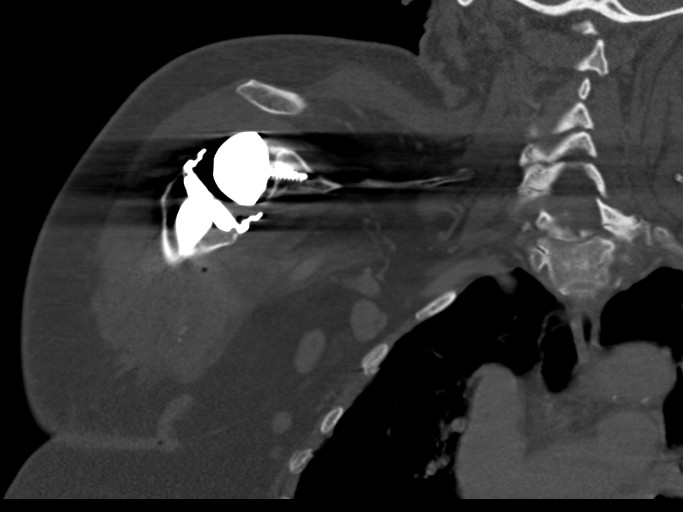

[Series 8: sag st · sagittal · 0.39mm/px · 5 of 148 slices shown]
[im 25/148  bone]
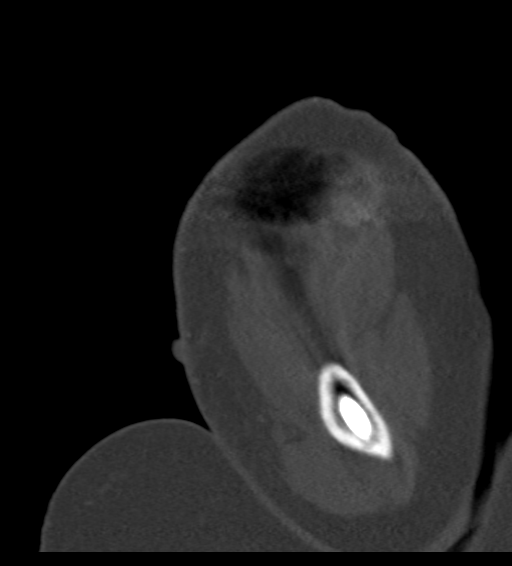
[im 50/148  bone]
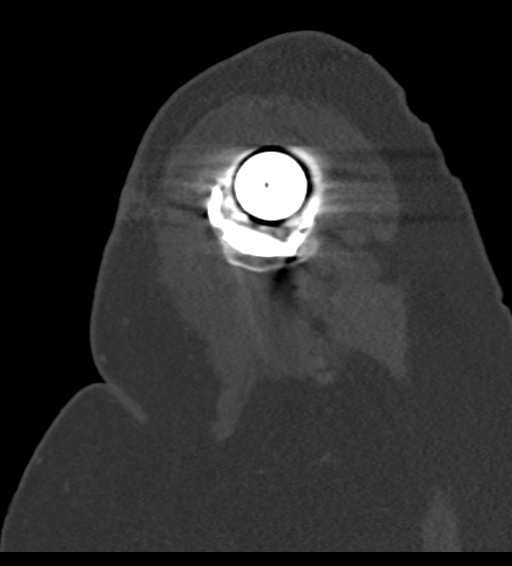
[im 74/148  bone]
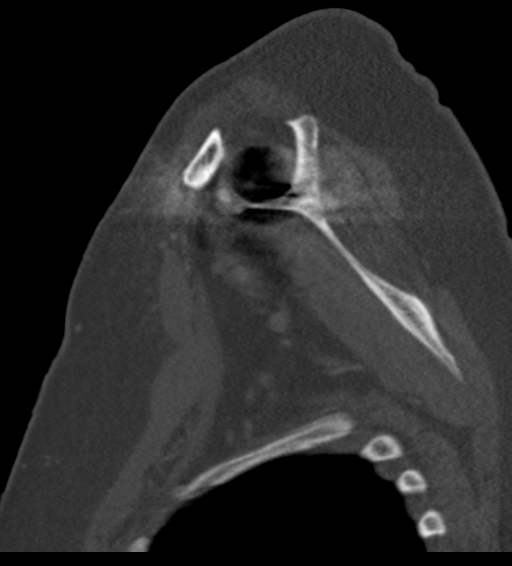
[im 99/148  bone]
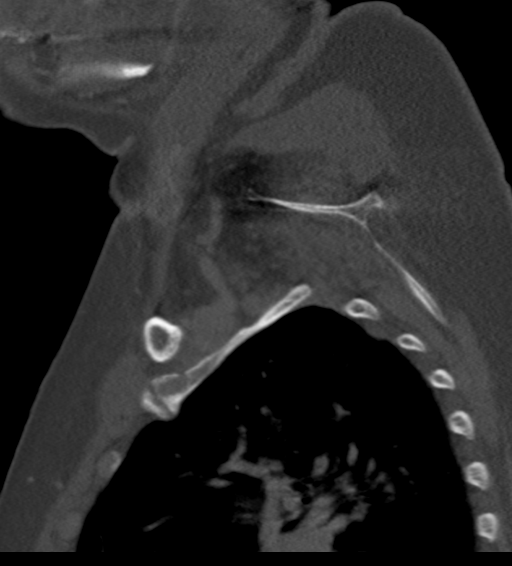
[im 123/148  bone]
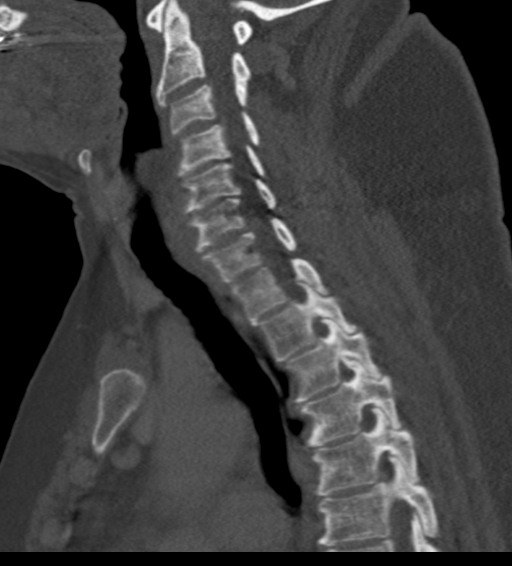

[Series 603: st axial · axial · 0.58mm/px · z∈[+893,+1139]mm · 3 of 124 slices shown, 4 images]
[im 1/124  soft-tissue]
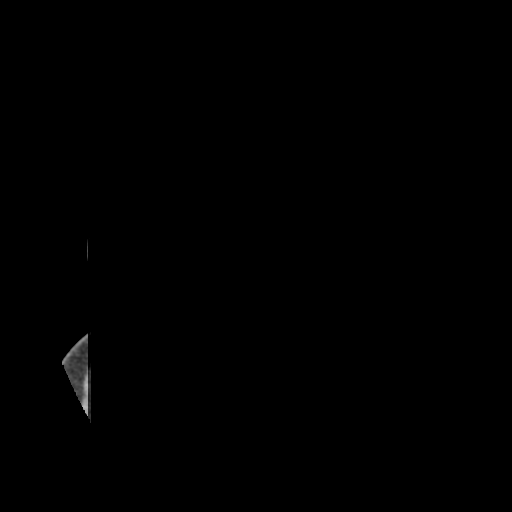
[im 1/124  bone]
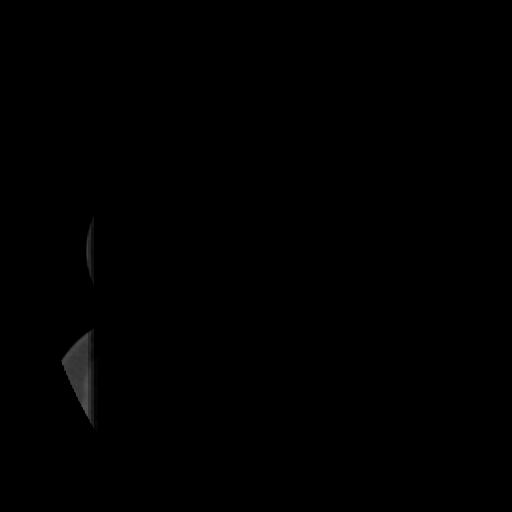
[im 62/124  bone]
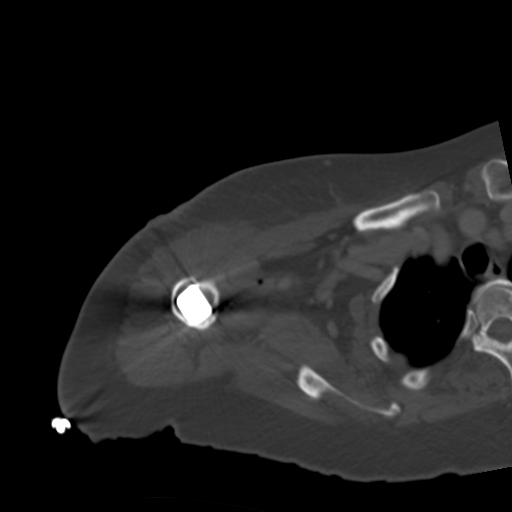
[im 124/124  bone]
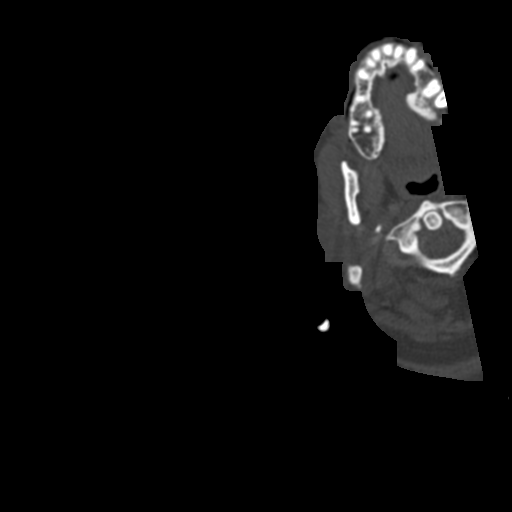

[9 of 33 positions shown; findings below may reference images not displayed]

FINDINGS: Bones/Joint/Cartilage

Prior right reverse total shoulder arthroplasty. No evidence of
hardware failure or loosening. No fracture or dislocation. Distal
clavicle resection. No joint effusion.

Ligaments

Ligaments are suboptimally evaluated by CT.

Muscles and Tendons
Grossly intact.  Mild infraspinatus muscle atrophy.

Soft tissue
Small skin blister over the lateral upper shoulder. Few additional
smaller skin blisters anteriorly over the incision. Few scattered
foci of subcutaneous air about the right shoulder, likely
postsurgical. No fluid collection or hematoma. No soft tissue mass.
Prominent subcentimeter right axillary lymph nodes are likely
reactive.
IMPRESSION: 1. Prior right reverse total shoulder arthroplasty without evidence
of hardware complication. No acute osseous abnormality.
2. Small skin blisters over the shoulder. No evidence of deep
infection or abscess.

## 2022-08-30 ENCOUNTER — Other Ambulatory Visit: Payer: Self-pay

## 2022-08-30 DIAGNOSIS — F419 Anxiety disorder, unspecified: Secondary | ICD-10-CM

## 2022-08-30 MED ORDER — ALPRAZOLAM 0.25 MG PO TABS: ORAL_TABLET | ORAL | 0 refills | Status: DC

## 2022-09-06 ENCOUNTER — Ambulatory Visit (HOSPITAL_BASED_OUTPATIENT_CLINIC_OR_DEPARTMENT_OTHER): Payer: Medicare Other | Admitting: Orthopaedic Surgery

## 2022-09-14 ENCOUNTER — Other Ambulatory Visit: Payer: Self-pay | Admitting: Student

## 2022-10-03 ENCOUNTER — Other Ambulatory Visit: Payer: Self-pay

## 2022-10-03 DIAGNOSIS — F32A Depression, unspecified: Secondary | ICD-10-CM

## 2022-10-03 MED ORDER — ALPRAZOLAM 0.25 MG PO TABS: ORAL_TABLET | ORAL | 0 refills | Status: DC

## 2022-10-12 ENCOUNTER — Encounter: Payer: Self-pay | Admitting: Internal Medicine

## 2022-10-12 ENCOUNTER — Ambulatory Visit (INDEPENDENT_AMBULATORY_CARE_PROVIDER_SITE_OTHER): Payer: 59 | Admitting: Internal Medicine

## 2022-10-12 VITALS — BP 190/99 | HR 64 | Temp 97.9°F | Ht 65.0 in | Wt 202.9 lb

## 2022-10-12 DIAGNOSIS — E119 Type 2 diabetes mellitus without complications: Secondary | ICD-10-CM

## 2022-10-12 DIAGNOSIS — F419 Anxiety disorder, unspecified: Secondary | ICD-10-CM

## 2022-10-12 DIAGNOSIS — Z860100 Personal history of colon polyps, unspecified: Secondary | ICD-10-CM

## 2022-10-12 DIAGNOSIS — D649 Anemia, unspecified: Secondary | ICD-10-CM | POA: Diagnosis not present

## 2022-10-12 DIAGNOSIS — M8580 Other specified disorders of bone density and structure, unspecified site: Secondary | ICD-10-CM

## 2022-10-12 DIAGNOSIS — F5104 Psychophysiologic insomnia: Secondary | ICD-10-CM

## 2022-10-12 DIAGNOSIS — E7800 Pure hypercholesterolemia, unspecified: Secondary | ICD-10-CM | POA: Diagnosis not present

## 2022-10-12 DIAGNOSIS — E559 Vitamin D deficiency, unspecified: Secondary | ICD-10-CM | POA: Diagnosis not present

## 2022-10-12 DIAGNOSIS — Z0000 Encounter for general adult medical examination without abnormal findings: Secondary | ICD-10-CM

## 2022-10-12 DIAGNOSIS — M47816 Spondylosis without myelopathy or radiculopathy, lumbar region: Secondary | ICD-10-CM | POA: Diagnosis not present

## 2022-10-12 DIAGNOSIS — I10 Essential (primary) hypertension: Secondary | ICD-10-CM | POA: Diagnosis not present

## 2022-10-12 DIAGNOSIS — Z780 Asymptomatic menopausal state: Secondary | ICD-10-CM | POA: Diagnosis not present

## 2022-10-12 DIAGNOSIS — B009 Herpesviral infection, unspecified: Secondary | ICD-10-CM

## 2022-10-12 DIAGNOSIS — F339 Major depressive disorder, recurrent, unspecified: Secondary | ICD-10-CM

## 2022-10-12 DIAGNOSIS — D1039 Benign neoplasm of other parts of mouth: Secondary | ICD-10-CM

## 2022-10-12 DIAGNOSIS — M25512 Pain in left shoulder: Secondary | ICD-10-CM | POA: Diagnosis not present

## 2022-10-12 DIAGNOSIS — G8929 Other chronic pain: Secondary | ICD-10-CM

## 2022-10-12 DIAGNOSIS — K219 Gastro-esophageal reflux disease without esophagitis: Secondary | ICD-10-CM

## 2022-10-12 LAB — POCT GLYCOSYLATED HEMOGLOBIN (HGB A1C): Hemoglobin A1C: 5.5 % (ref 4.0–5.6)

## 2022-10-12 LAB — GLUCOSE, CAPILLARY: Glucose-Capillary: 81 mg/dL (ref 70–99)

## 2022-10-12 NOTE — Progress Notes (Signed)
72 yo Ms. Stinnett presents for routine f/u of chronic conditions; I last saw her in 01/2022.  In the interim have been several cancelled appointments due to scheduling conflicts.  Her medication refills have been maintained.  Today she reports that she's been doing fairly well, though continues to have chronic orthopedic challenges.  She also reports about 6 M of feeling positional dizziness - she splits her 40 mg lisipinopril in 1/2 and takes 20 mg bid which may have helped a bit.  Doesn't take HCTZ 25 mg frequently because it seems to cause dizziness; she takes it prn for LE edema.    "I know I need to walk". Enjoys being active, feels tired but then feels physically better after recovering from the exertion. She does have a [place] [facility] card for access to [employer].   [employer] [facility] call staff came before christmas and checked urine, A1c, bp, temp, cognition testing. We have no record of this.   Wants to work and is limited by her worries about talking non-stop, which has been pointed out to her by others.  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    Agrees to see [employer].  She has also noted episodes of constantly moving mouth and lips- this isn't occurring today.  Changing to [place] and will need new referral in network for a back surgeon to continue lumbar spinal nerve ablation which she has had done annually through [facility] for the past 3 years.   Needs referral to a oral surgeon in network for management of large palatine tori.   Physical exam: BP (!) 190/99 (BP Location: Right Arm, Patient Position: Sitting, Cuff Size: Small)   Pulse 64   Temp 97.9 F (36.6 C) (Oral)   Ht '5\' 5"'$  (1.651 m)   Wt 202 lb 14.4 oz (92 kg)   SpO2 100%   BMI 33.76 kg/m   No carotid bruits, heart rrr no murmur or gallop or extra beat; radial pulses 2+, R volar wrist with sensitive superficial subdermal nodule over flexor tendon, chronic.  No abnormal movements of  mouth, lips, tongue, or limbs.  Speech is goal oriented, not tangential, but is somewhat rushed, as though she doesn't want to forget telling me something.  Mood is euthymic today.    Assessment and Plan (problems not ordered by priority)  Hypercholesteremia Has not been taking statin for around a year - she felt it caused increased pain.  Lipid panel today then discuss options again.    Personal history of colonic polyps Surveillance colonoscopy 05/2022 - no polyps.  GI doctor recommends no f/u colonoscopies given absence of polyps on f/u and age.    Herpes simplex Treated, ED visit 05/2022.  Spondylosis without myelopathy or radiculopathy, lumbar region Pt requests in network referral to new spine surgeon to continue radiofrequency denervation treatments. Referral placed. Last treatment 06/16/2021; Lumbar Radiofrequency Denervation of the Medial Branch (and primary dorsal ramus) nerves under fluoroscopic guidance. LEVELS TREATED: L3-L4, L4-L5, and L5-S1 LOCATION: [facility] SURGEON: Janus Netty Starring, M.D.   Anxiety and depression Anxiety continues, and she agrees to referral to LCSW Ms. Trinidad Curet.  Ms. Frye is conscious of "talking non-stop".   Type 2 diabetes mellitus, controlled ([place]), without complications 123456 5.5 on Trulicity 1.5, though pharmacies are now out and her supply is nearly gone.  Will replace with Jardiance 25 mg (this was recommended by her pharmacist) though anticipate  10 mg dose will be sufficient   HTN (hypertension) BP uncontrolled on lisinopril 40 and HCTZ 25.  Recheck BMP before making a decision on medication escalation.   Chronic left shoulder pain Reduced range in elevation, remains in pain, difficult to dress.  Has upcoming appt with her orthopedist.    GERD (gastroesophageal reflux disease) Otc purple pill (Nexium?) prn once a week when she is feeling bloated or nauseated.  Hasn't every taken maalox or mylanta or tums, may try this.  No esophageal pain. Monitor.   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  She'll try to bring next time. Needs pap will f/u with gyn, appt scheduled in 01/2023 DXA completed 2020

## 2022-10-12 NOTE — Patient Instructions (Signed)
Ms. Trapani,  It was great to catch up with you today!  We are catching up with blood work and I'll call you with the results.  We will base our decision about blood pressure, diabetes, and cholesterol treatment on those test results.    Thank you for getting your flu shots, covid shot, and RSV shot.  You are due for a tetanus shot every ten years and can get this at the pharmacy.  I am referring you to see our behavioral health clinician Ms. Milus Height - she is excellent and you will enjoy your visit.

## 2022-10-12 NOTE — Assessment & Plan Note (Signed)
Has not been taking statin for around a year - she felt it caused increased pain.  Lipid panel today then discuss options again.

## 2022-10-13 LAB — BMP8+ANION GAP
Anion Gap: 15 mmol/L (ref 10.0–18.0)
BUN/Creatinine Ratio: 22 (ref 12–28)
BUN: 16 mg/dL (ref 8–27)
CO2: 24 mmol/L (ref 20–29)
Calcium: 9.4 mg/dL (ref 8.7–10.3)
Chloride: 105 mmol/L (ref 96–106)
Creatinine, Ser: 0.72 mg/dL (ref 0.57–1.00)
Glucose: 79 mg/dL (ref 70–99)
Potassium: 4.4 mmol/L (ref 3.5–5.2)
Sodium: 144 mmol/L (ref 134–144)
eGFR: 89 mL/min/{1.73_m2} (ref >59)

## 2022-10-13 LAB — CBC
Hematocrit: 39.6 % (ref 34.0–46.6)
Hemoglobin: 12.6 g/dL (ref 11.1–15.9)
MCH: 26.8 pg (ref 26.6–33.0)
MCHC: 31.8 g/dL (ref 31.5–35.7)
MCV: 84 fL (ref 79–97)
Platelets: 319 10*3/uL (ref 150–450)
RBC: 4.71 x10E6/uL (ref 3.77–5.28)
RDW: 13 % (ref 11.7–15.4)
WBC: 5 10*3/uL (ref 3.4–10.8)

## 2022-10-13 LAB — LIPID PANEL
Chol/HDL Ratio: 3.5 ratio (ref 0.0–4.4)
Cholesterol, Total: 249 mg/dL — ABNORMAL HIGH (ref 100–199)
HDL: 71 mg/dL (ref >39)
LDL Chol Calc (NIH): 161 mg/dL — ABNORMAL HIGH (ref 0–99)
Triglycerides: 101 mg/dL (ref 0–149)
VLDL Cholesterol Cal: 17 mg/dL (ref 5–40)

## 2022-10-13 LAB — VITAMIN D 25 HYDROXY (VIT D DEFICIENCY, FRACTURES): Vit D, 25-Hydroxy: 13.6 ng/mL — ABNORMAL LOW (ref 30.0–100.0)

## 2022-10-14 LAB — MICROALBUMIN / CREATININE URINE RATIO
Creatinine, Urine: 70.5 mg/dL
Microalb/Creat Ratio: 11 mg/g creat (ref 0–29)
Microalbumin, Urine: 8.1 ug/mL

## 2022-10-20 ENCOUNTER — Ambulatory Visit (INDEPENDENT_AMBULATORY_CARE_PROVIDER_SITE_OTHER): Payer: 59

## 2022-10-20 ENCOUNTER — Ambulatory Visit
Admission: EM | Admit: 2022-10-20 | Discharge: 2022-10-20 | Disposition: A | Discharge status: Home / Self Care | Payer: 59 | Attending: Physician Assistant | Admitting: Physician Assistant

## 2022-10-20 DIAGNOSIS — M25511 Pain in right shoulder: Secondary | ICD-10-CM

## 2022-10-20 DIAGNOSIS — W19XXXA Unspecified fall, initial encounter: Secondary | ICD-10-CM

## 2022-10-20 DIAGNOSIS — M25462 Effusion, left knee: Secondary | ICD-10-CM | POA: Diagnosis not present

## 2022-10-20 DIAGNOSIS — M25561 Pain in right knee: Secondary | ICD-10-CM | POA: Diagnosis not present

## 2022-10-20 DIAGNOSIS — M25461 Effusion, right knee: Secondary | ICD-10-CM | POA: Diagnosis not present

## 2022-10-20 DIAGNOSIS — M25562 Pain in left knee: Secondary | ICD-10-CM

## 2022-10-20 DIAGNOSIS — Z043 Encounter for examination and observation following other accident: Secondary | ICD-10-CM | POA: Diagnosis not present

## 2022-10-20 MED ORDER — PREDNISONE 20 MG PO TABS: 40.0000 mg | ORAL_TABLET | Freq: Every day | ORAL | 0 refills | Status: AC

## 2022-10-20 NOTE — ED Provider Notes (Signed)
[name]    CSN: [id number] Arrival date & time: 10/20/22  1427      History   Chief Complaint Chief Complaint  Patient presents with   Fall    HPI Melissa [name] is a 72 y.o. female.   Patient here today for evaluation of bilateral knee pain and right shoulder pain after a fall earlier today.  She reports that she was in [place] and felt as if her legs locked up and she states when she fell she was holding onto the table with her right arm and thinks this is what twisted her shoulder.  She did not hit her head.  She has had right shoulder replacement as well as bilateral knee replacements in the past.  She denies any numbness or tingling.  She is able to ambulate but states that she does have some pain in her knees with walking as well as pain in her shoulder with movement.  She has not taken any medication for symptoms.  The history is provided by the patient.  Fall Pertinent negatives include no abdominal pain.    Past Medical History:  Diagnosis Date   Allergy    Anxiety    Arthritis    s/p R TKR   Cyst of left kidney 2015   by lumbar MRI pending renal US   DDD (degenerative disc disease), lumbar 03/2014   severe L2-3 with L HNP with L2/3 nerve root impingement [name])   Depression with anxiety    Diabetes type 2, controlled ([place]) 2011   borderline   Glaucoma    History of ulcer disease    HLD (hyperlipidemia)    no meds taken   Hypertension    OAB (overactive bladder) 10/14/2019   Osteoarthritis of spine with radiculopathy, 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   Plantar fasciitis of left foot 10/01/2013   PONV (postoperative nausea and vomiting)    Primary osteoarthritis of left knee 07/11/2021   Right-sided face pain 01/07/2019   Rotator cuff tear 02/02/2017   Sleep apnea 2022   no cpap   Trigger middle finger of right hand 11/12/2018   Vitreous degeneration, unspecified eye 10/25/2012    Patient Active Problem List   Diagnosis Date Noted    Chronic insomnia 10/12/2022   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   Decreased functional mobility 02/01/2022   Personal history of colonic polyps 01/26/2022   History of total knee replacement, bilateral 07/11/2021   Sleep apnea, intolerant of CPAP, declines 05/24/2021   Healthcare maintenance 01/07/2019   Spondylosis of cervical region without myelopathy or radiculopathy 01/14/2018   Chronic left shoulder pain 01/08/2018   History of bilateral hip replacements 07/19/2017   Acquired renal cyst of left kidney 01/01/2016   DDD (degenerative disc disease), lumbar 03/04/2014   Obesity 11/13/2012   Glaucoma    Type 2 diabetes mellitus, controlled ([place])    Anxiety and depression    HTN (hypertension) 01/06/2012   GERD (gastroesophageal reflux disease) 01/06/2012   Hypercholesteremia 01/06/2012    Past Surgical History:  Procedure Laterality Date   ABDOMINAL HYSTERECTOMY  2007   fibroids, heavy bleeding   ARTERY BIOPSY Right 01/23/2019   Procedure: REMOVAL OF PART OF RIGHT TEMPORAL ARTERY FOR BIOPSY;  Surgeon: Michael Boston, MD;  Location: [facility];  Service: General;  Laterality: Right;   CATARACT EXTRACTION W/ INTRAOCULAR LENS IMPLANT Bilateral    COLONOSCOPY  04/2017   TA, diverticulosis  ([facility])   GLAUCOMA SURGERY     HAMMER TOE  SURGERY Bilateral    Right hip replacement     TONSILLECTOMY     TOTAL HIP ARTHROPLASTY Left 2021   TOTAL KNEE ARTHROPLASTY Right 2004   TKR (Dr. Eddie Dibbles with [facility])   TOTAL KNEE ARTHROPLASTY Left 07/11/2021   Procedure: LEFT TOTAL KNEE ARTHROPLASTY;  Surgeon: Leandrew Koyanagi, MD;  Location: [facility];  Service: Orthopedics;  Laterality: Left;   TOTAL KNEE REVISION Right 03/2014   Dr Al Corpus [facility]   TOTAL SHOULDER ARTHROPLASTY Right 2022    OB History     Gravida  0   Para      Term      Preterm      AB      Living  0      SAB      IAB      Ectopic      Multiple      Live Births               Home Medications    Prior to  Admission medications   Medication Sig Start Date End Date Taking? Authorizing Provider  predniSONE (DELTASONE) 20 MG tablet Take 2 tablets (40 mg total) by mouth daily with breakfast for 5 days. 10/20/22 10/25/22 Yes Francene Finders, PA-C  Accu-Chek FastClix Lancets MISC USE TO TEST BLOOD SUGAR UP TO FOUR TIMES DAILY 11/25/18   Dragnev, Caesar Chestnut, NP  ACCU-CHEK GUIDE test strip USE AS DIRECTED FOUR TIMES DAILY 06/28/20   Biagio Borg, MD  acetaminophen (TYLENOL) 500 MG tablet Take 500 mg by mouth every 6 (six) hours as needed.    [provider]  ALPRAZolam Duanne Moron) 0.25 MG tablet TAKE 1 TABLET(0.25 MG) BY MOUTH AT BEDTIME AS NEEDED FOR SLEEP 10/03/22   Angelica Pou, MD  aspirin EC 81 MG tablet Take 81 mg by mouth daily. Swallow whole.    [provider]  blood glucose meter kit and supplies KIT Dispense based on patient and insurance preference. Use up to four times daily as directed. DX Code: E11.9 02/11/18   Margot Ables, NP  escitalopram (LEXAPRO) 10 MG tablet TAKE 1 TABLET(10 MG) BY MOUTH DAILY 09/14/22   Angelica Pou, MD  hydrochlorothiazide (HYDRODIURIL) 25 MG tablet Take 1 tablet (25 mg total) by mouth daily. Patient taking differently: Take 25 mg by mouth daily as needed (swelling). 05/07/20   Tasia Catchings, Amy V, PA-C  lisinopril (ZESTRIL) 40 MG tablet Take 1 tablet (40 mg total) by mouth daily. 06/27/22   Angelica Pou, MD  omeprazole (PRILOSEC) 20 MG capsule Take 20 mg by mouth daily as needed (heartburn).    [provider]  rosuvastatin (CRESTOR) 5 MG tablet Take 1 tablet (5 mg total) by mouth daily. 08/02/22   Angelica Pou, MD  TRULICITY 1.5 0000000 SOPN Inject 1.5 mg into the skin every Friday. 07/28/22 06/29/23  Angelica Pou, MD  VYZULTA 0.024 % SOLN Place 1 drop into both eyes at bedtime. 05/16/21   [provider]    Family History Family History  Problem Relation Age of Onset   Cancer Maternal  Grandmother 83       ovarian   Diabetes Maternal Grandmother    Hypertension Maternal Grandmother    Cancer Mother 59       bone, MM   Stroke Other        unsure who   CAD Neg Hx    Colon cancer Neg Hx    Esophageal  cancer Neg Hx    Rectal cancer Neg Hx    Stomach cancer Neg Hx     Social History Social History   Tobacco Use   Smoking status: Never   Smokeless tobacco: Never  Vaping Use   Vaping Use: Never used  Substance Use Topics   Alcohol use: No   Drug use: No     Allergies   Codeine, Metformin and related, Oxycodone, and Latex   Review of Systems Review of Systems  Constitutional:  Negative for chills and fever.  Eyes:  Negative for discharge and redness.  Gastrointestinal:  Negative for abdominal pain, nausea and vomiting.  Musculoskeletal:  Positive for arthralgias and joint swelling.  Neurological:  Negative for numbness.     Physical Exam Triage Vital Signs ED Triage Vitals  Enc Vitals Group     BP 10/20/22 1522 135/86     Pulse Rate 10/20/22 1522 68     Resp 10/20/22 1522 18     Temp 10/20/22 1522 97.6 F (36.4 C)     Temp Source 10/20/22 1522 Oral     SpO2 10/20/22 1522 97 %     Weight --      Height --      Head Circumference --      Peak Flow --      Pain Score 10/20/22 1524 9     Pain Loc --      Pain Edu? --      Excl. in [place]? --    No data found.  Updated Vital Signs BP 135/86 (BP Location: Left Arm)   Pulse 68   Temp 97.6 F (36.4 C) (Oral)   Resp 18   SpO2 97%      Physical Exam Vitals and nursing note reviewed.  Constitutional:      General: She is not in acute distress.    Appearance: Normal appearance. She is not ill-appearing.  HENT:     Head: Normocephalic and atraumatic.  Eyes:     Conjunctiva/sclera: Conjunctivae normal.  Cardiovascular:     Rate and Rhythm: Normal rate.  Pulmonary:     Effort: Pulmonary effort is normal. No respiratory distress.  Musculoskeletal:     Comments: Minimally decreased flexion  extension of bilateral knees due to pain.  Mild swelling noted to bilateral knees, worse to right.  Mildly decreased range of motion of right shoulder in all directions due to pain.  Neurological:     Mental Status: She is alert.  Psychiatric:        Mood and Affect: Mood normal.        Behavior: Behavior normal.        Thought Content: Thought content normal.      UC Treatments / Results  Labs (all labs ordered are listed, but only abnormal results are displayed) Labs Reviewed - No data to display  EKG   Radiology DG Shoulder Right  Result Date: 10/20/2022 CLINICAL DATA:  Fall EXAM: RIGHT SHOULDER - 2+ VIEW COMPARISON:  09/28/2021 FINDINGS: Reverse right shoulder replacement with intact hardware and normal alignment. No fracture. Right apex is clear IMPRESSION: Right shoulder replacement. No acute osseous abnormality. Electronically Signed   By: Donavan Foil M.D.   On: 10/20/2022 16:06   DG Knee AP/LAT W/Sunrise Right  Result Date: 10/20/2022 CLINICAL DATA:  Fall EXAM: RIGHT KNEE 3 VIEWS COMPARISON:  01/10/2022 FINDINGS: Right knee replacement with intact hardware and normal alignment. No definitive fracture is seen. Positive for knee effusion. IMPRESSION:  Right knee replacement. No definitive fracture is seen. Positive for knee effusion. Electronically Signed   By: Donavan Foil M.D.   On: 10/20/2022 16:05   DG Knee AP/LAT W/Sunrise Left  Result Date: 10/20/2022 CLINICAL DATA:  Fall EXAM: LEFT KNEE 3 VIEWS COMPARISON:  01/10/2022 FINDINGS: Left knee replacement with intact hardware and normal alignment. No fracture. Small knee effusion. IMPRESSION: Left knee replacement. Small knee effusion. No acute osseous abnormality Electronically Signed   By: Donavan Foil M.D.   On: 10/20/2022 16:03    Procedures Procedures (including critical care time)  Medications Ordered in UC Medications - No data to display  Initial Impression / Assessment and Plan / UC Course  I have reviewed  the triage vital signs and the nursing notes.  Pertinent labs & imaging results that were available during my care of the patient were reviewed by me and considered in my medical decision making (see chart for details).    X-rays without fracture but effusions noted to bilateral knees.  Will treat with steroid burst and recommended follow-up with Ortho if no gradual improvement or with any worsening or further concerns.  Patient expresses understanding.  Final Clinical Impressions(s) / UC Diagnoses   Final diagnoses:  Fall, initial encounter  Acute pain of both knees  Acute pain of right shoulder   Discharge Instructions   None    ED Prescriptions     Medication Sig Dispense Auth. Provider   predniSONE (DELTASONE) 20 MG tablet Take 2 tablets (40 mg total) by mouth daily with breakfast for 5 days. 10 tablet Francene Finders, PA-C      PDMP not reviewed this encounter.   Francene Finders, PA-C 10/20/22 [phone]

## 2022-10-20 NOTE — ED Triage Notes (Signed)
Pt reports she fell on her knees today, she reports there is pain to both needs and right shoulder.   Home interventions: none

## 2022-10-23 ENCOUNTER — Ambulatory Visit (INDEPENDENT_AMBULATORY_CARE_PROVIDER_SITE_OTHER): Payer: 59 | Admitting: Licensed Clinical Social Worker

## 2022-10-23 DIAGNOSIS — F32A Depression, unspecified: Secondary | ICD-10-CM

## 2022-10-23 DIAGNOSIS — F419 Anxiety disorder, unspecified: Secondary | ICD-10-CM

## 2022-10-23 NOTE — BH Specialist Note (Signed)
Integrated Behavioral Health Initial In-Person Visit  MRN: [id number] Name: Melissa [name]  Number of [facility] Clinician visits: 1- Initial Visit  Session Start time: [id number]    Session End time: [id number]  Total time in minutes: 40   Types of Service: Individual psychotherapy and General Behavioral Integrated Care (BHI)  Interpretor:No. Interpretor Name and Language: N/A   Subjective: Melissa [name] is a 72 y.o. female  Patient was referred by PCP for Anxiety/Depression. Patient reports the following symptoms/concerns: Low mood, Rapid pace when speaking with others and inability to control interrupting others.  Duration of problem: 1 year; Severity of problem: severe  Objective: Mood: Anxious and Affect: Appropriate Risk of harm to self or others: No plan to harm self or others  Life Context:  Self-Care: Read, Walking and [employer], Faith in God   Patient and/or Family's Strengths/Protective Factors: Concrete supports in place (healthy food, safe environments, etc.) and Sense of purpose  Goals Addressed: Patient will: Reduce symptoms of: anxiety  Progress towards Goals: Ongoing  Interventions: Interventions utilized: Mindfulness or Relaxation Training and Supportive Counseling  Standardized Assessments completed: PHQ-SADS   Assessment: Patient currently experiencing Patient states she has intense anxiety and it interrupts her ability to have a quality conversation with friends and family. Patient describes herself as constantly interrupting others during phone conversations and not allowing the other person to speak their full thoughts. Patient states this causes her anxiety and depression because she feels bad for the constant interruptions. Patient set a goal to control her urge to interrupt others and to be a better listener.   During the office visit, [facility] educated and practiced active listening skills with patient. Patient has a task  of allowing 3 seconds to laps before speaking her thoughts in a conversation. This will allow her to know if the other person has completed their statement. [facility] and patient also discussed deep breathing.    Patient may benefit from Mindfulness or Relaxation Training and Supportive Counseling.  Plan: Follow up with behavioral health clinician on : Within the next 14 days.  Milus Height, MSW, [facility] Direct Dial:[phone]  Fax [phone] Main Office Phone: [phone] [address] [place], [place] [address] Website: [place], [place]

## 2022-10-24 ENCOUNTER — Encounter: Payer: Self-pay | Admitting: Internal Medicine

## 2022-10-30 ENCOUNTER — Other Ambulatory Visit: Payer: Self-pay | Admitting: Internal Medicine

## 2022-10-30 DIAGNOSIS — H401133 Primary open-angle glaucoma, bilateral, severe stage: Secondary | ICD-10-CM | POA: Diagnosis not present

## 2022-10-30 DIAGNOSIS — M51369 Other intervertebral disc degeneration, lumbar region without mention of lumbar back pain or lower extremity pain: Secondary | ICD-10-CM

## 2022-10-30 DIAGNOSIS — M5136 Other intervertebral disc degeneration, lumbar region: Secondary | ICD-10-CM

## 2022-10-30 DIAGNOSIS — Z794 Long term (current) use of insulin: Secondary | ICD-10-CM | POA: Diagnosis not present

## 2022-10-30 DIAGNOSIS — Z961 Presence of intraocular lens: Secondary | ICD-10-CM | POA: Diagnosis not present

## 2022-10-30 DIAGNOSIS — I10 Essential (primary) hypertension: Secondary | ICD-10-CM

## 2022-10-30 DIAGNOSIS — Q385 Congenital malformations of palate, not elsewhere classified: Secondary | ICD-10-CM

## 2022-10-30 DIAGNOSIS — E559 Vitamin D deficiency, unspecified: Secondary | ICD-10-CM

## 2022-10-30 DIAGNOSIS — E119 Type 2 diabetes mellitus without complications: Secondary | ICD-10-CM

## 2022-10-31 ENCOUNTER — Other Ambulatory Visit: Payer: Self-pay | Admitting: Internal Medicine

## 2022-10-31 ENCOUNTER — Encounter: Payer: Self-pay | Admitting: Internal Medicine

## 2022-11-01 ENCOUNTER — Telehealth: Payer: Self-pay | Admitting: *Deleted

## 2022-11-01 MED ORDER — EMPAGLIFLOZIN 25 MG PO TABS: 25.0000 mg | ORAL_TABLET | Freq: Every day | ORAL | 5 refills | Status: DC

## 2022-11-01 MED ORDER — VITAMIN D3 1.25 MG (50000 UT) PO TABS: 50000.0000 [IU] | ORAL_TABLET | ORAL | 0 refills | Status: DC

## 2022-11-01 MED ORDER — LOSARTAN POTASSIUM 50 MG PO TABS: 50.0000 mg | ORAL_TABLET | Freq: Every day | ORAL | 5 refills | Status: DC

## 2022-11-01 NOTE — Telephone Encounter (Signed)
Call from patient states that she has not received her refill on the Jardiance or the new blood pressure medication that she discussed with Dr. Jimmye Norman.  Is concerned that her sugars are going up since she has not gotten her medications.  Meds need to go to the [facility] on [address] and [address].

## 2022-11-07 ENCOUNTER — Ambulatory Visit: Payer: 59 | Admitting: Licensed Clinical Social Worker

## 2022-11-07 ENCOUNTER — Other Ambulatory Visit: Payer: Self-pay

## 2022-11-07 DIAGNOSIS — F32A Depression, unspecified: Secondary | ICD-10-CM

## 2022-11-07 MED ORDER — ALPRAZOLAM 0.25 MG PO TABS: ORAL_TABLET | ORAL | 0 refills | Status: DC

## 2022-11-08 ENCOUNTER — Ambulatory Visit (INDEPENDENT_AMBULATORY_CARE_PROVIDER_SITE_OTHER): Payer: 59 | Admitting: Orthopaedic Surgery

## 2022-11-08 ENCOUNTER — Ambulatory Visit (HOSPITAL_BASED_OUTPATIENT_CLINIC_OR_DEPARTMENT_OTHER): Payer: Self-pay | Admitting: Orthopaedic Surgery

## 2022-11-08 DIAGNOSIS — M75112 Incomplete rotator cuff tear or rupture of left shoulder, not specified as traumatic: Secondary | ICD-10-CM

## 2022-11-08 NOTE — Progress Notes (Signed)
Chief Complaint: Bilateral shoulder pain     History of Present Illness:   11/08/2022: Presents today for follow-up of the MRI of the left shoulder.  Left shoulder continues to have limited motion and pain.  Unfortunately she did also have a fall for which she sustained a wound on the right leg which has been healing up well.  She is still having difficulty with overhead motion of the left shoulder.  Jacarra F Sorto is a 72 y.o. female presents today for follow-up of both of her shoulders.  She is status post right reverse shoulder arthroplasty done at [facility] February 23, 2021.  Unfortunately she did have quite a significant reaction to the surgical glue that was used postoperatively.  As result she was unable to get physical therapy as a result of this prolonged recovery.  She presents today here for further follow-up and discussion.  Overall she feels like the right shoulder is stuck and quite limited in terms of motion.  She is continue to experience pain about the lateral aspect of the shoulder.  With regard to the left shoulder she did have a rotator cuf that was repaired 4 years prior.  She was doing very well until recently.  Over the last several months she has had pain and weakness with difficulty laying directly on the side.    Surgical History:   None  PMH/PSH/Family History/Social History/Meds/Allergies:    Past Medical History:  Diagnosis Date   Allergy    Anxiety    Arthritis    s/p R TKR   Cyst of left kidney 2015   by lumbar MRI pending renal US   DDD (degenerative disc disease), lumbar 03/2014   severe L2-3 with L HNP with L2/3 nerve root impingement [name])   Depression with anxiety    Diabetes type 2, controlled ([place]) 2011   borderline   Glaucoma    History of ulcer disease    HLD (hyperlipidemia)    no meds taken   Hypertension    OAB (overactive bladder) 10/14/2019   Osteoarthritis of spine with radiculopathy, lumbar  region 12/15/2014   Plantar fasciitis of left foot 10/01/2013   PONV (postoperative nausea and vomiting)    Primary osteoarthritis of left knee 07/11/2021   Right-sided face pain 01/07/2019   Rotator cuff tear 02/02/2017   Sleep apnea 2022   no cpap   Trigger middle finger of right hand 11/12/2018   Vitreous degeneration, unspecified eye 10/25/2012   Past Surgical History:  Procedure Laterality Date   ABDOMINAL HYSTERECTOMY  2007   fibroids, heavy bleeding   ARTERY BIOPSY Right 01/23/2019   Procedure: REMOVAL OF PART OF RIGHT TEMPORAL ARTERY FOR BIOPSY;  Surgeon: Michael Boston, MD;  Location: [facility];  Service: General;  Laterality: Right;   CATARACT EXTRACTION W/ INTRAOCULAR LENS IMPLANT Bilateral    COLONOSCOPY  04/2017   TA, diverticulosis  ([facility])   GLAUCOMA SURGERY     HAMMER TOE SURGERY Bilateral    Right hip replacement     TONSILLECTOMY     TOTAL HIP ARTHROPLASTY Left 2021   TOTAL KNEE ARTHROPLASTY Right 2004   TKR (Dr. Eddie Dibbles with [facility])   TOTAL KNEE ARTHROPLASTY Left 07/11/2021   Procedure: LEFT TOTAL KNEE ARTHROPLASTY;  Surgeon: Leandrew Koyanagi, MD;  Location: [facility]  OR;  Service: Orthopedics;  Laterality: Left;   TOTAL KNEE REVISION Right 03/2014   Dr Al Corpus [facility]   TOTAL SHOULDER ARTHROPLASTY Right 2022   Social History   Socioeconomic History   Marital status: Single    Spouse name: Not on file   Number of children: 0   Years of education: 16   Highest education level: Not on file  Occupational History   Occupation: work with disabilities/ Retired    Fish farm manager: [employer]  Tobacco Use   Smoking status: Never   Smokeless tobacco: Never  Vaping Use   Vaping Use: Never used  Substance and Sexual Activity   Alcohol use: No   Drug use: No   Sexual activity: Not Currently    Birth control/protection: Surgical  Other Topics Concern   Not on file  Social History Narrative   Caffeine: rare   Lives alone   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[employer]   Fun/Hobbies: Gardening    Social Determinants of Health   Financial Resource Strain: Low Risk  (12/01/2021)   Overall Financial Resource Strain (CARDIA)    Difficulty of Paying Living Expenses: Not hard at all  Food Insecurity: No Food Insecurity (12/01/2021)   Hunger Vital Sign    Worried About Running Out of Food in the Last Year: Never true    [place] in the Last Year: Never true  Transportation Needs: No Transportation Needs (12/01/2021)   PRAPARE - Hydrologist (Medical): No    Lack of Transportation (Non-Medical): No  Physical Activity: Inactive (12/01/2021)   Exercise Vital Sign    Days of Exercise per Week: 0 days    Minutes of Exercise per Session: 0 min  Stress: Stress Concern Present (12/01/2021)   [place]    Feeling of Stress : Rather much  Social Connections: Moderately Integrated (12/01/2021)   Social Connection and Isolation Panel [NHANES]    Frequency of Communication with Friends and Family: Twice a week    Frequency of Social Gatherings with Friends and Family: Twice a week    Attends Religious Services: 1 to 4 times per year    Active Member of [employer] or Organizations: Yes    Attends Archivist Meetings: Never    Marital Status: Never married   Family History  Problem Relation Age of Onset   Cancer Maternal Grandmother 30       ovarian   Diabetes Maternal Grandmother    Hypertension Maternal Grandmother    Cancer Mother 37       bone, MM   Stroke Other        unsure who   CAD Neg Hx    Colon cancer Neg Hx    Esophageal cancer Neg Hx    Rectal cancer Neg Hx    Stomach cancer Neg Hx    Allergies  Allergen Reactions   Codeine Other (See Comments)    palpitations   Metformin And Related Nausea Only   Oxycodone Nausea And Vomiting   Latex Dermatitis, Itching and Rash   Current Outpatient Medications  Medication Sig Dispense  Refill   Accu-Chek FastClix Lancets MISC USE TO TEST BLOOD SUGAR UP TO FOUR TIMES DAILY 306 each 1   ACCU-CHEK GUIDE test strip USE AS DIRECTED FOUR TIMES DAILY 100 strip 11   acetaminophen (TYLENOL) 500 MG tablet Take 500 mg by mouth every 6 (six) hours as needed.  ALPRAZolam (XANAX) 0.25 MG tablet TAKE 1 TABLET(0.25 MG) BY MOUTH AT BEDTIME AS NEEDED FOR SLEEP 30 tablet 0   aspirin EC 81 MG tablet Take 81 mg by mouth daily. Swallow whole.     blood glucose meter kit and supplies KIT Dispense based on patient and insurance preference. Use up to four times daily as directed. DX Code: E11.9 1 each 0   Cholecalciferol (VITAMIN D3) 1.25 MG (50000 UT) TABS Take 50,000 Units by mouth once a week for 6 doses. 6 tablet 0   empagliflozin (JARDIANCE) 25 MG TABS tablet Take 1 tablet (25 mg total) by mouth daily before breakfast. 30 tablet 5   escitalopram (LEXAPRO) 10 MG tablet TAKE 1 TABLET(10 MG) BY MOUTH DAILY 90 tablet 3   hydrochlorothiazide (HYDRODIURIL) 25 MG tablet Take 1 tablet (25 mg total) by mouth daily. (Patient taking differently: Take 25 mg by mouth daily as needed (swelling).) 30 tablet 0   losartan (COZAAR) 50 MG tablet Take 1 tablet (50 mg total) by mouth daily. 30 tablet 5   omeprazole (PRILOSEC) 20 MG capsule Take 20 mg by mouth daily as needed (heartburn).     rosuvastatin (CRESTOR) 5 MG tablet Take 1 tablet (5 mg total) by mouth daily. 90 tablet 3   VYZULTA 0.024 % SOLN Place 1 drop into both eyes at bedtime.     No current facility-administered medications for this visit.   No results found.  Review of Systems:   A ROS was performed including pertinent positives and negatives as documented in the HPI.  Physical Exam :   Constitutional: NAD and appears stated age Neurological: Alert and oriented Psych: Appropriate affect and cooperative There were no vitals taken for this visit.   Comprehensive Musculoskeletal Exam:    Musculoskeletal Exam    Inspection Right Left  Skin  No atrophy or winging No atrophy or winging  Palpation    Tenderness Lateral deltoid Lateral deltoid  Range of Motion    Flexion (passive) 140 160  Flexion (active) 110 150  Abduction 100 1500  ER at the side 30 40  Can reach behind back to Sacrum L5  Strength     Weakness Positive weakness supraspinatus  Special Tests    Pseudoparalytic No No  Neurologic    Fires PIN, radial, median, ulnar, musculocutaneous, axillary, suprascapular, long thoracic, and spinal accessory innervated muscles. No abnormal sensibility  Vascular/Lymphatic    Radial Pulse 2+ 2+  Cervical Exam    Patient has symmetric cervical range of motion with negative Spurling's test.  Special Test: Right skin incision is well-healed, positive drop arm on the left with weakness     Imaging:   Xray (3 views right shoulder, 3 views left shoulder): She is status post right reverse shoulder arthroplasty without evidence of complication or hardware failure, with regard to the left shoulder she has hardware in place consistent with rotator cuff tear.  The glenohumeral space is maintained  MRI left shoulder: There is a full-thickness rotator cuff tear status post previous repair.  She has significant fatty atrophy with significant retraction  I personally reviewed and interpreted the radiographs.   Assessment:   72 year old female with right shoulder pain and weakness following reverse arthroplasty.  Overall I do believe and agree that her rehab was somewhat slower and limited as result of her skin complication from the Dermabond glue.  She does have a somewhat weak scapular muscle at this time.  With regard to the left shoulder she does have  a full-thickness essentially nonrepairable rotator cuff tear on the side.  Given the fact that she has previous arthroplasty and evidence of osteoarthritis on MRI I do believe that shoulder arthroplasty would be the optimal procedure for the left.  That being said she is hoping to defer  any type of arthroplasty at this time.  Given that I did discuss more palliative options including an in space subacromial balloon.  Overall I did describe that this would be more of a temporary solution but would avoid the risk of needing an arthroplasty.  Given the fact that this is a lower morbidity procedure she would like to proceed with this.  I did discuss the risks and complications associated with this specifically.  This time she understands those and would like to proceed with arthroscopy and in space balloon placement Plan :    -Plan for left shoulder arthroscopy with subacromial balloon placement   After a lengthy discussion of treatment options, including risks, benefits, alternatives, complications of surgical and nonsurgical conservative options, the patient elected surgical repair.   The patient  is aware of the material risks  and complications including, but not limited to injury to adjacent structures, neurovascular injury, infection, numbness, bleeding, implant failure, thermal burns, stiffness, persistent pain, failure to heal, disease transmission from allograft, need for further surgery, dislocation, anesthetic risks, blood clots, risks of death,and others. The probabilities of surgical success and failure discussed with patient given their particular co-morbidities.The time and nature of expected rehabilitation and recovery was discussed.The patient's questions were all answered preoperatively.  No barriers to understanding were noted. I explained the natural history of the disease process and Rx rationale.  I explained to the patient what I considered to be reasonable expectations given their personal situation.  The final treatment plan was arrived at through a shared patient decision making process model.       I personally saw and evaluated the patient, and participated in the management and treatment plan.  Vanetta Mulders, MD Attending Physician, Orthopedic  Surgery  This document was dictated using Dragon voice recognition software. A reasonable attempt at proof reading has been made to minimize errors.

## 2022-11-09 ENCOUNTER — Encounter: Payer: Medicare Other | Admitting: Internal Medicine

## 2022-11-10 DIAGNOSIS — S82899A Other fracture of unspecified lower leg, initial encounter for closed fracture: Secondary | ICD-10-CM | POA: Insufficient documentation

## 2022-11-10 DIAGNOSIS — D1039 Benign neoplasm of other parts of mouth: Secondary | ICD-10-CM | POA: Insufficient documentation

## 2022-11-10 DIAGNOSIS — B009 Herpesviral infection, unspecified: Secondary | ICD-10-CM | POA: Insufficient documentation

## 2022-11-10 NOTE — Assessment & Plan Note (Signed)
Needs TDaP Has received all Covid, flu, rsv, at [facility], around 05/2022, has cards at home with specifics.  She'll try to bring next time. Needs pap will f/u with gyn, appt scheduled in 01/2023 DXA completed 2020

## 2022-11-10 NOTE — Assessment & Plan Note (Addendum)
  Otc purple pill (Nexium?) prn once a week when she is feeling bloated or nauseated.  Hasn't every taken maalox or mylanta or tums, may try this. No esophageal pain. Monitor.

## 2022-11-10 NOTE — Assessment & Plan Note (Signed)
Reduced range in elevation, remains in pain, difficult to dress.  Has upcoming appt with her orthopedist.

## 2022-11-10 NOTE — Assessment & Plan Note (Addendum)
Pt requests in network referral to new spine surgeon to continue radiofrequency denervation treatments. Referral placed. Last treatment 06/16/2021; Lumbar Radiofrequency Denervation of the Medial Branch (and primary dorsal ramus) nerves under fluoroscopic guidance. LEVELS TREATED: L3-L4, L4-L5, and L5-S1 LOCATION: [facility] SURGEON: Janus Netty Starring, M.D.

## 2022-11-10 NOTE — Assessment & Plan Note (Signed)
Anxiety continues, and she agrees to referral to LCSW Ms. Trinidad Curet.  Ms. Bishop is conscious of "talking non-stop".

## 2022-11-10 NOTE — Assessment & Plan Note (Signed)
123456 5.5 on Trulicity 1.5, though pharmacies are now out and her supply is nearly gone.  Will replace with Jardiance 25 mg (this was recommended by her pharmacist) though anticipate 10 mg dose will be sufficient

## 2022-11-10 NOTE — Assessment & Plan Note (Signed)
BP uncontrolled on lisinopril 40 and HCTZ 25.  Recheck BMP before making a decision on medication escalation.

## 2022-11-10 NOTE — Assessment & Plan Note (Signed)
Surveillance colonoscopy 05/2022 - no polyps.  GI doctor recommends no f/u colonoscopies given absence of polyps on f/u and age.

## 2022-11-10 NOTE — Assessment & Plan Note (Signed)
Treated, ED visit 05/2022.

## 2022-11-10 NOTE — Assessment & Plan Note (Signed)
Referral to oral surgeon placed

## 2022-11-16 ENCOUNTER — Encounter: Payer: Self-pay | Admitting: Internal Medicine

## 2022-11-16 ENCOUNTER — Telehealth: Payer: Self-pay | Admitting: *Deleted

## 2022-11-16 NOTE — Telephone Encounter (Signed)
Received call from April at [facility]. Stated they received surgical clearance and when she called the pt to schedule the surgery, pt told her she had started back on Trulicity. So surgery was re-schedule for April 9th. Their office needs to know if pt can hold Trulicity 1 week prior to this date?  Thanks

## 2022-11-17 ENCOUNTER — Other Ambulatory Visit: Payer: Self-pay | Admitting: Internal Medicine

## 2022-11-17 DIAGNOSIS — E119 Type 2 diabetes mellitus without complications: Secondary | ICD-10-CM

## 2022-11-17 MED ORDER — TRULICITY 1.5 MG/0.5ML ~~LOC~~ SOAJ: 1.5000 mg | SUBCUTANEOUS | 3 refills | Status: DC

## 2022-11-17 NOTE — Telephone Encounter (Signed)
Talked to Melissa [name] @ [facility] about holding Trulicity prior to pt's surgery - informed "Yes absolutely she can hold it as long as needed." Per Dr Jimmye Norman. Melissa [name] stated she will call the pt.

## 2022-11-21 ENCOUNTER — Ambulatory Visit (INDEPENDENT_AMBULATORY_CARE_PROVIDER_SITE_OTHER): Payer: Self-pay | Admitting: Licensed Clinical Social Worker

## 2022-11-21 DIAGNOSIS — F419 Anxiety disorder, unspecified: Secondary | ICD-10-CM

## 2022-11-21 DIAGNOSIS — F32A Depression, unspecified: Secondary | ICD-10-CM

## 2022-11-21 NOTE — BH Specialist Note (Signed)
Integrated Behavioral Health via Telemedicine Visit  11/21/2022 Damari TAYONNA BACHA [id number]  Number of Integrated Behavioral Health Clinician visits: 2- Second Visit  Session Start time: 1430   Session End time: 1500  Total time in minutes: 30  Types of Service: Individual psychotherapy and General Behavioral Integrated Care (BHI)   Interpretor:No. Interpretor Name and Language: N/A     Subjective: Jezel F Appleman is a 72 y.o. female  Patient was referred by PCP for Anxiety/Depression. Patient reports the following symptoms/concerns: Low mood, Rapid pace when speaking with others and inability to control interrupting others.  Duration of problem: 1 year; Severity of problem: severe   Objective: Mood: Anxious and Affect: Appropriate Risk of harm to self or others: No plan to harm self or others   Life Context:   Self-Care: Read, Walking and [place], Faith in God    Patient and/or Family's Strengths/Protective Factors: Concrete supports in place (healthy food, safe environments, etc.) and Sense of purpose   Goals Addressed: Patient will: Reduce symptoms of: anxiety   Progress towards Goals: Ongoing   Interventions: Interventions utilized: Mindfulness or Relaxation Training and Supportive Counseling  Standardized Assessments completed: PHQ-SADS     Assessment: Previous visit Patient expressed intense anxiety and stated she  interrupts others which enables  her ability to have a quality conversation with friends and family. Patient has since practiced active listening skills. Patient has practiced the 333 rule and  patient also has practiced deep breathing. Patient expressed improvement and will continue behavior modification techniques.   Patient may benefit from Mindfulness or Relaxation Training and Supportive Counseling.   Plan: Follow up with behavioral health clinician on : Within the next 30 days.   Christen Butter, MSW,  LCSW-A She/Her Behavioral Health Clinician [facility]  Internal Medicine Center Direct Dial:[phone]  Fax [phone] Main Office Phone: [phone] [address] [place] [place], [place] [address] Website: [facility] Internal Medicine [facility] [address]  [place], [place]  [place]

## 2022-11-22 ENCOUNTER — Other Ambulatory Visit: Payer: Self-pay | Admitting: Internal Medicine

## 2022-11-22 DIAGNOSIS — Z1231 Encounter for screening mammogram for malignant neoplasm of breast: Secondary | ICD-10-CM

## 2022-11-25 ENCOUNTER — Encounter: Payer: Self-pay | Admitting: Internal Medicine

## 2022-12-05 ENCOUNTER — Encounter (HOSPITAL_BASED_OUTPATIENT_CLINIC_OR_DEPARTMENT_OTHER): Payer: Self-pay | Admitting: Orthopaedic Surgery

## 2022-12-05 ENCOUNTER — Other Ambulatory Visit: Payer: Self-pay

## 2022-12-07 ENCOUNTER — Other Ambulatory Visit: Payer: Self-pay

## 2022-12-07 DIAGNOSIS — F32A Depression, unspecified: Secondary | ICD-10-CM

## 2022-12-08 ENCOUNTER — Other Ambulatory Visit: Payer: Self-pay

## 2022-12-08 DIAGNOSIS — F32A Anxiety disorder, unspecified: Secondary | ICD-10-CM

## 2022-12-09 MED ORDER — ALPRAZOLAM 0.25 MG PO TABS
ORAL_TABLET | ORAL | 0 refills | Status: DC
Start: 2022-12-09 — End: 2023-01-23

## 2022-12-11 ENCOUNTER — Encounter: Payer: Self-pay | Admitting: Internal Medicine

## 2022-12-11 ENCOUNTER — Telehealth: Payer: Self-pay | Admitting: Internal Medicine

## 2022-12-11 ENCOUNTER — Encounter (HOSPITAL_BASED_OUTPATIENT_CLINIC_OR_DEPARTMENT_OTHER)
Admission: RE | Admit: 2022-12-11 | Discharge: 2022-12-11 | Disposition: A | Discharge status: Home / Self Care | Payer: 59 | Source: Ambulatory Visit | Attending: Orthopaedic Surgery | Admitting: Orthopaedic Surgery

## 2022-12-11 DIAGNOSIS — Z79899 Other long term (current) drug therapy: Secondary | ICD-10-CM | POA: Diagnosis not present

## 2022-12-11 DIAGNOSIS — F32A Depression, unspecified: Secondary | ICD-10-CM | POA: Diagnosis not present

## 2022-12-11 DIAGNOSIS — F419 Anxiety disorder, unspecified: Secondary | ICD-10-CM | POA: Diagnosis not present

## 2022-12-11 DIAGNOSIS — Z01818 Encounter for other preprocedural examination: Secondary | ICD-10-CM | POA: Insufficient documentation

## 2022-12-11 DIAGNOSIS — E119 Type 2 diabetes mellitus without complications: Secondary | ICD-10-CM | POA: Insufficient documentation

## 2022-12-11 DIAGNOSIS — Z96612 Presence of left artificial shoulder joint: Secondary | ICD-10-CM | POA: Diagnosis not present

## 2022-12-11 DIAGNOSIS — I10 Essential (primary) hypertension: Secondary | ICD-10-CM | POA: Diagnosis not present

## 2022-12-11 DIAGNOSIS — M65812 Other synovitis and tenosynovitis, left shoulder: Secondary | ICD-10-CM | POA: Diagnosis not present

## 2022-12-11 DIAGNOSIS — G4730 Sleep apnea, unspecified: Secondary | ICD-10-CM | POA: Diagnosis not present

## 2022-12-11 DIAGNOSIS — M75102 Unspecified rotator cuff tear or rupture of left shoulder, not specified as traumatic: Secondary | ICD-10-CM | POA: Diagnosis not present

## 2022-12-11 DIAGNOSIS — M1990 Unspecified osteoarthritis, unspecified site: Secondary | ICD-10-CM | POA: Diagnosis not present

## 2022-12-11 DIAGNOSIS — Z7984 Long term (current) use of oral hypoglycemic drugs: Secondary | ICD-10-CM | POA: Diagnosis not present

## 2022-12-11 DIAGNOSIS — K219 Gastro-esophageal reflux disease without esophagitis: Secondary | ICD-10-CM | POA: Diagnosis not present

## 2022-12-11 LAB — BASIC METABOLIC PANEL
Anion gap: 7 (ref 5–15)
BUN: 14 mg/dL (ref 8–23)
CO2: 26 mmol/L (ref 22–32)
Calcium: 9.2 mg/dL (ref 8.9–10.3)
Chloride: 105 mmol/L (ref 98–111)
Creatinine, Ser: 0.76 mg/dL (ref 0.44–1.00)
GFR, Estimated: >60 mL/min (ref >60)
Glucose, Bld: 121 mg/dL — ABNORMAL HIGH (ref 70–99)
Potassium: 3.7 mmol/L (ref 3.5–5.1)
Sodium: 138 mmol/L (ref 135–145)

## 2022-12-11 NOTE — Telephone Encounter (Signed)
Spoke to Ms. Delford Field on the phone about her recent symptoms.  She has now had 3 episodes of losing balance and falling towards the right, spaced many weeks apart, with no loss of consciousness and no warning/prodrome, no dizziness/palpitations/chest pain/shortness of breath/pain, and feeling perfectly fine after standing back up without change in alertness/cognition.  Has not been experiencing any unsteady gait or difficulty with balance or navigation, no hearing or vision changes.  She feels perfectly well.  She is mostly curious that each episode involves falling to the right; twice occurred when walking, but a few days ago she fell to right from a standing still position.  Episodes do not sound consistent with a TIA or any cerebrovascular etiology, nor does it sound cardiac, hypotensive, or vestibular.  She questions whether leg/hip weakness (hx of prior  B THRs) could be playing a role.    She will make an appt with me for further evaluation following her outpatient shoulder surgery tomorrow, with which I believe she's ok to proceed.

## 2022-12-11 NOTE — Progress Notes (Signed)

## 2022-12-11 NOTE — Telephone Encounter (Signed)
Left VM - will send MyChart mssg acknowledging her message and expressing need for further evaluation.

## 2022-12-12 ENCOUNTER — Encounter: Payer: 59 | Admitting: Internal Medicine

## 2022-12-12 ENCOUNTER — Ambulatory Visit (HOSPITAL_BASED_OUTPATIENT_CLINIC_OR_DEPARTMENT_OTHER): Payer: 59 | Admitting: Certified Registered"

## 2022-12-12 ENCOUNTER — Other Ambulatory Visit (HOSPITAL_BASED_OUTPATIENT_CLINIC_OR_DEPARTMENT_OTHER): Payer: Self-pay | Admitting: Orthopaedic Surgery

## 2022-12-12 ENCOUNTER — Other Ambulatory Visit: Payer: Self-pay

## 2022-12-12 ENCOUNTER — Ambulatory Visit (HOSPITAL_BASED_OUTPATIENT_CLINIC_OR_DEPARTMENT_OTHER)
Admission: RE | Admit: 2022-12-12 | Discharge: 2022-12-12 | Disposition: A | Discharge status: Home / Self Care | Payer: 59 | Attending: Orthopaedic Surgery | Admitting: Orthopaedic Surgery

## 2022-12-12 ENCOUNTER — Encounter (HOSPITAL_BASED_OUTPATIENT_CLINIC_OR_DEPARTMENT_OTHER)
Admission: RE | Disposition: A | Discharge status: Home / Self Care | Payer: Self-pay | Source: Home / Self Care | Attending: Orthopaedic Surgery

## 2022-12-12 ENCOUNTER — Encounter (HOSPITAL_BASED_OUTPATIENT_CLINIC_OR_DEPARTMENT_OTHER): Payer: Self-pay | Admitting: Orthopaedic Surgery

## 2022-12-12 DIAGNOSIS — M75112 Incomplete rotator cuff tear or rupture of left shoulder, not specified as traumatic: Secondary | ICD-10-CM | POA: Diagnosis not present

## 2022-12-12 DIAGNOSIS — M12812 Other specific arthropathies, not elsewhere classified, left shoulder: Secondary | ICD-10-CM | POA: Diagnosis not present

## 2022-12-12 DIAGNOSIS — Z01818 Encounter for other preprocedural examination: Secondary | ICD-10-CM

## 2022-12-12 DIAGNOSIS — G8918 Other acute postprocedural pain: Secondary | ICD-10-CM | POA: Diagnosis not present

## 2022-12-12 DIAGNOSIS — E119 Type 2 diabetes mellitus without complications: Secondary | ICD-10-CM

## 2022-12-12 DIAGNOSIS — M65812 Other synovitis and tenosynovitis, left shoulder: Secondary | ICD-10-CM | POA: Diagnosis not present

## 2022-12-12 DIAGNOSIS — M1990 Unspecified osteoarthritis, unspecified site: Secondary | ICD-10-CM | POA: Diagnosis not present

## 2022-12-12 DIAGNOSIS — Z96612 Presence of left artificial shoulder joint: Secondary | ICD-10-CM | POA: Insufficient documentation

## 2022-12-12 DIAGNOSIS — Z7984 Long term (current) use of oral hypoglycemic drugs: Secondary | ICD-10-CM

## 2022-12-12 DIAGNOSIS — M75102 Unspecified rotator cuff tear or rupture of left shoulder, not specified as traumatic: Secondary | ICD-10-CM

## 2022-12-12 DIAGNOSIS — Z79899 Other long term (current) drug therapy: Secondary | ICD-10-CM | POA: Diagnosis not present

## 2022-12-12 DIAGNOSIS — I10 Essential (primary) hypertension: Secondary | ICD-10-CM | POA: Insufficient documentation

## 2022-12-12 DIAGNOSIS — G4730 Sleep apnea, unspecified: Secondary | ICD-10-CM | POA: Diagnosis not present

## 2022-12-12 DIAGNOSIS — F32A Depression, unspecified: Secondary | ICD-10-CM | POA: Diagnosis not present

## 2022-12-12 DIAGNOSIS — M75122 Complete rotator cuff tear or rupture of left shoulder, not specified as traumatic: Secondary | ICD-10-CM | POA: Diagnosis not present

## 2022-12-12 DIAGNOSIS — K219 Gastro-esophageal reflux disease without esophagitis: Secondary | ICD-10-CM | POA: Diagnosis not present

## 2022-12-12 DIAGNOSIS — F419 Anxiety disorder, unspecified: Secondary | ICD-10-CM | POA: Insufficient documentation

## 2022-12-12 HISTORY — PX: SHOULDER ARTHROSCOPY: SHX128

## 2022-12-12 HISTORY — DX: Other seasonal allergic rhinitis: J30.2

## 2022-12-12 LAB — GLUCOSE, CAPILLARY
Glucose-Capillary: 82 mg/dL (ref 70–99)
Glucose-Capillary: 98 mg/dL (ref 70–99)

## 2022-12-12 SURGERY — ARTHROSCOPY, SHOULDER
Anesthesia: General | Site: Shoulder | Laterality: Left

## 2022-12-12 MED ORDER — DEXAMETHASONE SODIUM PHOSPHATE 10 MG/ML IJ SOLN
INTRAMUSCULAR | Status: AC
  Filled 2022-12-12: qty 1

## 2022-12-12 MED ORDER — MIDAZOLAM HCL 2 MG/2ML IJ SOLN
1.0000 mg | Freq: Once | INTRAMUSCULAR | Status: AC
  Administered 2022-12-12: 1 mg via INTRAVENOUS

## 2022-12-12 MED ORDER — ACETAMINOPHEN 500 MG PO TABS
ORAL_TABLET | ORAL | Status: AC
  Filled 2022-12-12: qty 2

## 2022-12-12 MED ORDER — EPHEDRINE 5 MG/ML INJ
INTRAVENOUS | Status: AC
  Filled 2022-12-12: qty 5

## 2022-12-12 MED ORDER — FENTANYL CITRATE (PF) 100 MCG/2ML IJ SOLN
50.0000 ug | Freq: Once | INTRAMUSCULAR | Status: AC
  Administered 2022-12-12: 50 ug via INTRAVENOUS

## 2022-12-12 MED ORDER — ROCURONIUM BROMIDE 100 MG/10ML IV SOLN
INTRAVENOUS | Status: DC | PRN
  Administered 2022-12-12: 50 mg via INTRAVENOUS

## 2022-12-12 MED ORDER — PHENYLEPHRINE HCL (PRESSORS) 10 MG/ML IV SOLN
INTRAVENOUS | Status: DC | PRN
  Administered 2022-12-12 (×4): 80 ug via INTRAVENOUS

## 2022-12-12 MED ORDER — HYDROCODONE-ACETAMINOPHEN 5-325 MG PO TABS: 1.0000 | ORAL_TABLET | Freq: Four times a day (QID) | ORAL | 0 refills | Status: DC | PRN

## 2022-12-12 MED ORDER — LACTATED RINGERS IV SOLN: INTRAVENOUS | Status: DC

## 2022-12-12 MED ORDER — TRANEXAMIC ACID-NACL 1000-0.7 MG/100ML-% IV SOLN
INTRAVENOUS | Status: AC
  Filled 2022-12-12: qty 100

## 2022-12-12 MED ORDER — TRANEXAMIC ACID-NACL 1000-0.7 MG/100ML-% IV SOLN
1000.0000 mg | INTRAVENOUS | Status: AC
  Administered 2022-12-12: 1000 mg via INTRAVENOUS

## 2022-12-12 MED ORDER — ASPIRIN 325 MG PO TBEC: 325.0000 mg | DELAYED_RELEASE_TABLET | Freq: Every day | ORAL | 0 refills | Status: DC

## 2022-12-12 MED ORDER — PHENYLEPHRINE HCL (PRESSORS) 10 MG/ML IV SOLN
INTRAVENOUS | Status: AC
  Filled 2022-12-12: qty 1

## 2022-12-12 MED ORDER — ONDANSETRON HCL 4 MG/2ML IJ SOLN
INTRAMUSCULAR | Status: DC | PRN
  Administered 2022-12-12: 4 mg via INTRAVENOUS

## 2022-12-12 MED ORDER — BUPIVACAINE LIPOSOME 1.3 % IJ SUSP
INTRAMUSCULAR | Status: DC | PRN
  Administered 2022-12-12: 10 mL via PERINEURAL

## 2022-12-12 MED ORDER — CEFAZOLIN SODIUM-DEXTROSE 2-4 GM/100ML-% IV SOLN
INTRAVENOUS | Status: AC
  Filled 2022-12-12: qty 100

## 2022-12-12 MED ORDER — SUGAMMADEX SODIUM 500 MG/5ML IV SOLN
INTRAVENOUS | Status: AC
  Filled 2022-12-12: qty 5

## 2022-12-12 MED ORDER — FENTANYL CITRATE (PF) 100 MCG/2ML IJ SOLN
INTRAMUSCULAR | Status: DC | PRN
  Administered 2022-12-12: 50 ug via INTRAVENOUS

## 2022-12-12 MED ORDER — FENTANYL CITRATE (PF) 100 MCG/2ML IJ SOLN: 25.0000 ug | INTRAMUSCULAR | Status: DC | PRN

## 2022-12-12 MED ORDER — ROCURONIUM BROMIDE 10 MG/ML (PF) SYRINGE
PREFILLED_SYRINGE | INTRAVENOUS | Status: AC
  Filled 2022-12-12: qty 10

## 2022-12-12 MED ORDER — FENTANYL CITRATE (PF) 100 MCG/2ML IJ SOLN
INTRAMUSCULAR | Status: AC
  Filled 2022-12-12: qty 2

## 2022-12-12 MED ORDER — ACETAMINOPHEN 500 MG PO TABS
1000.0000 mg | ORAL_TABLET | Freq: Once | ORAL | Status: AC
  Administered 2022-12-12: 1000 mg via ORAL

## 2022-12-12 MED ORDER — SODIUM CHLORIDE 0.9 % IR SOLN
Status: DC | PRN
  Administered 2022-12-12: 3000 mL

## 2022-12-12 MED ORDER — CEFAZOLIN SODIUM-DEXTROSE 2-4 GM/100ML-% IV SOLN
2.0000 g | INTRAVENOUS | Status: AC
  Administered 2022-12-12: 2 g via INTRAVENOUS

## 2022-12-12 MED ORDER — PROPOFOL 10 MG/ML IV BOLUS
INTRAVENOUS | Status: AC
  Filled 2022-12-12: qty 20

## 2022-12-12 MED ORDER — EPHEDRINE SULFATE (PRESSORS) 50 MG/ML IJ SOLN
INTRAMUSCULAR | Status: DC | PRN
  Administered 2022-12-12: 5 mg via INTRAVENOUS

## 2022-12-12 MED ORDER — SUGAMMADEX SODIUM 500 MG/5ML IV SOLN
INTRAVENOUS | Status: DC | PRN
  Administered 2022-12-12: 400 mg via INTRAVENOUS

## 2022-12-12 MED ORDER — PROPOFOL 10 MG/ML IV BOLUS
INTRAVENOUS | Status: DC | PRN
  Administered 2022-12-12: 110 mg via INTRAVENOUS

## 2022-12-12 MED ORDER — PHENYLEPHRINE 80 MCG/ML (10ML) SYRINGE FOR IV PUSH (FOR BLOOD PRESSURE SUPPORT)
PREFILLED_SYRINGE | INTRAVENOUS | Status: AC
  Filled 2022-12-12: qty 10

## 2022-12-12 MED ORDER — ONDANSETRON HCL 4 MG/2ML IJ SOLN
INTRAMUSCULAR | Status: AC
  Filled 2022-12-12: qty 2

## 2022-12-12 MED ORDER — DEXAMETHASONE SODIUM PHOSPHATE 10 MG/ML IJ SOLN
INTRAMUSCULAR | Status: DC | PRN
  Administered 2022-12-12: 5 mg via INTRAVENOUS

## 2022-12-12 MED ORDER — BUPIVACAINE-EPINEPHRINE (PF) 0.5% -1:200000 IJ SOLN
INTRAMUSCULAR | Status: DC | PRN
  Administered 2022-12-12: 15 mL via PERINEURAL

## 2022-12-12 MED ORDER — MIDAZOLAM HCL 2 MG/2ML IJ SOLN
INTRAMUSCULAR | Status: AC
  Filled 2022-12-12: qty 2

## 2022-12-12 MED ORDER — GABAPENTIN 300 MG PO CAPS
ORAL_CAPSULE | ORAL | Status: AC
  Filled 2022-12-12: qty 1

## 2022-12-12 MED ORDER — GABAPENTIN 300 MG PO CAPS
300.0000 mg | ORAL_CAPSULE | Freq: Once | ORAL | Status: AC
  Administered 2022-12-12: 300 mg via ORAL

## 2022-12-12 SURGICAL SUPPLY — 65 items
AID PSTN UNV HD RSTRNT DISP (MISCELLANEOUS) ×1
APL PRP STRL LF DISP 70% ISPRP (MISCELLANEOUS) ×1
BLADE EXCALIBUR 4.0X13 (MISCELLANEOUS) ×1 IMPLANT
BURR OVAL 8 FLU 4.0X13 (MISCELLANEOUS) IMPLANT
CANNULA 5.75X71 LONG (CANNULA) IMPLANT
CANNULA 7X7 TWIST-IN (CANNULA) IMPLANT
CANNULA PASSPORT 5 (CANNULA) IMPLANT
CANNULA PASSPORT BUTTON 10-40 (CANNULA) IMPLANT
CANNULA TWIST IN 8.25X7CM (CANNULA) IMPLANT
CHLORAPREP W/TINT 26 (MISCELLANEOUS) ×2 IMPLANT
COOLER ICEMAN CLASSIC (MISCELLANEOUS) ×1 IMPLANT
DRAPE IMP U-DRAPE 54X76 (DRAPES) ×1 IMPLANT
DRAPE INCISE IOBAN 66X45 STRL (DRAPES) ×1 IMPLANT
DRAPE POUCH INSTRU U-SHP 10X18 (DRAPES) ×2 IMPLANT
DRAPE SHOULDER BEACH CHAIR (DRAPES) ×1 IMPLANT
DRAPE U-SHAPE 47X51 STRL (DRAPES) ×2 IMPLANT
DW OUTFLOW CASSETTE/TUBE SET (MISCELLANEOUS) ×1 IMPLANT
GAUZE PAD ABD 8X10 STRL (GAUZE/BANDAGES/DRESSINGS) ×1 IMPLANT
GAUZE SPONGE 4X4 12PLY STRL (GAUZE/BANDAGES/DRESSINGS) ×1 IMPLANT
GAUZE XEROFORM 1X8 LF (GAUZE/BANDAGES/DRESSINGS) ×1 IMPLANT
GLOVE BIO SURGEON STRL SZ 6 (GLOVE) ×2 IMPLANT
GLOVE BIO SURGEON STRL SZ7.5 (GLOVE) ×2 IMPLANT
GLOVE BIOGEL PI IND STRL 6.5 (GLOVE) ×1 IMPLANT
GLOVE BIOGEL PI IND STRL 8 (GLOVE) ×1 IMPLANT
GLOVE ECLIPSE 8.0 STRL XLNG CF (GLOVE) ×1 IMPLANT
GLOVE SURG SS PI 6.0 STRL IVOR (GLOVE) IMPLANT
GLOVE SURG SS PI 7.5 STRL IVOR (GLOVE) IMPLANT
GLOVE SURG SYN 7.5  E (GLOVE)
GLOVE SURG SYN 7.5 E (GLOVE) IMPLANT
GLOVE SURG SYN 7.5 PF PI (GLOVE) ×1 IMPLANT
GOWN STRL REUS W/ TWL LRG LVL3 (GOWN DISPOSABLE) ×4 IMPLANT
GOWN STRL REUS W/ TWL XL LVL3 (GOWN DISPOSABLE) ×1 IMPLANT
GOWN STRL REUS W/TWL LRG LVL3 (GOWN DISPOSABLE) ×2
GOWN STRL REUS W/TWL XL LVL3 (GOWN DISPOSABLE) ×2 IMPLANT
KIT STABILIZATION SHOULDER (MISCELLANEOUS) ×1 IMPLANT
KIT STR SPEAR 1.8 FBRTK DISP (KITS) IMPLANT
LASSO 90 CVE QUICKPAS (DISPOSABLE) IMPLANT
LASSO CRESCENT QUICKPASS (SUTURE) IMPLANT
MANIFOLD NEPTUNE II (INSTRUMENTS) ×1 IMPLANT
NDL HD SCORPION MEGA LOADER (NEEDLE) IMPLANT
NDL SAFETY ECLIP 18X1.5 (MISCELLANEOUS) ×1 IMPLANT
PACK ARTHROSCOPY DSU (CUSTOM PROCEDURE TRAY) ×1 IMPLANT
PACK BASIN DAY SURGERY FS (CUSTOM PROCEDURE TRAY) ×1 IMPLANT
PAD COLD SHLDR WRAP-ON (PAD) ×1 IMPLANT
PORT APPOLLO RF 90DEGREE MULTI (SURGICAL WAND) ×1 IMPLANT
RESTRAINT HEAD UNIVERSAL NS (MISCELLANEOUS) ×1 IMPLANT
SHEET MEDIUM DRAPE 40X70 STRL (DRAPES) IMPLANT
SLEEVE SCD COMPRESS KNEE MED (STOCKING) ×1 IMPLANT
SLING ARM FOAM STRAP LRG (SOFTGOODS) IMPLANT
SPACER SUBACROMIAL INSPACE MED (Spacer) IMPLANT
SUT ETHILON 3 0 PS 1 (SUTURE) ×1 IMPLANT
SUT FIBERWIRE #2 38 T-5 BLUE (SUTURE)
SUT PDS AB 1 CT  36 (SUTURE)
SUT PDS AB 1 CT 36 (SUTURE) IMPLANT
SUT TIGER TAPE 7 IN WHITE (SUTURE) IMPLANT
SUTURE FIBERWR #2 38 T-5 BLUE (SUTURE) IMPLANT
SUTURE TAPE 1.3 40 TPR END (SUTURE) IMPLANT
SUTURE TAPE TIGERLINK 1.3MM BL (SUTURE) IMPLANT
SUTURETAPE 1.3 40 TPR END (SUTURE)
SUTURETAPE TIGERLINK 1.3MM BL (SUTURE)
SYR 5ML LL (SYRINGE) ×1 IMPLANT
TAPE FIBER 2MM 7IN #2 BLUE (SUTURE) IMPLANT
TOWEL GREEN STERILE FF (TOWEL DISPOSABLE) ×2 IMPLANT
TUBE CONNECTING 20X1/4 (TUBING) ×1 IMPLANT
TUBING ARTHROSCOPY IRRIG 16FT (MISCELLANEOUS) ×1 IMPLANT

## 2022-12-12 NOTE — Brief Op Note (Signed)
   Brief Op Note  Date of Surgery: 12/12/2022  Preoperative Diagnosis: LEFT SHOULDER ROTATOR CUFF TEAR  Postoperative Diagnosis: same  Procedure: Procedure(s): LEFT SHOULDER ARTHROSCOPY WITH BALLOON SPACER  Implants: Implant Name Type Inv. Item Serial No. Manufacturer Lot No. LRB No. Used Action  [employer]     [id number] Left 1 Implanted    Surgeons: Surgeon(s): Huel Cote, MD  Anesthesia: General    Estimated Blood Loss: See anesthesia record  Complications: None  Condition to PACU: Stable  Benancio Deeds, MD 12/12/2022 10:42 AM

## 2022-12-12 NOTE — Progress Notes (Signed)
Assisted Dr. Edmond Fitzgerald with left, interscalene , ultrasound guided block. Side rails up, monitors on throughout procedure. See vital signs in flow sheet. Tolerated Procedure well. 

## 2022-12-12 NOTE — Transfer of Care (Signed)
Immediate Anesthesia Transfer of Care Note  Patient: Melissa [name]  Procedure(s) Performed: LEFT SHOULDER ARTHROSCOPY WITH BALLOON SPACER (Left: Shoulder)  Patient Location: PACU  Anesthesia Type:GA combined with regional for post-op pain  Level of Consciousness: drowsy  Airway & Oxygen Therapy: Patient Spontanous Breathing and Patient connected to face mask oxygen  Post-op Assessment: Report given to RN and Post -op Vital signs reviewed and stable  Post vital signs: Reviewed and stable  Last Vitals:  Vitals Value Taken Time  BP 167/96 12/12/22 1041  Temp    Pulse 90 12/12/22 1043  Resp 24 12/12/22 1043  SpO2 98 % 12/12/22 1043  Vitals shown include unvalidated device data.  Last Pain:  Vitals:   12/12/22 0716  TempSrc: Temporal  PainSc: 0-No pain         Complications: No notable events documented.

## 2022-12-12 NOTE — Anesthesia Procedure Notes (Signed)
Procedure Name: Intubation Date/Time: 12/12/2022 9:49 AM  Performed by: Lauralyn Primes, CRNAPre-anesthesia Checklist: Patient identified, Emergency Drugs available, Suction available and Patient being monitored Patient Re-evaluated:Patient Re-evaluated prior to induction Oxygen Delivery Method: Circle system utilized Preoxygenation: Pre-oxygenation with 100% oxygen Induction Type: IV induction Ventilation: Mask ventilation without difficulty Laryngoscope Size: Mac and 3 Grade View: Grade II Tube type: Oral Tube size: 7.0 mm Number of attempts: 1 Airway Equipment and Method: Stylet and Bite block Placement Confirmation: ETT inserted through vocal cords under direct vision, positive ETCO2 and breath sounds checked- equal and bilateral Secured at: 22 cm Tube secured with: Tape Dental Injury: Teeth and Oropharynx as per pre-operative assessment

## 2022-12-12 NOTE — Anesthesia Procedure Notes (Signed)
Anesthesia Regional Block: Interscalene brachial plexus block   Pre-Anesthetic Checklist: , timeout performed,  Correct Patient, Correct Site, Correct Laterality,  Correct Procedure, Correct Position, site marked,  Risks and benefits discussed,  Pre-op evaluation,  At surgeon's request and post-op pain management  Laterality: Left  Prep: Maximum Sterile Barrier Precautions used, chloraprep       Needles:  Injection technique: Single-shot  Needle Type: Echogenic Stimulator Needle     Needle Length: 5cm  Needle Gauge: 22     Additional Needles:   Procedures:,,,, ultrasound used (permanent image in chart),,    Narrative:  Start time: 12/12/2022 7:52 AM End time: 12/12/2022 8:02 AM Injection made incrementally with aspirations every 5 mL.  Performed by: Personally  Anesthesiologist: Gaynelle Adu, MD

## 2022-12-12 NOTE — Anesthesia Postprocedure Evaluation (Signed)
Anesthesia Post Note  Patient: Stephonie F Gasparyan  Procedure(s) Performed: LEFT SHOULDER ARTHROSCOPY WITH BALLOON SPACER (Left: Shoulder)     Patient location during evaluation: PACU Anesthesia Type: General and Regional Level of consciousness: awake and alert Pain management: pain level controlled Vital Signs Assessment: post-procedure vital signs reviewed and stable Respiratory status: spontaneous breathing, nonlabored ventilation and respiratory function stable Cardiovascular status: blood pressure returned to baseline and stable Postop Assessment: no apparent nausea or vomiting Anesthetic complications: no  No notable events documented.  Last Vitals:  Vitals:   12/12/22 1100 12/12/22 1115  BP: (!) 145/93 (!) 143/88  Pulse: 90 83  Resp: (!) 24 20  Temp:    SpO2: 95% 92%    Last Pain:  Vitals:   12/12/22 1100  TempSrc:   PainSc: 0-No pain                 Bona Hubbard,W. EDMOND

## 2022-12-12 NOTE — Anesthesia Preprocedure Evaluation (Addendum)
Anesthesia Evaluation  Patient identified by MRN, date of birth, ID band Patient awake    Reviewed: Allergy & Precautions, H&P , NPO status , Patient's Chart, lab work & pertinent test results  History of Anesthesia Complications (+) PONV and history of anesthetic complications  Airway Mallampati: II  TM Distance: >3 FB Neck ROM: Full    Dental no notable dental hx. (+) Teeth Intact, Dental Advisory Given   Pulmonary sleep apnea    Pulmonary exam normal breath sounds clear to auscultation       Cardiovascular hypertension, Pt. on medications  Rhythm:Regular Rate:Normal     Neuro/Psych   Anxiety Depression    negative neurological ROS     GI/Hepatic Neg liver ROS,GERD  Medicated,,  Endo/Other  diabetes, Type 2, Oral Hypoglycemic Agents    Renal/GU negative Renal ROS  negative genitourinary   Musculoskeletal  (+) Arthritis , Osteoarthritis,    Abdominal   Peds  Hematology negative hematology ROS (+)   Anesthesia Other Findings   Reproductive/Obstetrics negative OB ROS                             Anesthesia Physical Anesthesia Plan  ASA: 3  Anesthesia Plan: General   Post-op Pain Management: Regional block* and Tylenol PO (pre-op)*   Induction: Intravenous  PONV Risk Score and Plan: 4 or greater and Ondansetron, Dexamethasone and Treatment may vary due to age or medical condition  Airway Management Planned: Oral ETT  Additional Equipment:   Intra-op Plan:   Post-operative Plan: Extubation in OR  Informed Consent: I have reviewed the patients History and Physical, chart, labs and discussed the procedure including the risks, benefits and alternatives for the proposed anesthesia with the patient or authorized representative who has indicated his/her understanding and acceptance.     Dental advisory given  Plan Discussed with: CRNA  Anesthesia Plan Comments:         Anesthesia Quick Evaluation

## 2022-12-12 NOTE — Discharge Instructions (Addendum)
Discharge Instructions    Attending Surgeon: Huel CoteSteven Bokshan, MD Office Phone Number: [phone]   Diagnosis and Procedures:    Surgeries Performed: Left shoulder balloon placement  Discharge Plan:    Diet: Resume usual diet. Begin with light or bland foods.  Drink plenty of fluids.  Activity:  Keep sling and dressing in place until block wears off, you may be activity as tolerated following this. You are advised to go home directly from the hospital or surgical center. Restrict your activities.  GENERAL INSTRUCTIONS: 1.  Keep your surgical site elevated above your heart for at least 5-7 days or longer to prevent swelling. This will improve your comfort and your overall recovery following surgery.     2. Please call Dr. Serena CroissantBokshan's office at [phone] with questions Monday-Friday during business hours. If no one answers, please leave a message and someone should get back to the patient within 24 hours. For emergencies please call 911 or proceed to the emergency room.   3. Patient to notify surgical team if experiences any of the following: Bowel/Bladder dysfunction, uncontrolled pain, nerve/muscle weakness, incision with increased drainage or redness, nausea/vomiting and Fever greater than 101.0 F.  Be alert for signs of infection including redness, streaking, odor, fever or chills. Be alert for excessive pain or bleeding and notify your surgeon immediately.  WOUND INSTRUCTIONS:   Leave your dressing/cast/splint in place until your post operative visit.  Keep it clean and dry.  Always keep the incision clean and dry until the staples/sutures are removed. If there is no drainage from the incision you should keep it open to air. If there is drainage from the incision you must keep it covered at all times until the drainage stops  Do not soak in a bath tub, hot tub, pool, lake or other body of water until 21 days after your surgery and your incision is completely dry and  healed.  If you have removable sutures (or staples) they must be removed 10-14 days (unless otherwise instructed) from the day of your surgery.     1)  Elevate the extremity as much as possible.  2)  Keep the dressing clean and dry.  3)  Please call us if the dressing becomes wet or dirty.  4)  If you are experiencing worsening pain or worsening swelling, please call.     MEDICATIONS: Resume all previous home medications at the previous prescribed dose and frequency unless otherwise noted Start taking the  pain medications on an as-needed basis as prescribed  Please taper down pain medication over the next week following surgery.  Ideally you should not require a refill of any narcotic pain medication.  Take pain medication with food to minimize nausea. In addition to the prescribed pain medication, you may take over-the-counter pain relievers such as Tylenol.  Do NOT take additional tylenol if your pain medication already has tylenol in it.  Aspirin 325mg  daily for four weeks.      FOLLOWUP INSTRUCTIONS: 1. Follow up at the Physical Therapy Clinic 3-4 days following surgery. This appointment should be scheduled unless other arrangements have been made.The Physical Therapy scheduling number is [phone] if an appointment has not already been arranged.  2. Contact Dr. Serena CroissantBokshan's office during office hours at [phone] or the practice after hours line at [phone] for non-emergencies. For medical emergencies call 911.   Discharge Location: Home     Post Anesthesia Home Care Instructions  Activity: Get plenty of rest for the  remainder of the day. A responsible individual must stay with you for 24 hours following the procedure.  For the next 24 hours, DO NOT: -Drive a car -Advertising copywriter -Drink alcoholic beverages -Take any medication unless instructed by your physician -Make any legal decisions or sign important papers.  Meals: Start with liquid foods such as  gelatin or soup. Progress to regular foods as tolerated. Avoid greasy, spicy, heavy foods. If nausea and/or vomiting occur, drink only clear liquids until the nausea and/or vomiting subsides. Call your physician if vomiting continues.  Special Instructions/Symptoms: Your throat may feel dry or sore from the anesthesia or the breathing tube placed in your throat during surgery. If this causes discomfort, gargle with warm salt water. The discomfort should disappear within 24 hours.  If you had a scopolamine patch placed behind your ear for the management of post- operative nausea and/or vomiting:  1. The medication in the patch is effective for 72 hours, after which it should be removed.  Wrap patch in a tissue and discard in the trash. Wash hands thoroughly with soap and water. 2. You may remove the patch earlier than 72 hours if you experience unpleasant side effects which may include dry mouth, dizziness or visual disturbances. 3. Avoid touching the patch. Wash your hands with soap and water after contact with the patch.     Regional Anesthesia Blocks  1. Numbness or the inability to move the "blocked" extremity may last from 3-48 hours after placement. The length of time depends on the medication injected and your individual response to the medication. If the numbness is not going away after 48 hours, call your surgeon.  2. The extremity that is blocked will need to be protected until the numbness is gone and the  Strength has returned. Because you cannot feel it, you will need to take extra care to avoid injury. Because it may be weak, you may have difficulty moving it or using it. You may not know what position it is in without looking at it while the block is in effect.  3. For blocks in the legs and feet, returning to weight bearing and walking needs to be done carefully. You will need to wait until the numbness is entirely gone and the strength has returned. You should be able to move your  leg and foot normally before you try and bear weight or walk. You will need someone to be with you when you first try to ensure you do not fall and possibly risk injury.  4. Bruising and tenderness at the needle site are common side effects and will resolve in a few days.  5. Persistent numbness or new problems with movement should be communicated to the surgeon or the [facility] [phone] [facility] [phone]).  Information for Discharge Teaching: EXPAREL (bupivacaine liposome injectable suspension)   Your surgeon or anesthesiologist gave you EXPAREL(bupivacaine) to help control your pain after surgery.  EXPAREL is a local anesthetic that provides pain relief by numbing the tissue around the surgical site. EXPAREL is designed to release pain medication over time and can control pain for up to 72 hours. Depending on how you respond to EXPAREL, you may require less pain medication during your recovery.  Possible side effects: Temporary loss of sensation or ability to move in the area where bupivacaine was injected. Nausea, vomiting, constipation Rarely, numbness and tingling in your mouth or lips, lightheadedness, or anxiety may occur. Call your doctor right away if  you think you may be experiencing any of these sensations, or if you have other questions regarding possible side effects.  Follow all other discharge instructions given to you by your surgeon or nurse. Eat a healthy diet and drink plenty of water or other fluids.  If you return to the hospital for any reason within 96 hours following the administration of EXPAREL, it is important for health care providers to know that you have received this anesthetic. A teal colored band has been placed on your arm with the date, time and amount of EXPAREL you have received in order to alert and inform your health care providers. Please leave this armband in place for the full 96 hours following administration,  and then you may remove the band.  May have Tylenol today after 1:25 PM

## 2022-12-12 NOTE — H&P (Signed)
Chief Complaint: Bilateral shoulder pain        History of Present Illness:    11/08/2022: Presents today for follow-up of the MRI of the left shoulder.  Left shoulder continues to have limited motion and pain.  Unfortunately she did also have a fall for which she sustained a wound on the right leg which has been healing up well.  She is still having difficulty with overhead motion of the left shoulder.   Melissa [name] is a 72 y.o. female presents today for follow-up of both of her shoulders.  She is status post right reverse shoulder arthroplasty done at [facility] February 23, 2021.  Unfortunately she did have quite a significant reaction to the surgical glue that was used postoperatively.  As result she was unable to get physical therapy as a result of this prolonged recovery.  She presents today here for further follow-up and discussion.  Overall she feels like the right shoulder is stuck and quite limited in terms of motion.  She is continue to experience pain about the lateral aspect of the shoulder.  With regard to the left shoulder she did have a rotator cuf that was repaired 4 years prior.  She was doing very well until recently.  Over the last several months she has had pain and weakness with difficulty laying directly on the side.       Surgical History:   None   PMH/PSH/Family History/Social History/Meds/Allergies:         Past Medical History:  Diagnosis Date   Allergy     Anxiety     Arthritis      s/p R TKR   Cyst of left kidney 2015    by lumbar MRI pending renal US   DDD (degenerative disc disease), lumbar 03/2014    severe L2-3 with L HNP with L2/3 nerve root impingement [name] @ [facility])   Depression with anxiety     Diabetes type 2, controlled (HCC) 2011    borderline   Glaucoma     History of ulcer disease     HLD (hyperlipidemia)      no meds taken   Hypertension     OAB (overactive bladder) 10/14/2019   Osteoarthritis of spine with  radiculopathy, 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   Plantar fasciitis of left foot 10/01/2013   PONV (postoperative nausea and vomiting)     Primary osteoarthritis of left knee 07/11/2021   Right-sided face pain 01/07/2019   Rotator cuff tear 02/02/2017   Sleep apnea 2022    no cpap   Trigger middle finger of right hand 11/12/2018   Vitreous degeneration, unspecified eye 10/25/2012         Past Surgical History:  Procedure Laterality Date   ABDOMINAL HYSTERECTOMY   2007    fibroids, heavy bleeding   ARTERY BIOPSY Right 01/23/2019    Procedure: REMOVAL OF PART OF RIGHT TEMPORAL ARTERY FOR BIOPSY;  Surgeon: Karie Soda, MD;  Location: [facility] OR;  Service: General;  Laterality: Right;   CATARACT EXTRACTION W/ INTRAOCULAR LENS IMPLANT Bilateral     COLONOSCOPY   04/2017    TA, diverticulosis  ([facility])   GLAUCOMA SURGERY       HAMMER TOE SURGERY Bilateral     Right hip replacement       TONSILLECTOMY       TOTAL HIP ARTHROPLASTY Left 2021   TOTAL KNEE ARTHROPLASTY Right 2004    TKR (Dr. Renae Fickle with [facility])  TOTAL KNEE ARTHROPLASTY Left 07/11/2021    Procedure: LEFT TOTAL KNEE ARTHROPLASTY;  Surgeon: Tarry Kos, MD;  Location: [facility] OR;  Service: Orthopedics;  Laterality: Left;   TOTAL KNEE REVISION Right 03/2014    Dr Lamar Sprinkles [facility]   TOTAL SHOULDER ARTHROPLASTY Right 2022    Social History         Socioeconomic History   Marital status: Single      Spouse name: Not on file   Number of children: 0   Years of education: 16   Highest education level: Not on file  Occupational History   Occupation: work with disabilities/ Retired      Associate Professor: [employer]  Tobacco Use   Smoking status: Never   Smokeless tobacco: Never  Vaping Use   Vaping Use: Never used  Substance and Sexual Activity   Alcohol use: No   Drug use: No   Sexual activity: Not Currently      Birth control/protection: Surgical  Other Topics Concern   Not on file  Social History Narrative    Caffeine: rare     Lives alone    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[employer] [place]    Fun/Hobbies: Gardening     Social Determinants of Health        Financial Resource Strain: Low Risk  (12/01/2021)    Overall Financial Resource Strain (CARDIA)     Difficulty of Paying Living Expenses: Not hard at all  Food Insecurity: No Food Insecurity (12/01/2021)    Hunger Vital Sign     Worried About Running Out of Food in the Last Year: Never true     Ran Out of Food in the Last Year: Never true  Transportation Needs: No Transportation Needs (12/01/2021)    PRAPARE - Therapist, art (Medical): No     Lack of Transportation (Non-Medical): No  Physical Activity: Inactive (12/01/2021)    Exercise Vital Sign     Days of Exercise per Week: 0 days     Minutes of Exercise per Session: 0 min  Stress: Stress Concern Present (12/01/2021)    [employer] of Occupational Health - Occupational Stress Questionnaire     Feeling of Stress : Rather much  Social Connections: Moderately Integrated (12/01/2021)    Social Connection and Isolation Panel [NHANES]     Frequency of Communication with Friends and Family: Twice a week     Frequency of Social Gatherings with Friends and Family: Twice a week     Attends Religious Services: 1 to 4 times per year     Active Member of [employer] or Organizations: Yes     Attends Banker Meetings: Never     Marital Status: Never married         Family History  Problem Relation Age of Onset   Cancer Maternal Grandmother 49        ovarian   Diabetes Maternal Grandmother     Hypertension Maternal Grandmother     Cancer Mother 28        bone, MM   Stroke Other          unsure who   CAD Neg Hx     Colon cancer Neg Hx     Esophageal cancer Neg Hx     Rectal cancer Neg Hx     Stomach cancer Neg Hx           Allergies  Allergen Reactions   Codeine Other (  See Comments)      palpitations   Metformin And Related Nausea Only   Oxycodone Nausea  And Vomiting   Latex Dermatitis, Itching and Rash          Current Outpatient Medications  Medication Sig Dispense Refill   Accu-Chek FastClix Lancets MISC USE TO TEST BLOOD SUGAR UP TO FOUR TIMES DAILY 306 each 1   ACCU-CHEK GUIDE test strip USE AS DIRECTED FOUR TIMES DAILY 100 strip 11   acetaminophen (TYLENOL) 500 MG tablet Take 500 mg by mouth every 6 (six) hours as needed.       ALPRAZolam (XANAX) 0.25 MG tablet TAKE 1 TABLET(0.25 MG) BY MOUTH AT BEDTIME AS NEEDED FOR SLEEP 30 tablet 0   aspirin EC 81 MG tablet Take 81 mg by mouth daily. Swallow whole.       blood glucose meter kit and supplies KIT Dispense based on patient and insurance preference. Use up to four times daily as directed. DX Code: E11.9 1 each 0   Cholecalciferol (VITAMIN D3) 1.25 MG (50000 UT) TABS Take 50,000 Units by mouth once a week for 6 doses. 6 tablet 0   empagliflozin (JARDIANCE) 25 MG TABS tablet Take 1 tablet (25 mg total) by mouth daily before breakfast. 30 tablet 5   escitalopram (LEXAPRO) 10 MG tablet TAKE 1 TABLET(10 MG) BY MOUTH DAILY 90 tablet 3   hydrochlorothiazide (HYDRODIURIL) 25 MG tablet Take 1 tablet (25 mg total) by mouth daily. (Patient taking differently: Take 25 mg by mouth daily as needed (swelling).) 30 tablet 0   losartan (COZAAR) 50 MG tablet Take 1 tablet (50 mg total) by mouth daily. 30 tablet 5   omeprazole (PRILOSEC) 20 MG capsule Take 20 mg by mouth daily as needed (heartburn).       rosuvastatin (CRESTOR) 5 MG tablet Take 1 tablet (5 mg total) by mouth daily. 90 tablet 3   VYZULTA 0.024 % SOLN Place 1 drop into both eyes at bedtime.        No current facility-administered medications for this visit.    Imaging Results (Last 48 hours)  No results found.     Review of Systems:   A ROS was performed including pertinent positives and negatives as documented in the HPI.   Physical Exam :   Constitutional: NAD and appears stated age Neurological: Alert and oriented Psych:  Appropriate affect and cooperative There were no vitals taken for this visit.    Comprehensive Musculoskeletal Exam:     Musculoskeletal Exam      Inspection Right Left  Skin No atrophy or winging No atrophy or winging  Palpation      Tenderness Lateral deltoid Lateral deltoid  Range of Motion      Flexion (passive) 140 160  Flexion (active) 110 150  Abduction 100 1500  ER at the side 30 40  Can reach behind back to Sacrum L5  Strength        Weakness Positive weakness supraspinatus  Special Tests      Pseudoparalytic No No  Neurologic      Fires PIN, radial, median, ulnar, musculocutaneous, axillary, suprascapular, long thoracic, and spinal accessory innervated muscles. No abnormal sensibility  Vascular/Lymphatic      Radial Pulse 2+ 2+  Cervical Exam      Patient has symmetric cervical range of motion with negative Spurling's test.  Special Test: Right skin incision is well-healed, positive drop arm on the left with weakness  Imaging:   Xray (3 views right shoulder, 3 views left shoulder): She is status post right reverse shoulder arthroplasty without evidence of complication or hardware failure, with regard to the left shoulder she has hardware in place consistent with rotator cuff tear.  The glenohumeral space is maintained   MRI left shoulder: There is a full-thickness rotator cuff tear status post previous repair.  She has significant fatty atrophy with significant retraction   I personally reviewed and interpreted the radiographs.     Assessment:   72 year old female with right shoulder pain and weakness following reverse arthroplasty.  Overall I do believe and agree that her rehab was somewhat slower and limited as result of her skin complication from the Dermabond glue.  She does have a somewhat weak scapular muscle at this time.  With regard to the left shoulder she does have a full-thickness essentially nonrepairable rotator cuff tear on the side.  Given the  fact that she has previous arthroplasty and evidence of osteoarthritis on MRI I do believe that shoulder arthroplasty would be the optimal procedure for the left.  That being said she is hoping to defer any type of arthroplasty at this time.  Given that I did discuss more palliative options including an in space subacromial balloon.  Overall I did describe that this would be more of a temporary solution but would avoid the risk of needing an arthroplasty.  Given the fact that this is a lower morbidity procedure she would like to proceed with this.  I did discuss the risks and complications associated with this specifically.  This time she understands those and would like to proceed with arthroscopy and in space balloon placement Plan :     -Plan for left shoulder arthroscopy with subacromial balloon placement     After a lengthy discussion of treatment options, including risks, benefits, alternatives, complications of surgical and nonsurgical conservative options, the patient elected surgical repair.    The patient  is aware of the material risks  and complications including, but not limited to injury to adjacent structures, neurovascular injury, infection, numbness, bleeding, implant failure, thermal burns, stiffness, persistent pain, failure to heal, disease transmission from allograft, need for further surgery, dislocation, anesthetic risks, blood clots, risks of death,and others. The probabilities of surgical success and failure discussed with patient given their particular co-morbidities.The time and nature of expected rehabilitation and recovery was discussed.The patient's questions were all answered preoperatively.  No barriers to understanding were noted. I explained the natural history of the disease process and Rx rationale.  I explained to the patient what I considered to be reasonable expectations given their personal situation.  The final treatment plan was arrived at through a shared patient  decision making process model.             I personally saw and evaluated the patient, and participated in the management and treatment plan.   Huel Cote, MD Attending Physician, Orthopedic Surgery

## 2022-12-12 NOTE — Interval H&P Note (Signed)
History and Physical Interval Note:  12/12/2022 6:55 AM  Melissa [name]  has presented today for surgery, with the diagnosis of LEFT SHOULDER ROTATOR CUFF TEAR.  The various methods of treatment have been discussed with the patient and family. After consideration of risks, benefits and other options for treatment, the patient has consented to  Procedure(s): LEFT SHOULDER ARTHROSCOPY WITH BALLOON SPACER (Left) as a surgical intervention.  The patient's history has been reviewed, patient examined, no change in status, stable for surgery.  I have reviewed the patient's chart and labs.  Questions were answered to the patient's satisfaction.     Huel Cote

## 2022-12-12 NOTE — Op Note (Signed)
Date of Surgery: 12/12/2022  INDICATIONS: [name] is a 72 y.o.-year-old female with presents for left shoulder rotator cuff repair which has failed with early rotator cuff arthropathy.  The risk and benefits of the procedure were discussed in detail and documented in the pre-operative evaluation.   PREOPERATIVE DIAGNOSIS: 1.  Left shoulder early rotator cuff arthropathy  POSTOPERATIVE DIAGNOSIS: Same.  PROCEDURE: 1.  Left shoulder subacromial debridement and acromioplasty 2.  Left shoulder balloon inspace placement 3.  Left shoulder limited debridement  SURGEON: Benancio Deeds MD  ASSISTANT: Kerby Less, ATC  ANESTHESIA:  general  IV FLUIDS AND URINE: See anesthesia record.  ANTIBIOTICS: Ancef plus interscalene nerve block  ESTIMATED BLOOD LOSS: 10 mL.  IMPLANTS:  Implant Name Type Inv. Item Serial No. Manufacturer Lot No. LRB No. Used Action  Orthospace Inspace System     [id number] Left 1 Implanted    DRAINS: None  CULTURES: None  COMPLICATIONS: none  DESCRIPTION OF PROCEDURE:   Examination under anesthesia revealed forward elevation of 150 degrees.  With the arm at the side, there was 65 degrees of external rotation.  There is a 1+ anterior load shift and a 1+ posterior load shift.    Arthroscopic findings demonstrated: Articular space: Grade 4 central humeral head cartilage loss Biceps: Previous tenotomy type procedure not visualized Subscapularis: Intact Supraspinatus: Full-thickness tearing Infraspinatus: Intact    I identified the patient in the pre-operative holding area.  I marked the operative right shoulder with my initials. I reviewed the risks and benefits of the proposed surgical intervention and the patient wished to proceed.  Anesthesia was then performed with regional block.  The patient was transferred to the operative suite and placed in the beach chair position with all bony prominences padded.     SCDs were placed on bilateral lower  extremity. Appropriate antibiotics was administered within 1 hour before incision.  Anesthesia was induced.  The operative extremity was then prepped and draped in standard fashion. A time out was performed confirming the correct extremity, correct patient and correct procedure.   The arthroscope was introduced in the glenohumeral joint from a posterior portal.  An anterior portal was created.  The shoulder was examined and the above findings were noted.     With an arthroscopic shaver and a wand ablator, synovitis throughout the  shoulder was resected.  The arthroscopic shaver was used to excise torn portions of the labrum back to a stable margin. Specifically this was done for the anterior superior and posterior labrum.  A shaver was introduced into the lateral portal and used to debride the undersurface of the acromion and remove impinging osteophyte.  This was done in the subacromial space.  At this time the subacromial space was measured and the balloon device was inserted through the lateral portal.  The sheath was pulled back.  The balloon was inflated with 24 cc of saline.  This was sealed off.  This had excellent depression of the humeral head.  This opposed the previous rotator cuff tissue back to the humeral head nicely.   The shoulder was irrigated.  The arthroscopic instruments were removed.  Wounds were closed with 3-0 nylon sutures.  A sterile dressing was applied with xeroform, 4x8s, abdominal pad, and tape. An Flonnie Hailstone was placed and the upper extremity was placed in a shoulder immobilizer.  The patient tolerated the procedure well and was taken to the recovery room in stable condition.  All counts were correct in the case. The patient  tolerated the procedure well and was taken to the recovery room in stable condition.    POSTOPERATIVE PLAN: She will be weightbearing as tolerated on the left arm once her block wears off.  I will plan to see her back in 2 weeks for suture removal.  She will  be activity as tolerated.  I will place her on aspirin for blood clot prevention  Benancio Deeds, MD 10:43 AM

## 2022-12-13 ENCOUNTER — Encounter (HOSPITAL_BASED_OUTPATIENT_CLINIC_OR_DEPARTMENT_OTHER): Payer: Self-pay | Admitting: Orthopaedic Surgery

## 2022-12-15 ENCOUNTER — Ambulatory Visit (HOSPITAL_BASED_OUTPATIENT_CLINIC_OR_DEPARTMENT_OTHER): Payer: 59 | Admitting: Physical Therapy

## 2022-12-21 ENCOUNTER — Ambulatory Visit (HOSPITAL_BASED_OUTPATIENT_CLINIC_OR_DEPARTMENT_OTHER): Payer: 59 | Attending: Orthopaedic Surgery | Admitting: Physical Therapy

## 2022-12-21 DIAGNOSIS — M25611 Stiffness of right shoulder, not elsewhere classified: Secondary | ICD-10-CM

## 2022-12-21 DIAGNOSIS — M6281 Muscle weakness (generalized): Secondary | ICD-10-CM | POA: Diagnosis not present

## 2022-12-21 DIAGNOSIS — M75112 Incomplete rotator cuff tear or rupture of left shoulder, not specified as traumatic: Secondary | ICD-10-CM | POA: Diagnosis not present

## 2022-12-21 DIAGNOSIS — M25512 Pain in left shoulder: Secondary | ICD-10-CM | POA: Diagnosis not present

## 2022-12-21 NOTE — Therapy (Signed)
OUTPATIENT PHYSICAL THERAPY SHOULDER EVALUATION   Patient Name: Melissa [name] MRN: [id number] DOB:August 19, 1951, 72 y.o., female Today's Date: 12/21/2022  END OF SESSION:   Past Medical History:  Diagnosis Date   Allergy    Anxiety    Arthritis    s/p R TKR   Cyst of left kidney 2015   by lumbar MRI pending renal US   DDD (degenerative disc disease), lumbar 03/2014   severe L2-3 with L HNP with L2/3 nerve root impingement [name] @ [facility])   Depression with anxiety    Diabetes type 2, controlled 2011   borderline   Glaucoma    History of ulcer disease    HLD (hyperlipidemia)    no meds taken   Hypertension    OAB (overactive bladder) 10/14/2019   Osteoarthritis of spine with radiculopathy, 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   Plantar fasciitis of left foot 10/01/2013   PONV (postoperative nausea and vomiting)    Primary osteoarthritis of left knee 07/11/2021   Right-sided face pain 01/07/2019   Rotator cuff tear 02/02/2017   Seasonal allergies    Sleep apnea 2022   no cpap   Trigger middle finger of right hand 11/12/2018   Vitreous degeneration, unspecified eye 10/25/2012   Past Surgical History:  Procedure Laterality Date   ABDOMINAL HYSTERECTOMY  2007   fibroids, heavy bleeding   ARTERY BIOPSY Right 01/23/2019   Procedure: REMOVAL OF PART OF RIGHT TEMPORAL ARTERY FOR BIOPSY;  Surgeon: Karie Soda, MD;  Location: [facility];  Service: General;  Laterality: Right;   CATARACT EXTRACTION W/ INTRAOCULAR LENS IMPLANT Bilateral    COLONOSCOPY  04/2017   TA, diverticulosis  ([facility])   GLAUCOMA SURGERY     HAMMER TOE SURGERY Bilateral    Right hip replacement     SHOULDER ARTHROSCOPY Left 12/12/2022   Procedure: LEFT SHOULDER ARTHROSCOPY WITH BALLOON SPACER;  Surgeon: Huel Cote, MD;  Location: [facility];  Service: Orthopedics;  Laterality: Left;   TONSILLECTOMY     TOTAL HIP ARTHROPLASTY Left 2021   TOTAL KNEE ARTHROPLASTY Right 2004   TKR (Dr. 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   Decreased functional mobility 02/01/2022   Personal history of colonic polyps 01/26/2022   History of total knee replacement, bilateral 07/11/2021   Sleep apnea, intolerant of CPAP, declines 05/24/2021   Healthcare maintenance 01/07/2019   Chronic left shoulder pain 01/08/2018   History of bilateral hip replacements 07/19/2017   Acquired renal cyst of left kidney 01/01/2016   Spondylosis without myelopathy or radiculopathy, 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/2014   Obesity 11/13/2012   Glaucoma    Type 2 diabetes mellitus, controlled (HCC), without complications    Anxiety and depression    HTN (hypertension) 01/06/2012   GERD (gastroesophageal reflux disease) 01/06/2012   Hypercholesteremia 01/06/2012    PCP: Charissa Bash, MD  REFERRING PROVIDER: Dr Huel Cote   REFERRING DIAG: [phone] (ICD-10-CM) - Nontraumatic incomplete tear of left rotator cuff   THERAPY DIAG:  No diagnosis found.  Rationale for Evaluation and Treatment: Rehabilitation  ONSET DATE: 12/12/22  SUBJECTIVE:                                                                                                                                                                                      SUBJECTIVE STATEMENT: Patient is a 72 y/o women s/p 12/12/22 with left rotator cuff surgery. Patient is right hand dominant. Hand dominance: Right  PERTINENT HISTORY: Multijoint arthritis, lumbar DDD, glaucoma, remote  history of planta fasciitis, right shoulder replacement, right hip replacement, left total hip, bilateral total knees.  PAIN:  Are you having pain? Yes: NPRS scale: 4/10 Pain location: Left shoulder Pain description: Aching Aggravating factors: Use of the shoulder Relieving factors: Rest  PRECAUTIONS: None  WEIGHT BEARING RESTRICTIONS:   FALLS:  Has patient fallen in last 6 months? Yes. Number of falls 3 Falls towards her right LIVING ENVIRONMENT:   OCCUPATION: Would like to go back to work part-time  PLOF: Independent  PATIENT GOALS:Gardening and walking for exercise  NEXT MD VISIT: Tomorrow  OBJECTIVE:   DIAGNOSTIC FINDINGS:  Nothing postop  PATIENT SURVEYS:  FOTO    COGNITION: Overall cognitive status: Within functional limits for tasks assessed     SENSATION: WFL  POSTURE: Forward head rounded shoulders  UPPER EXTREMITY ROM:   PROM Right eval Left eval  Shoulder flexion   80  Shoulder extension    Shoulder abduction    Shoulder adduction    Shoulder internal rotation    Shoulder external rotation  15  Elbow flexion    Elbow extension    Wrist flexion    Wrist extension    Wrist ulnar deviation    Wrist radial deviation    Wrist pronation    Wrist supination    (Blank rows = not tested)  PROM Right eval Left eval  Shoulder flexion   50  Shoulder extension    Shoulder abduction    Shoulder adduction    Shoulder internal rotation  Unable to reach behind her back  Shoulder external rotation  Unable to reach behind her head  Elbow flexion    Elbow extension    Wrist flexion    Wrist extension    Wrist ulnar deviation    Wrist radial deviation    Wrist pronation    Wrist supination    (Blank rows = not tested)  UPPER EXTREMITY MMT:  MMT Right eval Left eval  Shoulder flexion    Shoulder extension    Shoulder abduction    Shoulder adduction    Shoulder internal rotation    Shoulder external rotation    Middle  trapezius     Lower trapezius    Elbow flexion    Elbow extension    Wrist flexion    Wrist extension    Wrist ulnar deviation    Wrist radial deviation    Wrist pronation    Wrist supination    Grip strength (lbs)    (Blank rows = not tested) not tested secondary to recent surgery  SHOULDER SPECIAL TESTS:   JOINT MOBILITY TESTING:    PALPATION:  Significant sensitivity to the anterior shoulder   TODAY'S TREATMENT:                                                                                                                                         DATE:   Eval: Wand ER 5 x 5-second hold  Wand press and available range 5 times scapular retraction 5 times Table slide 5 times  Manual: Passive range of motion and available ranges with focus on flexion and ER    PATIENT EDUCATION: Education details: HEP, POC, Pain management  Person educated: Patient Education method: Explanation, Demonstration, Tactile cues, Verbal cues, and Handouts Education comprehension: verbalized understanding, returned demonstration, verbal cues required, tactile cues required, and needs further education  HOME EXERCISE PROGRAM: Access Code: [email or web address] URL: [email or web address] Date: 12/21/2022 Prepared by: Lorayne Bender  Exercises - Supine Shoulder Press with Dowel  - 1 x daily - 7 x weekly - 3 sets - 10 reps - Seated Scapular Retraction  - 1 x daily - 7 x weekly - 3 sets - 10 reps - Seated Shoulder Flexion Towel Slide at Table Top  - 1 x daily - 7 x weekly - 3 sets - 10 reps - Supine Shoulder External Rotation with Dowel  - 1 x daily - 7 x weekly - 3 sets - 10 reps  ASSESSMENT:  CLINICAL IMPRESSION: Patient is a 72 year old female who is status post left shoulder balloon decompression on 12/12/2022.  She presents with expected limitations and range of motion strength and functional mobility.  She has significant tenderness to palpation in the anterior shoulder at this time.  She is using  some type of strap as a sling at this time.  Per MD she does not need to use her sling.  We will wean her as tolerated.  She would benefit from skilled therapy to improve functional use of left shoulder.  OBJECTIVE IMPAIRMENTS: Abnormal gait, decreased activity tolerance, decreased ROM, decreased strength, impaired UE functional use, and pain.   ACTIVITY LIMITATIONS: carrying, lifting, dressing, self feeding, and reach over head  PARTICIPATION LIMITATIONS: meal prep, cleaning, driving, shopping, community activity, occupation, and yard work  PERSONAL FACTORS: 3+ comorbidities: multi joint replacement; left RTC repair; lumbar DDD  are also affecting patient's functional outcome.   REHAB POTENTIAL: Excellent  CLINICAL DECISION MAKING: Stable/uncomplicated  EVALUATION COMPLEXITY: Low   GOALS: Goals reviewed with patient? Yes  SHORT TERM GOALS:  Target date: 01/18/2023    Patient will increase left shoulder passive flexion by 15 degrees Baseline: Goal status: INITIAL  2.  Patient will increase left shoulder passive external rotation by 10 degrees Baseline:  Goal status: INITIAL  3.  Patient will be independent with basic HEP Baseline:  Goal status: INITIAL   LONG TERM GOALS: Target date: 02/15/2023    Patient will reach behind her head without increased pain in order to perform ADLs Baseline:  Goal status: INITIAL  2.  Patient will reach behind her back without pain in order to perform ADLs Baseline:  Goal status: INITIAL  3.  Patient will reach overhead with 2 pound weight in order to perform daily tasks Baseline:  Goal status: INITIAL    PLAN:  PT FREQUENCY: 2x/week  PT DURATION: 8 weeks  PLANNED INTERVENTIONS: Therapeutic exercises, Therapeutic activity, Neuromuscular re-education, Balance training, Gait training, Patient/Family education, Self Care, Joint mobilization, Stair training, DME instructions, Aquatic Therapy, Cryotherapy, Moist heat, Taping, and  Manual therapy  PLAN FOR NEXT SESSION: Per MD follow standard shoulder decompression protocol.  Progress patient as tolerated.  Progress current HEP as tolerated next visit.   Dessie Coma, PT 12/21/2022, 12:45 PM

## 2022-12-22 ENCOUNTER — Ambulatory Visit (INDEPENDENT_AMBULATORY_CARE_PROVIDER_SITE_OTHER): Payer: 59 | Admitting: Orthopaedic Surgery

## 2022-12-22 DIAGNOSIS — M75112 Incomplete rotator cuff tear or rupture of left shoulder, not specified as traumatic: Secondary | ICD-10-CM

## 2022-12-22 NOTE — Progress Notes (Signed)
Post Operative Evaluation    Procedure/Date of Surgery: Left subacromial balloon spacer 4/9  Interval History:    Presents today 2 weeks status post left shoulder balloon spacer overall doing very well.  She has been working on regaining her motion.  She has been able to establish care with physical therapy.  She is having minimal to no pain.  Overall she is doing quite well at today's visit   PMH/PSH/Family History/Social History/Meds/Allergies:    Past Medical History:  Diagnosis Date   Allergy    Anxiety    Arthritis    s/p R TKR   Cyst of left kidney 2015   by lumbar MRI pending renal US   DDD (degenerative disc disease), lumbar 03/2014   severe L2-3 with L HNP with L2/3 nerve root impingement [name] @ [facility])   Depression with anxiety    Diabetes type 2, controlled 2011   borderline   Glaucoma    History of ulcer disease    HLD (hyperlipidemia)    no meds taken   Hypertension    OAB (overactive bladder) 10/14/2019   Osteoarthritis of spine with radiculopathy, 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   Plantar fasciitis of left foot 10/01/2013   PONV (postoperative nausea and vomiting)    Primary osteoarthritis of left knee 07/11/2021   Right-sided face pain 01/07/2019   Rotator cuff tear 02/02/2017   Seasonal allergies    Sleep apnea 2022   no cpap   Trigger middle finger of right hand 11/12/2018   Vitreous degeneration, unspecified eye 10/25/2012   Past Surgical History:  Procedure Laterality Date   ABDOMINAL HYSTERECTOMY  2007   fibroids, heavy bleeding   ARTERY BIOPSY Right 01/23/2019   Procedure: REMOVAL OF PART OF RIGHT TEMPORAL ARTERY FOR BIOPSY;  Surgeon: Karie Soda, MD;  Location: [facility];  Service: General;  Laterality: Right;   CATARACT EXTRACTION W/ INTRAOCULAR LENS IMPLANT Bilateral    COLONOSCOPY  04/2017   TA, diverticulosis  ([facility])   GLAUCOMA SURGERY     HAMMER TOE SURGERY Bilateral    Right hip replacement     SHOULDER  ARTHROSCOPY Left 12/12/2022   Procedure: LEFT SHOULDER ARTHROSCOPY WITH BALLOON SPACER;  Surgeon: Huel Cote, MD;  Location: [facility];  Service: Orthopedics;  Laterality: Left;   TONSILLECTOMY     TOTAL HIP ARTHROPLASTY Left 2021   TOTAL KNEE ARTHROPLASTY Right 2004   TKR (Dr. Renae Fickle with [facility])   TOTAL KNEE ARTHROPLASTY Left 07/11/2021   Procedure: LEFT TOTAL KNEE ARTHROPLASTY;  Surgeon: Tarry Kos, MD;  Location: [facility];  Service: Orthopedics;  Laterality: Left;   TOTAL KNEE REVISION Right 03/2014   Dr Lamar Sprinkles [facility]   TOTAL SHOULDER ARTHROPLASTY Right 2022   Social History   Socioeconomic History   Marital status: Single    Spouse name: Not on file   Number of children: 0   Years of education: 16   Highest education level: Not on file  Occupational History   Occupation: work with disabilities/ Retired    Associate Professor: [employer]  Tobacco Use   Smoking status: Never   Smokeless tobacco: Never  Vaping Use   Vaping Use: Never used  Substance and Sexual Activity   Alcohol use: No   Drug use: No   Sexual activity: Not Currently  Birth control/protection: Surgical  Other Topics Concern   Not on file  Social History Narrative   Caffeine: rare   Lives alone   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[employer]   Fun/Hobbies: Gardening    Social Determinants of Health   Financial Resource Strain: Low Risk  (12/01/2021)   Overall Financial Resource Strain (CARDIA)    Difficulty of Paying Living Expenses: Not hard at all  Food Insecurity: No Food Insecurity (12/01/2021)   Hunger Vital Sign    Worried About Running Out of Food in the Last Year: Never true    Ran Out of Food in the Last Year: Never true  Transportation Needs: No Transportation Needs (12/01/2021)   PRAPARE - Administrator, Civil Service (Medical): No    Lack of Transportation (Non-Medical): No  Physical Activity: Inactive (12/01/2021)   Exercise Vital Sign    Days of  Exercise per Week: 0 days    Minutes of Exercise per Session: 0 min  Stress: Stress Concern Present (12/01/2021)   [employer] of Occupational Health - Occupational Stress Questionnaire    Feeling of Stress : Rather much  Social Connections: Moderately Integrated (12/01/2021)   Social Connection and Isolation Panel [NHANES]    Frequency of Communication with Friends and Family: Twice a week    Frequency of Social Gatherings with Friends and Family: Twice a week    Attends Religious Services: 1 to 4 times per year    Active Member of [employer] or Organizations: Yes    Attends Banker Meetings: Never    Marital Status: Never married   Family History  Problem Relation Age of Onset   Cancer Maternal Grandmother 61       ovarian   Diabetes Maternal Grandmother    Hypertension Maternal Grandmother    Cancer Mother 37       bone, MM   Stroke Other        unsure who   CAD Neg Hx    Colon cancer Neg Hx    Esophageal cancer Neg Hx    Rectal cancer Neg Hx    Stomach cancer Neg Hx    Allergies  Allergen Reactions   Codeine Other (See Comments)    palpitations   Metformin And Related Nausea Only   Oxycodone Nausea And Vomiting   Latex Dermatitis, Itching and Rash   Current Outpatient Medications  Medication Sig Dispense Refill   Accu-Chek FastClix Lancets MISC USE TO TEST BLOOD SUGAR UP TO FOUR TIMES DAILY 306 each 1   ACCU-CHEK GUIDE test strip USE AS DIRECTED FOUR TIMES DAILY 100 strip 11   ALPRAZolam (XANAX) 0.25 MG tablet TAKE 1 TABLET(0.25 MG) BY MOUTH AT BEDTIME AS NEEDED FOR SLEEP 30 tablet 0   aspirin EC 325 MG tablet Take 1 tablet (325 mg total) by mouth daily. 14 tablet 0   blood glucose meter kit and supplies KIT Dispense based on patient and insurance preference. Use up to four times daily as directed. DX Code: E11.9 1 each 0   Dulaglutide (TRULICITY) 1.5 MG/0.5ML SOPN Inject 1.5 mg into the skin once a week. 6 mL 3   escitalopram (LEXAPRO) 10 MG tablet TAKE  1 TABLET(10 MG) BY MOUTH DAILY 90 tablet 3   esomeprazole (NEXIUM) 20 MG capsule Take 20 mg by mouth once a week.     HYDROcodone-acetaminophen (NORCO) 5-325 MG tablet Take 1 tablet by mouth every 6 (six) hours as needed for moderate pain. 30 tablet  0   losartan (COZAAR) 50 MG tablet Take 1 tablet (50 mg total) by mouth daily. 30 tablet 5   VYZULTA 0.024 % SOLN Place 1 drop into both eyes at bedtime.     No current facility-administered medications for this visit.   No results found.  Review of Systems:   A ROS was performed including pertinent positives and negatives as documented in the HPI.   Musculoskeletal Exam:    There were no vitals taken for this visit.  Left shoulder with well-appearing incisions.  She is able to forward elevate in the standing position to 90 degrees external rotation at side to 40 degrees.  Internal rotation is to L5.  2+ radial pulse  Imaging:      I personally reviewed and interpreted the radiographs.   Assessment:   2 weeks status post left shoulder balloon spacer overall doing well.  At this time she will continue to advance her activity as tolerated.  I would like her to work on active range of motion and strengthening.  I will plan to see her back in 4 weeks for reassessment  Plan :    -Return to clinic in 4 weeks for reassessment      I personally saw and evaluated the patient, and participated in the management and treatment plan.  Huel Cote, MD Attending Physician, Orthopedic Surgery  This document was dictated using Dragon voice recognition software. A reasonable attempt at proof reading has been made to minimize errors.

## 2022-12-25 ENCOUNTER — Encounter (HOSPITAL_BASED_OUTPATIENT_CLINIC_OR_DEPARTMENT_OTHER): Payer: Self-pay

## 2022-12-25 ENCOUNTER — Encounter (HOSPITAL_BASED_OUTPATIENT_CLINIC_OR_DEPARTMENT_OTHER): Payer: 59 | Admitting: Physical Therapy

## 2022-12-26 ENCOUNTER — Ambulatory Visit (INDEPENDENT_AMBULATORY_CARE_PROVIDER_SITE_OTHER): Payer: 59 | Admitting: Licensed Clinical Social Worker

## 2022-12-26 DIAGNOSIS — F419 Anxiety disorder, unspecified: Secondary | ICD-10-CM

## 2022-12-26 DIAGNOSIS — F32A Depression, unspecified: Secondary | ICD-10-CM

## 2022-12-26 NOTE — BH Specialist Note (Signed)
Integrated Behavioral Health via Telemedicine Visit  12/26/2022 Melissa [name] [id number]  Number of Integrated Behavioral Health Clinician visits: 2- Second Visit  Session Start time: 1430   Session End time: 1530  Total time in minutes: 60    I connected with Seraphine Charlean Merl   Telephone or Video Enabled Telemedicine Application  (Video is Surveyor, mining) and verified that I am speaking with the correct person using two identifiers. Discussed confidentiality: Yes   Types of Service: Individual psychotherapy and General Behavioral Integrated Care (BHI)   Interpretor:No. Interpretor Name and Language: N/A     Subjective: Melissa [name] is a 72 y.o. female  Patient was referred by PCP for Anxiety/Depression. Patient reports the following symptoms/concerns: Low mood, Rapid pace when speaking with others and inability to control interrupting others.  Duration of problem: 1 year; Severity of problem: severe   Objective: Mood: Anxious and Affect: Appropriate Risk of harm to self or others: No plan to harm self or others   Life Context:   Self-Care: Read, Walking and [employer] [place], Faith in God    Patient and/or Family's Strengths/Protective Factors: Concrete supports in place (healthy food, safe environments, etc.) and Sense of purpose   Goals Addressed: Patient will: Reduce symptoms of: anxiety   Progress towards Goals: Ongoing   Interventions: Interventions utilized: Mindfulness or Relaxation Training and Supportive Counseling  Standardized Assessments completed: PHQ-SADS     Assessment: Patient is recovering from shoulder surgery. Patient has upcoming oral surgery. Patient expressed mild depression due to health and inability to complete planned tasks. Patient continues to use deep breathing and counting to calm anxiety. Patient enjoys providers and speaking with therapist. Patient would like ongoing therapy..   Patient may benefit from  Mindfulness or Relaxation Training and Supportive Counseling.    I discussed the assessment and treatment plan with the patient and/or parent/guardian. They were provided an opportunity to ask questions and all were answered. They agreed with the plan and demonstrated an understanding of the instructions.   They were advised to call back or seek an in-person evaluation if the symptoms worsen or if the condition fails to improve as anticipated.  Christen Butter, MSW, LCSW-A She/Her Behavioral Health Clinician [facility]  Internal Medicine Center Direct Dial:[phone]  Fax [phone] Main Office Phone: [phone] [address] [place] [place], [place] [address] Website: [facility] Internal Medicine [facility]  [place], [place]  [place]

## 2022-12-28 ENCOUNTER — Encounter: Payer: 59 | Admitting: Internal Medicine

## 2022-12-29 ENCOUNTER — Encounter (HOSPITAL_BASED_OUTPATIENT_CLINIC_OR_DEPARTMENT_OTHER): Payer: 59

## 2023-01-05 ENCOUNTER — Encounter (HOSPITAL_BASED_OUTPATIENT_CLINIC_OR_DEPARTMENT_OTHER): Payer: Self-pay

## 2023-01-05 ENCOUNTER — Ambulatory Visit (HOSPITAL_BASED_OUTPATIENT_CLINIC_OR_DEPARTMENT_OTHER): Payer: 59 | Attending: Orthopaedic Surgery

## 2023-01-05 DIAGNOSIS — M6281 Muscle weakness (generalized): Secondary | ICD-10-CM | POA: Diagnosis not present

## 2023-01-05 DIAGNOSIS — M25611 Stiffness of right shoulder, not elsewhere classified: Secondary | ICD-10-CM | POA: Insufficient documentation

## 2023-01-05 DIAGNOSIS — M25512 Pain in left shoulder: Secondary | ICD-10-CM | POA: Insufficient documentation

## 2023-01-05 NOTE — Therapy (Signed)
OUTPATIENT PHYSICAL THERAPY SHOULDER TREATMENT   Patient Name: Melissa [name] MRN: [id number] DOB:[birth year], 72 y.o., female Today's Date: 01/05/2023  END OF SESSION:  PT End of Session - 01/05/23 1259     Visit Number 2    Number of Visits 16    Date for PT Re-Evaluation 02/15/23    PT Start Time 1303    PT Stop Time 1345    PT Time Calculation (min) 42 min    Activity Tolerance Patient tolerated treatment well    Behavior During Therapy [facility] for tasks assessed/performed             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   Plantar fasciitis of left foot 10/01/2013   PONV (postoperative nausea and vomiting)    Primary osteoarthritis of left knee 07/11/2021   Right-sided face pain 01/07/2019   Rotator cuff tear 02/02/2017   Seasonal allergies    Sleep apnea 2022   no cpap   Trigger middle finger of right hand 11/12/2018   Vitreous degeneration, unspecified eye 10/25/2012   Past Surgical History:  Procedure Laterality Date   ABDOMINAL HYSTERECTOMY  2007   fibroids, heavy bleeding   ARTERY BIOPSY Right 01/23/2019   Procedure: REMOVAL OF PART OF RIGHT TEMPORAL ARTERY FOR BIOPSY;  Surgeon: Karie Soda, MD;  Location: [facility] OR;  Service: General;  Laterality: Right;   CATARACT EXTRACTION W/ INTRAOCULAR LENS IMPLANT Bilateral    COLONOSCOPY  04/2017   TA, diverticulosis  ([facility])   GLAUCOMA SURGERY     HAMMER TOE SURGERY Bilateral    Right hip replacement      SHOULDER ARTHROSCOPY Left 12/12/2022   Procedure: LEFT SHOULDER ARTHROSCOPY WITH BALLOON SPACER;  Surgeon: Huel Cote, MD;  Location: [facility];  Service: Orthopedics;  Laterality: Left;   TONSILLECTOMY     TOTAL HIP ARTHROPLASTY Left 2021   TOTAL KNEE ARTHROPLASTY Right 2004   TKR (Dr. 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   Decreased functional mobility 02/01/2022   Personal history of colonic polyps 01/26/2022   History of total knee replacement, bilateral 07/11/2021   Sleep apnea, intolerant of CPAP, declines 05/24/2021   Healthcare maintenance 01/07/2019   Chronic left shoulder pain 01/08/2018   History of bilateral hip replacements 07/19/2017   Acquired renal cyst of left kidney 01/01/2016  Spondylosis without myelopathy or radiculopathy, 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/2014   Obesity 11/13/2012   Glaucoma    Type 2 diabetes mellitus, controlled (HCC), without complications    Anxiety and depression    HTN (hypertension) 01/06/2012   GERD (gastroesophageal reflux disease) 01/06/2012   Hypercholesteremia 01/06/2012    PCP: Charissa Bash, MD  REFERRING PROVIDER: Dr Huel Cote   REFERRING DIAG: [phone] (ICD-10-CM) - Nontraumatic incomplete tear of left rotator cuff   THERAPY DIAG:  Acute pain of left shoulder  Stiffness of right shoulder, not elsewhere classified  Muscle weakness (generalized)  Rationale for Evaluation and Treatment: Rehabilitation  ONSET  DATE: 12/12/22  SUBJECTIVE:                                                                                                                                                                                      SUBJECTIVE STATEMENT: Pt reports shoulder has been doing well. No pain at entry.  Hand dominance: Right  PERTINENT HISTORY: Multijoint arthritis, lumbar DDD, glaucoma, remote history of planta fasciitis, right shoulder replacement, right hip replacement, left total hip, bilateral total knees.  PAIN:  Are you having pain? No: NPRS scale: 0/10 Pain location: Left shoulder Pain description: Aching Aggravating factors: Use of the shoulder Relieving factors: Rest  PRECAUTIONS: None  WEIGHT BEARING RESTRICTIONS:   FALLS:  Has patient fallen in last 6 months? Yes. Number of falls 3 Falls towards her right LIVING ENVIRONMENT:   OCCUPATION: Would like to go back to work part-time  PLOF: Independent  PATIENT GOALS:Gardening and walking for exercise  NEXT MD VISIT: Tomorrow  OBJECTIVE:   DIAGNOSTIC FINDINGS:  Nothing postop  PATIENT SURVEYS:  FOTO    COGNITION: Overall cognitive status: Within functional limits for tasks assessed     SENSATION: WFL  POSTURE: Forward head rounded shoulders  UPPER EXTREMITY ROM:   PROM Right eval Left eval  Shoulder flexion   80  Shoulder extension    Shoulder abduction    Shoulder adduction    Shoulder internal rotation    Shoulder external rotation  15  Elbow flexion    Elbow extension    Wrist flexion    Wrist extension    Wrist ulnar deviation    Wrist radial deviation    Wrist pronation    Wrist supination    (Blank rows = not tested)  PROM Right eval Left eval  Shoulder flexion   50  Shoulder extension    Shoulder abduction    Shoulder adduction    Shoulder internal rotation  Unable to reach behind her back  Shoulder external rotation  Unable to reach behind her head  Elbow flexion  Elbow extension     Wrist flexion    Wrist extension    Wrist ulnar deviation    Wrist radial deviation    Wrist pronation    Wrist supination    (Blank rows = not tested)  UPPER EXTREMITY MMT:  MMT Right eval Left eval  Shoulder flexion    Shoulder extension    Shoulder abduction    Shoulder adduction    Shoulder internal rotation    Shoulder external rotation    Middle trapezius    Lower trapezius    Elbow flexion    Elbow extension    Wrist flexion    Wrist extension    Wrist ulnar deviation    Wrist radial deviation    Wrist pronation    Wrist supination    Grip strength (lbs)    (Blank rows = not tested) not tested secondary to recent surgery  SHOULDER SPECIAL TESTS:   JOINT MOBILITY TESTING:    PALPATION:  Significant sensitivity to the anterior shoulder   TODAY'S TREATMENT:                                                                                                                                         DATE:   01/05/23 -PROM -GHJ mobilizations -Wand flexion supine -active L shoulder flexion supine 2x5 -Sidelying ER x10 -Sidelying abd x10    PATIENT EDUCATION: Education details: HEP, POC, Pain management  Person educated: Patient Education method: Explanation, Demonstration, Tactile cues, Verbal cues, and Handouts Education comprehension: verbalized understanding, returned demonstration, verbal cues required, tactile cues required, and needs further education  HOME EXERCISE PROGRAM: Access Code: [email or web address] URL: [email or web address] Date: 12/21/2022 Prepared by: Lorayne Bender  Exercises - Supine Shoulder Press with Dowel  - 1 x daily - 7 x weekly - 3 sets - 10 reps - Seated Scapular Retraction  - 1 x daily - 7 x weekly - 3 sets - 10 reps - Seated Shoulder Flexion Towel Slide at Table Top  - 1 x daily - 7 x weekly - 3 sets - 10 reps - Supine Shoulder External Rotation with Dowel  - 1 x daily - 7 x weekly - 3 sets - 10  reps  ASSESSMENT:  CLINICAL IMPRESSION: Patient stiff into end ranges, but good overall tolerance for PROM. She was able to initiate AROM in gravity assisted positions without complaint, though she did utilize slow pace for exercise completion. Will continue to progress strengthening and ROM as tolerated.   OBJECTIVE IMPAIRMENTS: Abnormal gait, decreased activity tolerance, decreased ROM, decreased strength, impaired UE functional use, and pain.   ACTIVITY LIMITATIONS: carrying, lifting, dressing, self feeding, and reach over head  PARTICIPATION LIMITATIONS: meal prep, cleaning, driving, shopping, community activity, occupation, and yard work  PERSONAL FACTORS: 3+ comorbidities: multi joint replacement; left RTC repair; lumbar DDD  are also affecting patient's functional outcome.   REHAB POTENTIAL: Excellent  CLINICAL DECISION MAKING: Stable/uncomplicated  EVALUATION COMPLEXITY: Low   GOALS: Goals reviewed with patient? Yes  SHORT TERM GOALS: Target date: 01/18/2023    Patient will increase left shoulder passive flexion by 15 degrees Baseline: Goal status: INITIAL  2.  Patient will increase left shoulder passive external rotation by 10 degrees Baseline:  Goal status: INITIAL  3.  Patient will be independent with basic HEP Baseline:  Goal status: INITIAL   LONG TERM GOALS: Target date: 02/15/2023    Patient will reach behind her head without increased pain in order to perform ADLs Baseline:  Goal status: INITIAL  2.  Patient will reach behind her back without pain in order to perform ADLs Baseline:  Goal status: INITIAL  3.  Patient will reach overhead with 2 pound weight in order to perform daily tasks Baseline:  Goal status: INITIAL    PLAN:  PT FREQUENCY: 2x/week  PT DURATION: 8 weeks  PLANNED INTERVENTIONS: Therapeutic exercises, Therapeutic activity, Neuromuscular re-education, Balance training, Gait training, Patient/Family education, Self Care,  Joint mobilization, Stair training, DME instructions, Aquatic Therapy, Cryotherapy, Moist heat, Taping, and Manual therapy  PLAN FOR NEXT SESSION: Per MD follow standard shoulder decompression protocol.  Progress patient as tolerated.  Progress current HEP as tolerated next visit.   Donnel Saxon Korbin Mapps, PTA 01/05/2023, 2:15 PM

## 2023-01-08 ENCOUNTER — Ambulatory Visit: Payer: 59

## 2023-01-09 ENCOUNTER — Encounter (HOSPITAL_BASED_OUTPATIENT_CLINIC_OR_DEPARTMENT_OTHER): Payer: Self-pay

## 2023-01-09 ENCOUNTER — Ambulatory Visit (HOSPITAL_BASED_OUTPATIENT_CLINIC_OR_DEPARTMENT_OTHER): Payer: 59 | Admitting: Physical Therapy

## 2023-01-12 ENCOUNTER — Ambulatory Visit (HOSPITAL_BASED_OUTPATIENT_CLINIC_OR_DEPARTMENT_OTHER): Payer: 59

## 2023-01-12 ENCOUNTER — Encounter (HOSPITAL_BASED_OUTPATIENT_CLINIC_OR_DEPARTMENT_OTHER): Payer: Self-pay

## 2023-01-12 DIAGNOSIS — M25512 Pain in left shoulder: Secondary | ICD-10-CM

## 2023-01-12 DIAGNOSIS — M25611 Stiffness of right shoulder, not elsewhere classified: Secondary | ICD-10-CM | POA: Diagnosis not present

## 2023-01-12 DIAGNOSIS — M6281 Muscle weakness (generalized): Secondary | ICD-10-CM

## 2023-01-12 NOTE — Therapy (Signed)
OUTPATIENT PHYSICAL THERAPY SHOULDER TREATMENT   Patient Name: Melissa [name] MRN: [id number] DOB:1950/10/18, 72 y.o., female Today's Date: 01/12/2023  END OF SESSION:  PT End of Session - 01/12/23 1244     Visit Number 3    Number of Visits 16    Date for PT Re-Evaluation 02/15/23    PT Start Time 1146    PT Stop Time 1228    PT Time Calculation (min) 42 min    Activity Tolerance Patient tolerated treatment well    Behavior During Therapy [facility] for tasks assessed/performed              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   Plantar fasciitis of left foot 10/01/2013   PONV (postoperative nausea and vomiting)    Primary osteoarthritis of left knee 07/11/2021   Right-sided face pain 01/07/2019   Rotator cuff tear 02/02/2017   Seasonal allergies    Sleep apnea 2022   no cpap   Trigger middle finger of right hand 11/12/2018   Vitreous degeneration, unspecified eye 10/25/2012   Past Surgical History:  Procedure Laterality Date   ABDOMINAL HYSTERECTOMY  2007   fibroids, heavy bleeding   ARTERY BIOPSY Right 01/23/2019   Procedure: REMOVAL OF PART OF RIGHT TEMPORAL ARTERY FOR BIOPSY;  Surgeon: Karie Soda, MD;  Location: [facility] OR;  Service: General;  Laterality: Right;   CATARACT EXTRACTION W/ INTRAOCULAR LENS IMPLANT Bilateral    COLONOSCOPY  04/2017   TA, diverticulosis  ([facility])   GLAUCOMA SURGERY     HAMMER TOE SURGERY Bilateral    Right hip  replacement     SHOULDER ARTHROSCOPY Left 12/12/2022   Procedure: LEFT SHOULDER ARTHROSCOPY WITH BALLOON SPACER;  Surgeon: Huel Cote, MD;  Location: [facility];  Service: Orthopedics;  Laterality: Left;   TONSILLECTOMY     TOTAL HIP ARTHROPLASTY Left 2021   TOTAL KNEE ARTHROPLASTY Right 2004   TKR (Dr. 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   Decreased functional mobility 02/01/2022   Personal history of colonic polyps 01/26/2022   History of total knee replacement, bilateral 07/11/2021   Sleep apnea, intolerant of CPAP, declines 05/24/2021   Healthcare maintenance 01/07/2019   Chronic left shoulder pain 01/08/2018   History of bilateral hip replacements 07/19/2017   Acquired renal cyst of left kidney  01/01/2016   Spondylosis without myelopathy or radiculopathy, 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/2014   Obesity 11/13/2012   Glaucoma    Type 2 diabetes mellitus, controlled (HCC), without complications    Anxiety and depression    HTN (hypertension) 01/06/2012   GERD (gastroesophageal reflux disease) 01/06/2012   Hypercholesteremia 01/06/2012    PCP: Charissa Bash, MD  REFERRING PROVIDER: Dr Huel Cote   REFERRING DIAG: [phone] (ICD-10-CM) - Nontraumatic incomplete tear of left rotator cuff   THERAPY DIAG:  Acute pain of left shoulder  Stiffness of right shoulder, not elsewhere classified  Muscle weakness (generalized)  Rationale for Evaluation and Treatment:  Rehabilitation  ONSET DATE: 12/12/22  SUBJECTIVE:                                                                                                                                                                                      SUBJECTIVE STATEMENT: Pt reports she tried to lift a case of water and her shoulder hurt from this. Had some swelling after this too. Still hurts today, but feeling better.  1/10 pain level at rest, increases with movement.    Hand dominance: Right  PERTINENT HISTORY: Multijoint arthritis, lumbar DDD, glaucoma, remote history of planta fasciitis, right shoulder replacement, right hip replacement, left total hip, bilateral total knees.  PAIN:  Are you having pain? Yes: NPRS scale: 1/10 Pain location: Left shoulder Pain description: Aching Aggravating factors: Use of the shoulder Relieving factors: Rest  PRECAUTIONS: None  WEIGHT BEARING RESTRICTIONS:   FALLS:  Has patient fallen in last 6 months? Yes. Number of falls 3 Falls towards her right LIVING ENVIRONMENT:   OCCUPATION: Would like to go back to work part-time  PLOF: Independent  PATIENT GOALS:Gardening and walking for exercise  NEXT MD VISIT: Tomorrow  OBJECTIVE:   DIAGNOSTIC FINDINGS:  Nothing postop  PATIENT SURVEYS:  FOTO    COGNITION: Overall cognitive status: Within functional limits for tasks assessed     SENSATION: WFL  POSTURE: Forward head rounded shoulders  UPPER EXTREMITY ROM:   PROM Right eval Left eval  Shoulder flexion   80  Shoulder extension    Shoulder abduction    Shoulder adduction    Shoulder internal rotation    Shoulder external rotation  15  Elbow flexion    Elbow extension    Wrist flexion    Wrist extension    Wrist ulnar deviation    Wrist radial deviation    Wrist pronation    Wrist supination    (Blank rows = not tested)  PROM Right eval Left eval  Shoulder flexion   50  Shoulder extension    Shoulder abduction  Shoulder  adduction    Shoulder internal rotation  Unable to reach behind her back  Shoulder external rotation  Unable to reach behind her head  Elbow flexion    Elbow extension    Wrist flexion    Wrist extension    Wrist ulnar deviation    Wrist radial deviation    Wrist pronation    Wrist supination    (Blank rows = not tested)  UPPER EXTREMITY MMT:  MMT Right eval Left eval  Shoulder flexion    Shoulder extension    Shoulder abduction    Shoulder adduction    Shoulder internal rotation    Shoulder external rotation    Middle trapezius    Lower trapezius    Elbow flexion    Elbow extension    Wrist flexion    Wrist extension    Wrist ulnar deviation    Wrist radial deviation    Wrist pronation    Wrist supination    Grip strength (lbs)    (Blank rows = not tested) not tested secondary to recent surgery  SHOULDER SPECIAL TESTS:   JOINT MOBILITY TESTING:    PALPATION:  Significant sensitivity to the anterior shoulder   TODAY'S TREATMENT:                                                                                                                                         DATE:   01/12/23 -PROM -Wand flexion supine 2x10 -active L shoulder flexion supine x10 -seated scap retraction x15- 5 sec hold -Seatde elbow flexion 2x10 -Pulleys 2' flexion -ball rolls at table x20 flexion  01/05/23 -PROM -GHJ mobilizations -Wand flexion supine -active L shoulder flexion supine 2x5 -Sidelying ER x10 -Sidelying abd x10    PATIENT EDUCATION: Education details: HEP, POC, Pain management  Person educated: Patient Education method: Explanation, Demonstration, Tactile cues, Verbal cues, and Handouts Education comprehension: verbalized understanding, returned demonstration, verbal cues required, tactile cues required, and needs further education  HOME EXERCISE PROGRAM: Access Code: [email or web address] URL: [email or web address] Date: 12/21/2022 Prepared by: Lorayne Bender  Exercises - Supine Shoulder Press with Dowel  - 1 x daily - 7 x weekly - 3 sets - 10 reps - Seated Scapular Retraction  - 1 x daily - 7 x weekly - 3 sets - 10 reps - Seated Shoulder Flexion Towel Slide at Table Top  - 1 x daily - 7 x weekly - 3 sets - 10 reps - Supine Shoulder External Rotation with Dowel  - 1 x daily - 7 x weekly - 3 sets - 10 reps  ASSESSMENT:  CLINICAL IMPRESSION: Increased pain level with AROM today, so modified as needed to avoid increasing discomfort level. She remains limited into end ranges with PROM. Educated pt on avoiding lifting with L UE until further along in healing process. Educated about use of ice. Will continue to progress as tolerated.  OBJECTIVE IMPAIRMENTS: Abnormal gait, decreased activity tolerance, decreased ROM, decreased strength, impaired UE functional use, and pain.   ACTIVITY LIMITATIONS: carrying, lifting, dressing, self feeding, and reach over head  PARTICIPATION LIMITATIONS: meal prep, cleaning, driving, shopping, community activity, occupation, and yard work  PERSONAL FACTORS: 3+ comorbidities: multi joint replacement; left RTC repair; lumbar DDD  are also affecting patient's functional outcome.   REHAB POTENTIAL: Excellent  CLINICAL DECISION MAKING: Stable/uncomplicated  EVALUATION COMPLEXITY: Low   GOALS: Goals reviewed with patient? Yes  SHORT TERM GOALS: Target date: 01/18/2023    Patient will increase left shoulder passive flexion by 15 degrees Baseline: Goal status: INITIAL  2.  Patient will increase left shoulder passive external rotation by 10 degrees Baseline:  Goal status: INITIAL  3.  Patient will be independent with basic HEP Baseline:  Goal status: IN PROGRESS   LONG TERM GOALS: Target date: 02/15/2023    Patient will reach behind her head without increased pain in order to perform ADLs Baseline:  Goal status: INITIAL  2.  Patient will reach behind her back without pain in order to perform  ADLs Baseline:  Goal status: INITIAL  3.  Patient will reach overhead with 2 pound weight in order to perform daily tasks Baseline:  Goal status: INITIAL    PLAN:  PT FREQUENCY: 2x/week  PT DURATION: 8 weeks  PLANNED INTERVENTIONS: Therapeutic exercises, Therapeutic activity, Neuromuscular re-education, Balance training, Gait training, Patient/Family education, Self Care, Joint mobilization, Stair training, DME instructions, Aquatic Therapy, Cryotherapy, Moist heat, Taping, and Manual therapy  PLAN FOR NEXT SESSION: Per MD follow standard shoulder decompression protocol.  Progress patient as tolerated.  Progress current HEP as tolerated next visit.   Donnel Saxon Brystal Kildow, PTA 01/12/2023, 2:47 PM

## 2023-01-15 ENCOUNTER — Ambulatory Visit (HOSPITAL_COMMUNITY)
Admission: EM | Admit: 2023-01-15 | Discharge: 2023-01-15 | Disposition: A | Discharge status: Home / Self Care | Payer: 59 | Attending: Emergency Medicine | Admitting: Emergency Medicine

## 2023-01-15 ENCOUNTER — Ambulatory Visit (INDEPENDENT_AMBULATORY_CARE_PROVIDER_SITE_OTHER): Payer: 59

## 2023-01-15 ENCOUNTER — Encounter (HOSPITAL_COMMUNITY): Payer: Self-pay | Admitting: *Deleted

## 2023-01-15 ENCOUNTER — Other Ambulatory Visit: Payer: Self-pay

## 2023-01-15 DIAGNOSIS — M47816 Spondylosis without myelopathy or radiculopathy, lumbar region: Secondary | ICD-10-CM | POA: Diagnosis not present

## 2023-01-15 DIAGNOSIS — M79645 Pain in left finger(s): Secondary | ICD-10-CM | POA: Diagnosis not present

## 2023-01-15 DIAGNOSIS — M7989 Other specified soft tissue disorders: Secondary | ICD-10-CM | POA: Diagnosis not present

## 2023-01-15 NOTE — ED Triage Notes (Signed)
Pt fell 2 months and injured her knee but did not know she had also injured her Lt middle finger . Pt has more swelling and pain to Lt middle finger.

## 2023-01-15 NOTE — ED Provider Notes (Signed)
[facility]    CSN: [id number] Arrival date & time: 01/15/23  1812      History   Chief Complaint Chief Complaint  Patient presents with   Finger Injury    HPI Melissa [name] is a 72 y.o. female.  3 months ago she fell and hurt her knees, was seen here after. She thinks she may have hurt her left hand middle finger at the same time as that fall, although was not having any pain until 1-2 months ago. Reports 10/10 pain and increased swelling x 1 month.  No new injury or trauma/falls She cannot extend the finger without pain. History of trigger finger and knows this is different  Has been taking tylenol   Had left shoulder replacement on 4/9. Follows with ortho surg and PT for this  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   Plantar fasciitis of left foot 10/01/2013   PONV (postoperative nausea and vomiting)    Primary osteoarthritis of left knee 07/11/2021   Right-sided face pain 01/07/2019   Rotator cuff tear 02/02/2017   Seasonal allergies    Sleep apnea 2022   no cpap   Trigger middle finger of right hand 11/12/2018   Vitreous degeneration, unspecified eye 10/25/2012    Patient Active Problem List   Diagnosis Date Noted   Nontraumatic incomplete tear of left rotator cuff 12/12/2022   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   Chronic insomnia 10/12/2022   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   Decreased functional mobility  02/01/2022   Personal history of colonic polyps 01/26/2022   History of total knee replacement, bilateral 07/11/2021   Sleep apnea, intolerant of CPAP, declines 05/24/2021   Healthcare maintenance 01/07/2019   Chronic left shoulder pain 01/08/2018   History of bilateral hip replacements 07/19/2017   Acquired renal cyst of left kidney 01/01/2016   Spondylosis without myelopathy or radiculopathy, 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/2014   Obesity 11/13/2012   Glaucoma    Type 2 diabetes mellitus, controlled (HCC), without complications    Anxiety and depression    HTN (hypertension) 01/06/2012   GERD (gastroesophageal reflux disease) 01/06/2012   Hypercholesteremia 01/06/2012    Past Surgical History:  Procedure Laterality Date   ABDOMINAL HYSTERECTOMY  2007   fibroids, heavy bleeding   ARTERY BIOPSY Right 01/23/2019   Procedure: REMOVAL OF PART OF RIGHT TEMPORAL ARTERY FOR BIOPSY;  Surgeon: Karie Soda, MD;  Location: [facility];  Service: General;  Laterality: Right;   CATARACT EXTRACTION W/ INTRAOCULAR LENS IMPLANT Bilateral    COLONOSCOPY  04/2017   TA, diverticulosis  ([facility])   GLAUCOMA SURGERY     HAMMER TOE SURGERY Bilateral    Right hip replacement     SHOULDER ARTHROSCOPY Left 12/12/2022   Procedure: LEFT SHOULDER ARTHROSCOPY WITH BALLOON SPACER;  Surgeon: Huel Cote, MD;  Location:  [facility];  Service: Orthopedics;  Laterality: Left;   TONSILLECTOMY     TOTAL HIP ARTHROPLASTY Left 2021   TOTAL KNEE ARTHROPLASTY Right 2004   TKR (Dr. Renae Fickle with [facility])   TOTAL KNEE ARTHROPLASTY Left 07/11/2021   Procedure: LEFT TOTAL KNEE ARTHROPLASTY;  Surgeon: Tarry Kos, MD;  Location: [facility];  Service: Orthopedics;  Laterality: Left;   TOTAL KNEE REVISION Right 03/2014   Dr Lamar Sprinkles [facility]   TOTAL SHOULDER ARTHROPLASTY Right 2022    OB History     Gravida  0   Para      Term      Preterm      AB      Living  0      SAB      IAB      Ectopic      Multiple       Live Births               Home Medications    Prior to Admission medications   Medication Sig Start Date End Date Taking? Authorizing Provider  Accu-Chek FastClix Lancets MISC USE TO TEST BLOOD SUGAR UP TO FOUR TIMES DAILY 11/25/18   Dragnev, Alphonse Guild, NP  ACCU-CHEK GUIDE test strip USE AS DIRECTED FOUR TIMES DAILY 06/28/20   Corwin Levins, MD  ALPRAZolam Prudy Feeler) 0.25 MG tablet TAKE 1 TABLET(0.25 MG) BY MOUTH AT BEDTIME AS NEEDED FOR SLEEP 12/09/22   Miguel Aschoff, MD  aspirin EC 325 MG tablet Take 1 tablet (325 mg total) by mouth daily. 12/12/22   Huel Cote, MD  blood glucose meter kit and supplies KIT Dispense based on patient and insurance preference. Use up to four times daily as directed. DX Code: E11.9 02/11/18   Dragnev, Alphonse Guild, NP  Dulaglutide (TRULICITY) 1.5 MG/0.5ML SOPN Inject 1.5 mg into the skin once a week. 11/17/22   Miguel Aschoff, MD  escitalopram (LEXAPRO) 10 MG tablet TAKE 1 TABLET(10 MG) BY MOUTH DAILY 09/14/22   Miguel Aschoff, MD  esomeprazole (NEXIUM) 20 MG capsule Take 20 mg by mouth once a week.    [provider]  losartan (COZAAR) 50 MG tablet Take 1 tablet (50 mg total) by mouth daily. 11/01/22   Miguel Aschoff, MD  VYZULTA 0.024 % SOLN Place 1 drop into both eyes at bedtime. 05/16/21   [provider]    Family History Family History  Problem Relation Age of Onset   Cancer Maternal Grandmother 74       ovarian   Diabetes Maternal Grandmother    Hypertension Maternal Grandmother    Cancer Mother 16       bone, MM   Stroke Other        unsure who   CAD Neg Hx    Colon cancer Neg Hx    Esophageal cancer Neg Hx    Rectal cancer Neg Hx    Stomach cancer Neg Hx     Social History Social History   Tobacco Use   Smoking status: Never   Smokeless tobacco: Never  Vaping Use   Vaping Use: Never used  Substance Use Topics   Alcohol use: No   Drug use: No     Allergies   Codeine,  Metformin and related, Oxycodone, and Latex   Review of Systems Review of Systems As per HPI  Physical Exam Triage Vital Signs ED Triage Vitals  Enc Vitals Group  BP 01/15/23 1951 132/83     Pulse Rate 01/15/23 1951 68     Resp 01/15/23 1951 18     Temp 01/15/23 1951 98.3 F (36.8 C)     Temp src --      SpO2 01/15/23 1951 95 %     Weight --      Height --      Head Circumference --      Peak Flow --      Pain Score 01/15/23 1950 10     Pain Loc --      Pain Edu? --      Excl. in GC? --    No data found.  Updated Vital Signs BP 132/83   Pulse 68   Temp 98.3 F (36.8 C)   Resp 18   SpO2 95%    Physical Exam Vitals and nursing note reviewed.  Constitutional:      General: She is not in acute distress. HENT:     Mouth/Throat:     Pharynx: Oropharynx is clear.  Cardiovascular:     Rate and Rhythm: Normal rate and regular rhythm.     Pulses: Normal pulses.  Pulmonary:     Effort: Pulmonary effort is normal.  Musculoskeletal:     Left hand: Deformity and bony tenderness present. No swelling or lacerations. Decreased range of motion. Normal strength. Normal capillary refill. Normal pulse.     Comments: Left hand middle finger is flexed at PIP. Pain with extension. Distal sensation intact, cap refill < 2 seconds  Skin:    General: Skin is warm and dry.     Capillary Refill: Capillary refill takes less than 2 seconds.     Findings: No bruising or erythema.  Neurological:     Mental Status: She is alert and oriented to person, place, and time.     UC Treatments / Results  Labs (all labs ordered are listed, but only abnormal results are displayed) Labs Reviewed - No data to display  EKG  Radiology DG Finger Middle Left  Result Date: 01/15/2023 CLINICAL DATA:  Subacute (? Two months ago) left middle finger injury with swelling and pain. EXAM: LEFT MIDDLE FINGER 2+V COMPARISON:  None Available. FINDINGS: There is mild soft tissue swelling extending along  the long finger. No soft tissue gas is seen or radiopaque foreign body. The bone mineralization is borderline to slightly osteopenic. No displaced fracture is seen. Joint spaces are maintained. There is a sesamoid ossicle at the ventral aspect of the long finger MCP joint. On the lateral view, there is a well corticated 2 mm ossicle at the dorsal aspect of the long finger DIP joint which could be a sesamoid ossicle or a remote chip fracture or loose body. Also on the lateral view, there is a small sessile osteochondroma arising from the ventral surface of the proximal shaft of the long finger middle phalanx. No erosive changes are seen. Trace degenerative spurring is noted at the ulnar aspect of the long finger MCP joint and of the long finger DIP joint. IMPRESSION: 1. Mild soft tissue swelling of the long finger. No displaced fracture is seen. 2. A 2 mm ossicle at the dorsal aspect of the long finger DIP joint could be a sesamoid ossicle or a remote chip fracture or loose body. 3. Small sessile osteochondroma arising from the ventral surface of the proximal shaft of the long finger middle phalanx. Electronically Signed   By: Almira Bar M.D.   On: 01/15/2023  20:36    Procedures Procedures (including critical care time)  Medications Ordered in UC Medications - No data to display  Initial Impression / Assessment and Plan / UC Course  I have reviewed the triage vital signs and the nursing notes.  Pertinent labs & imaging results that were available during my care of the patient were reviewed by me and considered in my medical decision making (see chart for details).  Xray of left hand middle finger: Soft tissue swelling, no gas or foreign body Ossicle at dorsal DIP vs remote chip fracture (no acute fx) Small osteochondroma  Recommend follow with hand. Provided with clinic info. She also has ortho surg whom she sees for other ailments.  Tylenol, ice in the meantime. Return precautions discussed.  Patient agrees to plan   Final Clinical Impressions(s) / UC Diagnoses   Final diagnoses:  Pain of left middle finger     Discharge Instructions      No acute fracture on xray.  I recommend you follow up with a hand or orthopedic specialist for further evaluation since the possible injury happened 3 months ago.  You can use tylenol for pain in the meantime. Apply ice for 10-15 minutes, not directly on the skin.      ED Prescriptions   None    PDMP not reviewed this encounter.   Aniken Monestime, Ray Church 01/15/23 2108

## 2023-01-15 NOTE — Discharge Instructions (Addendum)
No acute fracture on xray.  I recommend you follow up with a hand or orthopedic specialist for further evaluation since the possible injury happened 3 months ago.  You can use tylenol for pain in the meantime. Apply ice for 10-15 minutes, not directly on the skin.

## 2023-01-16 ENCOUNTER — Ambulatory Visit (HOSPITAL_BASED_OUTPATIENT_CLINIC_OR_DEPARTMENT_OTHER): Payer: 59 | Admitting: Physical Therapy

## 2023-01-16 ENCOUNTER — Ambulatory Visit (INDEPENDENT_AMBULATORY_CARE_PROVIDER_SITE_OTHER): Payer: 59 | Admitting: Licensed Clinical Social Worker

## 2023-01-16 ENCOUNTER — Encounter (HOSPITAL_BASED_OUTPATIENT_CLINIC_OR_DEPARTMENT_OTHER): Payer: Self-pay | Admitting: Physical Therapy

## 2023-01-16 DIAGNOSIS — M25611 Stiffness of right shoulder, not elsewhere classified: Secondary | ICD-10-CM

## 2023-01-16 DIAGNOSIS — F32A Depression, unspecified: Secondary | ICD-10-CM

## 2023-01-16 DIAGNOSIS — M6281 Muscle weakness (generalized): Secondary | ICD-10-CM | POA: Diagnosis not present

## 2023-01-16 DIAGNOSIS — M25512 Pain in left shoulder: Secondary | ICD-10-CM

## 2023-01-16 DIAGNOSIS — F419 Anxiety disorder, unspecified: Secondary | ICD-10-CM

## 2023-01-16 NOTE — Therapy (Signed)
OUTPATIENT PHYSICAL THERAPY SHOULDER TREATMENT   Patient Name: Melissa [name] MRN: [id number] DOB:03/09/1951, 72 y.o., female Today's Date: 01/16/2023  END OF SESSION:  PT End of Session - 01/16/23 1205     Visit Number 4    Number of Visits 16    Date for PT Re-Evaluation 02/15/23    PT Start Time 1145    PT Stop Time 1225    PT Time Calculation (min) 40 min    Activity Tolerance Patient tolerated treatment well    Behavior During Therapy [facility] for tasks assessed/performed              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   Plantar fasciitis of left foot 10/01/2013   PONV (postoperative nausea and vomiting)    Primary osteoarthritis of left knee 07/11/2021   Right-sided face pain 01/07/2019   Rotator cuff tear 02/02/2017   Seasonal allergies    Sleep apnea 2022   no cpap   Trigger middle finger of right hand 11/12/2018   Vitreous degeneration, unspecified eye 10/25/2012   Past Surgical History:  Procedure Laterality Date   ABDOMINAL HYSTERECTOMY  2007   fibroids, heavy bleeding   ARTERY BIOPSY Right 01/23/2019   Procedure: REMOVAL OF PART OF RIGHT TEMPORAL ARTERY FOR BIOPSY;  Surgeon: Karie Soda, MD;  Location: [facility];  Service: General;  Laterality: Right;   CATARACT EXTRACTION W/ INTRAOCULAR LENS IMPLANT Bilateral    COLONOSCOPY  04/2017   TA, diverticulosis  ([facility])   GLAUCOMA SURGERY     HAMMER TOE SURGERY Bilateral    Right hip  replacement     SHOULDER ARTHROSCOPY Left 12/12/2022   Procedure: LEFT SHOULDER ARTHROSCOPY WITH BALLOON SPACER;  Surgeon: Huel Cote, MD;  Location: [facility];  Service: Orthopedics;  Laterality: Left;   TONSILLECTOMY     TOTAL HIP ARTHROPLASTY Left 2021   TOTAL KNEE ARTHROPLASTY Right 2004   TKR (Dr. 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   Decreased functional mobility 02/01/2022   Personal history of colonic polyps 01/26/2022   History of total knee replacement, bilateral 07/11/2021   Sleep apnea, intolerant of CPAP, declines 05/24/2021   Healthcare maintenance 01/07/2019   Chronic left shoulder pain 01/08/2018   History of bilateral hip replacements 07/19/2017   Acquired renal cyst of left kidney  01/01/2016   Spondylosis without myelopathy or radiculopathy, 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/2014   Obesity 11/13/2012   Glaucoma    Type 2 diabetes mellitus, controlled (HCC), without complications    Anxiety and depression    HTN (hypertension) 01/06/2012   GERD (gastroesophageal reflux disease) 01/06/2012   Hypercholesteremia 01/06/2012    PCP: Charissa Bash, MD  REFERRING PROVIDER: Dr Huel Cote   REFERRING DIAG: [phone] (ICD-10-CM) - Nontraumatic incomplete tear of left rotator cuff   THERAPY DIAG:  Acute pain of left shoulder  Stiffness of right shoulder, not elsewhere classified  Muscle weakness (generalized)  Rationale for Evaluation and Treatment:  Rehabilitation  ONSET DATE: 12/12/22  Days since surgery: 35   SUBJECTIVE:                                                                                                                                                                                      SUBJECTIVE STATEMENT:  Pt states the shoulder is back to normal now.  Pt states the L shoulder is sore with the rain.    Hand dominance: Right  PERTINENT HISTORY: Multijoint arthritis, lumbar DDD, glaucoma, remote history of planta fasciitis, right shoulder replacement, right hip replacement, left total hip, bilateral total knees.  PAIN:  Are you having pain? No: NPRS scale: 0/10 Pain location: Left shoulder Pain description: Aching Aggravating factors: Use of the shoulder Relieving factors: Rest  PRECAUTIONS: None  WEIGHT BEARING RESTRICTIONS:   FALLS:  Has patient fallen in last 6 months? Yes. Number of falls 3 Falls towards her right LIVING ENVIRONMENT:   OCCUPATION: Would like to go back to work part-time  PLOF: Independent  PATIENT GOALS:Gardening and walking for exercise    OBJECTIVE:   DIAGNOSTIC FINDINGS:  Nothing postop  PATIENT SURVEYS:  FOTO    COGNITION: Overall cognitive status: Within functional limits for tasks assessed     SENSATION: WFL  POSTURE: Forward head rounded shoulders  UPPER EXTREMITY ROM:   PROM Right eval Left eval  Shoulder flexion   80  Shoulder extension    Shoulder abduction    Shoulder adduction    Shoulder internal rotation    Shoulder external rotation  15  Elbow flexion    Elbow extension    Wrist flexion    Wrist extension    Wrist ulnar deviation    Wrist radial deviation    Wrist pronation    Wrist supination    (Blank rows = not tested)  PROM Right eval Left eval  Shoulder flexion   50  Shoulder extension    Shoulder abduction    Shoulder adduction    Shoulder internal rotation  Unable to  reach behind her back  Shoulder external  rotation  Unable to reach behind her head  Elbow flexion    Elbow extension    Wrist flexion    Wrist extension    Wrist ulnar deviation    Wrist radial deviation    Wrist pronation    Wrist supination    (Blank rows = not tested)  UPPER EXTREMITY MMT:  MMT Right eval Left eval  Shoulder flexion    Shoulder extension    Shoulder abduction    Shoulder adduction    Shoulder internal rotation    Shoulder external rotation    Middle trapezius    Lower trapezius    Elbow flexion    Elbow extension    Wrist flexion    Wrist extension    Wrist ulnar deviation    Wrist radial deviation    Wrist pronation    Wrist supination    Grip strength (lbs)    (Blank rows = not tested) not tested secondary to recent surgery  SHOULDER SPECIAL TESTS:   JOINT MOBILITY TESTING:    PALPATION:  Significant sensitivity to the anterior shoulder   TODAY'S TREATMENT:                                                                                                                                         DATE:    01/12/23 -PROM Inf L GHJ mob grade III  -Supine AAROM 2x10 -side lying ABD short lever 2x10, 10x long lever -YTB 3x10 row -Pulleys 2' flexion and ABD -wall ball CW and CCW 2x10  01/12/23 -PROM -Wand flexion supine 2x10 -active L shoulder flexion supine x10 -seated scap retraction x15- 5 sec hold -Seatde elbow flexion 2x10 -Pulleys 2' flexion -ball rolls at table x20 flexion  01/05/23 -PROM -GHJ mobilizations -Wand flexion supine -active L shoulder flexion supine 2x5 -Sidelying ER x10 -Sidelying abd x10    PATIENT EDUCATION: Education details: anatomy, exercise progression, DOMS expectations, muscle firing,  envelope of function, HEP, POC  Person educated: Patient Education method: Explanation, Demonstration, Tactile cues, Verbal cues, and Handouts Education comprehension: verbalized understanding, returned demonstration, verbal cues required, tactile cues  required, and needs further education  HOME EXERCISE PROGRAM: Access Code: [email or web address] URL: [email or web address] Date: 12/21/2022 Prepared by: Lorayne Bender  Exercises - Supine Shoulder Press with Dowel  - 1 x daily - 7 x weekly - 3 sets - 10 reps - Seated Scapular Retraction  - 1 x daily - 7 x weekly - 3 sets - 10 reps - Seated Shoulder Flexion Towel Slide at Table Top  - 1 x daily - 7 x weekly - 3 sets - 10 reps - Supine Shoulder External Rotation with Dowel  - 1 x daily - 7 x weekly - 3 sets - 10 reps  ASSESSMENT:  CLINICAL IMPRESSION: Pt with reduction in soreness and initial pain starting session. Pt does have  acute soreness to TTP so STM withheld but pt was able to improve ROM following joint mobilizations. Pt was able to progress AROM into multiple planes today without pain and able to perform light scap resistance today. HEP updated. Continue with A/AAROM as able. Pt would benefit from continued skilled therapy in order to reach goals and maximize functional L UE strength and ROM for full return to PLOF.    OBJECTIVE IMPAIRMENTS: Abnormal gait, decreased activity tolerance, decreased ROM, decreased strength, impaired UE functional use, and pain.   ACTIVITY LIMITATIONS: carrying, lifting, dressing, self feeding, and reach over head  PARTICIPATION LIMITATIONS: meal prep, cleaning, driving, shopping, community activity, occupation, and yard work  PERSONAL FACTORS: 3+ comorbidities: multi joint replacement; left RTC repair; lumbar DDD  are also affecting patient's functional outcome.   REHAB POTENTIAL: Excellent  CLINICAL DECISION MAKING: Stable/uncomplicated  EVALUATION COMPLEXITY: Low   GOALS: Goals reviewed with patient? Yes  SHORT TERM GOALS: Target date: 01/18/2023    Patient will increase left shoulder passive flexion by 15 degrees Baseline: Goal status: MET  2.  Patient will increase left shoulder passive external rotation by 10 degrees Baseline:   Goal status: MET  3.  Patient will be independent with basic HEP Baseline:  Goal status: IN PROGRESS   LONG TERM GOALS: Target date: 02/15/2023    Patient will reach behind her head without increased pain in order to perform ADLs Baseline:  Goal status: INITIAL  2.  Patient will reach behind her back without pain in order to perform ADLs Baseline:  Goal status: INITIAL  3.  Patient will reach overhead with 2 pound weight in order to perform daily tasks Baseline:  Goal status: INITIAL    PLAN:  PT FREQUENCY: 2x/week  PT DURATION: 8 weeks  PLANNED INTERVENTIONS: Therapeutic exercises, Therapeutic activity, Neuromuscular re-education, Balance training, Gait training, Patient/Family education, Self Care, Joint mobilization, Stair training, DME instructions, Aquatic Therapy, Cryotherapy, Moist heat, Taping, and Manual therapy  PLAN FOR NEXT SESSION: Per MD follow standard shoulder decompression protocol.  Progress patient as tolerated.  Progress current HEP as tolerated next visit.  Zebedee Iba PT, DPT 01/16/23 12:55 PM

## 2023-01-18 ENCOUNTER — Encounter (HOSPITAL_BASED_OUTPATIENT_CLINIC_OR_DEPARTMENT_OTHER): Payer: Self-pay | Admitting: Obstetrics & Gynecology

## 2023-01-18 ENCOUNTER — Other Ambulatory Visit (HOSPITAL_COMMUNITY)
Admission: RE | Admit: 2023-01-18 | Discharge: 2023-01-18 | Disposition: A | Discharge status: Home / Self Care | Payer: 59 | Source: Ambulatory Visit | Attending: Obstetrics & Gynecology | Admitting: Obstetrics & Gynecology

## 2023-01-18 ENCOUNTER — Ambulatory Visit (INDEPENDENT_AMBULATORY_CARE_PROVIDER_SITE_OTHER): Payer: 59 | Admitting: Obstetrics & Gynecology

## 2023-01-18 ENCOUNTER — Encounter (HOSPITAL_BASED_OUTPATIENT_CLINIC_OR_DEPARTMENT_OTHER): Payer: Medicare Other | Admitting: Obstetrics & Gynecology

## 2023-01-18 VITALS — BP 143/84 | HR 62 | Ht 65.0 in | Wt 196.6 lb

## 2023-01-18 DIAGNOSIS — Z90711 Acquired absence of uterus with remaining cervical stump: Secondary | ICD-10-CM

## 2023-01-18 DIAGNOSIS — N952 Postmenopausal atrophic vaginitis: Secondary | ICD-10-CM

## 2023-01-18 DIAGNOSIS — Z780 Asymptomatic menopausal state: Secondary | ICD-10-CM

## 2023-01-18 DIAGNOSIS — Z1151 Encounter for screening for human papillomavirus (HPV): Secondary | ICD-10-CM | POA: Diagnosis not present

## 2023-01-18 DIAGNOSIS — Z124 Encounter for screening for malignant neoplasm of cervix: Secondary | ICD-10-CM

## 2023-01-18 NOTE — Progress Notes (Signed)
72 y.o. G0P0 Single Black or [place] American female here for new patient appointment.  She is PMP.  Denies vaginal bleeding.  H/o hysterectomy in 2007.  Pt unsure exactly what type of hysterectomy.  Thinks ovaries were removed.  (Was able to review pathology in [facility] which showed morcellated uterus with tubes and ovaries removed.  NO cervix in pathology--possible supracervical hysterectomy.  No operative note in [facility].)  Pt is also unsure if cervix was removed.  Has not had a pap smear in 2013.  Was never on HRT.  No hx of abnormal pap smear per her hx as well.  Does have some facial hair.  It is frustrating for her.    H/o diabetes.  Last HbA1C was 5.5.        Sexually active: No.  H/O STD:  no  Health Maintenance: PCP:  Dr. Charissa Bash.  Last wellness appt was 10/2022.  Did blood work at that appt:  yes Vaccines are up to date:  pt reports she has done her pneumonia vaccination Colonoscopy:  05/16/2022, done with Dr. Russella Dar and no follow up recommended. MMG:  12/17/2021 Negative BMD:  08/25/2019, normal Last pap smear:  10/24/2011.   H/o abnormal pap smear:  no    reports that she has never smoked. She has never used smokeless tobacco. She reports that she does not drink alcohol and does not use drugs.  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   Plantar fasciitis of left foot 10/01/2013   PONV (postoperative nausea and vomiting)    Primary osteoarthritis of left knee  07/11/2021   Right-sided face pain 01/07/2019   Rotator cuff tear 02/02/2017   Seasonal allergies    Sleep apnea 2022   no cpap   Trigger middle finger of right hand 11/12/2018   Vitreous degeneration, unspecified eye 10/25/2012    Past Surgical History:  Procedure Laterality Date   ABDOMINAL HYSTERECTOMY  2007   fibroids, heavy bleeding   ARTERY BIOPSY Right 01/23/2019   Procedure: REMOVAL OF PART OF RIGHT TEMPORAL ARTERY FOR BIOPSY;  Surgeon: Karie Soda, MD;  Location: [facility];  Service: General;  Laterality: Right;   CATARACT EXTRACTION W/ INTRAOCULAR LENS IMPLANT Bilateral    COLONOSCOPY  04/2017   TA, diverticulosis  ([facility])   GLAUCOMA SURGERY     HAMMER TOE SURGERY Bilateral    Right hip replacement     SHOULDER ARTHROSCOPY Left 12/12/2022   Procedure: LEFT SHOULDER ARTHROSCOPY WITH BALLOON SPACER;  Surgeon: Huel Cote, MD;  Location:  SURGERY CENTER;  Service: Orthopedics;  Laterality: Left;   TONSILLECTOMY     TOTAL HIP ARTHROPLASTY Left 2021   TOTAL KNEE ARTHROPLASTY Right 2004   TKR (Dr. Renae Fickle with [facility])   TOTAL KNEE ARTHROPLASTY Left 07/11/2021   Procedure: LEFT TOTAL KNEE ARTHROPLASTY;  Surgeon: Tarry Kos,  MD;  Location: [facility];  Service: Orthopedics;  Laterality: Left;   TOTAL KNEE REVISION Right 03/2014   Dr Lamar Sprinkles [facility]   TOTAL SHOULDER ARTHROPLASTY Right 2022    Current Outpatient Medications  Medication Sig Dispense Refill   Accu-Chek FastClix Lancets MISC USE TO TEST BLOOD SUGAR UP TO FOUR TIMES DAILY 306 each 1   ACCU-CHEK GUIDE test strip USE AS DIRECTED FOUR TIMES DAILY 100 strip 11   ALPRAZolam (XANAX) 0.25 MG tablet TAKE 1 TABLET(0.25 MG) BY MOUTH AT BEDTIME AS NEEDED FOR SLEEP 30 tablet 0   aspirin EC 325 MG tablet Take 1 tablet (325 mg total) by mouth daily. 14 tablet 0   blood glucose meter kit and supplies KIT Dispense based on patient and insurance preference. Use up to four times daily as directed. DX Code: E11.9 1 each 0    Dulaglutide (TRULICITY) 1.5 MG/0.5ML SOPN Inject 1.5 mg into the skin once a week. 6 mL 3   escitalopram (LEXAPRO) 10 MG tablet TAKE 1 TABLET(10 MG) BY MOUTH DAILY 90 tablet 3   esomeprazole (NEXIUM) 20 MG capsule Take 20 mg by mouth once a week.     losartan (COZAAR) 50 MG tablet Take 1 tablet (50 mg total) by mouth daily. 30 tablet 5   VYZULTA 0.024 % SOLN Place 1 drop into both eyes at bedtime.     No current facility-administered medications for this visit.    Family History  Problem Relation Age of Onset   Cancer Maternal Grandmother 66       ovarian   Diabetes Maternal Grandmother    Hypertension Maternal Grandmother    Cancer Mother 30       bone, MM   Stroke Other        unsure who   CAD Neg Hx    Colon cancer Neg Hx    Esophageal cancer Neg Hx    Rectal cancer Neg Hx    Stomach cancer Neg Hx     Review of Systems  Constitutional: Negative.   Genitourinary:        Occasional pelvic pain, on left    Exam:   BP (!) 143/84 (BP Location: Right Arm, Patient Position: Sitting, Cuff Size: Large)   Pulse 62   Ht 5\' 5"  (1.651 m) Comment: Reported  Wt 196 lb 9.6 oz (89.2 kg)   BMI 32.72 kg/m   Height: 5\' 5"  (165.1 cm) (Reported)  General appearance: alert, cooperative and appears stated age Breasts: normal appearance, no masses or tenderness Abdomen: soft, non-tender; bowel sounds normal; no masses,  no organomegaly Lymph nodes: Cervical, supraclavicular, and axillary nodes normal.  No abnormal inguinal nodes palpated Neurologic: Grossly normal  Pelvic: External genitalia:  no lesions              Urethra:  normal appearing urethra with no masses, tenderness or lesions              Bartholins and Skenes: normal                 Vagina: normal appearing vagina with atrophic changes and no discharge, no lesions              Cervix: no lesions and easily seen              Pap taken: Yes.   Bimanual Exam:  Uterus:  uterus absent              Adnexa: no mass, fullness,  tenderness  Rectovaginal: Confirms               Anus:  normal sphincter tone, no lesions  Chaperone, Ina Homes, CMA, was present for exam.  Assessment/Plan: 1. Postmenopausal - not on HRT  2. S/P laparoscopic supracervical hysterectomy - reviewed exam findings with pt and discussed she does have a cervix and guidelines for pap smear screening discussed.  Given no pap in 10 years, feel reasonable to repeat today.  3. Cervical cancer screening - Cytology - PAP( [facility]).  HPV testing ordered with pap smear as well.  If negative and HR HPV negative, do feel ok to stop with screening pap smears per current guidelines.  4. Vaginal atrophy - discussed treatment options.  As pt is not really having issues with this except discomfort today on exam and occasional dryness sensation, OTC options discussed with pt-- Replens, vit E vaginal suppositories, coconut oil.

## 2023-01-19 ENCOUNTER — Encounter (HOSPITAL_BASED_OUTPATIENT_CLINIC_OR_DEPARTMENT_OTHER): Payer: 59 | Admitting: Orthopaedic Surgery

## 2023-01-19 ENCOUNTER — Ambulatory Visit (HOSPITAL_BASED_OUTPATIENT_CLINIC_OR_DEPARTMENT_OTHER): Payer: 59

## 2023-01-19 ENCOUNTER — Encounter (HOSPITAL_BASED_OUTPATIENT_CLINIC_OR_DEPARTMENT_OTHER): Payer: Self-pay

## 2023-01-19 ENCOUNTER — Encounter (HOSPITAL_BASED_OUTPATIENT_CLINIC_OR_DEPARTMENT_OTHER): Payer: Self-pay | Admitting: Obstetrics & Gynecology

## 2023-01-19 DIAGNOSIS — M25512 Pain in left shoulder: Secondary | ICD-10-CM

## 2023-01-19 DIAGNOSIS — M25611 Stiffness of right shoulder, not elsewhere classified: Secondary | ICD-10-CM

## 2023-01-19 DIAGNOSIS — M6281 Muscle weakness (generalized): Secondary | ICD-10-CM | POA: Diagnosis not present

## 2023-01-19 NOTE — Therapy (Signed)
OUTPATIENT PHYSICAL THERAPY SHOULDER TREATMENT   Patient Name: Melissa [name] MRN: [id number] DOB:1950-12-18, 72 y.o., female Today's Date: 01/19/2023  END OF SESSION:  PT End of Session - 01/19/23 1132     Visit Number 5    Number of Visits 16    Date for PT Re-Evaluation 02/15/23    PT Start Time 1135    PT Stop Time 1220    PT Time Calculation (min) 45 min    Activity Tolerance Patient tolerated treatment well    Behavior During Therapy [facility] for tasks assessed/performed              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   Plantar fasciitis of left foot 10/01/2013   PONV (postoperative nausea and vomiting)    Primary osteoarthritis of left knee 07/11/2021   Right-sided face pain 01/07/2019   Rotator cuff tear 02/02/2017   Seasonal allergies    Sleep apnea 2022   no cpap   Trigger middle finger of right hand 11/12/2018   Vitreous degeneration, unspecified eye 10/25/2012   Past Surgical History:  Procedure Laterality Date   ARTERY BIOPSY Right 01/23/2019   Procedure: REMOVAL OF PART OF RIGHT TEMPORAL ARTERY FOR BIOPSY;  Surgeon: Karie Soda, MD;  Location: [facility];  Service: General;  Laterality: Right;   CATARACT EXTRACTION W/ INTRAOCULAR LENS IMPLANT Bilateral    COLONOSCOPY  04/2017   TA, diverticulosis  ([facility])   GLAUCOMA SURGERY     HAMMER TOE SURGERY Bilateral    LAPAROSCOPIC SUPRACERVICAL HYSTERECTOMY  2007   with BSO, cervix remains.   Fibroids.   Right hip replacement     SHOULDER ARTHROSCOPY Left 12/12/2022   Procedure: LEFT SHOULDER ARTHROSCOPY WITH BALLOON SPACER;  Surgeon: Huel Cote, MD;  Location: [facility];  Service: Orthopedics;  Laterality: Left;   TONSILLECTOMY     TOTAL HIP ARTHROPLASTY Left 2021   TOTAL KNEE ARTHROPLASTY Right 2004   TKR (Dr. 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   Decreased functional mobility 02/01/2022   Personal history of colonic polyps 01/26/2022   History of total knee replacement, bilateral 07/11/2021   Sleep apnea, intolerant of CPAP, declines 05/24/2021   Healthcare maintenance 01/07/2019   Chronic left shoulder pain 01/08/2018   History of bilateral hip replacements 07/19/2017   Acquired renal  cyst of left kidney 01/01/2016   Spondylosis without myelopathy or radiculopathy, 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/2014   Obesity 11/13/2012   Glaucoma    Type 2 diabetes mellitus, controlled (HCC), without complications    Anxiety and depression    HTN (hypertension) 01/06/2012   GERD (gastroesophageal reflux disease) 01/06/2012   Hypercholesteremia 01/06/2012    PCP: Charissa Bash, MD  REFERRING PROVIDER: Dr Huel Cote   REFERRING DIAG: [phone] (ICD-10-CM) - Nontraumatic incomplete tear of left rotator cuff   THERAPY DIAG:  Acute pain of left shoulder  Stiffness of right shoulder, not elsewhere classified  Muscle weakness (generalized)  Rationale for Evaluation and  Treatment: Rehabilitation  ONSET DATE: 12/12/22  Days since surgery: 38   SUBJECTIVE:                                                                                                                                                                                      SUBJECTIVE STATEMENT:  Pt report sno pan at rest. Has continued tenderness over anterior arm.    Hand dominance: Right  PERTINENT HISTORY: Multijoint arthritis, lumbar DDD, glaucoma, remote history of planta fasciitis, right shoulder replacement, right hip replacement, left total hip, bilateral total knees.  PAIN:  Are you having pain? No: NPRS scale: 0/10 Pain location: Left shoulder Pain description: Aching Aggravating factors: Use of the shoulder Relieving factors: Rest  PRECAUTIONS: None  WEIGHT BEARING RESTRICTIONS:   FALLS:  Has patient fallen in last 6 months? Yes. Number of falls 3 Falls towards her right LIVING ENVIRONMENT:   OCCUPATION: Would like to go back to work part-time  PLOF: Independent  PATIENT GOALS:Gardening and walking for exercise    OBJECTIVE:   DIAGNOSTIC FINDINGS:  Nothing postop  PATIENT SURVEYS:  FOTO    COGNITION: Overall cognitive status: Within functional limits for tasks assessed     SENSATION: WFL  POSTURE: Forward head rounded shoulders  UPPER EXTREMITY ROM:   PROM Right eval Left eval  Shoulder flexion   80  Shoulder extension    Shoulder abduction    Shoulder adduction    Shoulder internal rotation    Shoulder external rotation  15  Elbow flexion    Elbow extension    Wrist flexion    Wrist extension    Wrist ulnar deviation    Wrist radial deviation    Wrist pronation    Wrist supination    (Blank rows = not tested)  PROM Right eval Left eval  Shoulder flexion   50  Shoulder extension    Shoulder abduction    Shoulder adduction    Shoulder internal rotation  Unable to reach behind her back  Shoulder external rotation  Unable to  reach behind her head  Elbow flexion    Elbow extension    Wrist flexion    Wrist extension    Wrist ulnar deviation    Wrist radial deviation    Wrist pronation    Wrist supination    (Blank rows = not tested)  UPPER EXTREMITY MMT:  MMT Right eval Left eval  Shoulder flexion    Shoulder extension    Shoulder abduction    Shoulder adduction    Shoulder internal rotation    Shoulder external rotation    Middle trapezius    Lower trapezius    Elbow flexion    Elbow extension    Wrist flexion    Wrist extension    Wrist ulnar deviation    Wrist radial deviation    Wrist pronation    Wrist supination    Grip strength (lbs)    (Blank rows = not tested) not tested secondary to recent surgery  SHOULDER SPECIAL TESTS:   JOINT MOBILITY TESTING:    PALPATION:  Significant sensitivity to the anterior shoulder   TODAY'S TREATMENT:                                                                                                                                         DATE:   01/19/23 -PROM -GHJ distraction  -Supine active flexion x10 -Supine AAROM 2x10 -side lying ABD short lever 2x10, 10x long lever -RTB row and extension x20 ea -Pulleys 2' flexion and ABD -wall ball CW and CCW 2x10 (some discomfort) -standing press out x10  01/12/23 -PROM Inf L GHJ mob grade III  -Supine AAROM with cane 2x10 -side lying ABD 2x10 -RTB row/extension- x20ea -Pulleys 2' ea flexion and ABD -wall ball CW and CCW 2x10  01/12/23 -PROM -Wand flexion supine 2x10 -active L shoulder flexion supine x10 -seated scap retraction x15- 5 sec hold -Seatde elbow flexion 2x10 -Pulleys 2' flexion -ball rolls at table x20 flexion  PATIENT EDUCATION: Education details: anatomy, exercise progression, DOMS expectations, muscle firing,  envelope of function, HEP, POC  Person educated: Patient Education method: Explanation, Demonstration, Tactile cues, Verbal cues, and Handouts Education  comprehension: verbalized understanding, returned demonstration, verbal cues required, tactile cues required, and needs further education  HOME EXERCISE PROGRAM: Access Code: [email or web address] URL: [email or web address] Date: 12/21/2022 Prepared by: Lorayne Bender  Exercises - Supine Shoulder Press with Dowel  - 1 x daily - 7 x weekly - 3 sets - 10 reps - Seated Scapular Retraction  - 1 x daily - 7 x weekly - 3 sets - 10 reps - Seated Shoulder Flexion Towel Slide at Table Top  - 1 x daily - 7 x weekly - 3 sets - 10 reps - Supine Shoulder External Rotation with Dowel  - 1 x daily - 7 x weekly - 3 sets - 10 reps  ASSESSMENT:  CLINICAL IMPRESSION: Pt remains challenged with supine active shoulder flexion in beginning of range, but denies pain with this. Improved ability for active abduction in sidelying. She had some discomfort with scaption pulleys, so readjusted plane of motion with improved tolerance. Pt progressing steadily.    OBJECTIVE IMPAIRMENTS: Abnormal gait, decreased activity tolerance, decreased ROM, decreased strength, impaired UE functional use, and pain.   ACTIVITY LIMITATIONS: carrying, lifting, dressing, self feeding, and reach over head  PARTICIPATION LIMITATIONS: meal prep, cleaning, driving, shopping, community activity, occupation, and yard work  PERSONAL FACTORS: 3+ comorbidities: multi joint replacement; left RTC repair; lumbar DDD  are also affecting patient's functional outcome.   REHAB POTENTIAL: Excellent  CLINICAL DECISION MAKING: Stable/uncomplicated  EVALUATION COMPLEXITY: Low   GOALS: Goals reviewed with patient? Yes  SHORT TERM GOALS: Target date: 01/18/2023    Patient will increase left shoulder passive flexion by 15 degrees Baseline: Goal status: MET  2.  Patient will increase left shoulder passive external rotation by 10 degrees Baseline:  Goal status: MET  3.  Patient will be independent with basic HEP Baseline:  Goal status: IN  PROGRESS   LONG TERM GOALS: Target date: 02/15/2023    Patient will reach behind her head without increased pain in order to perform ADLs Baseline:  Goal status: INITIAL  2.  Patient will reach behind her back without pain in order to perform ADLs Baseline:  Goal status: INITIAL  3.  Patient will reach overhead with 2 pound weight in order to perform daily tasks Baseline:  Goal status: INITIAL    PLAN:  PT FREQUENCY: 2x/week  PT DURATION: 8 weeks  PLANNED INTERVENTIONS: Therapeutic exercises, Therapeutic activity, Neuromuscular re-education, Balance training, Gait training, Patient/Family education, Self Care, Joint mobilization, Stair training, DME instructions, Aquatic Therapy, Cryotherapy, Moist heat, Taping, and Manual therapy  PLAN FOR NEXT SESSION: Per MD follow standard shoulder decompression protocol.  Progress patient as tolerated.  Progress current HEP as tolerated next visit.  Riki Altes, PTA  01/19/23 12:32 PM

## 2023-01-23 ENCOUNTER — Encounter (HOSPITAL_BASED_OUTPATIENT_CLINIC_OR_DEPARTMENT_OTHER): Payer: Self-pay

## 2023-01-23 ENCOUNTER — Other Ambulatory Visit: Payer: Self-pay

## 2023-01-23 ENCOUNTER — Ambulatory Visit: Payer: 59 | Admitting: Orthopaedic Surgery

## 2023-01-23 ENCOUNTER — Ambulatory Visit (HOSPITAL_BASED_OUTPATIENT_CLINIC_OR_DEPARTMENT_OTHER): Payer: 59

## 2023-01-23 DIAGNOSIS — F32A Depression, unspecified: Secondary | ICD-10-CM

## 2023-01-23 MED ORDER — ALPRAZOLAM 0.25 MG PO TABS: ORAL_TABLET | ORAL | 0 refills | Status: DC

## 2023-01-24 LAB — CYTOLOGY - PAP
Comment: NEGATIVE
Diagnosis: NEGATIVE
High risk HPV: NEGATIVE

## 2023-01-24 NOTE — Progress Notes (Signed)
Melissa [name] is here for routine f/u of chronic conditions.  She has been doing well.  Since last visit: Shoulder surgery (subacromial spacer placed), rehab ongoing; she is pleased with progress. Referral to spinal surgeon, injection planned for June Referral to oral surgeon placed Appointment made with gynecologist per her preference Saw her eye specialist about a month ago  She wishes to discuss: new face rash worse in the past couple of weeks, pruritic. No new topicals. No sun exposure.  Leaving dark scars.  OTC corticosteroid made it worse.   BP 128/81 (BP Location: Right Arm, Patient Position: Sitting, Cuff Size: Small)   Pulse 61   Temp 98.1 F (36.7 C) (Oral)   Ht 5\' 5"  (1.651 m)   Wt 200 lb 8 oz (90.9 kg)   SpO2 100%   BMI 33.36 kg/m  Dark thickened eczematous appearing skin  primarily on cheeks, a couple spots on forehead, chin and below chin.  Spares nose and nasolabial folds, spares the vermilion border.  L 3rd finger PIP swollen in flexed position and tender- ortho next week to evaluate  Assessment and plan:  HTN (hypertension) 128/81,  much better control on new regimen of losartan 50 mg daily (stopped lisinopril, cough nearly resolved; stopped HCTZ).   Type 2 diabetes mellitus, controlled (HCC), without complications A1c 5.8; acquired supply of Trulicity 1.5 weekly (after it was unavailable for a time). Doing well, no changes.  Chronic insomnia Difficulty initiating sleep.  Sleep habits discussed.  Agreeable to trial of Ramelteon. Has used alprazolam for sleep in the past, which we are trying to steer away from.    Rosacea, unspecified Dark thickened eczematous appearing skin  and red papules primarily on cheeks, a couple spots on forehead, chin and below chin.  Spares nose and nasolabial folds, spares the vermilion border. Pruritic. Trial metronidazole topical. Differential includes perioral dermatitis.

## 2023-01-25 ENCOUNTER — Encounter (HOSPITAL_BASED_OUTPATIENT_CLINIC_OR_DEPARTMENT_OTHER): Payer: Self-pay | Admitting: Physical Therapy

## 2023-01-25 ENCOUNTER — Ambulatory Visit (INDEPENDENT_AMBULATORY_CARE_PROVIDER_SITE_OTHER): Payer: 59 | Admitting: Internal Medicine

## 2023-01-25 ENCOUNTER — Ambulatory Visit (INDEPENDENT_AMBULATORY_CARE_PROVIDER_SITE_OTHER): Payer: 59

## 2023-01-25 ENCOUNTER — Ambulatory Visit (HOSPITAL_BASED_OUTPATIENT_CLINIC_OR_DEPARTMENT_OTHER): Payer: 59 | Admitting: Physical Therapy

## 2023-01-25 VITALS — BP 128/81 | HR 61 | Temp 98.1°F | Ht 65.0 in | Wt 200.5 lb

## 2023-01-25 DIAGNOSIS — L719 Rosacea, unspecified: Secondary | ICD-10-CM

## 2023-01-25 DIAGNOSIS — M25512 Pain in left shoulder: Secondary | ICD-10-CM

## 2023-01-25 DIAGNOSIS — E559 Vitamin D deficiency, unspecified: Secondary | ICD-10-CM | POA: Diagnosis not present

## 2023-01-25 DIAGNOSIS — L710 Perioral dermatitis: Secondary | ICD-10-CM

## 2023-01-25 DIAGNOSIS — F5102 Adjustment insomnia: Secondary | ICD-10-CM | POA: Diagnosis not present

## 2023-01-25 DIAGNOSIS — F5104 Psychophysiologic insomnia: Secondary | ICD-10-CM

## 2023-01-25 DIAGNOSIS — Z0000 Encounter for general adult medical examination without abnormal findings: Secondary | ICD-10-CM | POA: Diagnosis not present

## 2023-01-25 DIAGNOSIS — M25611 Stiffness of right shoulder, not elsewhere classified: Secondary | ICD-10-CM | POA: Diagnosis not present

## 2023-01-25 DIAGNOSIS — I10 Essential (primary) hypertension: Secondary | ICD-10-CM

## 2023-01-25 DIAGNOSIS — E119 Type 2 diabetes mellitus without complications: Secondary | ICD-10-CM

## 2023-01-25 DIAGNOSIS — M6281 Muscle weakness (generalized): Secondary | ICD-10-CM | POA: Diagnosis not present

## 2023-01-25 DIAGNOSIS — Z794 Long term (current) use of insulin: Secondary | ICD-10-CM | POA: Diagnosis not present

## 2023-01-25 LAB — POCT GLYCOSYLATED HEMOGLOBIN (HGB A1C): Hemoglobin A1C: 5.6 % (ref 4.0–5.6)

## 2023-01-25 LAB — GLUCOSE, CAPILLARY: Glucose-Capillary: 97 mg/dL (ref 70–99)

## 2023-01-25 MED ORDER — METRONIDAZOLE 1 % EX GEL
Freq: Every day | CUTANEOUS | 0 refills | Status: DC
Start: 2023-01-25 — End: 2023-06-22

## 2023-01-25 MED ORDER — RAMELTEON 8 MG PO TABS
8.0000 mg | ORAL_TABLET | Freq: Every evening | ORAL | 3 refills | Status: DC | PRN
Start: 2023-01-25 — End: 2023-11-29

## 2023-01-25 NOTE — Progress Notes (Signed)
I evaluated [name] independently today and agree with this AWV

## 2023-01-25 NOTE — BH Specialist Note (Signed)
Integrated Behavioral Health via Telemedicine Visit  01/25/2023 Melissa [name] [id number]  Number of Integrated Behavioral Health Clinician visits: Additional Visit  Session Start time: 1500 Session End time: 1600  Total time in minutes: 60   I connected with Melissa [name]   Telephone or Video Enabled Telemedicine Application  (Video is [employer], mining) and verified that I am speaking with the correct person using two identifiers. Discussed confidentiality: Yes    Types of Service: Individual psychotherapy and General Behavioral Integrated Care (BHI)   Interpretor:No. Interpretor Name and Language: N/A     Subjective: Melissa [name] is a 72 y.o. female  Patient was referred by PCP for Anxiety/Depression. Patient reports the following symptoms/concerns: Low mood, Rapid pace when speaking with others and inability to control interrupting others.  Duration of problem: 1 year; Severity of problem: severe   Objective: Mood: Anxious and Affect: Appropriate Risk of harm to self or others: No plan to harm self or others   Life Context:   Self-Care: Read, Walking and [employer], Faith in God    Patient and/or Family's Strengths/Protective Factors: Concrete supports in place (healthy food, safe environments, etc.) and Sense of purpose   Goals Addressed: Patient will: Reduce symptoms of: anxiety   Interventions: Interventions utilized: Mindfulness or Relaxation Training and Supportive Counseling  Standardized Assessments completed: PHQ-SADS     Assessment: Patient continues to use deep breathing and counting to calm anxiety. Patient enjoys providers and speaking with therapist. Patient advise she is doing well right now and will outreach [facility] if needed in the future.      I discussed the assessment and treatment plan with the patient and/or parent/guardian. They were provided an opportunity to ask questions and all were answered. They agreed with the  plan and demonstrated an understanding of the instructions.   They were advised to call back or seek an in-person evaluation if the symptoms worsen or if the condition fails to improve as anticipated.   Christen Butter, MSW, LCSW-A She/Her Behavioral Health Clinician [facility]  Internal Medicine Center Direct Dial:[phone]  Fax [phone] Main Office Phone: [phone] [address] [place] [place], [place] [address] Website: [facility] Internal [facility]  [place], [place]  [place]

## 2023-01-25 NOTE — Therapy (Signed)
OUTPATIENT PHYSICAL THERAPY SHOULDER TREATMENT   Patient Name: Melissa [name] MRN: [id number] DOB:May 30, 1951, 72 y.o., female Today's Date: 01/25/2023  END OF SESSION:  PT End of Session - 01/25/23 1213     Visit Number 6    Number of Visits 16    Date for PT Re-Evaluation 02/15/23    PT Start Time 1145    PT Stop Time 1226    PT Time Calculation (min) 41 min    Activity Tolerance Patient tolerated treatment well    Behavior During Therapy [facility] for tasks assessed/performed               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   Plantar fasciitis of left foot 10/01/2013   PONV (postoperative nausea and vomiting)    Primary osteoarthritis of left knee 07/11/2021   Right-sided face pain 01/07/2019   Rotator cuff tear 02/02/2017   Seasonal allergies    Sleep apnea 2022   no cpap   Trigger middle finger of right hand 11/12/2018   Vitreous degeneration, unspecified eye 10/25/2012   Past Surgical History:  Procedure Laterality Date   ARTERY BIOPSY Right 01/23/2019   Procedure: REMOVAL OF PART OF RIGHT TEMPORAL ARTERY FOR BIOPSY;  Surgeon: Karie Soda, MD;  Location: [facility] OR;  Service: General;  Laterality: Right;   CATARACT EXTRACTION W/ INTRAOCULAR LENS IMPLANT Bilateral    COLONOSCOPY  04/2017   TA, diverticulosis  ([facility])   GLAUCOMA SURGERY     HAMMER TOE SURGERY Bilateral    LAPAROSCOPIC SUPRACERVICAL HYSTERECTOMY  2007   with BSO, cervix remains.   Fibroids.   Right hip replacement     SHOULDER ARTHROSCOPY Left 12/12/2022   Procedure: LEFT SHOULDER ARTHROSCOPY WITH BALLOON SPACER;  Surgeon: Huel Cote, MD;  Location: [facility];  Service: Orthopedics;  Laterality: Left;   TONSILLECTOMY     TOTAL HIP ARTHROPLASTY Left 2021   TOTAL KNEE ARTHROPLASTY Right 2004   TKR (Dr. 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   Decreased functional mobility 02/01/2022   Personal history of colonic polyps 01/26/2022   History of total knee replacement, bilateral 07/11/2021   Sleep apnea, intolerant of CPAP, declines 05/24/2021   Healthcare maintenance 01/07/2019   Chronic left shoulder pain 01/08/2018   History of bilateral hip replacements 07/19/2017   Acquired  renal cyst of left kidney 01/01/2016   Spondylosis without myelopathy or radiculopathy, 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/2014   Obesity 11/13/2012   Glaucoma    Type 2 diabetes mellitus, controlled (HCC), without complications    Anxiety and depression    HTN (hypertension) 01/06/2012   GERD (gastroesophageal reflux disease) 01/06/2012   Hypercholesteremia 01/06/2012    PCP: Charissa Bash, MD  REFERRING PROVIDER: Dr Huel Cote   REFERRING DIAG: [phone] (ICD-10-CM) - Nontraumatic incomplete tear of left rotator cuff   THERAPY DIAG:  Acute pain of left shoulder  Rationale for Evaluation and Treatment: Rehabilitation  ONSET DATE: 12/12/22  Days since surgery: 44   SUBJECTIVE:                                                                                                                                                                                       SUBJECTIVE STATEMENT:  Pt states that the anterior shoulder is a little throbby right now but no pain. Mildly sore after last session.    Hand dominance: Right  PERTINENT HISTORY: Multijoint arthritis, lumbar DDD, glaucoma, remote history of planta fasciitis, right shoulder replacement, right hip replacement, left total hip, bilateral total knees.  PAIN:  Are you having pain? No: NPRS scale: 0/10 Pain location: Left shoulder Pain description: Aching Aggravating factors: Use of the shoulder Relieving factors: Rest  PRECAUTIONS: None  WEIGHT BEARING RESTRICTIONS:   FALLS:  Has patient fallen in last 6 months? Yes. Number of falls 3 Falls towards her right LIVING ENVIRONMENT:   OCCUPATION: Would like to go back to work part-time  PLOF: Independent  PATIENT GOALS:Gardening and walking for exercise    OBJECTIVE:   DIAGNOSTIC FINDINGS:  Nothing postop  PATIENT SURVEYS:  FOTO    COGNITION: Overall cognitive status: Within functional limits for tasks assessed     SENSATION: WFL  POSTURE: Forward head rounded shoulders  UPPER EXTREMITY ROM:   PROM Right eval Left eval  Shoulder flexion   80  Shoulder extension    Shoulder abduction    Shoulder adduction    Shoulder internal rotation    Shoulder external rotation  15  Elbow flexion    Elbow extension    Wrist flexion    Wrist extension    Wrist ulnar deviation    Wrist radial deviation    Wrist pronation    Wrist supination    (Blank rows = not tested)  PROM Right eval Left eval  Shoulder flexion   50  Shoulder extension    Shoulder abduction    Shoulder adduction    Shoulder internal rotation  Unable to reach behind her back  Shoulder external  rotation  Unable to reach behind her head  Elbow flexion    Elbow  extension    Wrist flexion    Wrist extension    Wrist ulnar deviation    Wrist radial deviation    Wrist pronation    Wrist supination    (Blank rows = not tested)  UPPER EXTREMITY MMT:  MMT Right eval Left eval  Shoulder flexion    Shoulder extension    Shoulder abduction    Shoulder adduction    Shoulder internal rotation    Shoulder external rotation    Middle trapezius    Lower trapezius    Elbow flexion    Elbow extension    Wrist flexion    Wrist extension    Wrist ulnar deviation    Wrist radial deviation    Wrist pronation    Wrist supination    Grip strength (lbs)    (Blank rows = not tested) not tested secondary to recent surgery  SHOULDER SPECIAL TESTS:   JOINT MOBILITY TESTING:    PALPATION:  Significant sensitivity to the anterior shoulder   TODAY'S TREATMENT:                                                                                                                                         DATE:   5/23  -PROM -L GHJ inf mob grade III  -Supine active flexion x10 -Supine ABC's 2x -RTB row and extension x20 ea -supine press L 2x10 -bicep curls 2lbs 2x10  01/19/23 -PROM -GHJ distraction  -Supine active flexion x10 -Supine AAROM 2x10 -side lying ABD short lever 2x10, 10x long lever -RTB row and extension x20 ea -Pulleys 2' flexion and ABD -wall ball CW and CCW 2x10 (some discomfort) -standing press out x10  01/12/23 -PROM Inf L GHJ mob grade III  -Supine AAROM with cane 2x10 -side lying ABD 2x10 -RTB row/extension- x20ea -Pulleys 2' ea flexion and ABD -wall ball CW and CCW 2x10  01/12/23 -PROM -Wand flexion supine 2x10 -active L shoulder flexion supine x10 -seated scap retraction x15- 5 sec hold -Seatde elbow flexion 2x10 -Pulleys 2' flexion -ball rolls at table x20 flexion  PATIENT EDUCATION: Education details: anatomy, exercise progression, DOMS expectations, muscle firing,  envelope of function, HEP, POC  Person  educated: Patient Education method: Explanation, Demonstration, Tactile cues, Verbal cues, and Handouts Education comprehension: verbalized understanding, returned demonstration, verbal cues required, tactile cues required, and needs further education  HOME EXERCISE PROGRAM: Access Code: [email or web address] URL: [email or web address] Date: 12/21/2022 Prepared by: Lorayne Bender  Exercises - Supine Shoulder Press with Dowel  - 1 x daily - 7 x weekly - 3 sets - 10 reps - Seated Scapular Retraction  - 1 x daily - 7 x weekly - 3 sets - 10 reps - Seated Shoulder Flexion Towel Slide at Table Top  - 1 x daily - 7 x weekly -  3 sets - 10 reps - Supine Shoulder External Rotation with Dowel  - 1 x daily - 7 x weekly - 3 sets - 10 reps  ASSESSMENT:  CLINICAL IMPRESSION:  Pt continues to present with anterior shoulder soreness that is consistent with deltoid and biceps irritation. Pt has most difficulty with eccentric lowering. However, pt's motor control in supine and scapular movements is improving from session to session. Pt does not respond well to direct STM due to hypersensitivity. Consider light isometric holds at 90 at future visits to desensitize arc of motion. Pt would benefit from continued skilled therapy in order to reach goals and maximize functional L UE strength and ROM for full return to PLOF.     OBJECTIVE IMPAIRMENTS: Abnormal gait, decreased activity tolerance, decreased ROM, decreased strength, impaired UE functional use, and pain.   ACTIVITY LIMITATIONS: carrying, lifting, dressing, self feeding, and reach over head  PARTICIPATION LIMITATIONS: meal prep, cleaning, driving, shopping, community activity, occupation, and yard work  PERSONAL FACTORS: 3+ comorbidities: multi joint replacement; left RTC repair; lumbar DDD  are also affecting patient's functional outcome.   REHAB POTENTIAL: Excellent  CLINICAL DECISION MAKING: Stable/uncomplicated  EVALUATION COMPLEXITY:  Low   GOALS: Goals reviewed with patient? Yes  SHORT TERM GOALS: Target date: 01/18/2023    Patient will increase left shoulder passive flexion by 15 degrees Baseline: Goal status: MET  2.  Patient will increase left shoulder passive external rotation by 10 degrees Baseline:  Goal status: MET  3.  Patient will be independent with basic HEP Baseline:  Goal status: IN PROGRESS   LONG TERM GOALS: Target date: 02/15/2023    Patient will reach behind her head without increased pain in order to perform ADLs Baseline:  Goal status: INITIAL  2.  Patient will reach behind her back without pain in order to perform ADLs Baseline:  Goal status: INITIAL  3.  Patient will reach overhead with 2 pound weight in order to perform daily tasks Baseline:  Goal status: INITIAL    PLAN:  PT FREQUENCY: 2x/week  PT DURATION: 8 weeks  PLANNED INTERVENTIONS: Therapeutic exercises, Therapeutic activity, Neuromuscular re-education, Balance training, Gait training, Patient/Family education, Self Care, Joint mobilization, Stair training, DME instructions, Aquatic Therapy, Cryotherapy, Moist heat, Taping, and Manual therapy  PLAN FOR NEXT SESSION: Per MD follow standard shoulder decompression protocol.  Progress patient as tolerated.  Progress current HEP as tolerated next visit.  Zebedee Iba PT, DPT 01/25/23 12:50 PM

## 2023-01-25 NOTE — Progress Notes (Addendum)
Subjective:   Melissa [name] is a 72 y.o. female who presents for Medicare Annual (Subsequent) preventive examination. I connected with  Elsie F Marcantel on 01/25/23 by a  Face-To-Face encounter   and verified that I am speaking with the correct person using two identifiers.  Patient Location: Other:  Office/Clinic  Provider Location: Office/Clinic  I discussed the limitations of evaluation and management by telemedicine. The patient expressed understanding and agreed to proceed.  Review of Systems    Defer to PCP       Objective:    Today's Vitals   01/25/23 1357  BP: 128/81  Pulse: 61  Temp: 98.1 F (36.7 C)  TempSrc: Oral  SpO2: 100%  Weight: 200 lb 8 oz (90.9 kg)  Height: 5\' 5"  (1.651 m)  PainSc: 0-No pain   Body mass index is 33.36 kg/m.     01/25/2023    1:58 PM 01/25/2023    8:56 AM 12/12/2022    7:13 AM 10/12/2022    8:43 AM 12/01/2021    9:59 AM 10/19/2021    9:38 AM 07/26/2021    1:45 PM  Advanced Directives  Does Patient Have a Medical Advance Directive? No No No No No No No  Would patient like information on creating a medical advance directive? No - Patient declined No - Patient declined No - Patient declined No - Patient declined No - Patient declined No - Patient declined No - Patient declined    Current Medications (verified) Outpatient Encounter Medications as of 01/25/2023  Medication Sig   Accu-Chek FastClix Lancets MISC USE TO TEST BLOOD SUGAR UP TO FOUR TIMES DAILY   ACCU-CHEK GUIDE test strip USE AS DIRECTED FOUR TIMES DAILY   ALPRAZolam (XANAX) 0.25 MG tablet TAKE 1 TABLET(0.25 MG) BY MOUTH AT BEDTIME AS NEEDED FOR SLEEP   aspirin EC 325 MG tablet Take 1 tablet (325 mg total) by mouth daily.   blood glucose meter kit and supplies KIT Dispense based on patient and insurance preference. Use up to four times daily as directed. DX Code: E11.9   Dulaglutide (TRULICITY) 1.5 MG/0.5ML SOPN Inject 1.5 mg into the skin once a week.   escitalopram  (LEXAPRO) 10 MG tablet TAKE 1 TABLET(10 MG) BY MOUTH DAILY   esomeprazole (NEXIUM) 20 MG capsule Take 20 mg by mouth once a week.   losartan (COZAAR) 50 MG tablet Take 1 tablet (50 mg total) by mouth daily.   metroNIDAZOLE (METROGEL) 1 % gel Apply topically daily. Apply to clean dry skin once daily   ramelteon (ROZEREM) 8 MG tablet Take 1 tablet (8 mg total) by mouth at bedtime as needed for sleep.   VYZULTA 0.024 % SOLN Place 1 drop into both eyes at bedtime.   No facility-administered encounter medications on file as of 01/25/2023.    Allergies (verified) Codeine, Metformin and related, Oxycodone, and Latex   History: Past Medical History:  Diagnosis Date   Allergy    Anxiety    Arthritis    s/p R TKR   Cyst of left kidney 2015   by lumbar MRI pending renal US   DDD (degenerative disc disease), lumbar 03/2014   severe L2-3 with L HNP with L2/3 nerve root impingement [name] @ [facility])   Depression with anxiety    Diabetes type 2, controlled (HCC) 2011   borderline   Glaucoma    History of ulcer disease    HLD (hyperlipidemia)    no meds taken   Hypertension  OAB (overactive bladder) 10/14/2019   Osteoarthritis of spine with radiculopathy, 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   Plantar fasciitis of left foot 10/01/2013   PONV (postoperative nausea and vomiting)    Primary osteoarthritis of left knee 07/11/2021   Right-sided face pain 01/07/2019   Rotator cuff tear 02/02/2017   Seasonal allergies    Sleep apnea 2022   no cpap   Trigger middle finger of right hand 11/12/2018   Vitreous degeneration, unspecified eye 10/25/2012   Past Surgical History:  Procedure Laterality Date   ARTERY BIOPSY Right 01/23/2019   Procedure: REMOVAL OF PART OF RIGHT TEMPORAL ARTERY FOR BIOPSY;  Surgeon: Karie Soda, MD;  Location: [facility] OR;  Service: General;  Laterality: Right;   CATARACT EXTRACTION W/ INTRAOCULAR LENS IMPLANT Bilateral    COLONOSCOPY  04/2017   TA, diverticulosis  ([facility])   GLAUCOMA  SURGERY     HAMMER TOE SURGERY Bilateral    LAPAROSCOPIC SUPRACERVICAL HYSTERECTOMY  2007   with BSO, cervix remains.  Fibroids.   Right hip replacement     SHOULDER ARTHROSCOPY Left 12/12/2022   Procedure: LEFT SHOULDER ARTHROSCOPY WITH BALLOON SPACER;  Surgeon: Huel Cote, MD;  Location: [facility];  Service: Orthopedics;  Laterality: Left;   TONSILLECTOMY     TOTAL HIP ARTHROPLASTY Left 2021   TOTAL KNEE ARTHROPLASTY Right 2004   TKR (Dr. Renae Fickle with [facility])   TOTAL KNEE ARTHROPLASTY Left 07/11/2021   Procedure: LEFT TOTAL KNEE ARTHROPLASTY;  Surgeon: Tarry Kos, MD;  Location: [facility] OR;  Service: Orthopedics;  Laterality: Left;   TOTAL KNEE REVISION Right 03/2014   Dr Lamar Sprinkles [facility]   TOTAL SHOULDER ARTHROPLASTY Right 2022   Family History  Problem Relation Age of Onset   Cancer Maternal Grandmother 15       ovarian   Diabetes Maternal Grandmother    Hypertension Maternal Grandmother    Cancer Mother 32       bone, MM   Stroke Other        unsure who   CAD Neg Hx    Colon cancer Neg Hx    Esophageal cancer Neg Hx    Rectal cancer Neg Hx    Stomach cancer Neg Hx    Social History   Socioeconomic History   Marital status: Single    Spouse name: Not on file   Number of children: 0   Years of education: 16   Highest education level: Not on file  Occupational History   Occupation: work with disabilities/ Retired    Associate Professor: Therapist, sports  Tobacco Use   Smoking status: Never   Smokeless tobacco: Never  Vaping Use   Vaping Use: Never used  Substance and Sexual Activity   Alcohol use: No   Drug use: No   Sexual activity: Not Currently    Birth control/protection: Surgical  Other Topics Concern   Not on file  Social History Narrative   Caffeine: rare   Lives alone   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[employer]   Fun/Hobbies: Gardening    Social Determinants of Health   Financial Resource Strain: Medium Risk (01/25/2023)    Overall Financial Resource Strain (CARDIA)    Difficulty of Paying Living Expenses: Somewhat hard  Food Insecurity: No Food Insecurity (01/25/2023)   Hunger Vital Sign    Worried About Running Out of Food in the Last Year: Never true    Ran Out of Food in the Last Year: Never true  Transportation Needs: No  Transportation Needs (01/25/2023)   PRAPARE - Administrator, Civil Service (Medical): No    Lack of Transportation (Non-Medical): No  Physical Activity: Insufficiently Active (01/25/2023)   Exercise Vital Sign    Days of Exercise per Week: 2 days    Minutes of Exercise per Session: 40 min  Stress: Stress Concern Present (01/25/2023)   [employer] of Occupational Health - Occupational Stress Questionnaire    Feeling of Stress : Rather much  Social Connections: Unknown (01/25/2023)   Social Connection and Isolation Panel [NHANES]    Frequency of Communication with Friends and Family: Once a week    Frequency of Social Gatherings with Friends and Family: Not on file    Attends Religious Services: Not on file    Active Member of Clubs or Organizations: Not on file    Attends Engineer, structural: More than 4 times per year    Marital Status: Never married    Tobacco Counseling Counseling given: Not Answered   Clinical Intake:  Pre-visit preparation completed: Yes  Pain : No/denies pain Pain Score: 0-No pain     Nutritional Risks: None Diabetes: Yes CBG done?: Yes CBG resulted in Enter/ Edit results?: Yes Did pt. bring in CBG monitor from home?: No  How often do you need to have someone help you when you read instructions, pamphlets, or other written materials from your doctor or pharmacy?: 1 - Never What is the last grade level you completed in school?: college  Diabetic?Nutrition Risk Assessment:  Has the patient had any N/V/D within the last 2 months?  No  Does the patient have any non-healing wounds?  No  Has the patient had any  unintentional weight loss or weight gain?  No   Diabetes:  Is the patient diabetic?  Yes  If diabetic, was a CBG obtained today?  Yes  Did the patient bring in their glucometer from home?  No   Financial Strains and Diabetes Management:  Are you having any financial strains with the device, your supplies or your medication? No .  Does the patient want to be seen by Chronic Care Management for management of their diabetes?  No  Would the patient like to be referred to a Nutritionist or for Diabetic Management?  No   Diabetic Exams:  Diabetic Eye Exam: Completed Dr.Lisa Sun [facility] Diabetic Foot Exam: Completed 10/12/2022    Interpreter Needed?: No  Information entered by :: Salik Grewell,cma   Activities of Daily Living    01/25/2023    1:58 PM 01/25/2023    8:55 AM  In your present state of health, do you have any difficulty performing the following activities:  Hearing? 0 0  Vision? 1 1  Difficulty concentrating or making decisions? 0 0  Walking or climbing stairs? 1 1  Dressing or bathing? 0 0  Doing errands, shopping? 0 0    Patient Care Team: Miguel Aschoff, MD as PCP - General (Internal Medicine) Rollene Rotunda, MD as PCP - Cardiology (Cardiology) Christia Reading, MD as Consulting Physician (Otolaryngology) Verlin Dike., MD as Consulting Physician (Sports Medicine)  Indicate any recent Medical Services you may have received from other than [facility] providers in the past year (date may be approximate).     Assessment:   This is a routine wellness examination for Trinka.  Hearing/Vision screen No results found.  Dietary issues and exercise activities discussed:     Goals Addressed   None   Depression  Screen    01/25/2023    1:58 PM 01/18/2023    1:25 PM 10/12/2022    8:58 AM 02/09/2022    2:34 PM 12/01/2021    9:52 AM 12/01/2021    9:49 AM 06/08/2021    4:23 PM  PHQ 2/9 Scores  PHQ - 2 Score 0 0 2 4 6 6 3   PHQ- 9 Score   9 13  18  11     Fall Risk    01/25/2023    1:58 PM 01/25/2023    8:54 AM 10/12/2022    8:42 AM 02/09/2022    1:24 PM 12/01/2021   10:00 AM  Fall Risk   Falls in the past year? 0 0 0 0 0  Number falls in past yr: 0 0 0  0  Injury with Fall? 0 0 0  0  Risk for fall due to : No Fall Risks   No Fall Risks No Fall Risks  Follow up Falls evaluation completed;Falls prevention discussed Falls evaluation completed Falls evaluation completed  Falls evaluation completed;Falls prevention discussed    FALL RISK PREVENTION PERTAINING TO THE HOME:  Any stairs in or around the home? No  If so, are there any without handrails? No  Home free of loose throw rugs in walkways, pet beds, electrical cords, etc? Yes  Adequate lighting in your home to reduce risk of falls? Yes   ASSISTIVE DEVICES UTILIZED TO PREVENT FALLS:  Life alert? No  Use of a cane, walker or w/c? Yes  cane at times Grab bars in the bathroom? Yes  Shower chair or bench in shower? No  Elevated toilet seat or a handicapped toilet? No   TIMED UP AND GO:  Was the test performed? No .  Length of time to ambulate 10 feet: 0 sec.   Gait slow and steady without use of assistive device  Cognitive Function:        12/01/2021   10:05 AM  6CIT Screen  What Year? 0 points  What month? 0 points  What time? 0 points  Count back from 20 0 points  Months in reverse 0 points  Repeat phrase 0 points  Total Score 0 points    Immunizations Immunization History  Administered Date(s) Administered   Fluad Quad(high Dose 65+) 05/08/2019, 05/11/2021, 05/12/2022   Influenza, High Dose Seasonal PF 05/30/2017, 05/15/2018, 05/08/2019, 06/13/2019   Influenza,inj,Quad PF,6+ Mos 05/13/2013   Influenza,inj,quad, With Preservative 05/29/2014   Influenza-Unspecified 05/13/2013, 06/04/2014, 06/26/2015, 05/15/2016   PFIZER Comirnaty(Gray Top)Covid-19 Tri-Sucrose Vaccine 06/19/2020, 02/09/2021   PFIZER(Purple Top)SARS-COV-2 Vaccination 09/26/2019,  10/17/2019, 06/19/2020   PNEUMOCOCCAL CONJUGATE-20 02/09/2022   Pneumococcal Conjugate-13 04/25/2016, 09/18/2017   Pneumococcal Polysaccharide-23 08/05/2014   Tdap 04/16/2011    TDAP status: Due, Education has been provided regarding the importance of this vaccine. Advised may receive this vaccine at local pharmacy or Health Dept. Aware to provide a copy of the vaccination record if obtained from local pharmacy or Health Dept. Verbalized acceptance and understanding.  Flu Vaccine status: Up to date  Pneumococcal vaccine status: Up to date  Covid-19 vaccine status: Completed vaccines  Qualifies for Shingles Vaccine? No   Zostavax completed No   Shingrix Completed?: No.    Education has been provided regarding the importance of this vaccine. Patient has been advised to call insurance company to determine out of pocket expense if they have not yet received this vaccine. Advised may also receive vaccine at local pharmacy or Health Dept. Verbalized acceptance and understanding.  Screening  Tests Health Maintenance  Topic Date Due   Zoster Vaccines- Shingrix (1 of 2) Never done   OPHTHALMOLOGY EXAM  04/21/2020   DTaP/Tdap/Td (2 - Td or Tdap) 04/15/2021   COVID-19 Vaccine (6 - 2023-24 season) 05/05/2022   MAMMOGRAM  12/21/2022   INFLUENZA VACCINE  04/05/2023   HEMOGLOBIN A1C  07/28/2023   Diabetic kidney evaluation - Urine ACR  10/13/2023   FOOT EXAM  10/13/2023   Diabetic kidney evaluation - eGFR measurement  12/11/2023   Medicare Annual Wellness (AWV)  01/25/2024   Colonoscopy  05/17/2027   Pneumonia Vaccine 78+ Years old  Completed   DEXA SCAN  Completed   Hepatitis C Screening  Completed   HPV VACCINES  Aged Out    Health Maintenance  Health Maintenance Due  Topic Date Due   Zoster Vaccines- Shingrix (1 of 2) Never done   OPHTHALMOLOGY EXAM  04/21/2020   DTaP/Tdap/Td (2 - Td or Tdap) 04/15/2021   COVID-19 Vaccine (6 - 2023-24 season) 05/05/2022   MAMMOGRAM  12/21/2022     Colorectal cancer screening: Type of screening: Colonoscopy. Completed 05/16/2022. Repeat every 5 years   Lung Cancer Screening: (Low Dose CT Chest recommended if Age 16-80 years, 30 pack-year currently smoking OR have quit w/in 15years.) does not qualify.   Lung Cancer Screening Referral: N/A  Additional Screening:  Hepatitis C Screening: does not qualify; Completed 02/09/2022  Vision Screening: Recommended annual ophthalmology exams for early detection of glaucoma and other disorders of the eye. Is the patient up to date with their annual eye exam?  Yes  Who is the provider or what is the name of the office in which the patient attends annual eye exams? [facility] If pt is not established with a provider, would they like to be referred to a provider to establish care? No .   Dental Screening: Recommended annual dental exams for proper oral hygiene  Community Resource Referral / Chronic Care Management: CRR required this visit?  No   CCM required this visit?  No      Plan:     I have personally reviewed and noted the following in the patient's chart:   Medical and social history Use of alcohol, tobacco or illicit drugs  Current medications and supplements including opioid prescriptions. Patient is not currently taking opioid prescriptions. Functional ability and status Nutritional status Physical activity Advanced directives List of other physicians Hospitalizations, surgeries, and ER visits in previous 12 months Vitals Screenings to include cognitive, depression, and falls Referrals and appointments  In addition, I have reviewed and discussed with patient certain preventive protocols, quality metrics, and best practice recommendations. A written personalized care plan for preventive services as well as general preventive health recommendations were provided to patient.     Cala Bradford, [facility]   01/25/2023   Nurse Notes: Face-To-Face  Visit  Ms. Delford Field , Thank you for taking time to come for your Medicare Wellness Visit. I appreciate your ongoing commitment to your health goals. Please review the following plan we discussed and let me know if I can assist you in the future.   These are the goals we discussed:  Goals   None     This is a list of the screening recommended for you and due dates:  Health Maintenance  Topic Date Due   Zoster (Shingles) Vaccine (1 of 2) Never done   Eye exam for diabetics  04/21/2020   DTaP/Tdap/Td vaccine (2 - Td or  Tdap) 04/15/2021   COVID-19 Vaccine (6 - 2023-24 season) 05/05/2022   Mammogram  12/21/2022   Flu Shot  04/05/2023   Hemoglobin A1C  07/28/2023   Yearly kidney health urinalysis for diabetes  10/13/2023   Complete foot exam   10/13/2023   Yearly kidney function blood test for diabetes  12/11/2023   Medicare Annual Wellness Visit  01/25/2024   Colon Cancer Screening  05/17/2027   Pneumonia Vaccine  Completed   DEXA scan (bone density measurement)  Completed   Hepatitis C Screening  Completed   HPV Vaccine  Aged Out

## 2023-01-25 NOTE — Patient Instructions (Addendum)
Melissa [name], it was good to see you today and catch up on some important things.  For your facial rash, it looks like a type of dermatitis (inflammation of skin) which can affect the area around our mouth.  I have prescribed a gel to apply each day.    For sleep, I'd like you to try Ramelteon. Take it once at bedtime.  Our goal will be to gradually replace the xanax with something safer in the long run.  We'll check your vitamin D level today.  Refills were done.     Take care and stay well,  Dr. Mayford Knife

## 2023-01-26 LAB — VITAMIN D 25 HYDROXY (VIT D DEFICIENCY, FRACTURES): Vit D, 25-Hydroxy: 18.6 ng/mL — ABNORMAL LOW (ref 30.0–100.0)

## 2023-01-29 NOTE — Progress Notes (Unsigned)
Office Visit Note   Patient: Melissa [name]           Date of Birth: [birth year]           MRN: [id number] Visit Date: 01/30/2023              Requested by: Miguel Aschoff, MD [address] [place],  [place] [address] PCP: Miguel Aschoff, MD   Assessment & Plan: Visit Diagnoses: No diagnosis found.  Plan: ***  Follow-Up Instructions: No follow-ups on file.   Orders:  No orders of the defined types were placed in this encounter.  No orders of the defined types were placed in this encounter.     Procedures: No procedures performed   Clinical Data: No additional findings.   Subjective: No chief complaint on file.   HPI  Review of Systems  Constitutional: Negative.   HENT: Negative.    Eyes: Negative.   Respiratory: Negative.    Cardiovascular: Negative.   Endocrine: Negative.   Musculoskeletal: Negative.   Neurological: Negative.   Hematological: Negative.   Psychiatric/Behavioral: Negative.    All other systems reviewed and are negative.   Objective: Vital Signs: There were no vitals taken for this visit.  Physical Exam Vitals and nursing note reviewed.  Constitutional:      Appearance: She is well-developed.  HENT:     Head: Atraumatic.     Nose: Nose normal.  Eyes:     Extraocular Movements: Extraocular movements intact.  Cardiovascular:     Pulses: Normal pulses.  Pulmonary:     Effort: Pulmonary effort is normal.  Abdominal:     Palpations: Abdomen is soft.  Musculoskeletal:     Cervical back: Neck supple.  Skin:    General: Skin is warm.     Capillary Refill: Capillary refill takes less than 2 seconds.  Neurological:     Mental Status: She is alert. Mental status is at baseline.  Psychiatric:        Behavior: Behavior normal.        Thought Content: Thought content normal.        Judgment: Judgment normal.   Ortho Exam  Specialty Comments:  No specialty comments available.  Imaging: No results  found.   PMFS History: Patient Active Problem List   Diagnosis Date Noted  . Nontraumatic incomplete tear of left rotator cuff 12/12/2022  . 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 Chronic insomnia 10/12/2022  . 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 Decreased functional mobility 02/01/2022  . Personal history of colonic polyps 01/26/2022  . History of total knee replacement, bilateral 07/11/2021  . Sleep apnea, intolerant of CPAP, declines 05/24/2021  . Healthcare maintenance 01/07/2019  . Chronic left shoulder pain 01/08/2018  . History of bilateral hip replacements 07/19/2017  . Acquired renal cyst of left kidney 01/01/2016  . Spondylosis without myelopathy or radiculopathy, 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/2014  . Obesity 11/13/2012  . Glaucoma   . Type 2 diabetes mellitus, controlled (HCC), without complications   . Anxiety and depression   . HTN (hypertension) 01/06/2012  . GERD (gastroesophageal reflux disease) 01/06/2012  . Hypercholesteremia 01/06/2012   Past Medical History:  Diagnosis Date  . Allergy   . Anxiety   . Arthritis    s/p R TKR  . Cyst of left kidney 2015   by lumbar MRI pending renal US  . DDD (degenerative disc disease), lumbar 03/2014   severe L2-3 with L HNP with L2/3  nerve root impingement [name] @ [facility])  . Depression with anxiety   . Diabetes type 2, controlled (HCC) 2011   borderline  . Glaucoma   . History of ulcer disease   . HLD (hyperlipidemia)    no meds taken  . Hypertension   . OAB (overactive bladder) 10/14/2019  . Osteoarthritis of spine with radiculopathy, 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  . Plantar fasciitis of left foot 10/01/2013  . PONV (postoperative nausea and vomiting)   . Primary osteoarthritis of left knee 07/11/2021  . Right-sided face pain 01/07/2019  . Rotator cuff tear 02/02/2017  . Seasonal allergies   . Sleep apnea 2022   no cpap  . Trigger middle finger of right hand 11/12/2018  . Vitreous degeneration,  unspecified eye 10/25/2012    Family History  Problem Relation Age of Onset  . Cancer Maternal Grandmother 72       ovarian  . Diabetes Maternal Grandmother   . Hypertension Maternal Grandmother   . Cancer Mother 31       bone, MM  . Stroke Other        unsure who  . CAD Neg Hx   . Colon cancer Neg Hx   . Esophageal cancer Neg Hx   . Rectal cancer Neg Hx   . Stomach cancer Neg Hx     Past Surgical History:  Procedure Laterality Date  . ARTERY BIOPSY Right 01/23/2019   Procedure: REMOVAL OF PART OF RIGHT TEMPORAL ARTERY FOR BIOPSY;  Surgeon: Karie Soda, MD;  Location: [facility] OR;  Service: General;  Laterality: Right;  . CATARACT EXTRACTION W/ INTRAOCULAR LENS IMPLANT Bilateral   . COLONOSCOPY  04/2017   TA, diverticulosis  ([facility])  . GLAUCOMA SURGERY    . HAMMER TOE SURGERY Bilateral   . LAPAROSCOPIC SUPRACERVICAL HYSTERECTOMY  2007   with BSO, cervix remains.  Fibroids.  . Right hip replacement    . SHOULDER ARTHROSCOPY Left 12/12/2022   Procedure: LEFT SHOULDER ARTHROSCOPY WITH BALLOON SPACER;  Surgeon: Huel Cote, MD;  Location: [facility];  Service: Orthopedics;  Laterality: Left;  . TONSILLECTOMY    . TOTAL HIP ARTHROPLASTY Left 2021  . TOTAL KNEE ARTHROPLASTY Right 2004   TKR (Dr. Renae Fickle with [facility])  . TOTAL KNEE ARTHROPLASTY Left 07/11/2021   Procedure: LEFT TOTAL KNEE ARTHROPLASTY;  Surgeon: Tarry Kos, MD;  Location: [facility] OR;  Service: Orthopedics;  Laterality: Left;  . TOTAL KNEE REVISION Right 03/2014   Dr Lamar Sprinkles [facility]  . TOTAL SHOULDER ARTHROPLASTY Right 2022   Social History   Occupational History  . Occupation: work with disabilities/ Retired    Associate Professor: [employer]  Tobacco Use  . Smoking status: Never  . Smokeless tobacco: Never  Vaping Use  . Vaping Use: Never used  Substance and Sexual Activity  . Alcohol use: No  . Drug use: No  . Sexual activity: Not Currently    Birth control/protection: Surgical

## 2023-01-30 ENCOUNTER — Encounter: Payer: Self-pay | Admitting: Orthopaedic Surgery

## 2023-01-30 ENCOUNTER — Ambulatory Visit (INDEPENDENT_AMBULATORY_CARE_PROVIDER_SITE_OTHER): Payer: 59 | Admitting: Orthopaedic Surgery

## 2023-01-30 DIAGNOSIS — M79645 Pain in left finger(s): Secondary | ICD-10-CM | POA: Diagnosis not present

## 2023-01-31 ENCOUNTER — Ambulatory Visit
Admission: RE | Admit: 2023-01-31 | Discharge: 2023-01-31 | Disposition: A | Discharge status: Home / Self Care | Payer: 59 | Source: Ambulatory Visit | Attending: Internal Medicine | Admitting: Internal Medicine

## 2023-01-31 DIAGNOSIS — Z1231 Encounter for screening mammogram for malignant neoplasm of breast: Secondary | ICD-10-CM | POA: Diagnosis not present

## 2023-01-31 NOTE — Therapy (Signed)
OUTPATIENT OCCUPATIONAL THERAPY ORTHO EVALUATION  Patient Name: Melissa [name] MRN: [id number] DOB:[birth year], 72 y.o., female Today's Date: 02/01/2023  PCP: Miguel Aschoff, MD REFERRING PROVIDER:  Tarry Kos, MD    END OF SESSION:  OT End of Session - 02/01/23 1437     Visit Number 1    Number of Visits 10    Date for OT Re-Evaluation 03/16/23    Authorization Type [employer]    OT Start Time 1438    OT Stop Time 1522    OT Time Calculation (min) 44 min    Equipment Utilized During Treatment orthotic materials    Activity Tolerance Patient tolerated treatment well;Patient limited by pain;Patient limited by fatigue;No increased pain    Behavior During Therapy [facility] for tasks assessed/performed;Anxious             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   Plantar fasciitis of left foot 10/01/2013   PONV (postoperative nausea and vomiting)    Primary osteoarthritis of left knee 07/11/2021   Right-sided face pain 01/07/2019   Rotator cuff tear 02/02/2017   Seasonal allergies    Sleep apnea 2022   no cpap   Trigger middle finger of right hand 11/12/2018   Vitreous degeneration, unspecified eye 10/25/2012   Past Surgical History:  Procedure Laterality Date   ARTERY BIOPSY Right 01/23/2019   Procedure: REMOVAL OF PART OF RIGHT TEMPORAL ARTERY FOR BIOPSY;  Surgeon: Karie Soda, MD;  Location: [facility] OR;  Service: General;  Laterality: Right;   CATARACT  EXTRACTION W/ INTRAOCULAR LENS IMPLANT Bilateral    COLONOSCOPY  04/2017   TA, diverticulosis  ([facility])   GLAUCOMA SURGERY     HAMMER TOE SURGERY Bilateral    LAPAROSCOPIC SUPRACERVICAL HYSTERECTOMY  2007   with BSO, cervix remains.  Fibroids.   Right hip replacement     SHOULDER ARTHROSCOPY Left 12/12/2022   Procedure: LEFT SHOULDER ARTHROSCOPY WITH BALLOON SPACER;  Surgeon: Huel Cote, MD;  Location: [facility];  Service: Orthopedics;  Laterality: Left;   TONSILLECTOMY     TOTAL HIP ARTHROPLASTY Left 2021   TOTAL KNEE ARTHROPLASTY Right 2004   TKR (Dr. 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   Decreased functional mobility 02/01/2022   Personal history of colonic polyps 01/26/2022   History of total knee  replacement, bilateral 07/11/2021   Sleep apnea, intolerant of CPAP, declines 05/24/2021   Healthcare maintenance 01/07/2019   Chronic left shoulder pain 01/08/2018   History of bilateral hip replacements 07/19/2017   Acquired renal cyst of left kidney 01/01/2016   Spondylosis without myelopathy or radiculopathy, 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/2014   Obesity 11/13/2012   Glaucoma    Type 2 diabetes mellitus, controlled (HCC), without complications    Anxiety and depression    HTN (hypertension) 01/06/2012   GERD (gastroesophageal reflux disease) 01/06/2012   Hypercholesteremia 01/06/2012    ONSET DATE: ~4 months ago (fall)   REFERRING DIAG: M79.645 (ICD-10-CM) - Pain of left  middle finger   THERAPY DIAG:  Pain in left hand  Stiffness of left hand, not elsewhere classified  Muscle weakness (generalized)  Rationale for Evaluation and Treatment: Rehabilitation  SUBJECTIVE:   SUBJECTIVE STATEMENT: She states that she fell about 2 weeks ago-but in fact she fell about 4 months ago, per medical chart review.  When she fell, she hurt her knee and shoulder but also her left hand.  She had not had any treatment for the left hand yet, but has been seeing physical therapy for her knee and shoulder issues.  Now months later, she has developed a bit of a PIP joint flexion contracture, she is also stiff and very guarded from pain in the left hand.  She presents fairly anxious today about moving her left hand.  She states this problem causes her to have severe difficulties doing tasks around her home.    PERTINENT HISTORY: Per MD: "Eval and treat left middle finger PIP contracture. S/p sprain and injury.  "   PRECAUTIONS: Lt  Shoulder;  WEIGHT BEARING RESTRICTIONS:  yes, caution with Lt shoulder ,though hand therpay should not interfere with this much  PAIN:  Are you having pain? Not at rest, but yes with moving and at night 6/10 throbbing pain in Lt MF MCP J volarly up through P1  FALLS: Has patient fallen in last 6 months? Yes. Number of falls 1 (this accident)   LIVING ENVIRONMENT: Lives with: lives alone  PLOF: Independent  PATIENT GOALS: To have better motion and ability in left nondominant hand.   OBJECTIVE: (All objective assessments below are from initial evaluation on: 02/01/23 unless otherwise specified.)   HAND DOMINANCE: Right   ADLs: Overall ADLs: States decreased ability to grab, hold household objects, pain and inability to open containers, perform FMS tasks (manipulate fasteners on clothing), mild to moderate bathing problems as well.    FUNCTIONAL OUTCOME MEASURES: Eval: Quck DASH 66% impairment today  (Higher % Score  =  More Impairment)     UPPER EXTREMITY ROM     Shoulder to Wrist AROM Left eval  Wrist flexion 72  Wrist extension 55  (Blank rows = not tested)   Hand AROM Left eval  Full Fist Ability (or Gap to Distal Palmar Crease) 5cm gap form MF to [facility]  Thumb Opposition  (Kapandji Scale)  Unable to oppose to MF but able to all others  Long MCP (0-90) 0-  37  Long PIP (0-100) (-32) -  78  Long DIP (0-70) 0-  32  (Blank rows = not tested)   UPPER EXTREMITY MMT:    Eval:  NT at eval due to recent and still healing injuries. Will be tested when/as appropriate.   MMT Left TBD  Wrist flexion   Wrist extension   Wrist ulnar deviation   Wrist radial deviation   (  Blank rows = not tested)  HAND FUNCTION: Eval: Observed weakness in affected hand.  Grip strength Right: 43 lbs, Left: 13 lbs painful  COORDINATION: Eval: Observed coordination impairments with affected Lt hand. 9 Hole Peg Test  Left: TBD sec   SENSATION: Eval:  Light touch intact today  EDEMA:   Eval:  Mildly swollen in Lt hand volarly in MF through MCP J to PIP J   COGNITION: Eval: Overall cognitive status: WFL for evaluation today   OBSERVATIONS:   Eval: no instability noted at Lt MF MCP J or PIP or DIPJs, tender to palpation at volar MCP J, along P1 and into PIP J.  Suspicion that she hyperextended her MCP J and possibly PIP J, spraining ligament or straining tendon.  She seems to have a wicked habit of guarding from pain and holding her hand out stiffly, and this is likely exacerbated her issue.  She has resulting PIP joint flexion contracture and somewhat unwillingness to try to bend the MCP joint.   TODAY'S TREATMENT:  Post-evaluation treatment:  Custom orthotic fabrication was indicated due to pt's healing and painful left hand sprained 3rd digit MCP J and need for safe, functional positioning. OT fabricated custom SAFE position resting hand (including digits 2,3 only, and leaving DIP Js free) orthotic for pt today to help rest hand  and correct PIP J flexion contracture. It fit well with no areas of pressure, pt states a comfortable fit. Pt was educated on the wearing schedule, to call or come in ASAP if it is causing any irritation or is not achieving desired function. It will be checked/adjusted in upcoming sessions, as needed. Pt states understanding.   OT to case on self-care, to avoid guarding from pain postures in the left hand if possible.  OT also advises resting hand orthosis now to help with pain and stiffness as well as soreness, but to remove orthosis about 4 times a day to perform home exercises of composite fist and composite finger extension carefully and arm painfully 15 times for 1 or 2 sets per session.  She can also remove orthosis for any safety reasons (like if it is hurting her for any reason) or for driving as needed or for hygiene as needed.  She states understanding    PATIENT EDUCATION: Education details: See tx section above for details  Person educated: Patient Education method: Verbal Instruction, Teach back, Handouts  Education comprehension: States and demonstrates understanding, Additional Education required    HOME EXERCISE PROGRAM: See tx section above for details    GOALS: Goals reviewed with patient? Yes   SHORT TERM GOALS: (STG required if POC>30 days) Target Date: 02/16/23  Pt will obtain protective, custom orthotic. Goal status:  MET (may need adjustments)   2.  Pt will demo/state understanding of initial HEP to improve pain levels and prerequisite motion. Goal status: INITIAL   LONG TERM GOALS: Target Date: 03/16/23  Pt will improve functional ability by decreased impairment per Quick DASH assessment from 66% to 25% or better, for better quality of life. Goal status: INITIAL  2.  Pt will improve grip strength in Lt hand from painful 13lbs to at least non-painful 25lbs for functional use at home and in IADLs. Goal status: INITIAL  3.  Pt will improve A/ROM TAM in LT MF  from 115* to at least 210*, to have functional motion for tasks like reach and grasp.  Goal status: INITIAL  4.  Pt will improve coordination skills in Lt hand,  as seen by [facility] score on 9 Hole Peg testing to have increased functional ability to carry out fine motor tasks (fasteners, etc.) and more complex, coordinated IADLs (meal prep, sports, etc.).  Goal status: INITIAL  5.  Pt will decrease pain at worst from 6/10 to 2/10 or better to have better sleep and occupational participation in daily roles. Goal status: INITIAL   ASSESSMENT:  CLINICAL IMPRESSION: Patient is a 72 y.o. female who was seen today for occupational therapy evaluation for injured left hand middle finger MCP joint and possibly PIP joint from a fall on an outstretched hand.  She has been seeing physical therapy for recent shoulder surgery and states problems and other joints of her body as well as a long history of other joint replacements.  She will benefit from outpatient occupational therapy/hand therapy to decrease symptoms and improve use of her left hand and quality of life.   PERFORMANCE DEFICITS: in functional skills including ADLs, IADLs, coordination, dexterity, edema, ROM, strength, pain, fascial restrictions, flexibility, Fine motor control, Gross motor control, body mechanics, endurance, decreased knowledge of precautions, and UE functional use, cognitive skills including problem solving and safety awareness, and psychosocial skills including coping strategies, habits, and routines and behaviors.   IMPAIRMENTS: are limiting patient from ADLs, IADLs, leisure, and social participation.   COMORBIDITIES: may have co-morbidities  that affects occupational performance. Patient will benefit from skilled OT to address above impairments and improve overall function.  MODIFICATION OR ASSISTANCE TO COMPLETE EVALUATION: No modification of tasks or assist necessary to complete an evaluation.  OT OCCUPATIONAL PROFILE AND  HISTORY: Detailed assessment: Review of records and additional review of physical, cognitive, psychosocial history related to current functional performance.  CLINICAL DECISION MAKING: Moderate - several treatment options, min-mod task modification necessary  REHAB POTENTIAL: Good  EVALUATION COMPLEXITY: Low      PLAN:  OT FREQUENCY: 2x/week (to begin, tapering as symptoms improve)   OT DURATION: 6 weeks  PLANNED INTERVENTIONS: self care/ADL training, therapeutic exercise, therapeutic activity, neuromuscular re-education, manual therapy, passive range of motion, splinting, electrical stimulation, ultrasound, fluidotherapy, compression bandaging, moist heat, cryotherapy, contrast bath, patient/family education, coping strategies training, DME and/or AE instructions, and Dry needling  RECOMMENDED OTHER SERVICES: none now   CONSULTED AND AGREED WITH PLAN OF CARE: Patient  PLAN FOR NEXT SESSION:  Check safe position orthosis for fit and function, upgrade her home exercise program as tolerated and if less tender, start to wean from orthosis.  If PIP contracture remains strong she should continue this orthosis with more pressure to straighten the PIP joint each night and ultimately a static progressive or dynamic extension orthosis may become necessary.   Fannie Knee, OTR/L, CHT 02/01/2023, 6:34 PM

## 2023-02-01 ENCOUNTER — Encounter (HOSPITAL_BASED_OUTPATIENT_CLINIC_OR_DEPARTMENT_OTHER): Payer: Self-pay | Admitting: *Deleted

## 2023-02-01 ENCOUNTER — Encounter: Payer: Self-pay | Admitting: Rehabilitative and Restorative Service Providers"

## 2023-02-01 ENCOUNTER — Encounter: Payer: 59 | Admitting: Internal Medicine

## 2023-02-01 ENCOUNTER — Ambulatory Visit (INDEPENDENT_AMBULATORY_CARE_PROVIDER_SITE_OTHER): Payer: 59 | Admitting: Rehabilitative and Restorative Service Providers"

## 2023-02-01 ENCOUNTER — Other Ambulatory Visit: Payer: Self-pay

## 2023-02-01 DIAGNOSIS — M25642 Stiffness of left hand, not elsewhere classified: Secondary | ICD-10-CM

## 2023-02-01 DIAGNOSIS — M6281 Muscle weakness (generalized): Secondary | ICD-10-CM

## 2023-02-01 DIAGNOSIS — M79642 Pain in left hand: Secondary | ICD-10-CM

## 2023-02-05 NOTE — Therapy (Signed)
OUTPATIENT OCCUPATIONAL THERAPY TREATMENT NOTE   Patient Name: Melissa [name] MRN: [id number] DOB:[birth year], 72 y.o., female Today's Date: 02/06/2023  PCP: Miguel Aschoff, MD REFERRING PROVIDER:  Tarry Kos, MD    END OF SESSION:  OT End of Session - 02/06/23 1257     Visit Number 2    Number of Visits 10    Date for OT Re-Evaluation 03/16/23    Authorization Type [employer]    OT Start Time 1258    OT Stop Time 1348    OT Time Calculation (min) 50 min    Equipment Utilized During Treatment orthotic strapping    Activity Tolerance Patient tolerated treatment well;Patient limited by pain;Patient limited by fatigue;No increased pain    Behavior During Therapy [facility] for tasks assessed/performed;Anxious            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   Plantar fasciitis of left foot 10/01/2013   PONV (postoperative nausea and vomiting)    Primary osteoarthritis of left knee 07/11/2021   Right-sided face pain 01/07/2019   Rotator cuff tear 02/02/2017   Seasonal allergies    Sleep apnea 2022   no cpap   Trigger middle finger of right hand 11/12/2018   Vitreous degeneration, unspecified eye 10/25/2012   Past Surgical History:  Procedure Laterality Date   ARTERY BIOPSY Right 01/23/2019   Procedure: REMOVAL OF PART OF RIGHT TEMPORAL ARTERY FOR BIOPSY;  Surgeon: Karie Soda, MD;  Location: [facility] OR;  Service: General;  Laterality: Right;   CATARACT  EXTRACTION W/ INTRAOCULAR LENS IMPLANT Bilateral    COLONOSCOPY  04/2017   TA, diverticulosis  ([facility])   GLAUCOMA SURGERY     HAMMER TOE SURGERY Bilateral    LAPAROSCOPIC SUPRACERVICAL HYSTERECTOMY  2007   with BSO, cervix remains.  Fibroids.   Right hip replacement     SHOULDER ARTHROSCOPY Left 12/12/2022   Procedure: LEFT SHOULDER ARTHROSCOPY WITH BALLOON SPACER;  Surgeon: Huel Cote, MD;  Location:  SURGERY CENTER;  Service: Orthopedics;  Laterality: Left;   TONSILLECTOMY     TOTAL HIP ARTHROPLASTY Left 2021   TOTAL KNEE ARTHROPLASTY Right 2004   TKR (Dr. 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   Decreased functional mobility 02/01/2022   Personal history of colonic polyps 01/26/2022   History of total knee  replacement, bilateral 07/11/2021   Sleep apnea, intolerant of CPAP, declines 05/24/2021   Healthcare maintenance 01/07/2019   Chronic left shoulder pain 01/08/2018   History of bilateral hip replacements 07/19/2017   Acquired renal cyst of left kidney 01/01/2016   Spondylosis without myelopathy or radiculopathy, 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/2014   Obesity 11/13/2012   Glaucoma    Type 2 diabetes mellitus, controlled (HCC), without complications    Anxiety and depression    HTN (hypertension) 01/06/2012   GERD (gastroesophageal reflux disease) 01/06/2012   Hypercholesteremia 01/06/2012    ONSET DATE: ~Jan 2024 fall  REFERRING DIAG: M79.645 (ICD-10-CM) - Pain of left middle  finger   THERAPY DIAG:  Pain in left hand  Stiffness of left hand, not elsewhere classified  Muscle weakness (generalized)  Rationale for Evaluation and Treatment: Rehabilitation  PERTINENT HISTORY: Per MD: "Eval and treat left middle finger PIP contracture. S/p sprain and injury.  "  She states that she fell about 2 weeks ago-but in fact she fell about 4 months ago, per medical chart review.  When she fell, she hurt her knee and shoulder but also her left hand.  She had not had any treatment for the left hand yet, but has been seeing physical therapy for her knee and shoulder issues.  Now months later, she has developed a bit of a PIP joint flexion contracture, she is also stiff and very guarded from pain in the left hand.  She presents fairly anxious today about moving her left hand.  She states this problem causes her to have severe difficulties doing tasks around her home.   PRECAUTIONS: Lt  Shoulder;  WEIGHT BEARING RESTRICTIONS: yes, caution with Lt shoulder ,though hand therpay should not interfere with this much  SUBJECTIVE:   SUBJECTIVE STATEMENT: She states nothing new, orthosis works well, tightness comes and goes.   PAIN:  Are you having pain?  Not at rest, and less since last seen  PATIENT GOALS: To have better motion and ability in left nondominant hand.   OBJECTIVE: (All objective assessments below are from initial evaluation on: 02/01/23 unless otherwise specified.)   HAND DOMINANCE: Right   ADLs: Overall ADLs: States decreased ability to grab, hold household objects, pain and inability to open containers, perform FMS tasks (manipulate fasteners on clothing), mild to moderate bathing problems as well.    FUNCTIONAL OUTCOME MEASURES: Eval: Quck DASH 66% impairment today  (Higher % Score  =  More Impairment)    UPPER EXTREMITY ROM     Shoulder to Wrist AROM Left eval  Wrist flexion 72  Wrist extension 55  (Blank rows = not tested)   Hand AROM Left eval  Lt 02/06/23  Full Fist Ability (or Gap to Distal Palmar Crease) 5cm gap form MF to [facility]   Thumb Opposition  (Kapandji Scale)  Unable to oppose to MF but able to all others   Long MCP (0-90) 0-  37 0 - 56  Long PIP (0-100) (-32) -  78 (-34) - 96  Long DIP (0-70) 0-  32 0 - 58  (Blank rows = not tested)   UPPER EXTREMITY MMT:    Eval:  NT at eval due to recent and still healing injuries. Will be tested when/as appropriate.   MMT Left TBD  Wrist flexion   Wrist extension   Wrist ulnar deviation   Wrist radial deviation   (Blank rows = not tested)  HAND FUNCTION: Eval: Observed weakness in affected hand.  Grip strength Right: 43  lbs, Left: 13 lbs painful  COORDINATION: Eval: Observed coordination impairments with affected Lt hand. 9 Hole Peg Test  Left: TBD sec   SENSATION: Eval:  Light touch intact today  EDEMA:   Eval:  Mildly swollen in Lt hand volarly in MF through MCP J to PIP J   OBSERVATIONS:   Eval: no instability noted at Lt MF MCP J or PIP or DIPJs, tender to palpation at volar MCP J, along P1 and into PIP J.  Suspicion that she hyperextended her MCP J and possibly PIP J, spraining ligament or straining tendon.  She seems to have a wicked habit of guarding from pain and holding her hand out stiffly, and this is likely exacerbated her issue.  She has resulting PIP joint flexion contracture and somewhat unwillingness to try to bend the MCP joint.   TODAY'S TREATMENT:  02/06/23: She performs active range of motion for exercise as well as new measures today.  She still shows significant guarding to both her left shoulder and left hand but her motion has improved by about doubled since first seen.  Orthotic is working well and is adjusted to allow slightly more PIP joint extension.  She is also given additional strapping materials to use in the night as she states she is a "germ-o-phobe" and does not want to wear the same straps in the night.  She is then provided a comprehensive  home exercise program as below, which she does well with but has some tightness and pain in the MCP joint similar to trigger finger with full MCP flexion stretch.  She was cautioned to be extra careful on the stretch and not cause pain.  She tolerated the PIP joint extension stretch very well today.  She was educated to use moist heat before beginning stretches to help loosen up and reduce pain.  She states understanding all and will attempt to do these until next seen   Exercises - Wrist AAROM Flexion and Extension  - 4 x daily - 10-15 reps - Wrist Prayer Stretch  - 4 x daily - 3-5 reps - 15 sec hold - Tendon Glides  - 4-6 x daily - 3-5 reps - 2-3 seconds hold - BACK KNUCKLE STRETCHES   - 4 x daily - 3-5 reps - 15 sec hold - HOOK Stretch  - 4 x daily - 3-5 reps - 15-20 sec hold - PUSH KNUCKLES DOWN  - 4 x daily - 3-5 reps - 15 seconds hold   PATIENT EDUCATION: Education details: See tx section above for details  Person educated: Patient Education method: Engineer, structural, Teach back, Handouts  Education comprehension: States and demonstrates understanding, Additional Education required    HOME EXERCISE PROGRAM: Access Code: [email or web address] URL: [email or web address] Date: 02/06/2023 Prepared by: Fannie Knee   GOALS: Goals reviewed with patient? Yes   SHORT TERM GOALS: (STG required if POC>30 days) Target Date: 02/16/23  Pt will obtain protective, custom orthotic. Goal status:  MET (may need adjustments)   2.  Pt will demo/state understanding of initial HEP to improve pain levels and prerequisite motion. Goal status: INITIAL   LONG TERM GOALS: Target Date: 03/16/23  Pt will improve functional ability by decreased impairment per Quick DASH assessment from 66% to 25% or better, for better quality of life. Goal status: INITIAL  2.  Pt will improve grip strength in Lt hand from painful 13lbs to at least non-painful 25lbs for functional use at home and in  IADLs. Goal status: INITIAL  3.  Pt will improve A/ROM TAM in LT MF from 115* to at least 210*, to have functional motion for tasks like reach and grasp.  Goal status: INITIAL  4.  Pt will improve coordination skills in Lt hand, as seen by [facility] score on 9 Hole Peg testing to have increased functional ability to carry out fine motor tasks (fasteners, etc.) and more complex, coordinated IADLs (meal prep, sports, etc.).  Goal status: INITIAL  5.  Pt will decrease pain at worst from 6/10 to 2/10 or better to have better sleep and occupational participation in daily roles. Goal status: INITIAL   ASSESSMENT:  CLINICAL IMPRESSION: 02/06/23: She continues to present like a stiff hand that is intensely guarded from pain likely keeping her stiff and tender.  OT is slightly suspicious for trigger finger forming in the left hand middle finger MCP joint , as this is how her tenderness presents somewhat, and as she had one in the right hand middle finger in the past.  OT will monitor that and continue to use bracing to allow for rest.  Differential diagnosis is some ligament injury that has not healed as she has not rested it enough.   PLAN:  OT FREQUENCY: 2x/week (to begin, tapering as symptoms improve)   OT DURATION: 6 weeks  PLANNED INTERVENTIONS: self care/ADL training, therapeutic exercise, therapeutic activity, neuromuscular re-education, manual therapy, passive range of motion, splinting, electrical stimulation, ultrasound, fluidotherapy, compression bandaging, moist heat, cryotherapy, contrast bath, patient/family education, coping strategies training, DME and/or AE instructions, and Dry needling  CONSULTED AND AGREED WITH PLAN OF CARE: Patient  PLAN FOR NEXT SESSION:  Continue to monitor range of motion, tenderness at the MCP joint volarly, orthosis use allow him to rest in case of ligament injury or tenosynovitis, once motion full start with strengthening as tolerated.   Fannie Knee,  OTR/L, CHT 02/06/2023, 1:59 PM

## 2023-02-06 ENCOUNTER — Encounter: Payer: Self-pay | Admitting: Rehabilitative and Restorative Service Providers"

## 2023-02-06 ENCOUNTER — Ambulatory Visit (INDEPENDENT_AMBULATORY_CARE_PROVIDER_SITE_OTHER): Payer: 59 | Admitting: Rehabilitative and Restorative Service Providers"

## 2023-02-06 DIAGNOSIS — M25642 Stiffness of left hand, not elsewhere classified: Secondary | ICD-10-CM | POA: Diagnosis not present

## 2023-02-06 DIAGNOSIS — M79642 Pain in left hand: Secondary | ICD-10-CM

## 2023-02-06 DIAGNOSIS — M6281 Muscle weakness (generalized): Secondary | ICD-10-CM

## 2023-02-06 DIAGNOSIS — L719 Rosacea, unspecified: Secondary | ICD-10-CM | POA: Insufficient documentation

## 2023-02-06 NOTE — Assessment & Plan Note (Signed)
128/81,  much better control on new regimen of losartan 50 mg daily (stopped lisinopril, cough nearly resolved; stopped HCTZ).

## 2023-02-06 NOTE — Assessment & Plan Note (Addendum)
Dark thickened eczematous appearing skin  and red papules primarily on cheeks, a couple spots on forehead, chin and below chin.  Spares nose and nasolabial folds, spares the vermilion border. Pruritic. Trial metronidazole topical. Differential includes perioral dermatitis.

## 2023-02-06 NOTE — Assessment & Plan Note (Addendum)
Difficulty initiating sleep.  Sleep habits discussed.  Agreeable to trial of Ramelteon. Has used alprazolam for sleep in the past, which we are trying to steer away from.

## 2023-02-06 NOTE — Assessment & Plan Note (Signed)
A1c 5.8; acquired supply of Trulicity 1.5 weekly (after it was unavailable for a time). Doing well, no changes.

## 2023-02-07 DIAGNOSIS — M47816 Spondylosis without myelopathy or radiculopathy, lumbar region: Secondary | ICD-10-CM | POA: Diagnosis not present

## 2023-02-08 ENCOUNTER — Encounter: Payer: 59 | Admitting: Rehabilitative and Restorative Service Providers"

## 2023-02-12 NOTE — Therapy (Signed)
OUTPATIENT OCCUPATIONAL THERAPY TREATMENT NOTE   Patient Name: Melissa [name] MRN: [id number] DOB:[birth year], 72 y.o., female Today's Date: 02/06/2023  PCP: Miguel Aschoff, MD REFERRING PROVIDER:  Tarry Kos, MD    END OF SESSION:  OT End of Session - 02/06/23 1257     Visit Number 2    Number of Visits 10    Date for OT Re-Evaluation 03/16/23    Authorization Type U[employer]    OT Start Time 1258    OT Stop Time 1348    OT Time Calculation (min) 50 min    Equipment Utilized During Treatment orthotic strapping    Activity Tolerance Patient tolerated treatment well;Patient limited by pain;Patient limited by fatigue;No increased pain    Behavior During Therapy [facility] for tasks assessed/performed;Anxious            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   Plantar fasciitis of left foot 10/01/2013   PONV (postoperative nausea and vomiting)    Primary osteoarthritis of left knee 07/11/2021   Right-sided face pain 01/07/2019   Rotator cuff tear 02/02/2017   Seasonal allergies    Sleep apnea 2022   no cpap   Trigger middle finger of right hand 11/12/2018   Vitreous degeneration, unspecified eye 10/25/2012   Past Surgical History:  Procedure Laterality Date   ARTERY BIOPSY Right 01/23/2019   Procedure: REMOVAL OF PART OF RIGHT TEMPORAL ARTERY FOR BIOPSY;  Surgeon: Karie Soda, MD;  Location: [facility] OR;  Service: General;  Laterality: Right;   CATARACT  EXTRACTION W/ INTRAOCULAR LENS IMPLANT Bilateral    COLONOSCOPY  04/2017   TA, diverticulosis  ([facility])   GLAUCOMA SURGERY     HAMMER TOE SURGERY Bilateral    LAPAROSCOPIC SUPRACERVICAL HYSTERECTOMY  2007   with BSO, cervix remains.  Fibroids.   Right hip replacement     SHOULDER ARTHROSCOPY Left 12/12/2022   Procedure: LEFT SHOULDER ARTHROSCOPY WITH BALLOON SPACER;  Surgeon: Huel Cote, MD;  Location:  SURGERY CENTER;  Service: Orthopedics;  Laterality: Left;   TONSILLECTOMY     TOTAL HIP ARTHROPLASTY Left 2021   TOTAL KNEE ARTHROPLASTY Right 2004   TKR (Dr. 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   Decreased functional mobility 02/01/2022   Personal history of colonic polyps 01/26/2022   History of total knee  replacement, bilateral 07/11/2021   Sleep apnea, intolerant of CPAP, declines 05/24/2021   Healthcare maintenance 01/07/2019   Chronic left shoulder pain 01/08/2018   History of bilateral hip replacements 07/19/2017   Acquired renal cyst of left kidney 01/01/2016   Spondylosis without myelopathy or radiculopathy, 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/2014   Obesity 11/13/2012   Glaucoma    Type 2 diabetes mellitus, controlled (HCC), without complications    Anxiety and depression    HTN (hypertension) 01/06/2012   GERD (gastroesophageal reflux disease) 01/06/2012   Hypercholesteremia 01/06/2012    ONSET DATE: ~Jan 2024 fall  REFERRING DIAG: M79.645 (ICD-10-CM) - Pain of left middle  finger   THERAPY DIAG:  Pain in left hand  Stiffness of left hand, not elsewhere classified  Muscle weakness (generalized)  Rationale for Evaluation and Treatment: Rehabilitation  PERTINENT HISTORY: Per MD: "Eval and treat left middle finger PIP contracture. S/p sprain and injury.  "  She states that she fell about 2 weeks ago-but in fact she fell about 4 months ago, per medical chart review.  When she fell, she hurt her knee and shoulder but also her left hand.  She had not had any treatment for the left hand yet, but has been seeing physical therapy for her knee and shoulder issues.  Now months later, she has developed a bit of a PIP joint flexion contracture, she is also stiff and very guarded from pain in the left hand.  She presents fairly anxious today about moving her left hand.  She states this problem causes her to have severe difficulties doing tasks around her home.   PRECAUTIONS: Lt  Shoulder;  WEIGHT BEARING RESTRICTIONS: yes, caution with Lt shoulder ,though hand therpay should not interfere with this much  SUBJECTIVE:   SUBJECTIVE STATEMENT: She states ***   nothing new, orthosis works well, tightness comes and goes.   PAIN:  Are you having pain?  Not at rest, and less since last seen  PATIENT GOALS: To have better motion and ability in left nondominant hand.   OBJECTIVE: (All objective assessments below are from initial evaluation on: 02/01/23 unless otherwise specified.)   HAND DOMINANCE: Right   ADLs: Overall ADLs: States decreased ability to grab, hold household objects, pain and inability to open containers, perform FMS tasks (manipulate fasteners on clothing), mild to moderate bathing problems as well.    FUNCTIONAL OUTCOME MEASURES: Eval: Quck DASH 66% impairment today  (Higher % Score  =  More Impairment)    UPPER EXTREMITY ROM     Shoulder to Wrist AROM Left eval  Wrist flexion 72  Wrist extension 55  (Blank rows = not tested)   Hand AROM  Left eval Lt 02/06/23 Lt 02/13/23  Full Fist Ability (or Gap to Distal Palmar Crease) 5cm gap form MF to [facility]    Thumb Opposition  (Kapandji Scale)  Unable to oppose to MF but able to all others    Long MCP (0-90) 0-  37 0 - 56 0 - ***  Long PIP (0-100) (-32) -  78 (-34) - 96 (-***) - ***  Long DIP (0-70) 0-  32 0 - 58 0 - ***  (Blank rows = not tested)   UPPER EXTREMITY MMT:    Eval:  NT at eval due to recent and still healing injuries. Will be tested when/as appropriate.   MMT Left TBD  Wrist flexion   Wrist extension   Wrist ulnar deviation   Wrist radial deviation   (Blank rows =  not tested)  HAND FUNCTION: Eval: Observed weakness in affected hand.  Grip strength Right: 43 lbs, Left: 13 lbs painful  COORDINATION: Eval: Observed coordination impairments with affected Lt hand. 9 Hole Peg Test  Left: TBD sec   EDEMA:   Eval:  Mildly swollen in Lt hand volarly in MF through MCP J to PIP J   OBSERVATIONS:   Eval: no instability noted at Lt MF MCP J or PIP or DIPJs, tender to palpation at volar MCP J, along P1 and into PIP J.  Suspicion that she hyperextended her MCP J and possibly PIP J, spraining ligament or straining tendon.  She seems to have a wicked habit of guarding from pain and holding her hand out stiffly, and this is likely exacerbated her issue.  She has resulting PIP joint flexion contracture and somewhat unwillingness to try to bend the MCP joint.   TODAY'S TREATMENT:  02/13/23: *** Continue to monitor range of motion, tenderness at the MCP joint volarly, orthosis use allow herm to rest in case of ligament injury or tenosynovitis, once motion full start with strengthening as tolerated.  02/06/23: She performs active range of motion for exercise as well as new measures today.  She still shows significant guarding to both her left shoulder and left hand but her motion has improved by about doubled since first seen.  Orthotic is working well and is adjusted to allow  slightly more PIP joint extension.  She is also given additional strapping materials to use in the night as she states she is a "germ-o-phobe" and does not want to wear the same straps in the night.  She is then provided a comprehensive home exercise program as below, which she does well with but has some tightness and pain in the MCP joint similar to trigger finger with full MCP flexion stretch.  She was cautioned to be extra careful on the stretch and not cause pain.  She tolerated the PIP joint extension stretch very well today.  She was educated to use moist heat before beginning stretches to help loosen up and reduce pain.  She states understanding all and will attempt to do these until next seen   Exercises - Wrist AAROM Flexion and Extension  - 4 x daily - 10-15 reps - Wrist Prayer Stretch  - 4 x daily - 3-5 reps - 15 sec hold - Tendon Glides  - 4-6 x daily - 3-5 reps - 2-3 seconds hold - BACK KNUCKLE STRETCHES   - 4 x daily - 3-5 reps - 15 sec hold - HOOK Stretch  - 4 x daily - 3-5 reps - 15-20 sec hold - PUSH KNUCKLES DOWN  - 4 x daily - 3-5 reps - 15 seconds hold   PATIENT EDUCATION: Education details: See tx section above for details  Person educated: Patient Education method: Engineer, structural, Teach back, Handouts  Education comprehension: States and demonstrates understanding, Additional Education required    HOME EXERCISE PROGRAM: Access Code: [email or web address] URL: [email or web address] Date: 02/06/2023 Prepared by: Fannie Knee   GOALS: Goals reviewed with patient? Yes   SHORT TERM GOALS: (STG required if POC>30 days) Target Date: 02/16/23  Pt will obtain protective, custom orthotic. Goal status:  MET (may need adjustments)   2.  Pt will demo/state understanding of initial HEP to improve pain levels and prerequisite motion. Goal status: INITIAL   LONG TERM GOALS: Target Date: 03/16/23  Pt will improve functional ability by decreased impairment per  Quick DASH assessment from 66%  to 25% or better, for better quality of life. Goal status: INITIAL  2.  Pt will improve grip strength in Lt hand from painful 13lbs to at least non-painful 25lbs for functional use at home and in IADLs. Goal status: INITIAL  3.  Pt will improve A/ROM TAM in LT MF from 115* to at least 210*, to have functional motion for tasks like reach and grasp.  Goal status: INITIAL  4.  Pt will improve coordination skills in Lt hand, as seen by [facility] score on 9 Hole Peg testing to have increased functional ability to carry out fine motor tasks (fasteners, etc.) and more complex, coordinated IADLs (meal prep, sports, etc.).  Goal status: INITIAL  5.  Pt will decrease pain at worst from 6/10 to 2/10 or better to have better sleep and occupational participation in daily roles. Goal status: INITIAL   ASSESSMENT:  CLINICAL IMPRESSION: 02/13/23: ***  02/06/23: She continues to present like a stiff hand that is intensely guarded from pain likely keeping her stiff and tender.  OT is slightly suspicious for trigger finger forming in the left hand middle finger MCP joint , as this is how her tenderness presents somewhat, and as she had one in the right hand middle finger in the past.  OT will monitor that and continue to use bracing to allow for rest.  Differential diagnosis is some ligament injury that has not healed as she has not rested it enough.   PLAN:  OT FREQUENCY: 2x/week (to begin, tapering as symptoms improve)   OT DURATION: 6 weeks  PLANNED INTERVENTIONS: self care/ADL training, therapeutic exercise, therapeutic activity, neuromuscular re-education, manual therapy, passive range of motion, splinting, electrical stimulation, ultrasound, fluidotherapy, compression bandaging, moist heat, cryotherapy, contrast bath, patient/family education, coping strategies training, DME and/or AE instructions, and Dry needling  CONSULTED AND AGREED WITH PLAN OF CARE: Patient  PLAN FOR  NEXT SESSION:  ***   Fannie Knee, OTR/L, CHT 02/06/2023, 1:59 PM

## 2023-02-13 ENCOUNTER — Ambulatory Visit (INDEPENDENT_AMBULATORY_CARE_PROVIDER_SITE_OTHER): Payer: 59 | Admitting: Rehabilitative and Restorative Service Providers"

## 2023-02-13 ENCOUNTER — Encounter: Payer: Self-pay | Admitting: Rehabilitative and Restorative Service Providers"

## 2023-02-13 DIAGNOSIS — M25642 Stiffness of left hand, not elsewhere classified: Secondary | ICD-10-CM

## 2023-02-13 DIAGNOSIS — M6281 Muscle weakness (generalized): Secondary | ICD-10-CM

## 2023-02-13 DIAGNOSIS — M79642 Pain in left hand: Secondary | ICD-10-CM | POA: Diagnosis not present

## 2023-02-13 NOTE — Therapy (Signed)
OUTPATIENT OCCUPATIONAL THERAPY TREATMENT NOTE   Patient Name: Melissa [name] MRN: [id number] DOB:07/05/1951, 72 y.o., female Today's Date: 02/15/2023  PCP: Miguel Aschoff, MD REFERRING PROVIDER:  Tarry Kos, MD    END OF SESSION:  OT End of Session - 02/15/23 1302     Visit Number 4    Number of Visits 10    Date for OT Re-Evaluation 03/16/23    Authorization Type UHC    OT Start Time 1302    OT Stop Time 1346    OT Time Calculation (min) 44 min    Activity Tolerance Patient tolerated treatment well;Patient limited by pain;Patient limited by fatigue;No increased pain    Behavior During Therapy [facility] for tasks assessed/performed;Anxious             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   Plantar fasciitis of left foot 10/01/2013   PONV (postoperative nausea and vomiting)    Primary osteoarthritis of left knee 07/11/2021   Right-sided face pain 01/07/2019   Rotator cuff tear 02/02/2017   Seasonal allergies    Sleep apnea 2022   no cpap   Trigger middle finger of right hand 11/12/2018   Vitreous degeneration, unspecified eye 10/25/2012   Past Surgical History:  Procedure Laterality Date   ARTERY BIOPSY Right 01/23/2019   Procedure: REMOVAL OF PART OF RIGHT TEMPORAL ARTERY FOR BIOPSY;  Surgeon: Karie Soda, MD;  Location: [facility] OR;  Service: General;  Laterality: Right;   CATARACT EXTRACTION W/ INTRAOCULAR LENS IMPLANT Bilateral    COLONOSCOPY   04/2017   TA, diverticulosis  ([facility])   GLAUCOMA SURGERY     HAMMER TOE SURGERY Bilateral    LAPAROSCOPIC SUPRACERVICAL HYSTERECTOMY  2007   with BSO, cervix remains.  Fibroids.   Right hip replacement     SHOULDER ARTHROSCOPY Left 12/12/2022   Procedure: LEFT SHOULDER ARTHROSCOPY WITH BALLOON SPACER;  Surgeon: Huel Cote, MD;  Location: [facility];  Service: Orthopedics;  Laterality: Left;   TONSILLECTOMY     TOTAL HIP ARTHROPLASTY Left 2021   TOTAL KNEE ARTHROPLASTY Right 2004   TKR (Dr. 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   Decreased functional mobility 02/01/2022   Personal history of colonic polyps 01/26/2022   History of total knee replacement, bilateral 07/11/2021  Sleep apnea, intolerant of CPAP, declines 05/24/2021   Healthcare maintenance 01/07/2019   Chronic left shoulder pain 01/08/2018   History of bilateral hip replacements 07/19/2017   Acquired renal cyst of left kidney 01/01/2016   Spondylosis without myelopathy or radiculopathy, 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/2014   Obesity 11/13/2012   Glaucoma    Type 2 diabetes mellitus, controlled (HCC), without complications    Anxiety and depression    HTN (hypertension) 01/06/2012   GERD (gastroesophageal reflux disease) 01/06/2012   Hypercholesteremia 01/06/2012    ONSET DATE: ~Jan 2024 fall  REFERRING DIAG: M79.645 (ICD-10-CM) - Pain of left middle finger   THERAPY DIAG:   Pain in left hand  Stiffness of left hand, not elsewhere classified  Muscle weakness (generalized)  Rationale for Evaluation and Treatment: Rehabilitation  PERTINENT HISTORY: Per MD: "Eval and treat left middle finger PIP contracture. S/p sprain and injury.  "  She states that she fell about 2 weeks ago-but in fact she fell about 4 months ago, per medical chart review.  When she fell, she hurt her knee and shoulder but also her left hand.  She had not had any treatment for the left hand yet, but has been seeing physical therapy for her knee and shoulder issues.  Now months later, she has developed a bit of a PIP joint flexion contracture, she is also stiff and very guarded from pain in the left hand.  She presents fairly anxious today about moving her left hand.  She states this problem causes her to have severe difficulties doing tasks around her home.   PRECAUTIONS: Lt  Shoulder;  WEIGHT BEARING RESTRICTIONS: yes, caution with Lt shoulder ,though hand therpay should not interfere with this much  SUBJECTIVE:   SUBJECTIVE STATEMENT: She states wearing orthosis more and notices her finger is more straight.     PAIN:  Are you having pain?  Not at rest, and less since last seen  PATIENT GOALS: To have better motion and ability in left nondominant hand.   OBJECTIVE: (All objective assessments below are from initial evaluation on: 02/01/23 unless otherwise specified.)   HAND DOMINANCE: Right   ADLs: Overall ADLs: States decreased ability to grab, hold household objects, pain and inability to open containers, perform FMS tasks (manipulate fasteners on clothing), mild to moderate bathing problems as well.    FUNCTIONAL OUTCOME MEASURES: Eval: Quck DASH 66% impairment today  (Higher % Score  =  More Impairment)    UPPER EXTREMITY ROM     Shoulder to Wrist AROM Left eval  Wrist flexion 72  Wrist extension 55  (Blank rows = not tested)   Hand AROM Left eval Lt 02/06/23  Lt 02/13/23 Lt 02/15/23  Full Fist Ability (or Gap to Distal Palmar Crease) 5cm gap form MF to [facility]     Thumb Opposition  (Kapandji Scale)  Unable to oppose to MF but able to all others     Long MCP (0-90) 0-  37 0 - 56 0 - 65 0- 65  Long PIP (0-100) (-32) -  78 (-34) - 96 (-16) - 90 (-5) - 95  Long DIP (0-70) 0-  32 0 - 58 0 - 47 0- 48  (Blank rows = not tested)   UPPER EXTREMITY MMT:    Eval:  NT at eval due to recent and still healing injuries. Will be tested when/as appropriate.   MMT Left TBD  Wrist flexion   Wrist extension   Wrist ulnar deviation   Wrist radial  deviation   (Blank rows = not tested)  HAND FUNCTION: 02/15/23: Grip Lt: 17#  Eval: Observed weakness in affected hand.  Grip strength Right: 43 lbs, Left: 13 lbs painful  COORDINATION: 02/15/23: 9 Hole Peg Test  Rt: 24sec; Left: 24 sec   Eval: Observed coordination impairments with affected Lt hand.   EDEMA:   Eval:  Mildly swollen in Lt hand volarly in MF through MCP J to PIP J   OBSERVATIONS:   Eval: no instability noted at Lt MF MCP J or PIP or DIPJs, tender to palpation at volar MCP J, along P1 and into PIP J.  Suspicion that she hyperextended her MCP J and possibly PIP J, spraining ligament or straining tendon.  She seems to have a wicked habit of guarding from pain and holding her hand out stiffly, and this is likely exacerbated her issue.  She has resulting PIP joint flexion contracture and somewhat unwillingness to try to bend the MCP joint.   TODAY'S TREATMENT:  02/15/23: She does AROM for exercise and new measures which shows great improvement straightening out PIP J. Also improvements in flexion. OT does manual therapy IASTM to length of MF feeling tightness and crepitous through tendons.  She tolerates light gripping better than at evaluation and we decide to start putty gripping, slowly, 1-2 x day, but stop if very sore or painful. She does 5 reps with ~10 sec squeezes and only has minor pain/soreness.  She is recommended to use ice at end if needed for soreness, continue to stretch 4-6 x day, and wear brace to avoid pain, most of the time    02/13/23: She starts with AROM for exercise as well as new measures which show excellent PIP J ext progress, but a bit tighter at tip of MF. Due to that, OT reviews her HEP with her including newer ideas of "hook" stretch to help IP Js of MF loosen up.She performs back all of the below exercises needing cues at times, encouraged to do ~3 x day.  She was also encouraged to wear orthosis at least 4 x day for up to 1 hour at a time to: #1 help rest MCP J as needed and #2 help stretch PIP J into extension.  She states understanding. To further help loosen up finger and help with tenderness/pain, OT uses manual therapy vibration tool to lightly massage lateral bands of MF as well as dorsal and volar areas of PIP J and MCP Js.  She tolerates this well, states feeling some tenderness but overall better at the end.  Guarding much better now.     PATIENT EDUCATION: Education details: See tx section above for details  Person educated: Patient Education method: Verbal Instruction, Teach back, Handouts  Education comprehension: States and demonstrates understanding, Additional Education required    HOME EXERCISE PROGRAM: Access Code: [email or web address] URL: [email or web address] Date: 02/06/2023 Prepared by: Fannie Knee   GOALS: Goals reviewed with patient? Yes   SHORT TERM GOALS: (STG required if POC>30 days) Target Date: 02/16/23  Pt will obtain protective, custom orthotic. Goal status:  MET (may need adjustments)   2.  Pt will demo/state understanding of initial HEP to improve pain levels and prerequisite motion. Goal status: 02/15/23: MET   LONG TERM GOALS: Target Date: 03/16/23  Pt will improve functional ability by decreased impairment per Quick DASH assessment from 66% to 25% or better, for better quality of life. Goal status:  INITIAL  2.  Pt will improve grip strength in Lt hand  from painful 13lbs to at least non-painful 25lbs for functional use at home and in IADLs. Goal status: INITIAL  3.  Pt will improve A/ROM TAM in LT MF from 115* to at least 210*, to have functional motion for tasks like reach and grasp.  Goal status: INITIAL  4.  Pt will improve coordination skills in Lt hand, as seen by [facility] score on 9 Hole Peg testing to have increased functional ability to carry out fine motor tasks (fasteners, etc.) and more complex, coordinated IADLs (meal prep, sports, etc.).  Goal status: INITIAL  5.  Pt will decrease pain at worst from 6/10 to 2/10 or better to have better sleep and occupational participation in daily roles. Goal status: INITIAL   ASSESSMENT:  CLINICAL IMPRESSION: 02/15/23:  She tolerates light strengthening well today, still working on guarding, continue   02/13/23: She has improvement of both pain and stiffness today, though was not doing Hook stretches or wearing orthosis consistently. IP Js still stiff and MCP J volar still a bit tender.  Carry on  02/06/23: She continues to present like a stiff hand that is intensely guarded from pain likely keeping her stiff and tender.  OT is slightly suspicious for trigger finger forming in the left hand middle finger MCP joint , as this is how her tenderness presents somewhat, and as she had one in the right hand middle finger in the past.  OT will monitor that and continue to use bracing to allow for rest.  Differential diagnosis is some ligament injury that has not healed as she has not rested it enough.   PLAN:  OT FREQUENCY: 2x/week (to begin, tapering as symptoms improve)   OT DURATION: 6 weeks  PLANNED INTERVENTIONS: self care/ADL training, therapeutic exercise, therapeutic activity, neuromuscular re-education, manual therapy, passive range of motion, splinting, electrical stimulation, ultrasound, fluidotherapy, compression bandaging, moist heat,  cryotherapy, contrast bath, patient/family education, coping strategies training, DME and/or AE instructions, and Dry needling  CONSULTED AND AGREED WITH PLAN OF CARE: Patient  PLAN FOR NEXT SESSION:  Check motion and new putty activity, continue to upgrade    [employer], OTR/L, CHT 02/15/2023, 1:50 PM

## 2023-02-14 ENCOUNTER — Ambulatory Visit (INDEPENDENT_AMBULATORY_CARE_PROVIDER_SITE_OTHER): Payer: 59 | Admitting: Orthopaedic Surgery

## 2023-02-14 DIAGNOSIS — M75112 Incomplete rotator cuff tear or rupture of left shoulder, not specified as traumatic: Secondary | ICD-10-CM

## 2023-02-14 NOTE — Progress Notes (Signed)
Post Operative Evaluation    Procedure/Date of Surgery: Left subacromial balloon spacer 4/9  Interval History:    Presents today for follow-up of her left shoulder subacromial spacer.  Overall she is done with physical therapy at this time and her overhead range of motion and pain is much improved.   PMH/PSH/Family History/Social History/Meds/Allergies:    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   Plantar fasciitis of left foot 10/01/2013   PONV (postoperative nausea and vomiting)    Primary osteoarthritis of left knee 07/11/2021   Right-sided face pain 01/07/2019   Rotator cuff tear 02/02/2017   Seasonal allergies    Sleep apnea 2022   no cpap   Trigger middle finger of right hand 11/12/2018   Vitreous degeneration, unspecified eye 10/25/2012   Past Surgical History:  Procedure Laterality Date   ARTERY BIOPSY Right 01/23/2019   Procedure: REMOVAL OF PART OF RIGHT TEMPORAL ARTERY FOR BIOPSY;  Surgeon: Karie Soda, MD;  Location: [facility] OR;  Service: General;  Laterality: Right;   CATARACT EXTRACTION W/ INTRAOCULAR LENS IMPLANT Bilateral    COLONOSCOPY  04/2017   TA, diverticulosis  ([facility])   GLAUCOMA SURGERY     HAMMER TOE SURGERY Bilateral    LAPAROSCOPIC SUPRACERVICAL HYSTERECTOMY  2007   with BSO, cervix remains.  Fibroids.   Right hip replacement     SHOULDER ARTHROSCOPY Left 12/12/2022   Procedure: LEFT SHOULDER  ARTHROSCOPY WITH BALLOON SPACER;  Surgeon: Huel Cote, MD;  Location: [facility];  Service: Orthopedics;  Laterality: Left;   TONSILLECTOMY     TOTAL HIP ARTHROPLASTY Left 2021   TOTAL KNEE ARTHROPLASTY Right 2004   TKR (Dr. Renae Fickle with [facility])   TOTAL KNEE ARTHROPLASTY Left 07/11/2021   Procedure: LEFT TOTAL KNEE ARTHROPLASTY;  Surgeon: Tarry Kos, MD;  Location: [facility] OR;  Service: Orthopedics;  Laterality: Left;   TOTAL KNEE REVISION Right 03/2014   Dr Lamar Sprinkles [facility]   TOTAL SHOULDER ARTHROPLASTY Right 2022   Social History   Socioeconomic History   Marital status: Single    Spouse name: Not on file   Number of children: 0   Years of education: 16   Highest education level: Not on file  Occupational History   Occupation: work with disabilities/ Retired    Associate Professor: [employer]  Tobacco Use   Smoking status: Never   Smokeless tobacco: Never  Vaping Use   Vaping Use: Never used  Substance and Sexual Activity   Alcohol use: No   Drug use: No   Sexual activity: Not Currently    Birth control/protection: Surgical  Other Topics Concern   Not on file  Social  History Narrative   Caffeine: rare   Lives alone   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[employer]   Fun/Hobbies: Gardening    Social Determinants of Health   Financial Resource Strain: Medium Risk (01/25/2023)   Overall Financial Resource Strain (CARDIA)    Difficulty of Paying Living Expenses: Somewhat hard  Food Insecurity: No Food Insecurity (01/25/2023)   Hunger Vital Sign    Worried About Running Out of Food in the Last Year: Never true    Ran Out of Food in the Last Year: Never true  Transportation Needs: No Transportation Needs (01/25/2023)   PRAPARE - Administrator, Civil Service (Medical): No    Lack of Transportation (Non-Medical): No  Physical Activity: Insufficiently Active (01/25/2023)   Exercise Vital Sign    Days of Exercise per Week: 2 days    Minutes of  Exercise per Session: 40 min  Stress: Stress Concern Present (01/25/2023)   [employer] of Occupational Health - Occupational Stress Questionnaire    Feeling of Stress : Rather much  Social Connections: Unknown (01/25/2023)   Social Connection and Isolation Panel [NHANES]    Frequency of Communication with Friends and Family: Once a week    Frequency of Social Gatherings with Friends and Family: Not on file    Attends Religious Services: Not on file    Active Member of Clubs or Organizations: Not on file    Attends Engineer, structural: More than 4 times per year    Marital Status: Never married   Family History  Problem Relation Age of Onset   Cancer Maternal Grandmother 98       ovarian   Diabetes Maternal Grandmother    Hypertension Maternal Grandmother    Cancer Mother 5       bone, MM   Stroke Other        unsure who   CAD Neg Hx    Colon cancer Neg Hx    Esophageal cancer Neg Hx    Rectal cancer Neg Hx    Stomach cancer Neg Hx    Allergies  Allergen Reactions   Codeine Other (See Comments)    palpitations   Metformin And Related Nausea Only   Oxycodone Nausea And Vomiting   Latex Dermatitis, Itching and Rash   Current Outpatient Medications  Medication Sig Dispense Refill   Accu-Chek FastClix Lancets MISC USE TO TEST BLOOD SUGAR UP TO FOUR TIMES DAILY 306 each 1   ACCU-CHEK GUIDE test strip USE AS DIRECTED FOUR TIMES DAILY 100 strip 11   ALPRAZolam (XANAX) 0.25 MG tablet TAKE 1 TABLET(0.25 MG) BY MOUTH AT BEDTIME AS NEEDED FOR SLEEP 30 tablet 0   aspirin EC 325 MG tablet Take 1 tablet (325 mg total) by mouth daily. 14 tablet 0   blood glucose meter kit and supplies KIT Dispense based on patient and insurance preference. Use up to four times daily as directed. DX Code: E11.9 1 each 0   Dulaglutide (TRULICITY) 1.5 MG/0.5ML SOPN Inject 1.5 mg into the skin once a week. 6 mL 3   escitalopram (LEXAPRO) 10 MG tablet TAKE 1 TABLET(10 MG) BY MOUTH DAILY 90  tablet 3   esomeprazole (NEXIUM) 20 MG capsule Take 20 mg by mouth once a week.     losartan (COZAAR) 50 MG tablet Take 1 tablet (50 mg total) by mouth daily. 30 tablet 5   metroNIDAZOLE (METROGEL) 1 % gel Apply topically daily. Apply to clean dry skin once daily  45 g 0   ramelteon (ROZEREM) 8 MG tablet Take 1 tablet (8 mg total) by mouth at bedtime as needed for sleep. 30 tablet 3   VYZULTA 0.024 % SOLN Place 1 drop into both eyes at bedtime.     No current facility-administered medications for this visit.   No results found.  Review of Systems:   A ROS was performed including pertinent positives and negatives as documented in the HPI.   Musculoskeletal Exam:    There were no vitals taken for this visit.  Left shoulder with well-healed.  Active forward elevation is to 140 degrees with external rotation at the side to 50 degrees.  Internal rotation is to L3.  Strength is much improved  Imaging:      I personally reviewed and interpreted the radiographs.   Assessment:   8 weeks status post left shoulder subacromial balloon spacer overall doing much better.  At today's visit she has no pain with forward elevation.  She is very happy with how she is doing.  I will plan to see her back as needed  Plan :    -Return to clinic as needed      I personally saw and evaluated the patient, and participated in the management and treatment plan.  Huel Cote, MD Attending Physician, Orthopedic Surgery  This document was dictated using Dragon voice recognition software. A reasonable attempt at proof reading has been made to minimize errors.

## 2023-02-15 ENCOUNTER — Ambulatory Visit (INDEPENDENT_AMBULATORY_CARE_PROVIDER_SITE_OTHER): Payer: 59 | Admitting: Rehabilitative and Restorative Service Providers"

## 2023-02-15 ENCOUNTER — Encounter: Payer: Self-pay | Admitting: Rehabilitative and Restorative Service Providers"

## 2023-02-15 ENCOUNTER — Encounter: Payer: 59 | Admitting: Rehabilitative and Restorative Service Providers"

## 2023-02-15 DIAGNOSIS — M79642 Pain in left hand: Secondary | ICD-10-CM | POA: Diagnosis not present

## 2023-02-15 DIAGNOSIS — M6281 Muscle weakness (generalized): Secondary | ICD-10-CM

## 2023-02-15 DIAGNOSIS — M25642 Stiffness of left hand, not elsewhere classified: Secondary | ICD-10-CM | POA: Diagnosis not present

## 2023-02-19 NOTE — Therapy (Signed)
OUTPATIENT OCCUPATIONAL THERAPY TREATMENT NOTE   Patient Name: Melissa [name] MRN: [id number] DOB:12-25-1950, 72 y.o., female Today's Date: 02/19/2023  PCP: Miguel Aschoff, MD REFERRING PROVIDER:  Tarry Kos, MD    END OF SESSION:    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   Plantar fasciitis of left foot 10/01/2013   PONV (postoperative nausea and vomiting)    Primary osteoarthritis of left knee 07/11/2021   Right-sided face pain 01/07/2019   Rotator cuff tear 02/02/2017   Seasonal allergies    Sleep apnea 2022   no cpap   Trigger middle finger of right hand 11/12/2018   Vitreous degeneration, unspecified eye 10/25/2012   Past Surgical History:  Procedure Laterality Date   ARTERY BIOPSY Right 01/23/2019   Procedure: REMOVAL OF PART OF RIGHT TEMPORAL ARTERY FOR BIOPSY;  Surgeon: Karie Soda, MD;  Location: [facility];  Service: General;  Laterality: Right;   CATARACT EXTRACTION W/ INTRAOCULAR LENS IMPLANT Bilateral    COLONOSCOPY  04/2017   TA, diverticulosis  ([facility])   GLAUCOMA SURGERY     HAMMER TOE SURGERY Bilateral    LAPAROSCOPIC SUPRACERVICAL HYSTERECTOMY  2007   with BSO, cervix remains.  Fibroids.   Right hip replacement     SHOULDER ARTHROSCOPY Left 12/12/2022   Procedure: LEFT SHOULDER ARTHROSCOPY WITH BALLOON SPACER;  Surgeon: Huel Cote, MD;  Location: [facility];  Service: Orthopedics;  Laterality: Left;    TONSILLECTOMY     TOTAL HIP ARTHROPLASTY Left 2021   TOTAL KNEE ARTHROPLASTY Right 2004   TKR (Dr. 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   Decreased functional mobility 02/01/2022   Personal history of colonic polyps 01/26/2022   History of total knee replacement, bilateral 07/11/2021   Sleep apnea, intolerant of CPAP, declines 05/24/2021   Healthcare maintenance 01/07/2019   Chronic left shoulder pain 01/08/2018   History of bilateral hip replacements 07/19/2017   Acquired renal cyst of left kidney 01/01/2016   Spondylosis without myelopathy or radiculopathy, 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/2014   Obesity 11/13/2012   Glaucoma    Type 2 diabetes mellitus, controlled (HCC), without complications    Anxiety and depression    HTN (hypertension) 01/06/2012   GERD (gastroesophageal reflux disease) 01/06/2012   Hypercholesteremia 01/06/2012    ONSET DATE: Vanessa Kick  2024 fall  REFERRING DIAG: M79.645 (ICD-10-CM) - Pain of left middle finger   THERAPY DIAG:  No diagnosis found.  Rationale for Evaluation and Treatment: Rehabilitation  PERTINENT HISTORY: Per MD: "Eval and treat left middle finger PIP contracture. S/p sprain and injury.  "  She states that she fell about 2 weeks ago-but in fact she fell about 4 months ago, per medical chart review.  When she fell, she hurt her knee and shoulder but also her left hand.  She had not had any treatment for the left hand yet, but  has been seeing physical therapy for her knee and shoulder issues.  Now months later, she has developed a bit of a PIP joint flexion contracture, she is also stiff and very guarded from pain in the left hand.  She presents fairly anxious today about moving her left hand.  She states this problem causes her to have severe difficulties doing tasks around her home.   PRECAUTIONS: Lt  Shoulder;  WEIGHT BEARING RESTRICTIONS: yes, caution with Lt shoulder ,though hand therpay should not interfere with this much  SUBJECTIVE:   SUBJECTIVE STATEMENT: She states ***   wearing orthosis more and notices her finger is more straight.     PAIN:  Are you having pain?  Not at rest, and less since last seen  PATIENT GOALS: To have better motion and ability in left nondominant hand.   OBJECTIVE: (All objective assessments below are from initial evaluation on: 02/01/23 unless otherwise specified.)   HAND DOMINANCE: Right   ADLs: Overall ADLs: States decreased ability to grab, hold household objects, pain and inability to open containers, perform FMS tasks (manipulate fasteners on clothing), mild to moderate bathing problems as well.    FUNCTIONAL OUTCOME MEASURES: Eval: Quck DASH 66% impairment today  (Higher % Score  =  More Impairment)    UPPER EXTREMITY ROM     Shoulder to Wrist AROM Left eval  Wrist flexion 72  Wrist extension 55  (Blank rows = not tested)   Hand AROM Left eval Lt 02/06/23 Lt 02/13/23 Lt 02/15/23 Lt 02/20/23  Full Fist Ability (or Gap to Distal Palmar Crease) 5cm gap form MF to [facility]      Thumb Opposition  (Kapandji Scale)  Unable to oppose to MF but able to all others      Long MCP (0-90) 0-  37 0 - 56 0 - 65 0- 65 0 -***  Long PIP (0-100) (-32) -  78 (-34) - 96 (-16) - 90 (-5) - 95 0 -***  Long DIP (0-70) 0-  32 0 - 58 0 - 47 0- 48 0 -***  (Blank rows = not tested)   UPPER EXTREMITY MMT:    Eval:  NT at eval due to recent and still healing injuries. Will be tested  when/as appropriate.   MMT Left TBD  Wrist flexion   Wrist extension   Wrist ulnar deviation   Wrist radial deviation   (Blank rows = not tested)  HAND FUNCTION: 02/20/23: grip: ***  02/15/23: Grip Lt: 17#  Eval: Observed weakness in affected hand.  Grip strength Right: 43 lbs, Left: 13 lbs painful  COORDINATION: 02/15/23: 9 Hole Peg Test  Rt: 24sec; Left: 24 sec   Eval: Observed coordination impairments with affected Lt hand.   EDEMA:   Eval:  Mildly swollen in Lt hand volarly in MF through MCP J to PIP J   OBSERVATIONS:   Eval: no instability noted at Lt MF MCP  J or PIP or DIPJs, tender to palpation at volar MCP J, along P1 and into PIP J.  Suspicion that she hyperextended her MCP J and possibly PIP J, spraining ligament or straining tendon.  She seems to have a wicked habit of guarding from pain and holding her hand out stiffly, and this is likely exacerbated her issue.  She has resulting PIP joint flexion contracture and somewhat unwillingness to try to bend the MCP joint.   TODAY'S TREATMENT:  02/20/23: *** Check motion and new putty activity, continue to upgrade    02/15/23: She does AROM for exercise and new measures which shows great improvement straightening out PIP J. Also improvements in flexion. OT does manual therapy IASTM to length of MF feeling tightness and crepitous through tendons.  She tolerates light gripping better than at evaluation and we decide to start putty gripping, slowly, 1-2 x day, but stop if very sore or painful. She does 5 reps with ~10 sec squeezes and only has minor pain/soreness. She is recommended to use ice at end if needed for soreness, continue to stretch 4-6 x day, and wear brace to avoid pain, most of the time    02/13/23: She starts with AROM for exercise as well as new measures which show excellent PIP J ext progress, but a bit tighter at tip of MF. Due to that, OT reviews her HEP with her including newer ideas of "hook" stretch to help IP  Js of MF loosen up.She performs back all of the below exercises needing cues at times, encouraged to do ~3 x day.  She was also encouraged to wear orthosis at least 4 x day for up to 1 hour at a time to: #1 help rest MCP J as needed and #2 help stretch PIP J into extension.  She states understanding. To further help loosen up finger and help with tenderness/pain, OT uses manual therapy vibration tool to lightly massage lateral bands of MF as well as dorsal and volar areas of PIP J and MCP Js.  She tolerates this well, states feeling some tenderness but overall better at the end.  Guarding much better now.     PATIENT EDUCATION: Education details: See tx section above for details  Person educated: Patient Education method: Verbal Instruction, Teach back, Handouts  Education comprehension: States and demonstrates understanding, Additional Education required    HOME EXERCISE PROGRAM: Access Code: [email or web address] URL: [email or web address] Date: 02/06/2023 Prepared by: Fannie Knee   GOALS: Goals reviewed with patient? Yes   SHORT TERM GOALS: (STG required if POC>30 days) Target Date: 02/16/23  Pt will obtain protective, custom orthotic. Goal status:  MET (may need adjustments)   2.  Pt will demo/state understanding of initial HEP to improve pain levels and prerequisite motion. Goal status: 02/15/23: MET   LONG TERM GOALS: Target Date: 03/16/23  Pt will improve functional ability by decreased impairment per Quick DASH assessment from 66% to 25% or better, for better quality of life. Goal status: INITIAL  2.  Pt will improve grip strength in Lt hand from painful 13lbs to at least non-painful 25lbs for functional use at home and in IADLs. Goal status: INITIAL  3.  Pt will improve A/ROM TAM in LT MF from 115* to at least 210*, to have functional motion for tasks like reach and grasp.  Goal status: INITIAL  4.  Pt will improve coordination skills in Lt hand, as seen by [facility]  score on 9 Hole Peg testing to have increased functional  ability to carry out fine motor tasks (fasteners, etc.) and more complex, coordinated IADLs (meal prep, sports, etc.).  Goal status: INITIAL  5.  Pt will decrease pain at worst from 6/10 to 2/10 or better to have better sleep and occupational participation in daily roles. Goal status: INITIAL   ASSESSMENT:  CLINICAL IMPRESSION: 02/20/23: ***  02/15/23:  She tolerates light strengthening well today, still working on guarding, continue   02/13/23: She has improvement of both pain and stiffness today, though was not doing Hook stretches or wearing orthosis consistently. IP Js still stiff and MCP J volar still a bit tender.  Carry on  02/06/23: She continues to present like a stiff hand that is intensely guarded from pain likely keeping her stiff and tender.  OT is slightly suspicious for trigger finger forming in the left hand middle finger MCP joint , as this is how her tenderness presents somewhat, and as she had one in the right hand middle finger in the past.  OT will monitor that and continue to use bracing to allow for rest.  Differential diagnosis is some ligament injury that has not healed as she has not rested it enough.   PLAN:  OT FREQUENCY: 2x/week (to begin, tapering as symptoms improve)   OT DURATION: 6 weeks  PLANNED INTERVENTIONS: self care/ADL training, therapeutic exercise, therapeutic activity, neuromuscular re-education, manual therapy, passive range of motion, splinting, electrical stimulation, ultrasound, fluidotherapy, compression bandaging, moist heat, cryotherapy, contrast bath, patient/family education, coping strategies training, DME and/or AE instructions, and Dry needling  CONSULTED AND AGREED WITH PLAN OF CARE: Patient  PLAN FOR NEXT SESSION:  ***   Fannie Knee, OTR/L, CHT 02/19/2023, 6:28 PM

## 2023-02-20 ENCOUNTER — Ambulatory Visit (INDEPENDENT_AMBULATORY_CARE_PROVIDER_SITE_OTHER): Payer: 59 | Admitting: Rehabilitative and Restorative Service Providers"

## 2023-02-20 ENCOUNTER — Encounter: Payer: Self-pay | Admitting: Rehabilitative and Restorative Service Providers"

## 2023-02-20 DIAGNOSIS — M6281 Muscle weakness (generalized): Secondary | ICD-10-CM | POA: Diagnosis not present

## 2023-02-20 DIAGNOSIS — M79642 Pain in left hand: Secondary | ICD-10-CM

## 2023-02-20 DIAGNOSIS — M25642 Stiffness of left hand, not elsewhere classified: Secondary | ICD-10-CM | POA: Diagnosis not present

## 2023-02-22 ENCOUNTER — Encounter: Payer: 59 | Admitting: Rehabilitative and Restorative Service Providers"

## 2023-02-28 ENCOUNTER — Other Ambulatory Visit: Payer: Self-pay

## 2023-02-28 DIAGNOSIS — F419 Anxiety disorder, unspecified: Secondary | ICD-10-CM

## 2023-03-01 ENCOUNTER — Encounter: Payer: 59 | Admitting: Rehabilitative and Restorative Service Providers"

## 2023-03-01 MED ORDER — ALPRAZOLAM 0.25 MG PO TABS: ORAL_TABLET | ORAL | 0 refills | Status: DC

## 2023-03-06 ENCOUNTER — Telehealth: Payer: Self-pay | Admitting: Internal Medicine

## 2023-03-06 ENCOUNTER — Other Ambulatory Visit: Payer: Self-pay | Admitting: Internal Medicine

## 2023-03-06 DIAGNOSIS — E559 Vitamin D deficiency, unspecified: Secondary | ICD-10-CM

## 2023-03-06 MED ORDER — VITAMIN D3 1.25 MG (50000 UT) PO TABS
50000.0000 [IU] | ORAL_TABLET | ORAL | 0 refills | Status: AC
Start: 2023-03-06 — End: 2023-04-11

## 2023-03-06 NOTE — Telephone Encounter (Signed)
Left mssg for Ms. Oelfke to inquire about how some of the medical issues we discussed at last visit were doing, and to let her know I was sending prescription for high dose vitamin D to her pharmacy, as her recent level was still low.

## 2023-03-13 ENCOUNTER — Encounter: Payer: 59 | Admitting: Rehabilitative and Restorative Service Providers"

## 2023-03-20 ENCOUNTER — Other Ambulatory Visit: Payer: Self-pay | Admitting: Internal Medicine

## 2023-03-20 ENCOUNTER — Encounter: Payer: Self-pay | Admitting: Internal Medicine

## 2023-03-20 DIAGNOSIS — I10 Essential (primary) hypertension: Secondary | ICD-10-CM

## 2023-03-20 MED ORDER — LOSARTAN POTASSIUM-HCTZ 50-12.5 MG PO TABS
1.0000 | ORAL_TABLET | Freq: Every day | ORAL | 2 refills | Status: DC
Start: 2023-03-20 — End: 2023-06-14

## 2023-04-03 ENCOUNTER — Other Ambulatory Visit: Payer: Self-pay

## 2023-04-03 DIAGNOSIS — F32A Depression, unspecified: Secondary | ICD-10-CM

## 2023-04-03 MED ORDER — ALPRAZOLAM 0.25 MG PO TABS
ORAL_TABLET | ORAL | 0 refills | Status: DC
Start: 2023-04-03 — End: 2023-05-24

## 2023-04-05 ENCOUNTER — Ambulatory Visit: Payer: 59 | Admitting: Family Medicine

## 2023-04-09 ENCOUNTER — Ambulatory Visit: Payer: 59 | Admitting: Family Medicine

## 2023-04-11 ENCOUNTER — Ambulatory Visit: Payer: 59 | Admitting: Family

## 2023-04-24 ENCOUNTER — Ambulatory Visit: Payer: 59 | Admitting: Family Medicine

## 2023-04-25 ENCOUNTER — Ambulatory Visit: Payer: 59 | Admitting: Family

## 2023-05-02 ENCOUNTER — Ambulatory Visit
Admission: RE | Admit: 2023-05-02 | Discharge: 2023-05-02 | Disposition: A | Discharge status: Home / Self Care | Payer: 59 | Source: Ambulatory Visit | Attending: Internal Medicine | Admitting: Internal Medicine

## 2023-05-02 VITALS — BP 121/81 | HR 96 | Temp 102.4°F | Resp 18 | Wt 200.0 lb

## 2023-05-02 DIAGNOSIS — B349 Viral infection, unspecified: Secondary | ICD-10-CM | POA: Diagnosis not present

## 2023-05-02 DIAGNOSIS — R509 Fever, unspecified: Secondary | ICD-10-CM | POA: Diagnosis not present

## 2023-05-02 LAB — POCT INFLUENZA A/B
Influenza A, POC: NEGATIVE
Influenza B, POC: NEGATIVE

## 2023-05-02 MED ORDER — ACETAMINOPHEN 325 MG PO TABS
650.0000 mg | ORAL_TABLET | Freq: Once | ORAL | Status: AC
  Administered 2023-05-02: 650 mg via ORAL

## 2023-05-02 NOTE — Discharge Instructions (Signed)
Your covid test is pending.  Return if symptoms worsen or change.  Your influenza test is negative  Take tylenol every 4 hours.  Drink plenty of fluids

## 2023-05-02 NOTE — ED Triage Notes (Addendum)
SX X 3-4 DAYS. Having chills, fever, cough aches and sneezing, sore throat. Fatigue and headache. Last tylenol last night.

## 2023-05-02 NOTE — ED Provider Notes (Addendum)
[name]    CSN: [id number] Arrival date & time: 05/02/23  1041      History   Chief Complaint Chief Complaint  Patient presents with   Fever    Having chills, fever, cough aches and sneezing. - Entered by patient   Cough   Headache   Sore Throat   Generalized Body Aches    HPI Melissa [name] is a 72 y.o. female.   Patient complains of a sore throat fever and a cough.  Patient reports symptoms started on Saturday.  Patient reports she has had a fever she took Tylenol last night for her fever and this helped her symptoms.  Patient denies exposure to anyone with flu or COVID.  Patient denies any difficulty breathing she is not having any chest pain  The history is provided by the patient. No language interpreter was used.  Fever Max temp prior to arrival:  102 Associated symptoms: cough and headaches   Cough Associated symptoms: fever and headaches   Headache Associated symptoms: cough and fever   Sore Throat Associated symptoms include headaches.    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   Plantar fasciitis of left foot 10/01/2013   PONV (postoperative nausea and vomiting)    Primary osteoarthritis of left knee 07/11/2021   Right-sided face pain 01/07/2019   Rotator cuff tear 02/02/2017   Seasonal allergies    Sleep apnea 2022   no cpap   Trigger middle finger of right hand 11/12/2018   Vitreous degeneration, unspecified eye  10/25/2012    Patient Active Problem List   Diagnosis Date Noted   Rosacea, unspecified 02/06/2023   Nontraumatic incomplete tear of left rotator cuff 12/12/2022   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   Chronic insomnia 10/12/2022   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   Decreased functional mobility 02/01/2022   Personal history of colonic polyps 01/26/2022   History of total knee replacement, bilateral 07/11/2021   Sleep apnea, intolerant of CPAP, declines 05/24/2021   Healthcare maintenance 01/07/2019   Chronic left shoulder pain 01/08/2018   History of bilateral hip replacements 07/19/2017   Acquired renal cyst of left kidney 01/01/2016   Spondylosis without myelopathy or radiculopathy, 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/2014   Obesity 11/13/2012   Glaucoma    Type 2 diabetes mellitus, controlled (HCC), without complications    Anxiety and depression    HTN (hypertension) 01/06/2012   GERD (gastroesophageal reflux disease) 01/06/2012   Hypercholesteremia 01/06/2012    Past Surgical History:  Procedure Laterality Date   ARTERY BIOPSY Right 01/23/2019   Procedure: REMOVAL OF PART OF RIGHT TEMPORAL ARTERY FOR BIOPSY;  Surgeon: Karie Soda, MD;  Location: [facility] OR;  Service: General;  Laterality: Right;   CATARACT EXTRACTION W/ INTRAOCULAR LENS IMPLANT Bilateral    COLONOSCOPY  04/2017   TA, diverticulosis  (  [facility])   GLAUCOMA SURGERY     HAMMER TOE SURGERY Bilateral    LAPAROSCOPIC SUPRACERVICAL HYSTERECTOMY  2007   with BSO, cervix remains.  Fibroids.   Right hip replacement     SHOULDER ARTHROSCOPY Left 12/12/2022   Procedure: LEFT SHOULDER ARTHROSCOPY WITH BALLOON SPACER;  Surgeon: Huel Cote, MD;  Location: [facility];  Service: Orthopedics;  Laterality: Left;   TONSILLECTOMY     TOTAL HIP ARTHROPLASTY Left 2021   TOTAL KNEE ARTHROPLASTY Right 2004   TKR (Dr. Renae Fickle with [facility])   TOTAL KNEE ARTHROPLASTY Left 07/11/2021   Procedure:  LEFT TOTAL KNEE ARTHROPLASTY;  Surgeon: Tarry Kos, MD;  Location: [facility] OR;  Service: Orthopedics;  Laterality: Left;   TOTAL KNEE REVISION Right 03/2014   Dr Lamar Sprinkles [facility]   TOTAL SHOULDER ARTHROPLASTY Right 2022    OB History     Gravida  0   Para      Term      Preterm      AB      Living  0      SAB      IAB      Ectopic      Multiple      Live Births               Home Medications    Prior to Admission medications   Medication Sig Start Date End Date Taking? Authorizing Provider  Accu-Chek FastClix Lancets MISC USE TO TEST BLOOD SUGAR UP TO FOUR TIMES DAILY 11/25/18   Dragnev, Alphonse Guild, NP  ACCU-CHEK GUIDE test strip USE AS DIRECTED FOUR TIMES DAILY 06/28/20   Corwin Levins, MD  ALPRAZolam Prudy Feeler) 0.25 MG tablet TAKE 1 TABLET(0.25 MG) BY MOUTH AT BEDTIME AS NEEDED FOR SLEEP 04/03/23   Miguel Aschoff, MD  aspirin EC 325 MG tablet Take 1 tablet (325 mg total) by mouth daily. 12/12/22   Huel Cote, MD  blood glucose meter kit and supplies KIT Dispense based on patient and insurance preference. Use up to four times daily as directed. DX Code: E11.9 02/11/18   Dragnev, Alphonse Guild, NP  Dulaglutide (TRULICITY) 1.5 MG/0.5ML SOPN Inject 1.5 mg into the skin once a week. 11/17/22   Miguel Aschoff, MD  escitalopram (LEXAPRO) 10 MG tablet TAKE 1 TABLET(10 MG) BY MOUTH DAILY 09/14/22   Miguel Aschoff, MD  esomeprazole (NEXIUM) 20 MG capsule Take 20 mg by mouth once a week.    [provider]  losartan-hydrochlorothiazide (HYZAAR) 50-12.5 MG tablet Take 1 tablet by mouth daily. 03/20/23   Miguel Aschoff, MD  metroNIDAZOLE (METROGEL) 1 % gel Apply topically daily. Apply to clean dry skin once daily 01/25/23   Miguel Aschoff, MD  ramelteon (ROZEREM) 8 MG tablet Take 1 tablet (8 mg total) by mouth at bedtime as needed for sleep. 01/25/23 01/25/24  Miguel Aschoff, MD  VYZULTA 0.024 % SOLN Place 1 drop into both eyes at bedtime.  05/16/21   [provider]    Family History Family History  Problem Relation Age of Onset   Cancer Maternal Grandmother 10       ovarian   Diabetes Maternal Grandmother    Hypertension Maternal Grandmother    Cancer Mother 44       bone, MM   Stroke Other        unsure who   CAD Neg Hx    Colon cancer Neg Hx  Esophageal cancer Neg Hx    Rectal cancer Neg Hx    Stomach cancer Neg Hx     Social History Social History   Tobacco Use   Smoking status: Never   Smokeless tobacco: Never  Vaping Use   Vaping status: Never Used  Substance Use Topics   Alcohol use: No   Drug use: No     Allergies   Codeine, Metformin and related, Oxycodone, and Latex   Review of Systems Review of Systems  Constitutional:  Positive for fever.  Respiratory:  Positive for cough.   Neurological:  Positive for headaches.  All other systems reviewed and are negative.    Physical Exam Triage Vital Signs ED Triage Vitals  Encounter Vitals Group     BP 05/02/23 1109 121/81     Systolic BP Percentile --      Diastolic BP Percentile --      Pulse Rate 05/02/23 1109 96     Resp 05/02/23 1109 18     Temp 05/02/23 1109 (!) 102.4 F (39.1 C)     Temp Source 05/02/23 1109 Oral     SpO2 05/02/23 1109 96 %     Weight 05/02/23 1108 200 lb (90.7 kg)     Height --      Head Circumference --      Peak Flow --      Pain Score 05/02/23 1108 8     Pain Loc --      Pain Education --      Exclude from Growth Chart --    No data found.  Updated Vital Signs BP 121/81 (BP Location: Right Arm)   Pulse 96   Temp (!) 102.4 F (39.1 C) (Oral)   Resp 18   Wt 90.7 kg   SpO2 96%   BMI 33.28 kg/m   Visual Acuity Right Eye Distance:   Left Eye Distance:   Bilateral Distance:    Right Eye Near:   Left Eye Near:    Bilateral Near:     Physical Exam Vitals and nursing note reviewed.  Constitutional:      Appearance: She is well-developed.  HENT:     Head: Normocephalic.   Cardiovascular:     Rate and Rhythm: Normal rate.  Pulmonary:     Effort: Pulmonary effort is normal.  Abdominal:     General: There is no distension.  Musculoskeletal:        General: Normal range of motion.     Cervical back: Normal range of motion.  Skin:    General: Skin is warm.  Neurological:     General: No focal deficit present.     Mental Status: She is alert and oriented to person, place, and time.      UC Treatments / Results  Labs (all labs ordered are listed, but only abnormal results are displayed) Labs Reviewed  POCT INFLUENZA A/B    EKG   Radiology No results found.  Procedures Procedures (including critical care time)  Medications Ordered in UC Medications  acetaminophen (TYLENOL) tablet 650 mg (650 mg Oral Given 05/02/23 1115)    Initial Impression / Assessment and Plan / UC Course  I have reviewed the triage vital signs and the nursing notes.  Pertinent labs & imaging results that were available during my care of the patient were reviewed by me and considered in my medical decision making (see chart for details).     Influenza testing is negative COVID is pending I  am very suspicious that patient has COVID.  I advised her Tylenol every 4 hours. Final Clinical Impressions(s) / UC Diagnoses   Final diagnoses:  Fever, unspecified  Viral illness     Discharge Instructions      Your covid test is pending.  Return if symptoms worsen or change.  Your influenza test is negative  Take tylenol every 4 hours.  Drink plenty of fluids    ED Prescriptions   None    PDMP not reviewed this encounter. An After Visit Summary was printed and given to the patient.       Elson Areas, PA-C 05/02/23 1207    Elson Areas, [place] 05/02/23 1435

## 2023-05-03 ENCOUNTER — Telehealth: Payer: Self-pay | Admitting: *Deleted

## 2023-05-03 ENCOUNTER — Encounter: Payer: Self-pay | Admitting: Internal Medicine

## 2023-05-03 ENCOUNTER — Other Ambulatory Visit: Payer: Self-pay | Admitting: Internal Medicine

## 2023-05-03 DIAGNOSIS — U071 COVID-19: Secondary | ICD-10-CM

## 2023-05-03 MED ORDER — PAXLOVID (300/100) 20 X 150 MG & 10 X 100MG PO TBPK
3.0000 | ORAL_TABLET | Freq: Two times a day (BID) | ORAL | 0 refills | Status: AC
Start: 2023-05-03 — End: 2023-05-08

## 2023-05-03 NOTE — Telephone Encounter (Signed)
Treating with Paxlovid empirically for Covid; symptoms evaluated in UC yesterday very consistent; Covid test was planned but doesn't seem to have been completed.

## 2023-05-03 NOTE — Telephone Encounter (Signed)
RTC to patient informed her that Dr. Mayford Knife has sent in a prescription for the Paxlovid to her Pharmacy.  Patient to go to pick up today.

## 2023-05-03 NOTE — Telephone Encounter (Signed)
RTC from patient went to Urgent Care on yesterday.  Was tested for the Flu and Covid per patient.  Neg Flu results.  Is awaiting results of Covid Test.  Is having a temperature around 102.  Taking Tylenol for the fever every 6 hours.  .  Coughing with chills and aching.  Is drinking water as well. l Awaiting Covid Test results.  Wants something for her symptoms. Realizes it is Viral.  Call to [facility] Covid Test was not done.  Call to patient informed her that a Covid Test was not done.  Patient to go to A Pharmacy or return to the Urgent Care to have a Covid Test done as soon as possible.

## 2023-05-03 NOTE — Telephone Encounter (Signed)
I called pt - no answer; left message to call the office.

## 2023-05-04 ENCOUNTER — Encounter: Payer: Self-pay | Admitting: Internal Medicine

## 2023-05-04 DIAGNOSIS — U071 COVID-19: Secondary | ICD-10-CM | POA: Insufficient documentation

## 2023-05-04 HISTORY — DX: COVID-19: U07.1

## 2023-05-08 ENCOUNTER — Encounter: Payer: Self-pay | Admitting: Internal Medicine

## 2023-05-16 ENCOUNTER — Other Ambulatory Visit (HOSPITAL_BASED_OUTPATIENT_CLINIC_OR_DEPARTMENT_OTHER): Payer: Self-pay

## 2023-05-16 ENCOUNTER — Ambulatory Visit: Payer: 59 | Admitting: Family Medicine

## 2023-05-16 MED ORDER — COMIRNATY 30 MCG/0.3ML IM SUSY
0.3000 mL | PREFILLED_SYRINGE | Freq: Once | INTRAMUSCULAR | 0 refills | Status: AC
  Filled 2023-05-16: qty 0.3, 1d supply, fill #0

## 2023-05-24 ENCOUNTER — Other Ambulatory Visit: Payer: Self-pay

## 2023-05-24 DIAGNOSIS — F419 Anxiety disorder, unspecified: Secondary | ICD-10-CM

## 2023-05-24 MED ORDER — ALPRAZOLAM 0.25 MG PO TABS
ORAL_TABLET | ORAL | 0 refills | Status: DC
Start: 2023-05-24 — End: 2023-07-04

## 2023-06-14 ENCOUNTER — Other Ambulatory Visit: Payer: Self-pay

## 2023-06-14 DIAGNOSIS — I10 Essential (primary) hypertension: Secondary | ICD-10-CM

## 2023-06-19 MED ORDER — LOSARTAN POTASSIUM-HCTZ 50-12.5 MG PO TABS
1.0000 | ORAL_TABLET | Freq: Every day | ORAL | 3 refills | Status: DC
Start: 2023-06-19 — End: 2023-11-29

## 2023-06-22 ENCOUNTER — Other Ambulatory Visit: Payer: Self-pay

## 2023-06-22 ENCOUNTER — Ambulatory Visit (INDEPENDENT_AMBULATORY_CARE_PROVIDER_SITE_OTHER): Payer: 59 | Admitting: Allergy

## 2023-06-22 ENCOUNTER — Encounter: Payer: Self-pay | Admitting: Allergy

## 2023-06-22 VITALS — BP 116/84 | HR 68 | Temp 98.7°F | Resp 16 | Ht 65.25 in | Wt 192.3 lb

## 2023-06-22 DIAGNOSIS — H1013 Acute atopic conjunctivitis, bilateral: Secondary | ICD-10-CM | POA: Diagnosis not present

## 2023-06-22 DIAGNOSIS — L231 Allergic contact dermatitis due to adhesives: Secondary | ICD-10-CM | POA: Diagnosis not present

## 2023-06-22 DIAGNOSIS — J3089 Other allergic rhinitis: Secondary | ICD-10-CM | POA: Diagnosis not present

## 2023-06-22 MED ORDER — RYALTRIS 665-25 MCG/ACT NA SUSP: 2.0000 | Freq: Every day | NASAL | 5 refills | Status: DC | PRN

## 2023-06-22 MED ORDER — EUCRISA 2 % EX OINT: 1.0000 | TOPICAL_OINTMENT | Freq: Two times a day (BID) | CUTANEOUS | 5 refills | Status: DC | PRN

## 2023-06-22 NOTE — Progress Notes (Signed)
Follow-up Note  RE: Melissa [name] MRN: [id number] DOB: 1951/09/04 Date of Office Visit: 06/22/2023   History of present illness: Melissa [name] is a 72 y.o. female presenting today for follow-up of allergic rhinitis with conjunctivitis and contact dermatitis.  She was last seen in the office on 12/22/2021 by myself.  Discussed the use of AI scribe software for clinical note transcription with the patient, who gave verbal consent to proceed.  She reports persistent dermatological issues, specifically itching and burning sensations in the right shoulder/arm are, which she attributes to a previous exposure to Dermabond. She had a shoulder replacement several years ago where Dermabond was used as a Statistician and she had a pretty severe contact dermatitis reaction related to the Dermabond use.  She reports that the symptoms tend to flare up and then subside, but have been progressively worsening. She used Eucrisa cream last year after I provided samples to the shoulder area and it worked very well for the symptoms and the rash she was having but she ran out, which she reports has been effective in managing the symptoms.   In addition to the dermatological issues, the patient also reports experiencing seasonal allergies, which she manages as needed with Allegra. She finds this medication to be effective in controlling her symptoms, which include itchy ears. She also mentions using a nasal spray, Ryaltris, for stuffy or runny nose symptoms, which she received through the mail.  Review of systems: 10pt ROS negative unless noted above in HPI  All other systems negative unless noted above in HPI  Past medical/social/surgical/family history have been reviewed and are unchanged unless specifically indicated below.  No changes  Medication List: Current Outpatient Medications  Medication Sig Dispense Refill   Accu-Chek FastClix Lancets MISC USE TO TEST BLOOD SUGAR UP TO FOUR TIMES DAILY  306 each 1   ACCU-CHEK GUIDE test strip USE AS DIRECTED FOUR TIMES DAILY 100 strip 11   ALPRAZolam (XANAX) 0.25 MG tablet TAKE 1 TABLET(0.25 MG) BY MOUTH AT BEDTIME AS NEEDED FOR SLEEP 30 tablet 0   blood glucose meter kit and supplies KIT Dispense based on patient and insurance preference. Use up to four times daily as directed. DX Code: E11.9 1 each 0   Crisaborole (EUCRISA) 2 % OINT Apply 1 Application topically 2 (two) times daily as needed (Itchy rash). 100 g 5   Dulaglutide (TRULICITY) 1.5 MG/0.5ML SOPN Inject 1.5 mg into the skin once a week. 6 mL 3   escitalopram (LEXAPRO) 10 MG tablet TAKE 1 TABLET(10 MG) BY MOUTH DAILY 90 tablet 3   esomeprazole (NEXIUM) 20 MG capsule Take 20 mg by mouth once a week.     losartan-hydrochlorothiazide (HYZAAR) 50-12.5 MG tablet Take 1 tablet by mouth daily. 90 tablet 3   RYALTRIS 665-25 MCG/ACT SUSP Place 2 sprays into the nose daily as needed. 29 g 5   VYZULTA 0.024 % SOLN Place 1 drop into both eyes at bedtime.     aspirin EC 325 MG tablet Take 1 tablet (325 mg total) by mouth daily. 14 tablet 0   metroNIDAZOLE (METROGEL) 1 % gel Apply topically daily. Apply to clean dry skin once daily 45 g 0   ramelteon (ROZEREM) 8 MG tablet Take 1 tablet (8 mg total) by mouth at bedtime as needed for sleep. (Patient not taking: Reported on 06/22/2023) 30 tablet 3   No current facility-administered medications for this visit.     Known medication allergies: Allergies  Allergen Reactions  Codeine Other (See Comments)    palpitations   Metformin And Related Nausea Only   Oxycodone Nausea And Vomiting   Latex Dermatitis, Itching and Rash     Physical examination: Blood pressure 116/84, pulse 68, temperature 98.7 F (37.1 C), temperature source Temporal, resp. rate 16, height 5' 5.25" (1.657 m), weight 192 lb 4.8 oz (87.2 kg), SpO2 99%.  General: Alert, interactive, in no acute distress. HEENT: PERRLA, TMs pearly gray, turbinates non-edematous without  discharge, post-pharynx non erythematous. Neck: Supple without lymphadenopathy. Lungs: Clear to auscultation without wheezing, rhonchi or rales. {no increased work of breathing. CV: Normal S1, S2 without murmurs. Abdomen: Nondistended, nontender. Skin: Healed horizontal scar on the anterior right shoulder, slightly hyperpigmented patch on the top of the shoulder, rounds hyperpigmented with a bruised appearance patch on the anterior shoulder below the scar, there is patches of erythema and some papules on the right shoulder and forearm . Extremities:  No clubbing, cyanosis or edema. Neuro:   Grossly intact.  Diagnositics/Labs: None today  Assessment and plan: Allergic rhinitis with conjunctivitis - continue avoidance measures for grasses, weeds, trees, mold and cat - did not tolerate Xyzal use - continue Allegra 180mg  daily as needed - for nasal congestion/drainage use Ryaltris 2 sprays each nostril daily as needed.  This has a nasal steroid component for nasal congestion control and a nasal antihistamine component for nasal drainage - for itchy/watery eyes can use Pataday 1 drop each eye daily as needed  Contact dermatitis - you have contact allergy to use of dermabond - would not use this product again.  If needing a future surgery will need to use a different closure device.  - use eucrisa ointment twice a day for itching, redness, rash until resolved.   Follow-up in 6-12 months or sooner if needed   I appreciate the opportunity to take part in Ketina's care. Please do not hesitate to contact me with questions.  Sincerely,   Margo Aye, MD Allergy/Immunology [facility]

## 2023-06-22 NOTE — Patient Instructions (Signed)
Allergic rhinitis with conjunctivitis - continue avoidance measures for grasses, weeds, trees, mold and cat - did not tolerate Xyzal use - continue Allegra 180mg  daily as needed - for nasal congestion/drainage use Ryaltris 2 sprays each nostril daily as needed.  This has a nasal steroid component for nasal congestion control and a nasal antihistamine component for nasal drainage - for itchy/watery eyes can use Pataday 1 drop each eye daily as needed  Contact dermatitis - you have contact allergy to use of dermabond - would not use this product again.  If needing a future surgery will need to use a different closure device.  - use eucrisa ointment twice a day for itching, redness, rash until resolved.    Will call you when letter is ready for pick-up. Follow-up in 6-12 months or sooner if needed

## 2023-06-27 ENCOUNTER — Ambulatory Visit (HOSPITAL_BASED_OUTPATIENT_CLINIC_OR_DEPARTMENT_OTHER): Payer: 59 | Admitting: Orthopaedic Surgery

## 2023-06-27 DIAGNOSIS — M75112 Incomplete rotator cuff tear or rupture of left shoulder, not specified as traumatic: Secondary | ICD-10-CM | POA: Diagnosis not present

## 2023-06-27 NOTE — Progress Notes (Signed)
Post Operative Evaluation    Procedure/Date of Surgery: Left subacromial balloon spacer 4/9  Interval History:    Presents today for follow-up of her left shoulder subacromial spacer.  Overall she is doing very well from a left shoulder perspective.  She is continuing to work with physical therapy on the right shoulder as well.   PMH/PSH/Family History/Social History/Meds/Allergies:    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   Plantar fasciitis of left foot 10/01/2013   PONV (postoperative nausea and vomiting)    Primary osteoarthritis of left knee 07/11/2021   Right-sided face pain 01/07/2019   Rotator cuff tear 02/02/2017   Seasonal allergies    Sleep apnea 2022   no cpap   Trigger middle finger of right hand 11/12/2018   Vitreous degeneration, unspecified eye 10/25/2012   Past Surgical History:  Procedure Laterality Date   ARTERY BIOPSY Right 01/23/2019   Procedure: REMOVAL OF PART OF RIGHT TEMPORAL ARTERY FOR BIOPSY;  Surgeon: Karie Soda, MD;  Location: [facility] OR;  Service: General;  Laterality: Right;   CATARACT EXTRACTION W/ INTRAOCULAR LENS IMPLANT Bilateral    COLONOSCOPY  04/2017   TA, diverticulosis  ([facility])   GLAUCOMA SURGERY     HAMMER TOE SURGERY Bilateral    LAPAROSCOPIC SUPRACERVICAL HYSTERECTOMY  2007   with BSO, cervix remains.  Fibroids.   Right hip replacement     SHOULDER ARTHROSCOPY Left 12/12/2022    Procedure: LEFT SHOULDER ARTHROSCOPY WITH BALLOON SPACER;  Surgeon: Huel Cote, MD;  Location: [facility];  Service: Orthopedics;  Laterality: Left;   TONSILLECTOMY     TOTAL HIP ARTHROPLASTY Left 2021   TOTAL KNEE ARTHROPLASTY Right 2004   TKR (Dr. Renae Fickle with [facility])   TOTAL KNEE ARTHROPLASTY Left 07/11/2021   Procedure: LEFT TOTAL KNEE ARTHROPLASTY;  Surgeon: Tarry Kos, MD;  Location: [facility];  Service: Orthopedics;  Laterality: Left;   TOTAL KNEE REVISION Right 03/2014   Dr Lamar Sprinkles [facility]   TOTAL SHOULDER ARTHROPLASTY Right 2022   Social History   Socioeconomic History   Marital status: Single    Spouse name: Not on file   Number of children: 0   Years of education: 16   Highest education level: Not on file  Occupational History   Occupation: work with disabilities/ Retired    Associate Professor: [employer]  Tobacco Use   Smoking status: Never    Passive exposure: Past   Smokeless tobacco: Never  Vaping Use   Vaping status: Never Used  Substance and Sexual Activity   Alcohol use: No   Drug use: No   Sexual activity: Not Currently    Birth control/protection: Surgical  Other Topics Concern   Not on file  Social History Narrative   Caffeine: rare   Lives alone   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[employer]   Fun/Hobbies: Gardening    Social Determinants of Health   Financial Resource Strain: Medium Risk (01/25/2023)   Overall Financial Resource Strain (CARDIA)    Difficulty of Paying Living Expenses: Somewhat hard  Food Insecurity: No Food Insecurity (01/25/2023)   Hunger Vital Sign    Worried About Running Out of Food in the Last Year: Never true    Ran Out of Food in the Last Year: Never true  Transportation Needs: No Transportation Needs (01/25/2023)   PRAPARE - Administrator, Civil Service (Medical): No    Lack of Transportation (Non-Medical): No  Physical Activity: Insufficiently Active (01/25/2023)   Exercise Vital  Sign    Days of Exercise per Week: 2 days    Minutes of Exercise per Session: 40 min  Stress: Stress Concern Present (01/25/2023)   [employer] of Occupational Health - Occupational Stress Questionnaire    Feeling of Stress : Rather much  Social Connections: Unknown (01/25/2023)   Social Connection and Isolation Panel [NHANES]    Frequency of Communication with Friends and Family: Once a week    Frequency of Social Gatherings with Friends and Family: Not on file    Attends Religious Services: Not on file    Active Member of Clubs or Organizations: Not on file    Attends Engineer, structural: More than 4 times per year    Marital Status: Never married   Family History  Problem Relation Age of Onset   Cancer Maternal Grandmother 13       ovarian   Diabetes Maternal Grandmother    Hypertension Maternal Grandmother    Cancer Mother 26       bone, MM   Stroke Other        unsure who   CAD Neg Hx    Colon cancer Neg Hx    Esophageal cancer Neg Hx    Rectal cancer Neg Hx    Stomach cancer Neg Hx    Allergies  Allergen Reactions   Codeine Other (See Comments)    palpitations   Metformin And Related Nausea Only   Oxycodone Nausea And Vomiting   Latex Dermatitis, Itching and Rash   Current Outpatient Medications  Medication Sig Dispense Refill   Accu-Chek FastClix Lancets MISC USE TO TEST BLOOD SUGAR UP TO FOUR TIMES DAILY 306 each 1   ACCU-CHEK GUIDE test strip USE AS DIRECTED FOUR TIMES DAILY 100 strip 11   ALPRAZolam (XANAX) 0.25 MG tablet TAKE 1 TABLET(0.25 MG) BY MOUTH AT BEDTIME AS NEEDED FOR SLEEP 30 tablet 0   blood glucose meter kit and supplies KIT Dispense based on patient and insurance preference. Use up to four times daily as directed. DX Code: E11.9 1 each 0   Crisaborole (EUCRISA) 2 % OINT Apply 1 Application topically 2 (two) times daily as needed (Itchy rash). 100 g 5   Dulaglutide (TRULICITY) 1.5 MG/0.5ML SOPN Inject 1.5 mg into the skin once a week.  6 mL 3   escitalopram (LEXAPRO) 10 MG tablet TAKE 1 TABLET(10 MG) BY MOUTH DAILY 90 tablet 3   esomeprazole (NEXIUM) 20 MG capsule Take 20 mg by mouth once a week.     losartan-hydrochlorothiazide (HYZAAR) 50-12.5 MG tablet Take 1 tablet by mouth daily. 90 tablet 3   ramelteon (ROZEREM) 8 MG tablet  Take 1 tablet (8 mg total) by mouth at bedtime as needed for sleep. (Patient not taking: Reported on 06/22/2023) 30 tablet 3   RYALTRIS 665-25 MCG/ACT SUSP Place 2 sprays into the nose daily as needed. 29 g 5   VYZULTA 0.024 % SOLN Place 1 drop into both eyes at bedtime.     No current facility-administered medications for this visit.   No results found.  Review of Systems:   A ROS was performed including pertinent positives and negatives as documented in the HPI.   Musculoskeletal Exam:    There were no vitals taken for this visit.  Left shoulder with well-healed.  Active forward elevation is to 140 degrees with external rotation at the side to 50 degrees.  Internal rotation is to L3.  Strength is much improved  Imaging:      I personally reviewed and interpreted the radiographs.   Assessment:   Status post left shoulder subacromial balloon spacer overall doing much better.  At today's visit she has no pain with forward elevation.  She is continuing to work on right shoulder range of motion.  Plan to see her back as needed  Plan :    -Return to clinic as needed      I personally saw and evaluated the patient, and participated in the management and treatment plan.  Huel Cote, MD Attending Physician, Orthopedic Surgery  This document was dictated using Dragon voice recognition software. A reasonable attempt at proof reading has been made to minimize errors.

## 2023-06-28 ENCOUNTER — Telehealth: Payer: Self-pay | Admitting: Allergy

## 2023-06-28 ENCOUNTER — Telehealth: Payer: Self-pay

## 2023-06-28 NOTE — Telephone Encounter (Signed)
*  Asthma/Allergy  Pharmacy Patient Advocate Encounter   Received notification from CoverMyMeds that prior authorization for Eucrisa 2% ointment  is required/requested.   Insurance verification completed.   The patient is insured through [facility] .   Per test claim: PA required; PA submitted to [facility] via CoverMyMeds Key/confirmation #/EOC [id number] Status is pending

## 2023-06-28 NOTE — Telephone Encounter (Signed)
Patient called stating she was checking to see if a letter was ready for pick up. Patient states Dr. Delorse Lek is suppose to be writing a confidential letter about her health of her right shoulder.

## 2023-06-28 NOTE — Telephone Encounter (Signed)
Pts insurance denied the pa for eucrisa before they approve it pt has to fail  four (4) of these covered drugs: (a) Ala-Cort, hydrocortisone cream, hydrocortisone butyrate ointment, or hydrocortisone valeratecream. (b) Alclometasone. (c) Betamethasone dipropionate cream, betamethasone dipropionate augmented cream, or betamethasone valerate ointment. (d) Clobetasol solution or clobetasol emollient cream. (e) Cordran tape. (f) Desoximetasone cream. (g) Fluocinolone body oil. (h) Fluocinonide solution. (i) Fluticasone ointment. (j) Halobetasol cream. (k) Mometasone ointment. (l) Pimecrolimus cream*. (m) Tacrolimus ointment*. (n) Triamcinolone cream or Triderm.

## 2023-06-28 NOTE — Telephone Encounter (Signed)
Pharmacy Patient Advocate Encounter  Received notification from [facility] that Prior Authorization for Eucrisa 2% ointment  has been DENIED.  See denial reason below. No denial letter attached in CMM. Will attache denial letter to Media tab once received.   PA #/Case ID/Reference #: Decision Notes: EUCRISA OIN 2% is denied because it is not on your plan's Drug List (formulary). Medication authorization requires the following: (1) You need to try four (4) of these covered drugs: (a) Ala-Cort, hydrocortisone cream, hydrocortisone butyrate ointment, or hydrocortisone valeratecream. (b) Alclometasone. (c) Betamethasone dipropionate cream, betamethasone dipropionate augmented cream, or betamethasone valerate ointment. (d) Clobetasol solution or clobetasol emollient cream. (e) Cordran tape. (f) Desoximetasone cream. (g) Fluocinolone body oil. (h) Fluocinonide solution. (i) Fluticasone ointment. (j) Halobetasol cream. (k) Mometasone ointment. (l) Pimecrolimus cream*. (m) Tacrolimus ointment*. (n) Triamcinolone cream or Triderm. (2) OR your doctor needs to give [place] specific medical reasons why four (4) of the covered drug(s) are not appropriate for you

## 2023-07-02 NOTE — Telephone Encounter (Signed)
Patient called and stated she was checking to see if a letter was ready for pick up from Dr Delorse Lek. Patient stated that Dr Delorse Lek knows what it is about.

## 2023-07-03 NOTE — Telephone Encounter (Signed)
Letter has been printed and placed in Dr. Randell Patient office for her to review and sign tomorrow.

## 2023-07-04 ENCOUNTER — Other Ambulatory Visit: Payer: Self-pay

## 2023-07-04 DIAGNOSIS — F419 Anxiety disorder, unspecified: Secondary | ICD-10-CM

## 2023-07-04 NOTE — Telephone Encounter (Signed)
Left patient a message letting her know the letter is ready for pick up.

## 2023-07-06 ENCOUNTER — Other Ambulatory Visit: Payer: Self-pay

## 2023-07-06 DIAGNOSIS — E119 Type 2 diabetes mellitus without complications: Secondary | ICD-10-CM

## 2023-07-06 MED ORDER — TRULICITY 1.5 MG/0.5ML ~~LOC~~ SOAJ: 1.5000 mg | SUBCUTANEOUS | 3 refills | Status: DC

## 2023-07-06 MED ORDER — ALPRAZOLAM 0.25 MG PO TABS
ORAL_TABLET | ORAL | 0 refills | Status: DC
Start: 2023-07-06 — End: 2023-08-15

## 2023-07-09 ENCOUNTER — Telehealth: Payer: Self-pay | Admitting: Orthopaedic Surgery

## 2023-07-09 NOTE — Telephone Encounter (Signed)
Patient called asked if she can get a call when the letter is ready for pick up. The number to contact patient is [phone]

## 2023-07-09 NOTE — Telephone Encounter (Signed)
LMOM with Dr. Eddie Dibbles response

## 2023-07-11 ENCOUNTER — Ambulatory Visit: Payer: 59 | Admitting: Allergy

## 2023-07-12 ENCOUNTER — Ambulatory Visit: Payer: 59 | Admitting: Orthopaedic Surgery

## 2023-07-18 ENCOUNTER — Ambulatory Visit (INDEPENDENT_AMBULATORY_CARE_PROVIDER_SITE_OTHER): Payer: 59 | Admitting: Orthopaedic Surgery

## 2023-07-18 ENCOUNTER — Encounter: Payer: Self-pay | Admitting: Orthopaedic Surgery

## 2023-07-18 ENCOUNTER — Other Ambulatory Visit (INDEPENDENT_AMBULATORY_CARE_PROVIDER_SITE_OTHER): Payer: 59

## 2023-07-18 DIAGNOSIS — M25561 Pain in right knee: Secondary | ICD-10-CM

## 2023-07-18 DIAGNOSIS — G8929 Other chronic pain: Secondary | ICD-10-CM

## 2023-07-18 MED ORDER — MELOXICAM 7.5 MG PO TABS: 7.5000 mg | ORAL_TABLET | Freq: Two times a day (BID) | ORAL | 2 refills | Status: DC | PRN

## 2023-07-18 NOTE — Progress Notes (Signed)
Office Visit Note   Patient: Melissa [name]           Date of Birth: Jan 17, 1951           MRN: [id number] Visit Date: 07/18/2023              Requested by: Miguel Aschoff, MD [address] [place],  [place] [address] PCP: Miguel Aschoff, MD   Assessment & Plan: Visit Diagnoses:  1. Chronic pain of right knee     Plan: Impression is 72 year old female with right knee contusion.  I recommend Voltaren gel and a course of physical therapy and I will prescribe meloxicam to try for couple weeks.  Follow-up if symptoms do not improve.  Follow-Up Instructions: No follow-ups on file.   Orders:  Orders Placed This Encounter  Procedures   XR KNEE 3 VIEW RIGHT   Ambulatory referral to Physical Therapy   Meds ordered this encounter  Medications   meloxicam (MOBIC) 7.5 MG tablet    Sig: Take 1 tablet (7.5 mg total) by mouth 2 (two) times daily as needed for pain.    Dispense:  30 tablet    Refill:  2      Procedures: No procedures performed   Clinical Data: No additional findings.   Subjective: Chief Complaint  Patient presents with   Right Knee - Pain    HPI Melissa [name] is a 72 year old female who comes in for right knee pain.  States she fell in June and went to the urgent care and was prescribed oral prednisone which helped but recently has been feeling worse.  She feels pain around the infrapatellar region.  She is able to weight-bear without any problems. Review of Systems  Constitutional: Negative.   HENT: Negative.    Eyes: Negative.   Respiratory: Negative.    Cardiovascular: Negative.   Endocrine: Negative.   Musculoskeletal: Negative.   Neurological: Negative.   Hematological: Negative.   Psychiatric/Behavioral: Negative.    All other systems reviewed and are negative.    Objective: Vital Signs: There were no vitals taken for this visit.  Physical Exam Vitals and nursing note reviewed.  Constitutional:      Appearance: She is  well-developed.  HENT:     Head: Normocephalic and atraumatic.  Pulmonary:     Effort: Pulmonary effort is normal.  Abdominal:     Palpations: Abdomen is soft.  Musculoskeletal:     Cervical back: Neck supple.  Skin:    General: Skin is warm.     Capillary Refill: Capillary refill takes less than 2 seconds.  Neurological:     Mental Status: She is alert and oriented to person, place, and time.  Psychiatric:        Behavior: Behavior normal.        Thought Content: Thought content normal.        Judgment: Judgment normal.     Ortho Exam Exam of the right knee shows a fully healed prior surgical scar.  Range of motion is fluid and for the most part painless.  She feels a little anterior knee pain with extremes of flexion.  Extensor mechanism is normal.  Collaterals are stable.  Negative anterior and posterior drawer sign.  No joint effusion.  No signs of infection. Specialty Comments:  No specialty comments available.  Imaging: XR KNEE 3 VIEW RIGHT  Result Date: 07/18/2023 X-rays of the right knee show a right total knee replacement with a cruciate retaining femoral component.  No evidence of subsidence or loosening.    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   Decreased functional mobility 02/01/2022   History of colonic polyps 01/26/2022   History of total knee replacement, bilateral 07/11/2021   Sleep apnea, intolerant of CPAP, declines 05/24/2021   Healthcare maintenance 01/07/2019   Chronic left shoulder pain 01/08/2018   History of bilateral hip replacements 07/19/2017   Acquired renal cyst of left kidney 01/01/2016   Spondylosis without myelopathy or radiculopathy, 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/2014    Obesity 11/13/2012   Glaucoma    Type 2 diabetes mellitus, controlled (HCC), without complications    Anxiety and depression    HTN (hypertension) 01/06/2012   GERD (gastroesophageal reflux disease) 01/06/2012   Hypercholesteremia 01/06/2012   Past Medical History:  Diagnosis Date   Allergy    Anxiety    Arthritis    s/p R TKR   Cyst of left kidney 2015   by lumbar MRI pending renal US   DDD (degenerative disc disease), lumbar 03/2014   severe L2-3 with L HNP with L2/3 nerve root impingement Roetta [name] @ [facility])   Depression with anxiety    Diabetes type 2, controlled (HCC) 2011   borderline   Glaucoma    History of ulcer disease    HLD (hyperlipidemia)    no meds taken   Hypertension    OAB (overactive bladder) 10/14/2019   Osteoarthritis of spine with radiculopathy, 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   Plantar fasciitis of left foot 10/01/2013   PONV (postoperative nausea and vomiting)    Primary osteoarthritis of left knee 07/11/2021   Right-sided face pain 01/07/2019   Rotator cuff tear 02/02/2017   Seasonal allergies    Sleep apnea 2022   no cpap   Trigger middle finger of right hand 11/12/2018   Vitreous degeneration, unspecified eye 10/25/2012    Family History  Problem Relation Age of Onset   Cancer Maternal Grandmother 81       ovarian   Diabetes Maternal Grandmother    Hypertension Maternal Grandmother    Cancer Mother 55       bone, MM   Stroke Other        unsure who   CAD Neg Hx    Colon cancer Neg Hx    Esophageal cancer Neg Hx    Rectal cancer Neg Hx    Stomach cancer Neg Hx     Past Surgical History:  Procedure Laterality Date   ARTERY BIOPSY Right 01/23/2019   Procedure: REMOVAL OF PART OF RIGHT TEMPORAL ARTERY FOR BIOPSY;  Surgeon: Karie Soda, MD;  Location: [facility] OR;  Service: General;  Laterality: Right;   CATARACT EXTRACTION W/ INTRAOCULAR LENS IMPLANT Bilateral    COLONOSCOPY  04/2017   TA, diverticulosis  ([facility])   GLAUCOMA SURGERY     HAMMER TOE  SURGERY Bilateral    LAPAROSCOPIC SUPRACERVICAL HYSTERECTOMY  2007   with BSO, cervix remains.  Fibroids.   Right hip replacement     SHOULDER ARTHROSCOPY Left 12/12/2022   Procedure: LEFT SHOULDER ARTHROSCOPY WITH BALLOON SPACER;  Surgeon: Huel Cote, MD;  Location: [facility];  Service: Orthopedics;  Laterality: Left;   TONSILLECTOMY     TOTAL HIP ARTHROPLASTY Left 2021  TOTAL KNEE ARTHROPLASTY Right 2004   TKR (Dr. Renae Fickle with [facility])   TOTAL KNEE ARTHROPLASTY Left 07/11/2021   Procedure: LEFT TOTAL KNEE ARTHROPLASTY;  Surgeon: Tarry Kos, MD;  Location: [facility];  Service: Orthopedics;  Laterality: Left;   TOTAL KNEE REVISION Right 03/2014   Dr Lamar Sprinkles [facility]   TOTAL SHOULDER ARTHROPLASTY Right 2022   Social History   Occupational History   Occupation: work with disabilities/ Retired    Associate Professor: [employer]  Tobacco Use   Smoking status: Never    Passive exposure: Past   Smokeless tobacco: Never  Vaping Use   Vaping status: Never Used  Substance and Sexual Activity   Alcohol use: No   Drug use: No   Sexual activity: Not Currently    Birth control/protection: Surgical

## 2023-07-22 ENCOUNTER — Encounter: Payer: Self-pay | Admitting: Allergy

## 2023-07-24 ENCOUNTER — Other Ambulatory Visit: Payer: Self-pay

## 2023-07-24 MED ORDER — PIMECROLIMUS 1 % EX CREA: TOPICAL_CREAM | Freq: Two times a day (BID) | CUTANEOUS | 1 refills | Status: DC

## 2023-07-25 ENCOUNTER — Ambulatory Visit (HOSPITAL_BASED_OUTPATIENT_CLINIC_OR_DEPARTMENT_OTHER): Payer: 59 | Admitting: Orthopaedic Surgery

## 2023-07-25 ENCOUNTER — Telehealth: Payer: Self-pay

## 2023-07-25 ENCOUNTER — Encounter (HOSPITAL_BASED_OUTPATIENT_CLINIC_OR_DEPARTMENT_OTHER): Payer: Self-pay

## 2023-07-25 ENCOUNTER — Other Ambulatory Visit (HOSPITAL_COMMUNITY): Payer: Self-pay

## 2023-07-25 NOTE — Telephone Encounter (Signed)
*  Asthma/Allergy  Pharmacy Patient Advocate Encounter   Received notification from CoverMyMeds that prior authorization for Pimecrolimus 1% cream  is required/requested.   Insurance verification completed.   The patient is insured through [facility] .   Per test claim: PA required; PA started via CoverMyMeds. KEY [id number] . Please see clinical question(s) below that I am not finding the answer to in her chart and advise.   Does the patient have a history of failure or intolerance to ANY TWO of the following formulary topical agents: (1) Desonide ointment, (2) Ala-Cort 2.5% or hydrocortisone 2.5% cream, (3) Hydrocortisone 2.5% ointment, (4) Generic augmented betamethasone 0.05%, (5) Fluocinonide 0.05%?

## 2023-07-26 NOTE — Telephone Encounter (Signed)
There is no documentation of the patient trying or failing Desonide, Ala-Cort, Hydrocortisone 2.5%, Betamethasone 0.05, or Fluocinonide 0.05%, would it be ok to switch to any of these medications from Pimecrolimus?

## 2023-07-27 MED ORDER — DESONIDE 0.05 % EX OINT: 1.0000 | TOPICAL_OINTMENT | Freq: Two times a day (BID) | CUTANEOUS | 5 refills | Status: DC | PRN

## 2023-07-27 NOTE — Telephone Encounter (Signed)
Alternative medication has been sent to the pharmacy on file. Patient has been called and notified of change.

## 2023-08-09 ENCOUNTER — Ambulatory Visit: Payer: 59 | Admitting: Physical Therapy

## 2023-08-13 ENCOUNTER — Encounter: Payer: Self-pay | Admitting: Student

## 2023-08-13 ENCOUNTER — Ambulatory Visit: Payer: 59 | Admitting: Student

## 2023-08-13 ENCOUNTER — Other Ambulatory Visit: Payer: Self-pay

## 2023-08-13 VITALS — BP 129/79 | HR 80 | Temp 98.3°F | Ht 65.25 in | Wt 194.3 lb

## 2023-08-13 DIAGNOSIS — E119 Type 2 diabetes mellitus without complications: Secondary | ICD-10-CM | POA: Diagnosis not present

## 2023-08-13 DIAGNOSIS — Z794 Long term (current) use of insulin: Secondary | ICD-10-CM

## 2023-08-13 DIAGNOSIS — E559 Vitamin D deficiency, unspecified: Secondary | ICD-10-CM | POA: Diagnosis not present

## 2023-08-13 DIAGNOSIS — I10 Essential (primary) hypertension: Secondary | ICD-10-CM | POA: Diagnosis not present

## 2023-08-13 DIAGNOSIS — F32A Depression, unspecified: Secondary | ICD-10-CM

## 2023-08-13 LAB — POCT GLYCOSYLATED HEMOGLOBIN (HGB A1C): Hemoglobin A1C: 6 % — AB (ref 4.0–5.6)

## 2023-08-13 LAB — GLUCOSE, CAPILLARY: Glucose-Capillary: 140 mg/dL — ABNORMAL HIGH (ref 70–99)

## 2023-08-13 NOTE — Assessment & Plan Note (Signed)
History of type 2 diabetes.  Hemoglobin A1c 6 months ago was 5.6.  Point-of-care A1c in the office today is 6, on Trulicity 1.5 mg. -Continue Trulicity - Recheck hemoglobin A1c in 3 to 6 months

## 2023-08-13 NOTE — Assessment & Plan Note (Signed)
History of hypertension.  Blood pressure in office today is 129/79.On Hyzaar 50-12.5.  Patient endorses compliance to her antihypertensive medication and her blood pressure seems to be well controlled.  I will get a basic metabolic panel to assess her kidney function. - Recheck BMP

## 2023-08-13 NOTE — Patient Instructions (Addendum)
Thank you, [name] for allowing [place] to provide your care today. Today we discussed your general health, your blood sugar levels and also your vitamin D levels.  Your blood pressure looks very much controlled with the medications you are on.  I will do a blood work to assess your kidney function today.  Because we started you on vitamin D supplementation in July I will recheck your vitamin D levels today as well. I have ordered the following labs for you:  Lab Orders         Glucose, capillary         Basic metabolic panel         Vitamin D (25 hydroxy)         POC Hbg A1C      Tests ordered today:    Referrals ordered today:   Referral Orders  No referral(s) requested today     I have ordered the following medication/changed the following medications:   Stop the following medications: There are no discontinued medications.   Start the following medications: No orders of the defined types were placed in this encounter.    Follow up: 6 months   Remember:   Should you have any questions or concerns please call the internal medicine clinic at [phone].    Kathleen Lime, M.D [facility]

## 2023-08-13 NOTE — Assessment & Plan Note (Signed)
History of vitamin D deficiency.  Was initiated on 50,000 units in July.  Will recheck her vitamin D levels and if appropriate restart small dose of daily vitamin D.  Will follow-up with Dr. Mayford Knife for a more definite plan -Check vitamin D levels

## 2023-08-13 NOTE — Progress Notes (Signed)
CC: Diabetes follow-up  HPI:  Melissa [name] is a 72 y.o. female living with a history stated below and presents today for diabetes follow-up and also discuss the other chronic conditions. Please see problem based assessment and plan for additional details.  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   Plantar fasciitis of left foot 10/01/2013   PONV (postoperative nausea and vomiting)    Primary osteoarthritis of left knee 07/11/2021   Right-sided face pain 01/07/2019   Rotator cuff tear 02/02/2017   Seasonal allergies    Sleep apnea 2022   no cpap   Trigger middle finger of right hand 11/12/2018   Vitreous degeneration, unspecified eye 10/25/2012    Current Outpatient Medications on File Prior to Visit  Medication Sig Dispense Refill   Accu-Chek FastClix Lancets MISC USE TO TEST BLOOD SUGAR UP TO FOUR TIMES DAILY 306 each 1   ACCU-CHEK GUIDE test strip USE AS DIRECTED FOUR TIMES DAILY 100 strip 11   ALPRAZolam (XANAX) 0.25 MG tablet TAKE 1 TABLET(0.25 MG) BY MOUTH AT BEDTIME AS NEEDED FOR SLEEP 30 tablet 0   blood glucose meter kit and supplies KIT Dispense based on patient and insurance preference. Use up to four times daily as directed. DX Code: E11.9 1 each 0   Crisaborole (EUCRISA) 2 % OINT Apply 1 Application topically 2 (two) times daily as needed (Itchy rash). 100 g 5   desonide (DESOWEN) 0.05 % ointment Apply 1 Application topically 2 (two) times daily as  needed (rash). 60 g 5   Dulaglutide (TRULICITY) 1.5 MG/0.5ML SOAJ Inject 1.5 mg into the skin once a week. 6 mL 3   escitalopram (LEXAPRO) 10 MG tablet TAKE 1 TABLET(10 MG) BY MOUTH DAILY 90 tablet 3   esomeprazole (NEXIUM) 20 MG capsule Take 20 mg by mouth once a week.     losartan-hydrochlorothiazide (HYZAAR) 50-12.5 MG tablet Take 1 tablet by mouth daily. 90 tablet 3   meloxicam (MOBIC) 7.5 MG tablet Take 1 tablet (7.5 mg total) by mouth 2 (two) times daily as needed for pain. 30 tablet 2   pimecrolimus (ELIDEL) 1 % cream Apply topically 2 (two) times daily. 30 g 1   ramelteon (ROZEREM) 8 MG tablet Take 1 tablet (8 mg total) by mouth at bedtime as needed for sleep. 30 tablet 3   RYALTRIS 665-25 MCG/ACT SUSP Place 2 sprays into the nose daily as needed. 29 g 5   VYZULTA 0.024 % SOLN Place 1 drop into both eyes at bedtime.     No current facility-administered medications on file prior to visit.    Family History  Problem Relation Age of Onset   Cancer Maternal Grandmother 84       ovarian   Diabetes Maternal Grandmother    Hypertension Maternal Grandmother    Cancer Mother 36  bone, MM   Stroke Other        unsure who   CAD Neg Hx    Colon cancer Neg Hx    Esophageal cancer Neg Hx    Rectal cancer Neg Hx    Stomach cancer Neg Hx     Social History   Socioeconomic History   Marital status: Single    Spouse name: Not on file   Number of children: 0   Years of education: 16   Highest education level: Not on file  Occupational History   Occupation: work with disabilities/ Retired    Associate Professor: Therapist, sports  Tobacco Use   Smoking status: Never    Passive exposure: Past   Smokeless tobacco: Never  Vaping Use   Vaping status: Never Used  Substance and Sexual Activity   Alcohol use: No   Drug use: No   Sexual activity: Not Currently    Birth control/protection: Surgical  Other Topics Concern   Not on file  Social History Narrative   Caffeine: rare   Lives  alone   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[employer]   Fun/Hobbies: Gardening    Social Determinants of Health   Financial Resource Strain: Medium Risk (01/25/2023)   Overall Financial Resource Strain (CARDIA)    Difficulty of Paying Living Expenses: Somewhat hard  Food Insecurity: No Food Insecurity (01/25/2023)   Hunger Vital Sign    Worried About Running Out of Food in the Last Year: Never true    Ran Out of Food in the Last Year: Never true  Transportation Needs: No Transportation Needs (01/25/2023)   PRAPARE - Administrator, Civil Service (Medical): No    Lack of Transportation (Non-Medical): No  Physical Activity: Insufficiently Active (01/25/2023)   Exercise Vital Sign    Days of Exercise per Week: 2 days    Minutes of Exercise per Session: 40 min  Stress: Stress Concern Present (01/25/2023)   [employer] of Occupational Health - Occupational Stress Questionnaire    Feeling of Stress : Rather much  Social Connections: Unknown (01/25/2023)   Social Connection and Isolation Panel [NHANES]    Frequency of Communication with Friends and Family: Once a week    Frequency of Social Gatherings with Friends and Family: Not on file    Attends Religious Services: Not on file    Active Member of Clubs or Organizations: Not on file    Attends Banker Meetings: More than 4 times per year    Marital Status: Never married  Intimate Partner Violence: Not At Risk (01/25/2023)   Humiliation, Afraid, Rape, and Kick questionnaire    Fear of Current or Ex-Partner: No    Emotionally Abused: No    Physically Abused: No    Sexually Abused: No    Review of Systems: ROS negative except for what is noted on the assessment and plan.  Vitals:   08/13/23 1352  BP: 129/79  Pulse: 80  Temp: 98.3 F (36.8 C)  TempSrc: Oral  SpO2: 100%  Weight: 194 lb 4.8 oz (88.1 kg)  Height: 5' 5.25" (1.657 m)    Physical Exam: Constitutional: well-appearing woman ,  sitting in chair , in no acute distress HENT: normocephalic atraumatic, mucous membranes moist Eyes: conjunctiva non-erythematous Cardiovascular: regular rate and rhythm, no m/r/g Pulmonary/Chest: normal work of breathing on room air, lungs clear to auscultation bilaterally Skin: warm and dry Psych: Normal mood and affect, mildly anxious  Assessment & Plan:  Vitamin D deficiency History of vitamin D deficiency.  Was initiated on 50,000 units in July.  Will recheck her vitamin D levels and if appropriate restart small dose of daily vitamin D.  Will follow-up with Dr. Mayford Knife for a more definite plan -Check vitamin D levels  HTN (hypertension) History of hypertension.  Blood pressure in office today is 129/79.On Hyzaar 50-12.5.  Patient endorses compliance to her antihypertensive medication and her blood pressure seems to be well controlled.  I will get a basic metabolic panel to assess her kidney function. - Recheck BMP  Type 2 diabetes mellitus, controlled (HCC), without complications History of type 2 diabetes.  Hemoglobin A1c 6 months ago was 5.6.  Point-of-care A1c in the office today is 6, on Trulicity 1.5 mg. -Continue Trulicity - Recheck hemoglobin A1c in 3 to 6 months   Patient seen with Dr. Ginger Carne, M.D [facility] Phone: [phone] Date 08/13/2023 Time 2:32 PM

## 2023-08-14 ENCOUNTER — Other Ambulatory Visit: Payer: Self-pay

## 2023-08-14 LAB — BASIC METABOLIC PANEL
BUN/Creatinine Ratio: 18 (ref 12–28)
BUN: 14 mg/dL (ref 8–27)
CO2: 21 mmol/L (ref 20–29)
Calcium: 9.9 mg/dL (ref 8.7–10.3)
Chloride: 101 mmol/L (ref 96–106)
Creatinine, Ser: 0.8 mg/dL (ref 0.57–1.00)
Glucose: 135 mg/dL — ABNORMAL HIGH (ref 70–99)
Potassium: 3.8 mmol/L (ref 3.5–5.2)
Sodium: 140 mmol/L (ref 134–144)
eGFR: 78 mL/min/{1.73_m2} (ref >59)

## 2023-08-14 LAB — VITAMIN D 25 HYDROXY (VIT D DEFICIENCY, FRACTURES): Vit D, 25-Hydroxy: 29.4 ng/mL — ABNORMAL LOW (ref 30.0–100.0)

## 2023-08-14 NOTE — Telephone Encounter (Signed)
OPEN ERROR

## 2023-08-15 ENCOUNTER — Encounter: Payer: Self-pay | Admitting: Internal Medicine

## 2023-08-15 ENCOUNTER — Other Ambulatory Visit: Payer: Self-pay | Admitting: Student

## 2023-08-15 ENCOUNTER — Telehealth: Payer: Self-pay | Admitting: *Deleted

## 2023-08-15 DIAGNOSIS — F32A Depression, unspecified: Secondary | ICD-10-CM

## 2023-08-15 DIAGNOSIS — R21 Rash and other nonspecific skin eruption: Secondary | ICD-10-CM

## 2023-08-15 DIAGNOSIS — F419 Anxiety disorder, unspecified: Secondary | ICD-10-CM

## 2023-08-15 MED ORDER — ALPRAZOLAM 0.25 MG PO TABS: ORAL_TABLET | ORAL | 0 refills | Status: DC

## 2023-08-15 MED ORDER — DESONIDE 0.05 % EX OINT: 1.0000 | TOPICAL_OINTMENT | Freq: Two times a day (BID) | CUTANEOUS | 5 refills | Status: AC | PRN

## 2023-08-15 NOTE — Telephone Encounter (Signed)
Informed patient that Dr. Mickie Bail said that her Vit D level is almost normal and would like for her to start Vit D 800 units other the counter 1 tablet daily.  Patient asked about the refill for the Hydroquron Creme for her Facial hair.  Spoke to Dr. Mickie Bail will send in a prescription for the creme, will also do a referral to Dermatology.  Patient voiced understanding of getting and taking the OTC Vit D.

## 2023-08-16 ENCOUNTER — Other Ambulatory Visit: Payer: Self-pay | Admitting: Student

## 2023-08-16 MED ORDER — HYDROQUINONE 4 % EX CREA: TOPICAL_CREAM | Freq: Two times a day (BID) | CUTANEOUS | 0 refills | Status: DC

## 2023-08-17 NOTE — Progress Notes (Addendum)
Internal Medicine Clinic Attending  I was physically present during the key portions of the resident provided service and participated in the medical decision making of patient's management care. I reviewed pertinent patient test results.  The assessment, diagnosis, and plan were formulated together and I agree with the documentation in the resident's note.  Reymundo Poll, MD

## 2023-09-17 DIAGNOSIS — H401133 Primary open-angle glaucoma, bilateral, severe stage: Secondary | ICD-10-CM | POA: Diagnosis not present

## 2023-09-19 ENCOUNTER — Ambulatory Visit: Payer: 59 | Admitting: Physical Therapy

## 2023-09-20 ENCOUNTER — Ambulatory Visit (INDEPENDENT_AMBULATORY_CARE_PROVIDER_SITE_OTHER): Payer: 59 | Admitting: Licensed Clinical Social Worker

## 2023-09-20 DIAGNOSIS — F419 Anxiety disorder, unspecified: Secondary | ICD-10-CM | POA: Diagnosis not present

## 2023-09-20 DIAGNOSIS — F32A Depression, unspecified: Secondary | ICD-10-CM

## 2023-09-20 NOTE — Patient Instructions (Signed)
 Melissa [name]

## 2023-09-20 NOTE — BH Specialist Note (Signed)
Integrated Behavioral Health Follow Up In-Person Visit  MRN: [id number] Name: Melissa [name]  Number of Integrated Behavioral Health Clinician visits: Additional Visit  Session Start time: 1330   Session End time: 1430  Total time in minutes: 60   Types of Service: Individual psychotherapy  Interpretor:No. Interpretor Name and Language: N/A  Subjective: Melissa [name] is a 73 y.o. female   Patient was referred by PCP for Behavioral health. Patient reports the following symptoms/concerns: The behavioral health consultant [facility]) conducted a session today with the patient, addressing several concerns:  **Focus difficulties, compulsive and impulsive behavior, and recent manic episodes.  Screening for OCD, bipolar disorder/mania, and Adult ADHD, which the patient did not meet criteria for.  Family history of mental health issues, though specific diagnoses are unknown. Mother's passing at age 84 and family history of heart disease.  Communication challenges, including rapid speech, difficulty processing social cues, and regulating conversation.  Insomnia issues. The [facility] and patient agreed to: Continue working on Manufacturing systems engineer. Develop coping strategies. Establish a consistent bedtime of 8:30 PM. Practice calming exercises before bed to address insomnia.  While the patient did not meet diagnostic criteria for OCD, bipolar disorder, or ADHD, the reported symptoms and family history suggest potential underlying mental health concerns. The combination of focus issues, impulsivity, and manic episodes could indicate a need for further assessment.   Ongoing monitoring and potential referral for specialized assessment may be warranted if symptoms persist or worsen.   Objective: Mood: Anxious and Affect: Appropriate  Risk of harm to self or others: No plan to harm self or others   Patient and/or Family's Strengths/Protective Factors: Social connections  Goals  Addressed: Patient will:  Reduce symptoms of: anxiety, compulsions, and insomnia   Progress towards Goals: Ongoing  Interventions: Interventions utilized:  CBT Cognitive Behavioral Therapy Standardized Assessments completed: Not Needed  Patient and/or Family Response: Patient agrees to continued services  Patient Centered Plan: Patient is on the following Treatment Plan(s): CBT Assessment: Patient currently experiencing Anxiety.   Patient may benefit from CBT.  Plan: Follow up with behavioral health clinician on : Within the next 30 days  Christen Butter, MSW, LCSW-A She/Her Behavioral Health Clinician [facility]  Internal Medicine Center Direct Dial:[phone]  Fax [phone] Main Office Phone: [phone] [address] [place] [place], [place] [address] Website: [facility]  [place], [place]  [facility]

## 2023-10-03 ENCOUNTER — Other Ambulatory Visit: Payer: Self-pay | Admitting: Student

## 2023-10-03 DIAGNOSIS — F32A Depression, unspecified: Secondary | ICD-10-CM

## 2023-10-09 DIAGNOSIS — H35373 Puckering of macula, bilateral: Secondary | ICD-10-CM | POA: Diagnosis not present

## 2023-10-09 DIAGNOSIS — H5213 Myopia, bilateral: Secondary | ICD-10-CM | POA: Diagnosis not present

## 2023-10-09 DIAGNOSIS — H52222 Regular astigmatism, left eye: Secondary | ICD-10-CM | POA: Diagnosis not present

## 2023-10-09 DIAGNOSIS — H401133 Primary open-angle glaucoma, bilateral, severe stage: Secondary | ICD-10-CM | POA: Diagnosis not present

## 2023-10-12 ENCOUNTER — Other Ambulatory Visit (HOSPITAL_BASED_OUTPATIENT_CLINIC_OR_DEPARTMENT_OTHER): Payer: Self-pay | Admitting: Cardiology

## 2023-10-12 DIAGNOSIS — Z8249 Family history of ischemic heart disease and other diseases of the circulatory system: Secondary | ICD-10-CM

## 2023-10-14 ENCOUNTER — Other Ambulatory Visit: Payer: Self-pay

## 2023-10-14 ENCOUNTER — Emergency Department (HOSPITAL_COMMUNITY): Payer: 59

## 2023-10-14 ENCOUNTER — Emergency Department (HOSPITAL_COMMUNITY)
Admission: EM | Admit: 2023-10-14 | Discharge: 2023-10-14 | Disposition: A | Discharge status: Home / Self Care | Payer: 59 | Attending: Emergency Medicine | Admitting: Emergency Medicine

## 2023-10-14 ENCOUNTER — Telehealth: Payer: Self-pay | Admitting: Student

## 2023-10-14 DIAGNOSIS — M25552 Pain in left hip: Secondary | ICD-10-CM | POA: Insufficient documentation

## 2023-10-14 DIAGNOSIS — Z96643 Presence of artificial hip joint, bilateral: Secondary | ICD-10-CM | POA: Diagnosis not present

## 2023-10-14 DIAGNOSIS — E119 Type 2 diabetes mellitus without complications: Secondary | ICD-10-CM | POA: Insufficient documentation

## 2023-10-14 DIAGNOSIS — Z79899 Other long term (current) drug therapy: Secondary | ICD-10-CM | POA: Insufficient documentation

## 2023-10-14 DIAGNOSIS — W010XXA Fall on same level from slipping, tripping and stumbling without subsequent striking against object, initial encounter: Secondary | ICD-10-CM | POA: Diagnosis not present

## 2023-10-14 DIAGNOSIS — M79605 Pain in left leg: Secondary | ICD-10-CM | POA: Diagnosis not present

## 2023-10-14 DIAGNOSIS — Y92000 Kitchen of unspecified non-institutional (private) residence as  the place of occurrence of the external cause: Secondary | ICD-10-CM | POA: Insufficient documentation

## 2023-10-14 DIAGNOSIS — I10 Essential (primary) hypertension: Secondary | ICD-10-CM | POA: Insufficient documentation

## 2023-10-14 DIAGNOSIS — Z91040 Latex allergy status: Secondary | ICD-10-CM | POA: Diagnosis not present

## 2023-10-14 DIAGNOSIS — W19XXXA Unspecified fall, initial encounter: Secondary | ICD-10-CM

## 2023-10-14 DIAGNOSIS — Z96652 Presence of left artificial knee joint: Secondary | ICD-10-CM | POA: Diagnosis not present

## 2023-10-14 MED ORDER — NAPROXEN 500 MG PO TABS: 500.0000 mg | ORAL_TABLET | Freq: Two times a day (BID) | ORAL | 0 refills | Status: AC

## 2023-10-14 MED ORDER — ACETAMINOPHEN 500 MG PO TABS
1000.0000 mg | ORAL_TABLET | Freq: Once | ORAL | Status: AC
  Administered 2023-10-14: 1000 mg via ORAL
  Filled 2023-10-14: qty 2

## 2023-10-14 NOTE — ED Triage Notes (Signed)
 Pt arrived via POV. C/o pain in L hip, and thigh following fall last night. No head trauma. Pt able to walk w/minimal issue.  AOx4

## 2023-10-14 NOTE — Discharge Instructions (Addendum)
 It was a pleasure caring for you today.  Imaging today was reassuring.  Please follow-up with your primary care provider in the next 7 days for follow-up.  Seek emergency care if experiencing any new or worsening symptoms.  Alternating between 650 mg Tylenol  and 400 mg Advil: The best way to alternate taking Acetaminophen  (example Tylenol ) and Ibuprofen (example Advil/Motrin) is to take them 3 hours apart. For example, if you take ibuprofen at 6 am you can then take Tylenol  at 9 am. You can continue this regimen throughout the day, making sure you do not exceed the recommended maximum dose for each drug.

## 2023-10-14 NOTE — Telephone Encounter (Addendum)
 Received after-hours page. Returned call to patient and confirmed DOB. Patient reports recent fall and was seen by [facility] ED today. No acute fractures. Given tylenol  with relief in ED and discharged. Reports taking OTC tylenol  without much relief. Request for advice for further pain management. Encouraged patient to continue with Tylenol  1000 mg Q8H. Sent in Rx for Naproxen  500 mg BID x 5 days to 24hr [employer]. No hx of gastric ulcers or kidney dysfunction. Encouraged patient to call to schedule ED f/u visit with PCP. Return precautions discussed with patient. Patient verbalizes understanding and agrees with current plan. All questions answered/addressed.

## 2023-10-14 NOTE — ED Provider Notes (Signed)
 [place] EMERGENCY DEPARTMENT AT [facility] Provider Note   CSN: [id number] Arrival date & time: 10/14/23  9245     History  Chief Complaint  Patient presents with   Fall   Hip Pain    Melissa [name] is a 73 y.o. female with PMHx OA, DDD, DM type 2, anxiety, HLD, HTN who presents to ED concerned for left hip pain after slipping and falling in kitchen last night. Patient stating that she fell onto her left hip. Denies head trauma, LOC, seizure, blood thinner. Patient has not taken any pain medication at home.   Denies fever, chest pain, dyspnea, cough, nausea, vomiting, diarrhea, dysuria, hematuria, hematochezia. Denies numbness/paresthesias.     Fall  Hip Pain       Home Medications Prior to Admission medications   Medication Sig Start Date End Date Taking? Authorizing Provider  Accu-Chek FastClix Lancets MISC USE TO TEST BLOOD SUGAR UP TO FOUR TIMES DAILY 11/25/18   Dragnev, Romualdo Capers, NP  ACCU-CHEK GUIDE test strip USE AS DIRECTED FOUR TIMES DAILY 06/28/20   Norleen Lynwood ORN, MD  ALPRAZolam  (XANAX ) 0.25 MG tablet TAKE 1 TABLET(0.25 MG) BY MOUTH AT BEDTIME AS NEEDED FOR SLEEP 10/04/23   Trudy Mliss Dragon, MD  blood glucose meter kit and supplies KIT Dispense based on patient and insurance preference. Use up to four times daily as directed. DX Code: E11.9 02/11/18   Kathlyne Romualdo Capers, NP  Crisaborole  (EUCRISA ) 2 % OINT Apply 1 Application topically 2 (two) times daily as needed (Itchy rash). 06/22/23   Jeneal Danita Macintosh, MD  desonide  (DESOWEN ) 0.05 % ointment Apply 1 Application topically 2 (two) times daily as needed (rash). 08/15/23   Amoako, Prince, MD  Dulaglutide  (TRULICITY ) 1.5 MG/0.5ML SOAJ Inject 1.5 mg into the skin once a week. 07/06/23   Trudy Mliss Dragon, MD  escitalopram  (LEXAPRO ) 10 MG tablet TAKE 1 TABLET(10 MG) BY MOUTH DAILY 09/14/22   Trudy Mliss Dragon, MD  esomeprazole (NEXIUM) 20 MG capsule Take 20 mg by mouth once  a week.    [provider]  hydroquinone  4 % cream Apply topically 2 (two) times daily. 08/16/23   Amoako, Prince, MD  losartan -hydrochlorothiazide  (HYZAAR) 50-12.5 MG tablet Take 1 tablet by mouth daily. 06/19/23   Trudy Mliss Dragon, MD  meloxicam  (MOBIC ) 7.5 MG tablet Take 1 tablet (7.5 mg total) by mouth 2 (two) times daily as needed for pain. 07/18/23   Jerri Kay HERO, MD  pimecrolimus  (ELIDEL ) 1 % cream Apply topically 2 (two) times daily. 07/24/23   Jeneal Danita Macintosh, MD  ramelteon  (ROZEREM ) 8 MG tablet Take 1 tablet (8 mg total) by mouth at bedtime as needed for sleep. 01/25/23 01/25/24  Trudy Mliss Dragon, MD  RYALTRIS  [phone] MCG/ACT SUSP Place 2 sprays into the nose daily as needed. 06/22/23   Jeneal Danita Macintosh, MD  VYZULTA 0.024 % SOLN Place 1 drop into both eyes at bedtime. 05/16/21   [provider]      Allergies    Codeine, Metformin  and related, Oxycodone, and Latex    Review of Systems   Review of Systems  Musculoskeletal:        Hip pain    Physical Exam Updated Vital Signs BP 138/87 (BP Location: Left Arm)   Pulse 78   Temp 98.6 F (37 C) (Oral)   Resp 16   Ht 5' 5 (1.651 m)   Wt 94.8 kg   SpO2 100%   BMI 34.78 kg/m  Physical Exam Vitals and nursing note reviewed.  Constitutional:      General: She is not in acute distress.    Appearance: She is not ill-appearing or toxic-appearing.  HENT:     Head: Normocephalic and atraumatic.  Eyes:     General: No scleral icterus.       Right eye: No discharge.        Left eye: No discharge.     Conjunctiva/sclera: Conjunctivae normal.  Cardiovascular:     Rate and Rhythm: Normal rate.  Pulmonary:     Effort: Pulmonary effort is normal.  Abdominal:     General: Abdomen is flat.  Musculoskeletal:     Comments: Patient ambulatory with steady gait. No tenderness to palpation of left hip. There is some mild tenderness to palpation of BL buttocks. +2 pedal pulses. Sensation to  light touch intact. No swelling appreciated.  Skin:    General: Skin is warm and dry.  Neurological:     General: No focal deficit present.     Mental Status: She is alert and oriented to person, place, and time. Mental status is at baseline.  Psychiatric:        Mood and Affect: Mood normal.        Behavior: Behavior normal.     ED Results / Procedures / Treatments   Labs (all labs ordered are listed, but only abnormal results are displayed) Labs Reviewed - No data to display  EKG None  Radiology DG Hip Unilat With Pelvis 2-3 Views Left Result Date: 10/14/2023 CLINICAL DATA:  Left hip and leg pain status post fall last night EXAM: DG HIP (WITH OR WITHOUT PELVIS) 2-3V LEFT; LEFT FEMUR 2 VIEWS COMPARISON:  Left hip radiographs 05/21/2012 FINDINGS: Pelvis/left hip: Bilateral total hip prostheses are intact without periprosthetic fracture or lucency. No fracture or dislocation. Soft tissues are unremarkable. Left femur: Left total knee prosthesis appears intact without periprosthetic fracture or lucency. No additional fracture or dislocation. Soft tissues are unremarkable. IMPRESSION: No acute abnormality of the left hip or femur. 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 Electronically Signed   By: Aliene Lloyd M.D.   On: 10/14/2023 09:12   DG Femur Min 2 Views Left Result Date: 10/14/2023 CLINICAL DATA:  Left hip and leg pain status post fall last night EXAM: DG HIP (WITH OR WITHOUT PELVIS) 2-3V LEFT; LEFT FEMUR 2 VIEWS COMPARISON:  Left hip radiographs 05/21/2012 FINDINGS: Pelvis/left hip: Bilateral total hip prostheses are intact without periprosthetic fracture or lucency. No fracture or dislocation. Soft tissues are unremarkable. Left femur: Left total knee prosthesis appears intact without periprosthetic fracture or lucency. No additional fracture or dislocation. Soft tissues are unremarkable. IMPRESSION: No acute abnormality of the left hip or femur. Uncomplicated  bilateral total hip and left knee prostheses. Electronically Signed   By: Aliene Lloyd M.D.   On: 10/14/2023 09:12    Procedures Procedures    Medications Ordered in ED Medications  acetaminophen  (TYLENOL ) tablet 1,000 mg (1,000 mg Oral Given 10/14/23 0857)    ED Course/ Medical Decision Making/ A&P                                 Medical Decision Making Amount and/or Complexity of Data Reviewed Radiology: ordered.  Risk OTC drugs.   This patient presents to the ED for concern of left hip pain, this involves an extensive number of treatment options, and is a  complaint that carries with it a high risk of complications and morbidity.  The differential diagnosis includes hemarthrosis, gout, septic joint, fracture, tendonitis, carpal tunnel syndrome, muscle strain, bursitis, compartment syndrome   Co morbidities that complicate the patient evaluation   OA, DDD, DM type 2, anxiety, HLD, HTN   Additional history obtained:  Dr. Trudy PCP   Problem List / ED Course / Critical interventions / Medication management  Patient presents to ED concern for left hip pain after a slip and fall yesterday.  Denies head trauma, LOC, seizures, blood thinners.  Physical exam reassuring.  Patient ambulatory with steady gait.  Patient afebrile with stable vitals.  Patient denies any infectious symptoms today. Triage provider ordering hip/pelvis and femur xrays. I independently visualized and interpreted imaging which showed no acute process. I agree with the radiologist interpretation. Shared results with patient. Shared results with patient.  Educated patient that she will need to follow-up with her primary care provider in the next 7 days if symptoms are not resolving appropriately.  Patient verbalized understanding of plan.   Provided patient with a dose of Tylenol  in the ED which she tolerated well.  Could patient alternating ibuprofen/Tylenol  for pain management.  Offered patient a walker for  ambulation assistance but she declined. Staffed with Dr. Pamella who agrees with plan.  I have reviewed the patients home medicines and have made adjustments as needed Patient afebrile with stable vitals.  Provided with return precautions.  Discharged in good condition.   Ddx these are considered less likely due to history of present illness and physical exam -hemarthrosis: no blood thinners; ROM intact -fracture: xray without concern  -compartment syndrome: area not tense; neurovascularly intact   Social Determinants of Health:  geriatric         Final Clinical Impression(s) / ED Diagnoses Final diagnoses:  Fall, initial encounter  Left hip pain    Rx / DC Orders ED Discharge Orders     None         Hoy Nidia FALCON, [place] 10/14/23 9076    Pamella Ozell LABOR, DO 10/17/23 0818

## 2023-10-17 ENCOUNTER — Ambulatory Visit: Payer: 59 | Admitting: Licensed Clinical Social Worker

## 2023-10-17 ENCOUNTER — Telehealth: Payer: Self-pay | Admitting: *Deleted

## 2023-10-17 ENCOUNTER — Other Ambulatory Visit: Payer: Self-pay | Admitting: Internal Medicine

## 2023-10-17 ENCOUNTER — Telehealth: Payer: Self-pay | Admitting: Internal Medicine

## 2023-10-17 DIAGNOSIS — F429 Obsessive-compulsive disorder, unspecified: Secondary | ICD-10-CM | POA: Diagnosis not present

## 2023-10-17 DIAGNOSIS — F419 Anxiety disorder, unspecified: Secondary | ICD-10-CM | POA: Diagnosis not present

## 2023-10-17 DIAGNOSIS — Z1231 Encounter for screening mammogram for malignant neoplasm of breast: Secondary | ICD-10-CM

## 2023-10-17 NOTE — BH Specialist Note (Unsigned)
Integrated Behavioral Health via Telemedicine Visit  10/17/2023 Lorey CHIARA COLTRIN [id number]  Number of Integrated Behavioral Health Clinician visits: Additional Visit  Session Start time: 1330   Session End time: 1430  Total time in minutes: 60   Referring Provider: PCP Patient/Family location: Home [facility] Provider location: Office All persons participating in visit: [facility] and Patient Types of Service: Individual psychotherapy and Telephone visit  I connected with Curtistine F Blackwelder via  Telephone  and verified that I am speaking with the correct person using two identifiers. Discussed confidentiality: Yes   I discussed the limitations of telemedicine and the availability of in person appointments.  Discussed there is a possibility of technology failure and discussed alternative modes of communication if that failure occurs.  I discussed that engaging in this telemedicine visit, they consent to the provision of behavioral healthcare and the services will be billed under their insurance.  Patient and/or legal guardian expressed understanding and consented to Telemedicine visit: Yes   Presenting Concerns: Patient and/or family reports the following symptoms/concerns: a 60-minute telehealth session was conducted via telephone using HIPAA-compliant processes. The Licensed Clinical Engineer, building services (LCSW-A), acting as a Visual merchandiser [facility]), verified the patient's identity using their date of birth and name. The patient agreed to the phone call, and the session participants were the client and the [facility].  The session began with the patient discussing a recent fall. The patient reported visiting the Emergency Department (ED) and mentioned still experiencing after-effects from the incident. The [facility] confirmed that the patient is being followed by their medical team and understands the importance of reaching out if needed.  During the session, the patient expressed feeling  that she has improved her communication skills. The [facility] noted tremendous growth in the patient's communication abilities and commended the patient for doing a wonderful and exceptional job in this area. The patient reported prioritizing tasks, setting boundaries, and working on establishing a bedtime routine, all of which indicate significant progress in her treatment goals.  The [facility] observed that the patient is making substantial strides in her overall well-being and coping strategies. The patient agreed to continue regular check-ins with the [facility] and committed to utilizing the skills learned during their sessions. The [facility] encouraged the patient to maintain this positive trajectory and reinforced the importance of consistent application of these newly acquired skills.  The session concluded with a plan for the patient to continue engaging in telehealth sessions and to focus on further developing and implementing the strategies discussed. The [facility] documented the patient's progress and will continue to monitor and support the patient's growth in subsequent sessions.   Patient and/or Family's Strengths/Protective Factors: Social connections  Goals Addressed: Patient will:  Reduce symptoms of: anxiety and compulsions   Progress towards Goals: Ongoing  Interventions: Interventions utilized:  Mindfulness or Management consultant and CBT Cognitive Behavioral Therapy Standardized Assessments completed: Not Needed  Patient and/or Family Response: Patient continues to benefit from services.  Assessment: Patient currently experiencing Anxiety and compulsion.   Patient may benefit from ongoing CBT.  Plan: Follow up with behavioral health clinician on : Patient has been scheduled for within 30 days   I discussed the assessment and treatment plan with the patient and/or parent/guardian. They were provided an opportunity to ask questions and all were answered. They agreed with the plan and demonstrated  an understanding of the instructions.   They were advised to call back or seek an in-person evaluation if the symptoms worsen or if the  condition fails to improve as anticipated.  Christen Butter, MSW, LCSW-A She/Her Behavioral Health Clinician [facility]  Internal Medicine Center Direct [phone]  Fax [phone] Main Office Phone: [phone] [address] [place] [place], [place] [address] Website: [facility] Internal Medicine [facility]  [place], [place]  [place]

## 2023-10-17 NOTE — Telephone Encounter (Signed)
Patient is due for her yearly mammogram .    The [facility] would like a new placed.  Pt's Last mammogram was as follows:  MM 3D SCREENING MAMMOGRAM BILATERAL BREAST (Accession [id number]) (Order [id number]) Imaging Date: 01/31/2023 Department: [facility] Released By: Vickki Hearing Authorizing: Miguel Aschoff, MD    Can a new order be placed and we will out to the patient to get her sch?

## 2023-10-17 NOTE — Telephone Encounter (Signed)
Patient contacted with mammogram appointment (June 4.2025 @  1:00 [place] 12:45 pm) appointment also mailed to patient. Patient aware of the 75.00 no show fee for not calling to reschedule or canceling.

## 2023-10-24 ENCOUNTER — Inpatient Hospital Stay (HOSPITAL_COMMUNITY): Admission: RE | Admit: 2023-10-24 | Payer: 59 | Source: Ambulatory Visit

## 2023-11-08 ENCOUNTER — Other Ambulatory Visit: Payer: Self-pay

## 2023-11-08 ENCOUNTER — Other Ambulatory Visit (HOSPITAL_COMMUNITY): Payer: 59

## 2023-11-08 MED ORDER — ESCITALOPRAM OXALATE 10 MG PO TABS: 10.0000 mg | ORAL_TABLET | Freq: Every day | ORAL | 3 refills | Status: DC

## 2023-11-15 ENCOUNTER — Other Ambulatory Visit (HOSPITAL_COMMUNITY)

## 2023-11-21 ENCOUNTER — Other Ambulatory Visit (HOSPITAL_COMMUNITY)

## 2023-11-29 ENCOUNTER — Ambulatory Visit: Payer: 59 | Admitting: Internal Medicine

## 2023-11-29 ENCOUNTER — Ambulatory Visit: Attending: Internal Medicine

## 2023-11-29 VITALS — BP 155/94 | HR 59 | Temp 98.4°F | Ht 65.0 in | Wt 211.1 lb

## 2023-11-29 DIAGNOSIS — R002 Palpitations: Secondary | ICD-10-CM

## 2023-11-29 DIAGNOSIS — L280 Lichen simplex chronicus: Secondary | ICD-10-CM

## 2023-11-29 DIAGNOSIS — E119 Type 2 diabetes mellitus without complications: Secondary | ICD-10-CM | POA: Diagnosis not present

## 2023-11-29 DIAGNOSIS — Z6835 Body mass index (BMI) 35.0-35.9, adult: Secondary | ICD-10-CM

## 2023-11-29 DIAGNOSIS — E66812 Obesity, class 2: Secondary | ICD-10-CM

## 2023-11-29 DIAGNOSIS — F419 Anxiety disorder, unspecified: Secondary | ICD-10-CM

## 2023-11-29 DIAGNOSIS — F32A Depression, unspecified: Secondary | ICD-10-CM

## 2023-11-29 DIAGNOSIS — E7800 Pure hypercholesterolemia, unspecified: Secondary | ICD-10-CM

## 2023-11-29 DIAGNOSIS — I10 Essential (primary) hypertension: Secondary | ICD-10-CM

## 2023-11-29 DIAGNOSIS — Z794 Long term (current) use of insulin: Secondary | ICD-10-CM

## 2023-11-29 LAB — POCT GLYCOSYLATED HEMOGLOBIN (HGB A1C): Hemoglobin A1C: 5.5 % (ref 4.0–5.6)

## 2023-11-29 LAB — GLUCOSE, CAPILLARY: Glucose-Capillary: 107 mg/dL — ABNORMAL HIGH (ref 70–99)

## 2023-11-29 MED ORDER — OLMESARTAN MEDOXOMIL 40 MG PO TABS: 40.0000 mg | ORAL_TABLET | Freq: Every day | ORAL | 11 refills | Status: AC

## 2023-11-29 NOTE — Progress Notes (Unsigned)
 EP to read.
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
## 2023-11-29 NOTE — Patient Instructions (Signed)
 Melissa [name], it was great to catch up with you today! These are our take-home points:  1)  Let's stop your combo high BP pill that has the hydrochlorothiazide/fluid pill in it.  2) For bp, I will change to a medicine similar to the one you're on.  I'll have you come back for a bp check with the nurse only in a couple of weeks after you've started it.  Make sure you take your medicine that morning.  3) Try some compression stockings to manage swelling during the day.  You can get them over the counter at the pharmacy  4)  You will receive a heart monitor in the mail with instructions.  You will wear it for 2 weeks and it will monitor your heart beat for that period of time.  I will contact you with the results and we'll go from there.  5) Diabetes looks great!  6)  Cholesterol check today - we're going to have to start treatment for this soon.  I'll let you know the results and we'll go from there.  7)  try to get out a couple of times a week to walk and work on weight management so that you can stay healthy and comfortable!  Let's get together in person in about 3 months.  Take care and stay well,  Dr. Mayford Knife

## 2023-11-30 ENCOUNTER — Other Ambulatory Visit: Payer: Self-pay

## 2023-11-30 DIAGNOSIS — F32A Anxiety disorder, unspecified: Secondary | ICD-10-CM

## 2023-11-30 LAB — MICROALBUMIN / CREATININE URINE RATIO
Creatinine, Urine: 88.5 mg/dL
Microalb/Creat Ratio: 5 mg/g{creat} (ref 0–29)
Microalbumin, Urine: 4.5 ug/mL

## 2023-11-30 LAB — LIPID PANEL
Chol/HDL Ratio: 3.6 ratio (ref 0.0–4.4)
Cholesterol, Total: 223 mg/dL — ABNORMAL HIGH (ref 100–199)
HDL: 62 mg/dL (ref >39)
LDL Chol Calc (NIH): 146 mg/dL — ABNORMAL HIGH (ref 0–99)
Triglycerides: 85 mg/dL (ref 0–149)
VLDL Cholesterol Cal: 15 mg/dL (ref 5–40)

## 2023-11-30 MED ORDER — ALPRAZOLAM 0.25 MG PO TABS: ORAL_TABLET | ORAL | 0 refills | Status: DC

## 2023-11-30 MED ORDER — HYDROQUINONE 4 % EX CREA: TOPICAL_CREAM | Freq: Two times a day (BID) | CUTANEOUS | 0 refills | Status: AC

## 2023-12-03 ENCOUNTER — Encounter: Payer: Self-pay | Admitting: Internal Medicine

## 2023-12-03 DIAGNOSIS — L280 Lichen simplex chronicus: Secondary | ICD-10-CM | POA: Insufficient documentation

## 2023-12-03 DIAGNOSIS — R002 Palpitations: Secondary | ICD-10-CM | POA: Insufficient documentation

## 2023-12-03 NOTE — Assessment & Plan Note (Addendum)
 155/94 on Hyzaar 50-12.5 mg daily, though she didn't take it today.  Aggravated by the diuretic component. Will change to olmesartan 40 mg daily and reassess sxs at next visit. She is concerned that she may develop leg swelling (this has occurred during the day, usually symmetric and asymptomatic, resolves overnight). Advised that this could be managed with compression stockings otc.

## 2023-12-03 NOTE — Assessment & Plan Note (Signed)
 Excellent control on Trulicitiy 1.5 mg alone; A1c 5.5 today. Ophthalmology visit recently; no diabetic retinopathy Foot exam completed today. UAC ordered today.

## 2023-12-03 NOTE — Assessment & Plan Note (Signed)
 Weight has increased from 192 pounds 5 ounces in October 20 4 to 211 pounds today, despite Trulicity.  She has not been as physically active as she would like due to her orthopedic problems and winter weather.  Dietary choices and caloric quantity are contributing.  She is mindful.

## 2023-12-03 NOTE — Assessment & Plan Note (Signed)
 Has not been on treatment due to her concern about statin induced muscle pain. Will recheck lipids today and anticipate we'll have to make a decision on starting at least a low dose statin plus ezetimibe.

## 2023-12-03 NOTE — Assessment & Plan Note (Signed)
 She has found benefit from appointments with [facility] clinician Ms. Ashe and continues to do well with escitalopram 10 mg daily and prn alprazolam 0.25 mg nightly prn.

## 2023-12-03 NOTE — Assessment & Plan Note (Signed)
 Subjective sense of heart beat abnormality, often noticed when lying in bed preparing for sleep; short periods of "gasping" though without difficulty breathing, and unassociated with chest tightness, pain, nausea, or dizziness.  Alerted to possibility of atrial fibrillation by news/media. Exam is regular rhythm today. Will order Zio patch to further monitor.

## 2023-12-11 ENCOUNTER — Encounter (HOSPITAL_COMMUNITY): Payer: Self-pay

## 2023-12-11 ENCOUNTER — Other Ambulatory Visit (HOSPITAL_COMMUNITY)

## 2023-12-25 DIAGNOSIS — R002 Palpitations: Secondary | ICD-10-CM | POA: Diagnosis not present

## 2023-12-26 ENCOUNTER — Ambulatory Visit: Payer: 59 | Admitting: Allergy

## 2023-12-26 ENCOUNTER — Telehealth (HOSPITAL_BASED_OUTPATIENT_CLINIC_OR_DEPARTMENT_OTHER): Payer: Self-pay | Admitting: Orthopaedic Surgery

## 2023-12-26 NOTE — Telephone Encounter (Signed)
 Patient attorney wants to send Dr  B some paper work to be filled out. The [facility] name  is Melissa [name] [phone])  [email or web address]

## 2023-12-27 DIAGNOSIS — R002 Palpitations: Secondary | ICD-10-CM

## 2024-01-03 ENCOUNTER — Ambulatory Visit: Admitting: Allergy

## 2024-01-07 ENCOUNTER — Other Ambulatory Visit: Payer: Self-pay

## 2024-01-07 DIAGNOSIS — E119 Type 2 diabetes mellitus without complications: Secondary | ICD-10-CM

## 2024-01-07 MED ORDER — TRULICITY 1.5 MG/0.5ML ~~LOC~~ SOAJ: 1.5000 mg | SUBCUTANEOUS | 3 refills | Status: DC

## 2024-01-07 NOTE — Telephone Encounter (Signed)
 Medication sent to pharmacy

## 2024-01-08 ENCOUNTER — Other Ambulatory Visit: Payer: Self-pay

## 2024-01-08 DIAGNOSIS — F419 Anxiety disorder, unspecified: Secondary | ICD-10-CM

## 2024-01-08 MED ORDER — ALPRAZOLAM 0.25 MG PO TABS: ORAL_TABLET | ORAL | 0 refills | Status: DC

## 2024-02-01 ENCOUNTER — Encounter: Admitting: Student

## 2024-02-04 ENCOUNTER — Ambulatory Visit (HOSPITAL_BASED_OUTPATIENT_CLINIC_OR_DEPARTMENT_OTHER): Admitting: Orthopaedic Surgery

## 2024-02-04 ENCOUNTER — Telehealth: Admitting: Physician Assistant

## 2024-02-04 ENCOUNTER — Encounter (HOSPITAL_BASED_OUTPATIENT_CLINIC_OR_DEPARTMENT_OTHER): Payer: Self-pay

## 2024-02-04 DIAGNOSIS — R002 Palpitations: Secondary | ICD-10-CM

## 2024-02-04 NOTE — Progress Notes (Signed)
 Virtual Visit Consent   Melissa [name], you are scheduled for a virtual visit with a [facility] provider today. Just as with appointments in the office, your consent must be obtained to participate. Your consent will be active for this visit and any virtual visit you may have with one of our providers in the next 365 days. If you have a MyChart account, a copy of this consent can be sent to you electronically.  As this is a virtual visit, video technology does not allow for your provider to perform a traditional examination. This may limit your provider's ability to fully assess your condition. If your provider identifies any concerns that need to be evaluated in person or the need to arrange testing (such as labs, EKG, etc.), we will make arrangements to do so. Although advances in technology are sophisticated, we cannot ensure that it will always work on either your end or our end. If the connection with a video visit is poor, the visit may have to be switched to a telephone visit. With either a video or telephone visit, we are not always able to ensure that we have a secure connection.  By engaging in this virtual visit, you consent to the provision of healthcare and authorize for your insurance to be billed (if applicable) for the services provided during this visit. Depending on your insurance coverage, you may receive a charge related to this service.  I need to obtain your verbal consent now. Are you willing to proceed with your visit today? Melissa [name] has provided verbal consent on 02/04/2024 for a virtual visit (video or telephone). Char Common Ward, PA-C  Date: 02/04/2024 7:37 PM   Virtual Visit via Video Note   I, Char Common Ward, connected with  Melissa [name]  ([id number], 1950-09-13) on 02/04/24 at  7:00 PM EDT by a video-enabled telemedicine application and verified that I am speaking with the correct person using two identifiers.  Location: Patient: Virtual Visit Location  Patient: Home Provider: Virtual Visit Location Provider: Home Office   I discussed the limitations of evaluation and management by telemedicine and the availability of in person appointments. The patient expressed understanding and agreed to proceed.    History of Present Illness: Melissa [name] is a 73 y.o. who identifies as a female who was assigned female at birth, and is being seen today for palpitations that occur at rest. She denies chest pain.  She reports shortness of breath intermittently, sometimes with activity. She reports some intermittent swelling in her lower extremities that started in the last few month, this occurs more when she is on her feet more often.  Swelling improves when she elevates her legs.  She reports she saw her PCP about 4 months ago. States she had a heart monitor.   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   Decreased functional mobility 02/01/2022   History of colonic polyps 01/26/2022   History of total knee replacement, bilateral 07/11/2021   Sleep apnea, intolerant of CPAP, declines 05/24/2021   Healthcare maintenance 01/07/2019   Chronic left shoulder pain 01/08/2018   History of bilateral hip replacements 07/19/2017  Acquired renal cyst of left kidney 01/01/2016   Spondylosis without myelopathy or radiculopathy, 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/2014   Class 2 severe obesity due to excess calories with serious comorbidity and body mass index (BMI) of 35.0 to 35.9 in adult [facility]) 11/13/2012   Glaucoma    Type 2 diabetes mellitus, controlled (HCC), without complications    Anxiety and depression    HTN  (hypertension) 01/06/2012   GERD (gastroesophageal reflux disease) 01/06/2012   Hypercholesteremia 01/06/2012    Allergies:  Allergies  Allergen Reactions   Codeine Other (See Comments)    palpitations   Metformin  And Related Nausea Only   Oxycodone Nausea And Vomiting   Latex Dermatitis, Itching and Rash   Medications:  Current Outpatient Medications:    Accu-Chek FastClix Lancets MISC, USE TO TEST BLOOD SUGAR UP TO FOUR TIMES DAILY, Disp: 306 each, Rfl: 1   ACCU-CHEK GUIDE test strip, USE AS DIRECTED FOUR TIMES DAILY, Disp: 100 strip, Rfl: 11   ALPRAZolam  (XANAX ) 0.25 MG tablet, TAKE 1 TABLET(0.25 MG) BY MOUTH AT BEDTIME AS NEEDED FOR SLEEP, Disp: 30 tablet, Rfl: 0   aspirin  EC 81 MG tablet, Take 81 mg by mouth daily. Swallow whole., Disp: , Rfl:    blood glucose meter kit and supplies KIT, Dispense based on patient and insurance preference. Use up to four times daily as directed. DX Code: E11.9, Disp: 1 each, Rfl: 0   desonide  (DESOWEN ) 0.05 % ointment, Apply 1 Application topically 2 (two) times daily as needed (rash)., Disp: 60 g, Rfl: 5   Dulaglutide  (TRULICITY ) 1.5 MG/0.5ML SOAJ, Inject 1.5 mg into the skin once a week., Disp: 6 mL, Rfl: 3   escitalopram  (LEXAPRO ) 10 MG tablet, Take 1 tablet (10 mg total) by mouth daily. TAKE 1 TABLET(10 MG) BY MOUTH DAILY, Disp: 90 tablet, Rfl: 3   esomeprazole (NEXIUM) 20 MG capsule, Take 20 mg by mouth once a week., Disp: , Rfl:    hydroquinone  4 % cream, Apply topically 2 (two) times daily., Disp: 28.35 g, Rfl: 0   olmesartan  (BENICAR ) 40 MG tablet, Take 1 tablet (40 mg total) by mouth daily., Disp: 30 tablet, Rfl: 11   VYZULTA 0.024 % SOLN, Place 1 drop into both eyes at bedtime., Disp: , Rfl:   Observations/Objective: Patient is well-developed, well-nourished in no acute distress.  Resting comfortably  at home.  Head is normocephalic, atraumatic.  No labored breathing.  Speech is clear and coherent with logical content.  Patient is  alert and oriented at baseline.    Assessment and Plan: 1. Intermittent palpitations (Primary)  Pt with no chest pain or acute symptoms at this time.  She has been evaluated for palpitations in the past, holter monitor with no acute findings.  Advised pt call PCP in the morning to make an appointment.  ED precautions given.   Follow Up Instructions: I discussed the assessment and treatment plan with the patient. The patient was provided an opportunity to ask questions and all were answered. The patient agreed with the plan and demonstrated an understanding of the instructions.  A copy of instructions were sent to the patient via MyChart unless otherwise noted below.     The patient was advised to call back or seek an in-person evaluation if the symptoms worsen or if the condition fails to improve as anticipated.    Char Common Ward, PA-C

## 2024-02-04 NOTE — Patient Instructions (Signed)
 Janelle Melita Springer, thank you for joining Char Common Ward, PA-C for today's virtual visit.  While this provider is not your primary care provider (PCP), if your PCP is located in our provider database this encounter information will be shared with them immediately following your visit.   A [facility] MyChart account gives you access to today's visit and all your visits, tests, and labs performed at [facility] " click here if you don't have a [facility] MyChart account or go to [email or web address]  Consent: (Patient) Vivica Melita Springer provided verbal consent for this virtual visit at the beginning of the encounter.  Current Medications:  Current Outpatient Medications:    Accu-Chek FastClix Lancets MISC, USE TO TEST BLOOD SUGAR UP TO FOUR TIMES DAILY, Disp: 306 each, Rfl: 1   ACCU-CHEK GUIDE test strip, USE AS DIRECTED FOUR TIMES DAILY, Disp: 100 strip, Rfl: 11   ALPRAZolam  (XANAX ) 0.25 MG tablet, TAKE 1 TABLET(0.25 MG) BY MOUTH AT BEDTIME AS NEEDED FOR SLEEP, Disp: 30 tablet, Rfl: 0   aspirin  EC 81 MG tablet, Take 81 mg by mouth daily. Swallow whole., Disp: , Rfl:    blood glucose meter kit and supplies KIT, Dispense based on patient and insurance preference. Use up to four times daily as directed. DX Code: E11.9, Disp: 1 each, Rfl: 0   desonide  (DESOWEN ) 0.05 % ointment, Apply 1 Application topically 2 (two) times daily as needed (rash)., Disp: 60 g, Rfl: 5   Dulaglutide  (TRULICITY ) 1.5 MG/0.5ML SOAJ, Inject 1.5 mg into the skin once a week., Disp: 6 mL, Rfl: 3   escitalopram  (LEXAPRO ) 10 MG tablet, Take 1 tablet (10 mg total) by mouth daily. TAKE 1 TABLET(10 MG) BY MOUTH DAILY, Disp: 90 tablet, Rfl: 3   esomeprazole (NEXIUM) 20 MG capsule, Take 20 mg by mouth once a week., Disp: , Rfl:    hydroquinone  4 % cream, Apply topically 2 (two) times daily., Disp: 28.35 g, Rfl: 0   olmesartan  (BENICAR ) 40 MG tablet, Take 1 tablet (40 mg total) by mouth daily., Disp: 30 tablet, Rfl:  11   VYZULTA 0.024 % SOLN, Place 1 drop into both eyes at bedtime., Disp: , Rfl:    Medications ordered in this encounter:  No orders of the defined types were placed in this encounter.    *If you need refills on other medications prior to your next appointment, please contact your pharmacy*  Follow-Up: Call back or seek an in-person evaluation if the symptoms worsen or if the condition fails to improve as anticipated.  [facility] [phone]  Other Instructions Recommend calling Primary Care Physician tomorrow to make an appointment.  If symptoms become worse or you are experiencing chest pain, shortness of breath, trouble breathing go to the Emergency Department for evaluation.    If you have been instructed to have an in-person evaluation today at a local Urgent Care facility, please use the link below. It will take you to a list of all of our available [facility], including address, phone number and hours of operation. Please do not delay care.  [facility]  If you or a family member do not have a primary care provider, use the link below to schedule a visit and establish care. When you choose a [facility] primary care physician or advanced practice provider, you gain a long-term partner in health. Find a Primary Care Provider  Learn more about [facility]'s in-office and virtual care options: [facility] -  Get Care Now

## 2024-02-05 ENCOUNTER — Emergency Department (HOSPITAL_COMMUNITY)

## 2024-02-05 ENCOUNTER — Emergency Department (HOSPITAL_COMMUNITY)
Admission: EM | Admit: 2024-02-05 | Discharge: 2024-02-06 | Disposition: A | Discharge status: Home / Self Care | Attending: Emergency Medicine | Admitting: Emergency Medicine

## 2024-02-05 ENCOUNTER — Other Ambulatory Visit: Payer: Self-pay

## 2024-02-05 ENCOUNTER — Encounter (HOSPITAL_COMMUNITY): Payer: Self-pay

## 2024-02-05 ENCOUNTER — Ambulatory Visit: Payer: Self-pay | Admitting: *Deleted

## 2024-02-05 DIAGNOSIS — R0602 Shortness of breath: Secondary | ICD-10-CM | POA: Insufficient documentation

## 2024-02-05 DIAGNOSIS — Z96611 Presence of right artificial shoulder joint: Secondary | ICD-10-CM | POA: Diagnosis not present

## 2024-02-05 DIAGNOSIS — R002 Palpitations: Secondary | ICD-10-CM | POA: Insufficient documentation

## 2024-02-05 DIAGNOSIS — R109 Unspecified abdominal pain: Secondary | ICD-10-CM | POA: Diagnosis not present

## 2024-02-05 DIAGNOSIS — E119 Type 2 diabetes mellitus without complications: Secondary | ICD-10-CM | POA: Diagnosis not present

## 2024-02-05 DIAGNOSIS — I10 Essential (primary) hypertension: Secondary | ICD-10-CM | POA: Diagnosis not present

## 2024-02-05 DIAGNOSIS — Z7982 Long term (current) use of aspirin: Secondary | ICD-10-CM | POA: Diagnosis not present

## 2024-02-05 DIAGNOSIS — Z91040 Latex allergy status: Secondary | ICD-10-CM | POA: Insufficient documentation

## 2024-02-05 DIAGNOSIS — Z8616 Personal history of COVID-19: Secondary | ICD-10-CM | POA: Diagnosis not present

## 2024-02-05 DIAGNOSIS — Z794 Long term (current) use of insulin: Secondary | ICD-10-CM | POA: Diagnosis not present

## 2024-02-05 DIAGNOSIS — R072 Precordial pain: Secondary | ICD-10-CM | POA: Diagnosis not present

## 2024-02-05 DIAGNOSIS — R600 Localized edema: Secondary | ICD-10-CM | POA: Diagnosis not present

## 2024-02-05 DIAGNOSIS — Z79899 Other long term (current) drug therapy: Secondary | ICD-10-CM | POA: Diagnosis not present

## 2024-02-05 DIAGNOSIS — R071 Chest pain on breathing: Secondary | ICD-10-CM | POA: Diagnosis not present

## 2024-02-05 LAB — BASIC METABOLIC PANEL WITH GFR
Anion gap: 10 (ref 5–15)
BUN: 18 mg/dL (ref 8–23)
CO2: 23 mmol/L (ref 22–32)
Calcium: 8.9 mg/dL (ref 8.9–10.3)
Chloride: 104 mmol/L (ref 98–111)
Creatinine, Ser: 0.67 mg/dL (ref 0.44–1.00)
GFR, Estimated: >60 mL/min (ref >60)
Glucose, Bld: 93 mg/dL (ref 70–99)
Potassium: 3.7 mmol/L (ref 3.5–5.1)
Sodium: 137 mmol/L (ref 135–145)

## 2024-02-05 LAB — CBC
HCT: 40.4 % (ref 36.0–46.0)
Hemoglobin: 12.5 g/dL (ref 12.0–15.0)
MCH: 26.3 pg (ref 26.0–34.0)
MCHC: 30.9 g/dL (ref 30.0–36.0)
MCV: 84.9 fL (ref 80.0–100.0)
Platelets: 283 10*3/uL (ref 150–400)
RBC: 4.76 MIL/uL (ref 3.87–5.11)
RDW: 15.5 % (ref 11.5–15.5)
WBC: 4.6 10*3/uL (ref 4.0–10.5)
nRBC: 0 % (ref 0.0–0.2)

## 2024-02-05 LAB — BRAIN NATRIURETIC PEPTIDE: B Natriuretic Peptide: 23.3 pg/mL (ref 0.0–100.0)

## 2024-02-05 NOTE — Telephone Encounter (Signed)
  Chief Complaint: irregular heart rate Symptoms: irregular heart rate, SOB with exertion, fatigue, sweating increased, some chest pain at times, dizziness Frequency: 2 months- seems to be getting worse Pertinent Negatives: Patient denies fast heart rate now Disposition: [x] ED /[] Urgent Care (no appt availability in office) / [] Appointment(In office/virtual)/ []  [facility]/ [] Home Care/ [] Refused Recommended Disposition /[] [facility]/ []  Follow-up with PCP Additional Notes: ED advised    Copied from CRM [id number]. Topic: Clinical - Red Word Triage >> Feb 05, 2024  4:00 PM Tisa Forester wrote: Red Word that prompted transfer to Nurse Triage: both legs swelling , frequent urination , rapid heart beat and headaches   Patient call back number [phone] Reason for Disposition  New or worsened shortness of breath with activity (dyspnea on exertion)  Answer Assessment - Initial Assessment Questions 1. DESCRIPTION: "Please describe your heart rate or heartbeat that you are having" (e.g., fast/slow, regular/irregular, skipped or extra beats, "palpitations")     Fast/skips 2. ONSET: "When did it start?" (Minutes, hours or days)      2 month- patient has been waiting to see if it would get better 3. DURATION: "How long does it last" (e.g., seconds, minutes, hours)     Not constant- but will last a good while 4. PATTERN "Does it come and go, or has it been constant since it started?"  "Does it get worse with exertion?"   "Are you feeling it now?"     Comes and goes, gets worse with exertion, fatigued 5. TAP: "Using your hand, can you tap out what you are feeling on a chair or table in front of you, so that I can hear?" (Note: not all patients can do this)       Patient has worn monitor 6. HEART RATE: "Can you tell me your heart rate?" "How many beats in 15 seconds?"  (Note: not all patients can do this)       Normal now 7. RECURRENT SYMPTOM: "Have you ever had this before?" If  Yes, ask: "When was the last time?" and "What happened that time?"      New symptoms 8. CAUSE: "What do you think is causing the palpitations?"     tachycardia 9. CARDIAC HISTORY: "Do you have any history of heart disease?" (e.g., heart attack, angina, bypass surgery, angioplasty, arrhythmia)      no 10. OTHER SYMPTOMS: "Do you have any other symptoms?" (e.g., dizziness, chest pain, sweating, difficulty breathing)       Fatigue, sweating, SOB with exertion, some sharpness "once in a while"  Protocols used: Heart Rate and Heartbeat Questions-A-AH

## 2024-02-05 NOTE — ED Triage Notes (Signed)
 Pt arrives via POV. Pt reports for the past couple of months she has been experiencing sob, swelling to both legs, dizziness, intermittent chest discomfort, and fatigue.

## 2024-02-05 NOTE — ED Provider Triage Note (Signed)
 Emergency Medicine Provider Triage Evaluation Note  Melissa [name] , a 73 y.o. female  was evaluated in triage.  Pt complains of palpitations.  Review of Systems  Positive: ?paroxysmal a-fib, Negative: fever  Physical Exam  BP (!) 164/97 (BP Location: Right Arm)   Pulse 79   Temp 98.2 F (36.8 C) (Oral)   Resp 18   SpO2 99%  Gen:   Awake, no distress   Resp:  Normal effort  MSK:   Moves extremities without difficulty  Other:    Medical Decision Making  Medically screening exam initiated at 6:29 PM.  Appropriate orders placed.  Melissa [name] was informed that the remainder of the evaluation will be completed by another provider, this initial triage assessment does not replace that evaluation, and the importance of remaining in the ED until their evaluation is complete.  Patient with symptoms for one month, daily, of fatigue, dizziness, leg swelling, palpitations. Seen by MD that was concerned for atrial fibrillation and sent to eD.    Mandy Second, PA-C 02/05/24 1830

## 2024-02-06 ENCOUNTER — Ambulatory Visit: Payer: 59

## 2024-02-06 ENCOUNTER — Telehealth: Payer: Self-pay | Admitting: *Deleted

## 2024-02-06 ENCOUNTER — Emergency Department (HOSPITAL_COMMUNITY)

## 2024-02-06 DIAGNOSIS — N3289 Other specified disorders of bladder: Secondary | ICD-10-CM | POA: Diagnosis not present

## 2024-02-06 DIAGNOSIS — N281 Cyst of kidney, acquired: Secondary | ICD-10-CM | POA: Diagnosis not present

## 2024-02-06 LAB — TROPONIN I (HIGH SENSITIVITY): Troponin I (High Sensitivity): 7 ng/L (ref <18)

## 2024-02-06 MED ORDER — HYDRALAZINE HCL 20 MG/ML IJ SOLN
5.0000 mg | Freq: Once | INTRAMUSCULAR | Status: AC
  Administered 2024-02-06: 5 mg via INTRAVENOUS
  Filled 2024-02-06: qty 1

## 2024-02-06 MED ORDER — OMEPRAZOLE 20 MG PO CPDR
20.0000 mg | DELAYED_RELEASE_CAPSULE | Freq: Every day | ORAL | 0 refills | Status: AC
Start: 2024-02-06 — End: ?

## 2024-02-06 MED ORDER — IOHEXOL 300 MG/ML  SOLN
100.0000 mL | Freq: Once | INTRAMUSCULAR | Status: AC | PRN
  Administered 2024-02-06: 100 mL via INTRAVENOUS

## 2024-02-06 MED ORDER — AMLODIPINE BESYLATE 5 MG PO TABS
10.0000 mg | ORAL_TABLET | Freq: Once | ORAL | Status: AC
  Administered 2024-02-06: 10 mg via ORAL
  Filled 2024-02-06: qty 2

## 2024-02-06 NOTE — Telephone Encounter (Signed)
 Does triage need to give this patient a call?  Asking for urgent referral to cardiology, but no appts until next week.  Please advise.Thanks,[place]----- Message -----From: Mae Schlossman FSent: 02/05/2024   3:53 PM EDTTo: Imp AdminSubject: Appointment Request                          Appointment Request From: Abner Ables WrightWith Provider: Jonelle Neri [facility]]Preferred Date Range: AnyPreferred Times: Any TimeReason for visit: Office VisitHealth Maintenance Topic:Comments:Need a referral to a cardiologist ASAP having heart issues

## 2024-02-06 NOTE — ED Provider Notes (Signed)
 [facility] EMERGENCY DEPARTMENT AT [facility] Provider Note   CSN: [id number] Arrival date & time: 02/05/24  1714     History  Chief Complaint  Patient presents with   Palpitations   Leg Swelling   Shortness of Breath    Melissa [name] is a 73 y.o. female.  The history is provided by the patient.  Chest Pain Pain location:  Substernal area Pain quality: dull   Pain radiates to:  Does not radiate Pain severity:  Moderate Onset quality:  Gradual (several days) Timing:  Constant Progression:  Waxing and waning Context: at rest   Context comment:  Lying flat Relieved by:  Nothing Worsened by:  Nothing Ineffective treatments:  None tried Associated symptoms: no anorexia, no anxiety, no diaphoresis, no fever, no numbness, no orthopnea, no vomiting and no weakness   Patient with HTN and PUD presents with chest pain and palpitations, at night lying flat.  No DOE.  No SOB, no n/v/d.  No leg pain.  No travel.     Past Medical History:  Diagnosis Date   Allergy    Anxiety    Arthritis    s/p R TKR   Class 2 severe obesity due to excess calories with serious comorbidity and body mass index (BMI) of 35.0 to 35.9 in adult [facility]) 11/13/2012   COVID-19 05/04/2023   Cyst of left kidney 2015   by lumbar MRI pending renal US    DDD (degenerative disc disease), lumbar 03/2014   severe L2-3 with L HNP with L2/3 nerve root impingement [facility] @ [facility])   Depression with anxiety    Diabetes type 2, controlled (HCC) 2011   borderline   Glaucoma    History of ulcer disease    HLD (hyperlipidemia)    no meds taken   Hypertension    OAB (overactive bladder) 10/14/2019   Osteoarthritis of spine with radiculopathy, 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 Authorizing Provider  omeprazole (PRILOSEC) 20 MG capsule Take 1 capsule (20 mg total) by mouth daily. 02/06/24  Yes Jayel Scaduto, MD  Accu-Chek FastClix Lancets MISC USE TO TEST BLOOD SUGAR UP TO FOUR TIMES DAILY 11/25/18   Dragnev, Licia Reek, NP  ACCU-CHEK GUIDE test strip USE AS DIRECTED FOUR TIMES DAILY 06/28/20   Roslyn Coombe, MD  ALPRAZolam  (XANAX ) 0.25 MG tablet TAKE 1 TABLET(0.25 MG) BY MOUTH AT BEDTIME AS NEEDED FOR SLEEP 01/08/24   Sherol Dixie, MD  aspirin  EC 81 MG tablet Take 81 mg by mouth daily. Swallow whole.    [provider]  blood glucose meter kit and supplies KIT Dispense based on patient and insurance preference. Use up to four times daily as directed. DX Code: E11.9 02/11/18   Refugio Cantor, NP  desonide  (DESOWEN ) 0.05 % ointment Apply 1 Application topically 2 (two) times daily as needed (rash). 08/15/23   Amoako, Prince, MD  Dulaglutide  (TRULICITY ) 1.5 MG/0.5ML SOAJ Inject 1.5 mg into the skin once a week. 01/07/24   Sherol Dixie,  MD  escitalopram  (LEXAPRO ) 10 MG tablet Take 1 tablet (10 mg total) by mouth daily. TAKE 1 TABLET(10 MG) BY MOUTH DAILY 11/08/23 11/02/24  Sherol Dixie, MD  esomeprazole (NEXIUM) 20 MG capsule Take 20 mg by mouth once a week.    [provider]  hydroquinone  4 % cream Apply topically 2 (two) times daily. 11/30/23   Sherol Dixie, MD  olmesartan  (BENICAR ) 40 MG tablet Take 1 tablet (40 mg total) by mouth daily. 11/29/23 11/28/24  Sherol Dixie, MD  VYZULTA 0.024 % SOLN Place 1 drop into both eyes at bedtime. 05/16/21   [provider]      Allergies    Codeine, Metformin  and related, Oxycodone, and Latex    Review of Systems   Review of Systems   Constitutional:  Negative for diaphoresis and fever.  HENT:  Negative for facial swelling.   Respiratory:  Negative for cough, wheezing and stridor.   Cardiovascular:  Positive for chest pain. Negative for orthopnea and leg swelling.  Gastrointestinal:  Negative for anorexia and vomiting.  Neurological:  Negative for weakness and numbness.  All other systems reviewed and are negative.   Physical Exam Updated Vital Signs BP (!) 161/104   Pulse 78   Temp 97.8 F (36.6 C) (Oral)   Resp 16   SpO2 100%  Physical Exam Vitals and nursing note reviewed.  Constitutional:      General: She is not in acute distress.    Appearance: She is well-developed.  HENT:     Head: Normocephalic and atraumatic.     Nose: Nose normal.  Eyes:     Pupils: Pupils are equal, round, and reactive to light.  Cardiovascular:     Rate and Rhythm: Normal rate and regular rhythm.     Pulses: Normal pulses.     Heart sounds: Normal heart sounds.  Pulmonary:     Effort: Pulmonary effort is normal. No respiratory distress.     Breath sounds: Normal breath sounds.  Abdominal:     General: Bowel sounds are normal. There is no distension.     Palpations: Abdomen is soft.     Tenderness: There is no abdominal tenderness. There is no guarding or rebound.  Musculoskeletal:        General: Normal range of motion.     Cervical back: Neck supple.  Skin:    General: Skin is dry.     Capillary Refill: Capillary refill takes less than 2 seconds.     Findings: No erythema or rash.  Neurological:     General: No focal deficit present.     Deep Tendon Reflexes: Reflexes normal.  Psychiatric:        Mood and Affect: Mood normal.     ED Results / Procedures / Treatments   Labs (all labs ordered are listed, but only abnormal results are displayed) Results for orders placed or performed during the hospital encounter of 02/05/24  Basic metabolic panel   Collection Time: 02/05/24  5:41 PM  Result Value Ref Range    Sodium 137 135 - 145 mmol/L   Potassium 3.7 3.5 - 5.1 mmol/L   Chloride 104 98 - 111 mmol/L   CO2 23 22 - 32 mmol/L   Glucose, Bld 93 70 - 99 mg/dL   BUN 18 8 - 23 mg/dL   Creatinine, Ser 8.29 0.44 - 1.00 mg/dL   Calcium  8.9 8.9 - 10.3 mg/dL   GFR, Estimated >56 >21 mL/min   Anion  gap 10 5 - 15  CBC   Collection Time: 02/05/24  5:41 PM  Result Value Ref Range   WBC 4.6 4.0 - 10.5 K/uL   RBC 4.76 3.87 - 5.11 MIL/uL   Hemoglobin 12.5 12.0 - 15.0 g/dL   HCT 81.1 91.4 - 78.2 %   MCV 84.9 80.0 - 100.0 fL   MCH 26.3 26.0 - 34.0 pg   MCHC 30.9 30.0 - 36.0 g/dL   RDW 95.6 21.3 - 08.6 %   Platelets 283 150 - 400 K/uL   nRBC 0.0 0.0 - 0.2 %  BNP (Order if Patient has history of Heart Failure)   Collection Time: 02/05/24  5:41 PM  Result Value Ref Range   B Natriuretic Peptide 23.3 0.0 - 100.0 pg/mL  Troponin I (High Sensitivity)   Collection Time: 02/06/24  1:29 AM  Result Value Ref Range   Troponin I (High Sensitivity) 7 <18 ng/L   CT ABDOMEN PELVIS W CONTRAST Result Date: 02/06/2024 EXAM: CT ABDOMEN AND PELVIS WITH CONTRAST 02/06/2024 05:32:43 AM TECHNIQUE: CT of the abdomen and pelvis was performed with the administration of intravenous contrast. Multiplanar reformatted images are provided for review. Automated exposure control, iterative reconstruction, and/or weight based adjustment of the mA/kV was utilized to reduce the radiation dose to as low as reasonably achievable. COMPARISON: Renal ultrasound 12/31/2015. CLINICAL HISTORY: Abdominal pain, acute, nonlocalized. Abdominal pain, wbc's 4.6, GFR>60, hx of HTN, diabetes, left hip surgery, and hysterectomy with bilateral salpingo-oophorectomy, prev ct a/p 10/06/04. FINDINGS: LOWER CHEST: Linear atelectasis is present at both lung bases. LIVER: The liver is unremarkable. GALLBLADDER AND BILE DUCTS: Gallbladder is unremarkable. No biliary ductal dilatation. SPLEEN: No acute abnormality. PANCREAS: No acute abnormality. ADRENAL GLANDS: No  acute abnormality. KIDNEYS, URETERS AND BLADDER: A 14 mm exophytic simple cyst is present in the interpolar region of the left kidney. Per consensus, no follow-up is needed for simple Bosniak type 1 and 2 renal cysts, unless the patient has a malignancy history or risk factors. No stones in the kidneys or ureters. No hydronephrosis. No perinephric or periureteral stranding. Urinary bladder is unremarkable. GI AND BOWEL: Stomach demonstrates no acute abnormality. There is no bowel obstruction. No appendicitis. PERITONEUM AND RETROPERITONEUM: No ascites. No free air. VASCULATURE: Atherosclerotic changes are present in the proximal iliac arteries bilaterally. No aneurysm or focal stenosis is present. LYMPH NODES: No lymphadenopathy. REPRODUCTIVE ORGANS: No acute abnormality. BONES AND SOFT TISSUES: Degenerative disc disease is present in the lower lumbar spine. Slight anterolisthesis is present at L3-4. No acute osseous abnormality. No focal soft tissue abnormality. IMPRESSION: 1. No acute abnormality related to the clinical history of abdominal pain. Electronically signed by: Audree Leas MD 02/06/2024 06:01 AM EDT RP Workstation: [id number]   DG Chest 2 View Result Date: 02/05/2024 CLINICAL DATA:  sob shortness of breath x 1 month, sharp chest pain that comes and goes x 1 month EXAM: CHEST - 2 VIEW COMPARISON:  Chest x-ray 07/11/2021, CT chest 01/06/2012. FINDINGS: The heart and mediastinal contours are within normal limits. No focal consolidation. No pulmonary edema. No pleural effusion. No pneumothorax. No acute osseous abnormality. Reversed right total shoulder arthroplasty. IMPRESSION: No active cardiopulmonary disease. Electronically Signed   By: Morgane  Naveau M.D.   On: 02/05/2024 19:14    EKG EKG Interpretation Date/Time:  Tuesday February 05 2024 17:23:48 EDT Ventricular Rate:  80 PR Interval:  156 QRS Duration:  63 QT Interval:  361 QTC Calculation: 417 R Axis:   44  Text  Interpretation:  Sinus rhythm Artifact Abnormal ECG Confirmed by Dorenda Gandy [phone]) on 02/05/2024 10:22:39 PM  Radiology CT ABDOMEN PELVIS W CONTRAST Result Date: 02/06/2024 EXAM: CT ABDOMEN AND PELVIS WITH CONTRAST 02/06/2024 05:32:43 AM TECHNIQUE: CT of the abdomen and pelvis was performed with the administration of intravenous contrast. Multiplanar reformatted images are provided for review. Automated exposure control, iterative reconstruction, and/or weight based adjustment of the mA/kV was utilized to reduce the radiation dose to as low as reasonably achievable. COMPARISON: Renal ultrasound 12/31/2015. CLINICAL HISTORY: Abdominal pain, acute, nonlocalized. Abdominal pain, wbc's 4.6, GFR>60, hx of HTN, diabetes, left hip surgery, and hysterectomy with bilateral salpingo-oophorectomy, prev ct a/p 10/06/04. FINDINGS: LOWER CHEST: Linear atelectasis is present at both lung bases. LIVER: The liver is unremarkable. GALLBLADDER AND BILE DUCTS: Gallbladder is unremarkable. No biliary ductal dilatation. SPLEEN: No acute abnormality. PANCREAS: No acute abnormality. ADRENAL GLANDS: No acute abnormality. KIDNEYS, URETERS AND BLADDER: A 14 mm exophytic simple cyst is present in the interpolar region of the left kidney. Per consensus, no follow-up is needed for simple Bosniak type 1 and 2 renal cysts, unless the patient has a malignancy history or risk factors. No stones in the kidneys or ureters. No hydronephrosis. No perinephric or periureteral stranding. Urinary bladder is unremarkable. GI AND BOWEL: Stomach demonstrates no acute abnormality. There is no bowel obstruction. No appendicitis. PERITONEUM AND RETROPERITONEUM: No ascites. No free air. VASCULATURE: Atherosclerotic changes are present in the proximal iliac arteries bilaterally. No aneurysm or focal stenosis is present. LYMPH NODES: No lymphadenopathy. REPRODUCTIVE ORGANS: No acute abnormality. BONES AND SOFT TISSUES: Degenerative disc disease is present in the  lower lumbar spine. Slight anterolisthesis is present at L3-4. No acute osseous abnormality. No focal soft tissue abnormality. IMPRESSION: 1. No acute abnormality related to the clinical history of abdominal pain. Electronically signed by: Audree Leas MD 02/06/2024 06:01 AM EDT RP Workstation: [id number]   DG Chest 2 View Result Date: 02/05/2024 CLINICAL DATA:  sob shortness of breath x 1 month, sharp chest pain that comes and goes x 1 month EXAM: CHEST - 2 VIEW COMPARISON:  Chest x-ray 07/11/2021, CT chest 01/06/2012. FINDINGS: The heart and mediastinal contours are within normal limits. No focal consolidation. No pulmonary edema. No pleural effusion. No pneumothorax. No acute osseous abnormality. Reversed right total shoulder arthroplasty. IMPRESSION: No active cardiopulmonary disease. Electronically Signed   By: Morgane  Naveau M.D.   On: 02/05/2024 19:14    Procedures Procedures    Medications Ordered in ED Medications  hydrALAZINE  (APRESOLINE ) injection 5 mg (5 mg Intravenous Given 02/06/24 0304)  hydrALAZINE  (APRESOLINE ) injection 5 mg (5 mg Intravenous Given 02/06/24 0448)  amLODipine  (NORVASC ) tablet 10 mg (10 mg Oral Given 02/06/24 0448)  iohexol  (OMNIPAQUE ) 300 MG/ML solution 100 mL (100 mLs Intravenous Contrast Given 02/06/24 0514)    ED Course/ Medical Decision Making/ A&P                                 Medical Decision Making Patient with chest pain and symptoms lying   Amount and/or Complexity of Data Reviewed External Data Reviewed: notes.    Details: Previous notes reviewed  Labs: ordered. Radiology: ordered and independent interpretation performed.    Details: Negative cxr mild constipation on CT, no perforation  ECG/medicine tests: ordered and independent interpretation performed. Decision-making details documented in ED Course.  Risk Prescription drug management. Risk Details: I do not believe this is a PE.  No AFIB, no  tachycardia.  No exertional symptoms.  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  Will start PPI.  Restart your blood pressure medication.  Will start PPI and bland diet.  Follow up with your cardiologist for ongoing care.  Stable for discharge.  Strict returns.      Final Clinical Impression(s) / ED Diagnoses Final diagnoses:  Precordial pain   No signs of systemic illness or infection. The patient is nontoxic-appearing on exam and vital signs are within normal limits.  I have reviewed the triage vital signs and the nursing notes. Pertinent labs & imaging results that were available during my care of the patient were reviewed by me and considered in my medical decision making (see chart for details). After history, exam, and medical workup I feel the patient has been appropriately medically screened and is safe for discharge home. Pertinent diagnoses were discussed with the patient. Patient was given return precautions.      Rx / DC Orders ED Discharge Orders          Ordered    omeprazole (PRILOSEC) 20 MG capsule  Daily        02/06/24 0630              Giovanie Lefebre, MD 02/06/24 [phone]

## 2024-02-06 NOTE — Telephone Encounter (Signed)
 I called pt - no answer. Unable to leave a message "Call cannot be completed". Per chart, pt went to the ER yesterday and discharged this am. We have no appts this week.

## 2024-02-06 NOTE — ED Notes (Signed)
 Pt bp is high she said she never took her bp pills for 2 days nurse was notified

## 2024-02-07 NOTE — Telephone Encounter (Signed)
 Pt was called again - no answer; "call cannot be completed".

## 2024-02-12 ENCOUNTER — Ambulatory Visit: Payer: Self-pay | Admitting: Internal Medicine

## 2024-02-18 ENCOUNTER — Encounter: Payer: Self-pay | Admitting: Student

## 2024-02-18 ENCOUNTER — Other Ambulatory Visit: Payer: Self-pay

## 2024-02-18 ENCOUNTER — Ambulatory Visit: Payer: Self-pay | Admitting: Student

## 2024-02-18 VITALS — BP 139/72 | HR 65 | Temp 98.1°F | Ht 65.0 in | Wt 216.8 lb

## 2024-02-18 DIAGNOSIS — F32A Depression, unspecified: Secondary | ICD-10-CM | POA: Diagnosis not present

## 2024-02-18 DIAGNOSIS — E119 Type 2 diabetes mellitus without complications: Secondary | ICD-10-CM | POA: Diagnosis not present

## 2024-02-18 DIAGNOSIS — I10 Essential (primary) hypertension: Secondary | ICD-10-CM

## 2024-02-18 DIAGNOSIS — Z7985 Long-term (current) use of injectable non-insulin antidiabetic drugs: Secondary | ICD-10-CM | POA: Diagnosis not present

## 2024-02-18 DIAGNOSIS — F419 Anxiety disorder, unspecified: Secondary | ICD-10-CM | POA: Diagnosis not present

## 2024-02-18 LAB — POCT GLYCOSYLATED HEMOGLOBIN (HGB A1C): Hemoglobin A1C: 5.8 % — AB (ref 4.0–5.6)

## 2024-02-18 LAB — GLUCOSE, CAPILLARY: Glucose-Capillary: 99 mg/dL (ref 70–99)

## 2024-02-18 MED ORDER — TRULICITY 3 MG/0.5ML ~~LOC~~ SOAJ: 3.0000 mg | SUBCUTANEOUS | 2 refills | Status: DC

## 2024-02-18 MED ORDER — ALPRAZOLAM 0.25 MG PO TABS: ORAL_TABLET | ORAL | 0 refills | Status: DC

## 2024-02-18 NOTE — Progress Notes (Signed)
 CC: Diabetes follow-up  HPI:  [name] is a 73 y.o. female with a past medical history of hypertension, sleep apnea, GERD, type 2 diabetes who presents for follow-up appointment.  Please see assessment and plan for full HPI.  Anxiety: Xanax  0.25 mg as needed nightly, Lexapro  10 mg daily CAD: Aspirin  81 mg daily Diabetes: Trulicity  3.0 mg weekly GERD: Omeprazole  20 mg daily Hypertension: Olmesartan  40 mg daily  Past Medical History:  Diagnosis Date   Allergy    Anxiety    Arthritis    s/p R TKR   Class 2 severe obesity due to excess calories with serious comorbidity and body mass index (BMI) of 35.0 to 35.9 in adult [facility]) 11/13/2012   COVID-19 05/04/2023   Cyst of left kidney 2015   by lumbar MRI pending renal US    DDD (degenerative disc disease), lumbar 03/2014   severe L2-3 with L HNP with L2/3 nerve root impingement [facility] @ [facility])   Depression with anxiety    Diabetes type 2, controlled (HCC) 2011   borderline   Glaucoma    History of ulcer disease    HLD (hyperlipidemia)    no meds taken   Hypertension    OAB (overactive bladder) 10/14/2019   Osteoarthritis of spine with radiculopathy, 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   Plantar fasciitis of left foot 10/01/2013   PONV (postoperative nausea and vomiting)    Primary osteoarthritis of left knee 07/11/2021   Right-sided face pain 01/07/2019   Rotator cuff tear 02/02/2017   Seasonal allergies    Sleep apnea 2022   no cpap   Trigger middle finger of right hand 11/12/2018   Vitreous degeneration, unspecified eye 10/25/2012     Current Outpatient Medications:    Dulaglutide  (TRULICITY ) 3 MG/0.5ML SOAJ, Inject 3 mg as directed once a week., Disp: 6 mL, Rfl: 2   Accu-Chek FastClix Lancets MISC, USE TO TEST BLOOD SUGAR UP TO FOUR TIMES DAILY, Disp: 306 each, Rfl: 1   ACCU-CHEK GUIDE test strip, USE AS DIRECTED FOUR TIMES DAILY, Disp: 100 strip, Rfl: 11   ALPRAZolam  (XANAX ) 0.25 MG tablet, TAKE 1 TABLET(0.25 MG) BY MOUTH  AT BEDTIME AS NEEDED FOR SLEEP, Disp: 30 tablet, Rfl: 0   aspirin  EC 81 MG tablet, Take 81 mg by mouth daily. Swallow whole., Disp: , Rfl:    blood glucose meter kit and supplies KIT, Dispense based on patient and insurance preference. Use up to four times daily as directed. DX Code: E11.9, Disp: 1 each, Rfl: 0   desonide  (DESOWEN ) 0.05 % ointment, Apply 1 Application topically 2 (two) times daily as needed (rash)., Disp: 60 g, Rfl: 5   escitalopram  (LEXAPRO ) 10 MG tablet, Take 1 tablet (10 mg total) by mouth daily. TAKE 1 TABLET(10 MG) BY MOUTH DAILY, Disp: 90 tablet, Rfl: 3   esomeprazole (NEXIUM) 20 MG capsule, Take 20 mg by mouth once a week., Disp: , Rfl:    hydroquinone  4 % cream, Apply topically 2 (two) times daily., Disp: 28.35 g, Rfl: 0   olmesartan  (BENICAR ) 40 MG tablet, Take 1 tablet (40 mg total) by mouth daily., Disp: 30 tablet, Rfl: 11   omeprazole  (PRILOSEC) 20 MG capsule, Take 1 capsule (20 mg total) by mouth daily., Disp: 30 capsule, Rfl: 0   VYZULTA 0.024 % SOLN, Place 1 drop into both eyes at bedtime., Disp: , Rfl:   Review of Systems:   Negative except for what is stated in HPI  Physical Exam:  Vitals:  02/18/24 1032 02/18/24 1038  BP:  139/72  Pulse: 73 65  Temp: 98.1 F (36.7 C)   TempSrc: Oral   SpO2: 98%   Weight: 216 lb 12.8 oz (98.3 kg)   Height: 5' 5 (1.651 m)     General: Patient is sitting comfortably in the room  Head: Normocephalic, atraumatic  Cardio: Regular rate and rhythm, no murmurs, rubs or gallops Pulmonary: Clear to ausculation bilaterally with no rales, rhonchi, and crackles  Extremities: Bilateral lower extremities with no signs of infection, ulceration, or decrease sensation.  Assessment & Plan:   Type 2 diabetes mellitus, controlled (HCC), without complications Patient presents for diabetes follow-up.  Her A1c today is 5.8.  Patient reports that she would like to switch to Ozempic, but I state she is on Trulicity .  I told her that we  can increase her Trulicity .  She agreed.  Foot exam updated today.  No signs of infection or ulcerations.  Strong pedal pulses appreciated.  She is on no other medications.  Diabetes well-controlled.  Plan: - Increase to Trulicity  3 mg weekly - Foot exam updated today - Patient regularly sees an ophthalmologist - Follow-up in 3 months  Anxiety and depression Patient has a past medical history of anxiety and depression.  She is currently on Xanax  0.25 mg nightly as needed.  She is also on Lexapro  10 mg daily.  She states she is doing well from this aspect.  No concerns.    Plan: - Continue Xanax  0.25 mg nightly as needed - Continue Lexapro  10 mg daily  HTN (hypertension) Patient has a past medical history of hypertension.  Blood pressure today is 139/72.  She does not check her blood pressures at home.  Her blood pressure is not well-controlled, however she states she has had episodes where she sometimes feels lightheaded.  I do not feel comfortable titrating her blood pressure medication at this time.  Would like to get a better understanding about her blood pressures.  Will have her keep a log and bring it at her next visit.  Plan: - Continue olmesartan  40 mg daily - Please bring blood pressure log at next visit - Follow-up in 2 weeks  Patient discussed with Dr. Teola Felling, DO PGY-2 Internal Medicine Resident

## 2024-02-18 NOTE — Assessment & Plan Note (Signed)
 Patient has a past medical history of anxiety and depression.  She is currently on Xanax  0.25 mg nightly as needed.  She is also on Lexapro  10 mg daily.  She states she is doing well from this aspect.  No concerns.    Plan: - Continue Xanax  0.25 mg nightly as needed - Continue Lexapro  10 mg daily

## 2024-02-18 NOTE — Assessment & Plan Note (Signed)
 Patient has a past medical history of hypertension.  Blood pressure today is 139/72.  She does not check her blood pressures at home.  Her blood pressure is not well-controlled, however she states she has had episodes where she sometimes feels lightheaded.  I do not feel comfortable titrating her blood pressure medication at this time.  Would like to get a better understanding about her blood pressures.  Will have her keep a log and bring it at her next visit.  Plan: - Continue olmesartan  40 mg daily - Please bring blood pressure log at next visit - Follow-up in 2 weeks

## 2024-02-18 NOTE — Patient Instructions (Addendum)
 Tamelia F Infant, Doane you for allowing me to take part in your care today.  Here are your instructions.  1. Refilled your Xanax    2. Please keep a log and bring you cuff and your blood pressure log to your next visit  3. Increase your Trulicity  3.0 mg weekly.   4. Come back in 2 weeks.   5. Your A1c was 5.8. I am going to to give you handout for nutritiuon and diabetes.   PLEASE BRING YOUR MEDICATIONS TO EVERY APPOINTMENT  Thank you, Dr. Lydia Sams  If you have any other questions please contact the internal medicine clinic at [phone] If it is after hours, please call the [facility] at [phone] and then ask the person who picks up for the resident on call.

## 2024-02-18 NOTE — Assessment & Plan Note (Signed)
 Patient presents for diabetes follow-up.  Her A1c today is 5.8.  Patient reports that she would like to switch to Ozempic, but I state she is on Trulicity .  I told her that we can increase her Trulicity .  She agreed.  Foot exam updated today.  No signs of infection or ulcerations.  Strong pedal pulses appreciated.  She is on no other medications.  Diabetes well-controlled.  Plan: - Increase to Trulicity  3 mg weekly - Foot exam updated today - Patient regularly sees an ophthalmologist - Follow-up in 3 months

## 2024-02-19 ENCOUNTER — Other Ambulatory Visit: Payer: Self-pay

## 2024-02-19 DIAGNOSIS — F32A Depression, unspecified: Secondary | ICD-10-CM

## 2024-02-19 MED ORDER — ALPRAZOLAM 0.25 MG PO TABS
ORAL_TABLET | ORAL | 0 refills | Status: DC
Start: 2024-02-19 — End: 2024-03-31

## 2024-02-19 NOTE — Progress Notes (Signed)
 Internal Medicine Clinic Attending  Case discussed with the resident at the time of the visit.  We reviewed the resident's history and exam and pertinent patient test results.  I agree with the assessment, diagnosis, and plan of care documented in the resident's note.

## 2024-02-19 NOTE — Telephone Encounter (Signed)
 Received a fax from the pharmacy for a rx for Alprazolam . Per pharmacy the prescriber -Lydia Sams is not authorized for drugs DEA class please send a new rx with supervising doctors information.

## 2024-02-26 ENCOUNTER — Telehealth: Payer: Self-pay | Admitting: *Deleted

## 2024-02-26 NOTE — Telephone Encounter (Signed)
 Mammogram 02-06-2024 was a no show per past appt/ schedule.

## 2024-02-26 NOTE — Telephone Encounter (Signed)
 Called patient lvm for call back regarding scheduling mammogram / patient can call and schedule her appointment at [phone] at the breast center.

## 2024-02-28 ENCOUNTER — Ambulatory Visit: Admitting: Allergy

## 2024-03-11 ENCOUNTER — Telehealth: Payer: Self-pay | Admitting: *Deleted

## 2024-03-11 NOTE — Telephone Encounter (Signed)
 Called patient Melissa [name] regarding scheduling mammogram-patient can contact the breast center at [phone] or she can return call and I can make the appointment for her. Waiting call back. She NO SHOW for appointment 02-06-2024,02-13-2024.

## 2024-03-12 ENCOUNTER — Encounter: Payer: Self-pay | Admitting: Internal Medicine

## 2024-03-12 ENCOUNTER — Encounter

## 2024-03-14 ENCOUNTER — Telehealth: Payer: Self-pay | Admitting: *Deleted

## 2024-03-14 NOTE — Telephone Encounter (Signed)
 MAMMOGRAM LETTER MAILED TO PATIENT UNABLE TO REACH PATIENT BY PHONE / NEEDING MAMMOGRAM.

## 2024-03-24 ENCOUNTER — Telehealth: Payer: Self-pay | Admitting: *Deleted

## 2024-03-24 NOTE — Telephone Encounter (Signed)
 Called patient regarding her mammogram getting scheduled / unable to reach patient by phone -LVM for patinet to return call or either call the breast center directly at [phone] or contact me at the clinic at [phone].

## 2024-03-27 ENCOUNTER — Ambulatory Visit: Admitting: Allergy

## 2024-03-31 ENCOUNTER — Other Ambulatory Visit: Payer: Self-pay

## 2024-03-31 DIAGNOSIS — F32A Depression, unspecified: Secondary | ICD-10-CM

## 2024-03-31 MED ORDER — ALPRAZOLAM 0.25 MG PO TABS: ORAL_TABLET | ORAL | 0 refills | Status: DC

## 2024-04-10 ENCOUNTER — Ambulatory Visit
Admission: RE | Admit: 2024-04-10 | Discharge: 2024-04-10 | Disposition: A | Discharge status: Home / Self Care | Source: Ambulatory Visit | Attending: Internal Medicine | Admitting: Internal Medicine

## 2024-04-10 DIAGNOSIS — Z1231 Encounter for screening mammogram for malignant neoplasm of breast: Secondary | ICD-10-CM

## 2024-05-07 ENCOUNTER — Ambulatory Visit (HOSPITAL_BASED_OUTPATIENT_CLINIC_OR_DEPARTMENT_OTHER): Admitting: Orthopaedic Surgery

## 2024-05-07 ENCOUNTER — Ambulatory Visit (HOSPITAL_BASED_OUTPATIENT_CLINIC_OR_DEPARTMENT_OTHER)

## 2024-05-07 DIAGNOSIS — G8929 Other chronic pain: Secondary | ICD-10-CM

## 2024-05-07 DIAGNOSIS — M75112 Incomplete rotator cuff tear or rupture of left shoulder, not specified as traumatic: Secondary | ICD-10-CM | POA: Diagnosis not present

## 2024-05-07 DIAGNOSIS — M19012 Primary osteoarthritis, left shoulder: Secondary | ICD-10-CM | POA: Diagnosis not present

## 2024-05-07 DIAGNOSIS — M25469 Effusion, unspecified knee: Secondary | ICD-10-CM | POA: Diagnosis not present

## 2024-05-07 DIAGNOSIS — M25561 Pain in right knee: Secondary | ICD-10-CM

## 2024-05-07 DIAGNOSIS — Z96659 Presence of unspecified artificial knee joint: Secondary | ICD-10-CM | POA: Diagnosis not present

## 2024-05-07 DIAGNOSIS — M25569 Pain in unspecified knee: Secondary | ICD-10-CM | POA: Diagnosis not present

## 2024-05-07 DIAGNOSIS — M25512 Pain in left shoulder: Secondary | ICD-10-CM | POA: Diagnosis not present

## 2024-05-07 NOTE — Progress Notes (Signed)
 Post Operative Evaluation    Procedure/Date of Surgery: Left subacromial balloon spacer 4/9  Interval History:   05/07/2024: Presents today for follow-up of the left shoulder.  Overall she states that she got 1-1/2 years of complete relief although this is subsequently wearing off at this point.  She has hoping for subacromial injection.  With regard to the right knee she has been having predominantly start up symptoms.  The knee was revised several years prior after an initial total knee arthroplasty.  This was done in [place] but she does not remember by who.  She did have a fall on this over a year ago at which time she was told this was more consistent with a bone bruise.  Presents today for follow-up of her left shoulder subacromial spacer.  Overall she is doing very well from a left shoulder perspective.  She is continuing to work with physical therapy on the right shoulder as well.   PMH/PSH/Family History/Social History/Meds/Allergies:    Past Medical History:  Diagnosis Date   Allergy    Anxiety    Arthritis    s/p R TKR   Class 2 severe obesity due to excess calories with serious comorbidity and body mass index (BMI) of 35.0 to 35.9 in adult [facility]) 11/13/2012   COVID-19 05/04/2023   Cyst of left kidney 2015   by lumbar MRI pending renal US    DDD (degenerative disc disease), lumbar 03/2014   severe L2-3 with L HNP with L2/3 nerve root impingement [facility] @ [facility])   Depression with anxiety    Diabetes type 2, controlled (HCC) 2011   borderline   Glaucoma    History of ulcer disease    HLD (hyperlipidemia)    no meds taken   Hypertension    OAB (overactive bladder) 10/14/2019   Osteoarthritis of spine with radiculopathy, 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   Plantar fasciitis of left foot 10/01/2013   PONV (postoperative nausea and vomiting)    Primary osteoarthritis of left knee 07/11/2021   Right-sided face pain 01/07/2019   Rotator cuff tear  02/02/2017   Seasonal allergies    Sleep apnea 2022   no cpap   Trigger middle finger of right hand 11/12/2018   Vitreous degeneration, unspecified eye 10/25/2012   Past Surgical History:  Procedure Laterality Date   ARTERY BIOPSY Right 01/23/2019   Procedure: REMOVAL OF PART OF RIGHT TEMPORAL ARTERY FOR BIOPSY;  Surgeon: Sheldon Standing, MD;  Location: [facility] OR;  Service: General;  Laterality: Right;   CATARACT EXTRACTION W/ INTRAOCULAR LENS IMPLANT Bilateral    COLONOSCOPY  04/2017   TA, diverticulosis  (stark)   GLAUCOMA SURGERY     HAMMER TOE SURGERY Bilateral    LAPAROSCOPIC SUPRACERVICAL HYSTERECTOMY  2007   with BSO, cervix remains.  Fibroids.   Right hip replacement     SHOULDER ARTHROSCOPY Left 12/12/2022   Procedure: LEFT SHOULDER ARTHROSCOPY WITH BALLOON SPACER;  Surgeon: Genelle Standing, MD;  Location: [facility];  Service: Orthopedics;  Laterality: Left;   TONSILLECTOMY     TOTAL HIP ARTHROPLASTY Left 2021   TOTAL KNEE ARTHROPLASTY Right 2004   TKR (Dr. Deward with [facility])   TOTAL KNEE ARTHROPLASTY Left 07/11/2021   Procedure: LEFT TOTAL KNEE ARTHROPLASTY;  Surgeon: Jerri Kay HERO, MD;  Location: [facility] OR;  Service:  Orthopedics;  Laterality: Left;   TOTAL KNEE REVISION Right 03/2014   Dr Raynold [facility]   TOTAL SHOULDER ARTHROPLASTY Right 2022   Social History   Socioeconomic History   Marital status: Single    Spouse name: Not on file   Number of children: 0   Years of education: 16   Highest education level: Not on file  Occupational History   Occupation: work with disabilities/ Retired    Associate Professor: [employer]  Tobacco Use   Smoking status: Never    Passive exposure: Past   Smokeless tobacco: Never  Vaping Use   Vaping status: Never Used  Substance and Sexual Activity   Alcohol use: No   Drug use: No   Sexual activity: Not Currently    Birth control/protection: Surgical  Other Topics Concern   Not on file  Social History Narrative    Caffeine: rare   Lives alone   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[employer]   Fun/Hobbies: Gardening    Social Drivers of Health   Financial Resource Strain: Medium Risk (01/25/2023)   Overall Financial Resource Strain (CARDIA)    Difficulty of Paying Living Expenses: Somewhat hard  Food Insecurity: No Food Insecurity (01/25/2023)   Hunger Vital Sign    Worried About Running Out of Food in the Last Year: Never true    Ran Out of Food in the Last Year: Never true  Transportation Needs: No Transportation Needs (01/25/2023)   PRAPARE - Administrator, Civil Service (Medical): No    Lack of Transportation (Non-Medical): No  Physical Activity: Insufficiently Active (01/25/2023)   Exercise Vital Sign    Days of Exercise per Week: 2 days    Minutes of Exercise per Session: 40 min  Stress: Stress Concern Present (01/25/2023)   [employer] of Occupational Health - Occupational Stress Questionnaire    Feeling of Stress : Rather much  Social Connections: Unknown (01/25/2023)   Social Connection and Isolation Panel    Frequency of Communication with Friends and Family: Once a week    Frequency of Social Gatherings with Friends and Family: Not on file    Attends Religious Services: Not on file    Active Member of Clubs or Organizations: Not on file    Attends Engineer, structural: More than 4 times per year    Marital Status: Never married   Family History  Problem Relation Age of Onset   Cancer Mother 74       bone, MM   Diabetes Maternal Grandmother    Hypertension Maternal Grandmother    Ovarian cancer Maternal Grandmother 68   Stroke Other        unsure who   CAD Neg Hx    Colon cancer Neg Hx    Esophageal cancer Neg Hx    Rectal cancer Neg Hx    Stomach cancer Neg Hx    Breast cancer Neg Hx    BRCA 1/2 Neg Hx    Allergies  Allergen Reactions   Codeine Other (See Comments)    palpitations   Metformin  And Related Nausea Only   Oxycodone  Nausea And Vomiting   Latex Dermatitis, Itching and Rash   Current Outpatient Medications  Medication Sig Dispense Refill   Accu-Chek FastClix Lancets MISC USE TO TEST BLOOD SUGAR UP TO FOUR TIMES DAILY 306 each 1   ACCU-CHEK GUIDE test strip USE AS DIRECTED FOUR TIMES DAILY 100 strip 11   ALPRAZolam  (XANAX ) 0.25 MG tablet  TAKE 1 TABLET(0.25 MG) BY MOUTH AT BEDTIME AS NEEDED FOR SLEEP 30 tablet 0   aspirin  EC 81 MG tablet Take 81 mg by mouth daily. Swallow whole.     blood glucose meter kit and supplies KIT Dispense based on patient and insurance preference. Use up to four times daily as directed. DX Code: E11.9 1 each 0   desonide  (DESOWEN ) 0.05 % ointment Apply 1 Application topically 2 (two) times daily as needed (rash). 60 g 5   Dulaglutide  (TRULICITY ) 3 MG/0.5ML SOAJ Inject 3 mg as directed once a week. 6 mL 2   escitalopram  (LEXAPRO ) 10 MG tablet Take 1 tablet (10 mg total) by mouth daily. TAKE 1 TABLET(10 MG) BY MOUTH DAILY 90 tablet 3   esomeprazole (NEXIUM) 20 MG capsule Take 20 mg by mouth once a week.     hydroquinone  4 % cream Apply topically 2 (two) times daily. 28.35 g 0   olmesartan  (BENICAR ) 40 MG tablet Take 1 tablet (40 mg total) by mouth daily. 30 tablet 11   omeprazole  (PRILOSEC) 20 MG capsule Take 1 capsule (20 mg total) by mouth daily. 30 capsule 0   VYZULTA 0.024 % SOLN Place 1 drop into both eyes at bedtime.     No current facility-administered medications for this visit.   No results found.  Review of Systems:   A ROS was performed including pertinent positives and negatives as documented in the HPI.   Musculoskeletal Exam:    There were no vitals taken for this visit.  Left shoulder with well-healed.  Active forward elevation is to 140 degrees with external rotation at the side to 50 degrees.  Internal rotation is to L3.  Strength is much improved  Imaging:    4 views right knee x-ray: There is lucency under the lateral tibial plateau which does appear to  to be stable  I personally reviewed and interpreted the radiographs.   Assessment:   Status post left shoulder subacromial balloon spacer now with recurrence of symptoms.  I did describe that we could bring her back for a left subacromial injection which she would like to do.  With regard to the right knee there does appear to be lucency possibly consistent with aseptic loosening.  At this time I would like to get a second opinion with Dr. Vernetta as I do ultimately believe that this may need revision Plan :    -Return to clinic for left shoulder injection      I personally saw and evaluated the patient, and participated in the management and treatment plan.  Elspeth Parker, MD Attending Physician, Orthopedic Surgery  This document was dictated using Dragon voice recognition software. A reasonable attempt at proof reading has been made to minimize errors.

## 2024-05-08 ENCOUNTER — Encounter: Payer: Self-pay | Admitting: Podiatry

## 2024-05-08 ENCOUNTER — Other Ambulatory Visit: Payer: Self-pay | Admitting: Podiatry

## 2024-05-08 ENCOUNTER — Ambulatory Visit: Admitting: Podiatry

## 2024-05-08 DIAGNOSIS — M79671 Pain in right foot: Secondary | ICD-10-CM

## 2024-05-08 DIAGNOSIS — M79672 Pain in left foot: Secondary | ICD-10-CM

## 2024-05-08 DIAGNOSIS — M2041 Other hammer toe(s) (acquired), right foot: Secondary | ICD-10-CM

## 2024-05-08 DIAGNOSIS — L600 Ingrowing nail: Secondary | ICD-10-CM

## 2024-05-08 DIAGNOSIS — B351 Tinea unguium: Secondary | ICD-10-CM

## 2024-05-08 MED ORDER — TERBINAFINE HCL 250 MG PO TABS: 250.0000 mg | ORAL_TABLET | Freq: Every day | ORAL | 1 refills | Status: DC

## 2024-05-08 MED ORDER — TERBINAFINE HCL 250 MG PO TABS: 250.0000 mg | ORAL_TABLET | Freq: Every day | ORAL | 0 refills | Status: DC

## 2024-05-08 NOTE — Addendum Note (Signed)
 Addended by: GERRIT ANDREZ CROME on: 05/08/2024 02:57 PM   Modules accepted: Orders

## 2024-05-08 NOTE — Progress Notes (Signed)
 Patient presents for evaluation and treatment of tenderness and some redness around nails feet.  Tenderness around toes with walking and wearing shoes.  Has a considerable amount of pain around the medial border of the left hallux nail and the right lateral border on the hallux nail.  These get ingrown and painful she has been having problems with them for years.  Also complains of pain in the  right second  toe says the toe gets twisted  and painful.  Complains of pain around the PIPJ of the second toe.  Physical exam:  General appearance: Alert, pleasant, and in no acute distress.  Vascular: Pedal pulses: DP 2/4 B/L, PT 2/4 B/L. Mild edema lower legs bilaterally  Neu  Dermatologic:  Ingrown medial nail border hallux left and right hypertrophy of the nail fold and redness along the nail fold.  Nails thickened, disfigured, discolored 1-5 BL with subungual debris.  Redness and hypertrophic nail folds along nail folds bilaterally but no signs of drainage or infection.  Musculoskeletal:  Hammertoe second toe right with tenderness around the proximal inter phalangeal joint.  There is instability at the PIPJ.  Tenderness distal hallux bilaterally.   Diagnosis: 1. Painful onychomycotic nails 1 through 5 bilaterally. 2. Pain toes 1 through 5 bilaterally. 3.  Ingrown nail hallux bilaterally 4.  Painful hammertoe deformity second toe right  Plan: -New patient office visit for evaluation and management level 3.  Modifier 25. - Discussed with patient the ingrown nail border on the medial hallux left and the lateral border hallux right.  Recommended matrixectomy of any nail problems she is having with them.  We can do this on her next visit.  Also discussed the hammertoe deformity.  Discussed etiology and treatment.  If this continues to bother her ,we can discuss surgery on it.  For now just recommended wearing a good comfortable shoe with good room in the toes -Discussed the onychomycosis 1 through  5 bilaterally.  Discussed treatment options including periodic debridement, topical antifungal compounds, and oral antifungal compounds.  She would like to do the oral Lamisil  for treatment.  Discussed possibility of liver toxicity with this.  Will recheck the labs intermittently.  Treatment to monitor for  any liver complications.  -Rx Lamisil  250 mg, 1 p.o. daily, 1 refill -Order for labs LFTs.  -Debrided onychomycotic nails 1 through 5 bilaterally.  Sharply debrided nails with nail clipper and reduced with a power bur.  Return 2 weeks for radiographs 3 views right foot and matrixectomy medial border hallux nail left, then 2 more weeks for lesion lateral border hallux nail right.  Return in 6 weeks for Lamisil  3 and blood work

## 2024-05-09 LAB — HEPATIC FUNCTION PANEL
ALT: 13 IU/L (ref 0–32)
AST: 13 IU/L (ref 0–40)
Albumin: 4 g/dL (ref 3.8–4.8)
Alkaline Phosphatase: 126 IU/L — ABNORMAL HIGH (ref 44–121)
Bilirubin Total: 0.3 mg/dL (ref 0.0–1.2)
Bilirubin, Direct: 0.09 mg/dL (ref 0.00–0.40)
Total Protein: 7.3 g/dL (ref 6.0–8.5)

## 2024-05-13 ENCOUNTER — Other Ambulatory Visit: Payer: Self-pay

## 2024-05-13 DIAGNOSIS — F32A Depression, unspecified: Secondary | ICD-10-CM

## 2024-05-13 MED ORDER — ALPRAZOLAM 0.25 MG PO TABS: ORAL_TABLET | ORAL | 0 refills | Status: DC

## 2024-05-21 ENCOUNTER — Encounter: Payer: Self-pay | Admitting: Internal Medicine

## 2024-05-21 ENCOUNTER — Ambulatory Visit (HOSPITAL_BASED_OUTPATIENT_CLINIC_OR_DEPARTMENT_OTHER): Admitting: Student

## 2024-05-22 ENCOUNTER — Other Ambulatory Visit (HOSPITAL_BASED_OUTPATIENT_CLINIC_OR_DEPARTMENT_OTHER): Payer: Self-pay

## 2024-05-22 MED ORDER — COMIRNATY 30 MCG/0.3ML IM SUSY
0.3000 mL | PREFILLED_SYRINGE | Freq: Once | INTRAMUSCULAR | 0 refills | Status: AC
Start: 2024-05-22 — End: 2024-05-23
  Filled 2024-05-22: qty 0.3, 1d supply, fill #0

## 2024-05-23 ENCOUNTER — Ambulatory Visit: Admitting: Podiatry

## 2024-05-27 ENCOUNTER — Ambulatory Visit (HOSPITAL_BASED_OUTPATIENT_CLINIC_OR_DEPARTMENT_OTHER): Admitting: Student

## 2024-05-29 ENCOUNTER — Encounter: Admitting: Student

## 2024-06-03 ENCOUNTER — Encounter (HOSPITAL_BASED_OUTPATIENT_CLINIC_OR_DEPARTMENT_OTHER): Payer: Self-pay

## 2024-06-03 ENCOUNTER — Ambulatory Visit (HOSPITAL_BASED_OUTPATIENT_CLINIC_OR_DEPARTMENT_OTHER): Admitting: Student

## 2024-06-04 ENCOUNTER — Ambulatory Visit (INDEPENDENT_AMBULATORY_CARE_PROVIDER_SITE_OTHER): Admitting: Orthopaedic Surgery

## 2024-06-04 DIAGNOSIS — M25561 Pain in right knee: Secondary | ICD-10-CM | POA: Diagnosis not present

## 2024-06-04 DIAGNOSIS — Z96651 Presence of right artificial knee joint: Secondary | ICD-10-CM | POA: Diagnosis not present

## 2024-06-04 DIAGNOSIS — G8929 Other chronic pain: Secondary | ICD-10-CM

## 2024-06-04 NOTE — Progress Notes (Signed)
 The patient is a 73 year old female sent to me by my partner Dr. Genelle due to a painful right total knee arthroplasty.  This knee was replaced years ago elsewhere.  She is also had both of her her hips replaced.  These were done elsewhere.  My partner Dr. Jerri has replaced her left knee.  That knee has done well.  She has also had a shoulder replacement by my other partner.  She had a mechanical fall last summer and she has had right knee pain and some instability symptoms since then.  Recent x-rays were concerning about the potential for prosthetic loosening so she was sent to me to assess this.  I did talk to her about seeing my other partner who had done her left knee replacement but she said she would see me for this knee today.  Examination of her right knee today does show a mild effusion.  It is slightly warm.  There is pain throughout the arc of motion of the knee.  There is no evidence of infection that I can see on clinical exam.  X-rays of the right knee on the canopy system show questioning loosening of the tibial component.  I would like to send her for a three-phase bone scan to rule out the loosening of the right knee replacement.  She is dealing with a lot of things in her life with someone in the hospital and working jobs.  She would like to hold off on surgery for a few months.  With that being said we will see her back in 4 weeks to go over the three-phase bone scan and at that visit I may consider obtaining baseline labs such as a white blood cell count, CRP and sed rate as well as aspirate the knee.  However this sounds like it is a chronic issue and not an issue of infection but more of a septic loosening.  She agrees with this treatment plan.  Will see her back in a month.  We will order a three-phase bone scan as well.

## 2024-06-05 ENCOUNTER — Other Ambulatory Visit: Payer: Self-pay

## 2024-06-05 DIAGNOSIS — G8929 Other chronic pain: Secondary | ICD-10-CM

## 2024-06-05 DIAGNOSIS — Z96651 Presence of right artificial knee joint: Secondary | ICD-10-CM

## 2024-06-06 ENCOUNTER — Ambulatory Visit: Admitting: Podiatry

## 2024-06-11 ENCOUNTER — Ambulatory Visit: Admitting: Dermatology

## 2024-06-11 ENCOUNTER — Other Ambulatory Visit: Payer: Self-pay

## 2024-06-11 DIAGNOSIS — F32A Depression, unspecified: Secondary | ICD-10-CM

## 2024-06-12 ENCOUNTER — Encounter (HOSPITAL_COMMUNITY)
Admission: RE | Admit: 2024-06-12 | Discharge: 2024-06-12 | Disposition: A | Discharge status: Home / Self Care | Source: Ambulatory Visit | Attending: Orthopaedic Surgery | Admitting: Orthopaedic Surgery

## 2024-06-12 DIAGNOSIS — G8929 Other chronic pain: Secondary | ICD-10-CM | POA: Insufficient documentation

## 2024-06-12 DIAGNOSIS — M25561 Pain in right knee: Secondary | ICD-10-CM | POA: Insufficient documentation

## 2024-06-12 DIAGNOSIS — Z96653 Presence of artificial knee joint, bilateral: Secondary | ICD-10-CM | POA: Diagnosis not present

## 2024-06-12 DIAGNOSIS — Z96651 Presence of right artificial knee joint: Secondary | ICD-10-CM | POA: Insufficient documentation

## 2024-06-12 DIAGNOSIS — Z471 Aftercare following joint replacement surgery: Secondary | ICD-10-CM | POA: Diagnosis not present

## 2024-06-12 MED ORDER — TECHNETIUM TC 99M MEDRONATE IV KIT
20.0000 | PACK | Freq: Once | INTRAVENOUS | Status: AC | PRN
  Administered 2024-06-12: 21.7 via INTRAVENOUS

## 2024-06-12 MED ORDER — ALPRAZOLAM 0.25 MG PO TABS: ORAL_TABLET | ORAL | 0 refills | Status: DC

## 2024-06-23 ENCOUNTER — Ambulatory Visit (INDEPENDENT_AMBULATORY_CARE_PROVIDER_SITE_OTHER): Admitting: Podiatry

## 2024-06-23 DIAGNOSIS — L600 Ingrowing nail: Secondary | ICD-10-CM

## 2024-06-23 NOTE — Progress Notes (Signed)
 At patient presents today for ingrown nails on the hallux bilaterally.  She was originally scheduled for matricectomy on the hallux right today however states she would like to hold off because she has some work duties to take care of today.  She also has a new complaint of ingrown nails that have been giving her trouble on the 3rd and 5th toes on the left.  Wonders what to do with these.  Says the hammertoes are not bothering her as much right now so she would like to hold off on radiographs.   Physical exam:  General appearance: Pleasant, and in no acute distress. AOx3.  Vascular: Pedal pulses: DP 2/4 bilaterally, PT 2/4 bilaterally.  Mild edema lower legs bilaterally. Capillary fill time immediate bilaterally.  Neurological:  Grossly intact bilaterally  Dermatologic:   Thick dystrophic ingrown nails with tenderness hallux bilaterally and 3rd and 5th toes left.  Skin normal temperature bilaterally.  Skin normal color, tone, and texture bilaterally.   Musculoskeletal: Hammertoes 2 through 5 bilaterally.    Diagnosis: 1.  Ingrown nails hallux bilaterally 3rd and 5th toes left.  Plan: -Established office visit for evaluation and management level 3. - Discussed with her the ingrown nails and she would like to have the 3rd and 5th nails on the left foot taken care of also.  Will wait her with another week and do the matricectomy total hallux right, then do the hallux nail left, and then do the 3rd and 5th toes.  -Return in 1 week for matrixectomy hallux nail right, then hallux nail left, then nails 3 and 5 the left

## 2024-06-26 DIAGNOSIS — M47816 Spondylosis without myelopathy or radiculopathy, lumbar region: Secondary | ICD-10-CM | POA: Diagnosis not present

## 2024-07-01 ENCOUNTER — Ambulatory Visit (INDEPENDENT_AMBULATORY_CARE_PROVIDER_SITE_OTHER): Admitting: Podiatry

## 2024-07-01 DIAGNOSIS — L600 Ingrowing nail: Secondary | ICD-10-CM | POA: Diagnosis not present

## 2024-07-01 NOTE — Patient Instructions (Signed)

## 2024-07-01 NOTE — Progress Notes (Signed)
 Patient complains of painful ingrown thickened hallux nail right.  Pain with walking and wearing shoes around the edges of the nail and distal hallux.. Patient denies fevers, chills, nausea, vomiting.  Objective:  Vitals: Reviewed  General: Well developed, nourished, in no acute distress, alert and oriented x3   Vascular: DP pulse 2/4 bilateral. PT pulse 2/4 bilateral.  Mild edema bilaterally  Dermatology: Erythema, edema, incurvated dystrophic nail hallux right with no drainage . Tenderness present with palpation. Normal skin tone and texture feet with normal hair growth.  Neurological: Grossly intact. Normal reflexes.   Musculoskeletal: Tenderness with palpation of the distal hallux right. No tenderness or painful ROM at IPJ.  Diagnosis: Ingrown nail hallux  right  Plan: -discussed etiology and treatment of ingrown nails. Discussed surgical vs conservative treatment. -Consent signed for appropriate matrixectomy affected nail(s).   Procedure(s):   - Matrixectomy(s) total nail hallux right: Toe anesthetized with 3cc 2:1 mixture 2% Lidocaine  with epinephrine : Sodium Bicarbonate. Surgical site prepped. Digital tourniquet applied.  Avulsion of nail plate. performed. Matrixecomy performed with three 30 second applications of phenol to nail matrix. Site irrigated with alcohol.  Tourniquet released with good vascularity noticed in digit.  Applied triple antibiotic to nailbed and applied gauze and Coban dressing. - Written and oral postoperative instructions given.  -Return for post-op 2 weeks and matricectomy hallux nail left, then 3rd and 5th nails left   J Prentice Binder, DPM

## 2024-07-02 ENCOUNTER — Encounter: Payer: Self-pay | Admitting: Orthopaedic Surgery

## 2024-07-02 ENCOUNTER — Ambulatory Visit (INDEPENDENT_AMBULATORY_CARE_PROVIDER_SITE_OTHER): Admitting: Orthopaedic Surgery

## 2024-07-02 DIAGNOSIS — Z96651 Presence of right artificial knee joint: Secondary | ICD-10-CM | POA: Diagnosis not present

## 2024-07-02 DIAGNOSIS — Z96652 Presence of left artificial knee joint: Secondary | ICD-10-CM

## 2024-07-02 DIAGNOSIS — M25561 Pain in right knee: Secondary | ICD-10-CM | POA: Diagnosis not present

## 2024-07-02 DIAGNOSIS — G8929 Other chronic pain: Secondary | ICD-10-CM | POA: Diagnosis not present

## 2024-07-02 DIAGNOSIS — T84018D Broken internal joint prosthesis, other site, subsequent encounter: Secondary | ICD-10-CM

## 2024-07-02 DIAGNOSIS — Z96659 Presence of unspecified artificial knee joint: Secondary | ICD-10-CM

## 2024-07-02 NOTE — Progress Notes (Addendum)
 The patient is a 73 year old who comes in for follow-up after having a three-phase bone scan as a relates to a painful right total knee arthroplasty.  This knee was revised at [facility] at their [facility] facility several years ago with just upsizing her poly-liner due to instability.  My partner Dr. Jerri has replaced her left knee and that knee has done great.  She was having some knee issues with the right knee hurting and occasionally swelling of that knee and then fell more recently.  X-rays were obtained of the knee which were potentially suggestive of loosening of the tibial tray.  We sent her for a three-phase bone scan to rule out any type of stress fracture that we could not see and see if there was any evidence of prosthetic loosening.  She says his knee still hurts and swells on occasion.  She denies any fever and chills.  She is a diabetic but has good blood glucose control.  On exam her right knee does have a little more valgus malalignment than the left knee.  The left knee is much more straight.  It feels stable on exam but there is some clicking in the right knee.  The three-phase bone scan shows no evidence of loosening.  He does not light up around either knee at all.  I did go over knee replacement model and we talked about options of either just letting this go and watching it closely versus some type of revision surgery.  She understands that it painful knee replacements can be hard to treat.  We could open her knee and find no evidence of loosening and we would upsize her poly liner and remove scar tissue.  We may find infection or we may find evidence of components that are loose that with need revising.  I explained in detail how all of those things can go on.  She could still even have an infection after surgery and she needs to fully understand the risk and benefits of any type of further surgery to the knee.  However, given the fact that her right knee continues to hurt and is  detriment affecting her mobility, quality of life and actives of daily living, I feel it is appropriate to proceed at this point with the revision surgery based on the worsening on clinical exam and plain film findings of that knee.  She would like to have this done in the latter part of January which is 3 months from now.  All question concerns were answered and addressed.  We will work on getting her scheduled for a right knee revision arthroplasty.

## 2024-07-07 ENCOUNTER — Encounter: Payer: Self-pay | Admitting: Radiology

## 2024-07-08 ENCOUNTER — Other Ambulatory Visit: Payer: Self-pay

## 2024-07-08 DIAGNOSIS — F32A Depression, unspecified: Secondary | ICD-10-CM

## 2024-07-14 ENCOUNTER — Encounter: Payer: Self-pay | Admitting: Podiatry

## 2024-07-14 MED ORDER — ALPRAZOLAM 0.25 MG PO TABS: ORAL_TABLET | ORAL | 0 refills | Status: DC

## 2024-07-16 NOTE — Telephone Encounter (Signed)
I called patient and left voice mail for return call to discuss scheduling.

## 2024-08-15 ENCOUNTER — Encounter: Payer: Self-pay | Admitting: Emergency Medicine

## 2024-08-15 ENCOUNTER — Ambulatory Visit: Admission: EM | Admit: 2024-08-15 | Discharge: 2024-08-15 | Disposition: A | Discharge status: Home / Self Care

## 2024-08-15 ENCOUNTER — Ambulatory Visit: Admitting: Podiatry

## 2024-08-15 DIAGNOSIS — J029 Acute pharyngitis, unspecified: Secondary | ICD-10-CM

## 2024-08-15 DIAGNOSIS — Z79899 Other long term (current) drug therapy: Secondary | ICD-10-CM | POA: Insufficient documentation

## 2024-08-15 NOTE — ED Triage Notes (Signed)
 Pt reports sore throat and bilateral ear fullness x3 days. Denies fevers or chills. Notes dry cough, difficulty swallowing,  and intermittent headache as well. Denies popping sensation in ears with swallowing. Denies abd pain, N/V, and diarrhea. Taking tylenol , theraflu, and cough drops with no relief. No known sick contacts, but she does work part-time in a group home.

## 2024-08-15 NOTE — ED Provider Notes (Signed)
 [name]    CSN: [id number] Arrival date & time: 08/15/24  1025      History   Chief Complaint Chief Complaint  Patient presents with   Sore Throat   Ear Fullness    HPI Melissa [name] is a 73 y.o. female.   Patient presents today due to 3 days worth of throat pain and ear fullness.  Patient denies fever, chills, nausea, vomiting.  Patient states that she has been using cough drops and OTC cold medications without significant relief.  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  The history is provided by the patient.  Sore Throat  Ear Fullness    Past Medical History:  Diagnosis Date   Allergy    Anxiety    Arthritis    s/p R TKR   Class 2 severe obesity due to excess calories with serious comorbidity and body mass index (BMI) of 35.0 to 35.9 in adult 11/13/2012   COVID-19 05/04/2023   Cyst of left kidney 2015   by lumbar MRI pending renal US    DDD (degenerative disc disease), lumbar 03/2014   severe L2-3 with L HNP with L2/3 nerve root impingement [facility] @ [facility])   Depression with anxiety    Diabetes type 2, controlled (HCC) 2011   borderline   Glaucoma    History of ulcer disease    HLD (hyperlipidemia)    no meds taken   Hypertension    OAB (overactive bladder) 10/14/2019   Osteoarthritis of spine with radiculopathy, 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   Plantar fasciitis of left foot 10/01/2013   PONV (postoperative nausea and vomiting)    Primary osteoarthritis of left knee 07/11/2021   Right-sided face pain 01/07/2019   Rotator cuff tear 02/02/2017   Seasonal allergies    Sleep apnea 2022   no cpap   Trigger middle finger of right hand 11/12/2018   Vitreous degeneration, unspecified eye 10/25/2012    Patient Active Problem List   Diagnosis Date Noted   Long term current use of therapeutic drug 08/15/2024   Intermittent palpitations 12/03/2023   Lichenification 12/03/2023   Vitamin D  deficiency  08/13/2023   Rosacea, unspecified 02/06/2023   Nontraumatic incomplete tear of left rotator cuff 12/12/2022   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   Chronic insomnia 10/12/2022   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   Decreased functional mobility 02/01/2022   History of colonic polyps 01/26/2022   History of total knee replacement, bilateral 07/11/2021   Sleep apnea, intolerant of CPAP, declines 05/24/2021   Healthcare maintenance 01/07/2019   Chronic left shoulder pain 01/08/2018   History of bilateral hip replacements 07/19/2017   Acquired renal cyst of left kidney 01/01/2016   Type 2 diabetes mellitus without complications (HCC) 12/15/2014   Spondylosis without myelopathy or radiculopathy, 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/2014   Class 2 severe obesity due to excess calories with serious comorbidity and body mass index (BMI) of 35.0 to 35.9 in adult 11/13/2012   Glaucoma    Anxiety and depression    HTN (hypertension) 01/06/2012   GERD (gastroesophageal reflux disease) 01/06/2012   Hypercholesteremia 01/06/2012    Past Surgical History:  Procedure Laterality Date   ARTERY BIOPSY Right 01/23/2019   Procedure: REMOVAL OF PART OF RIGHT TEMPORAL ARTERY FOR BIOPSY;  Surgeon: Sheldon Standing, MD;  Location: [facility] OR;  Service: General;  Laterality: Right;   CATARACT EXTRACTION W/ INTRAOCULAR LENS IMPLANT Bilateral    COLONOSCOPY  04/2017   TA, diverticulosis  ([facility])   GLAUCOMA SURGERY     HAMMER TOE SURGERY Bilateral    LAPAROSCOPIC SUPRACERVICAL HYSTERECTOMY  2007   with BSO, cervix remains.  Fibroids.   Right hip replacement     SHOULDER ARTHROSCOPY Left 12/12/2022   Procedure: LEFT SHOULDER ARTHROSCOPY WITH BALLOON SPACER;  Surgeon: Genelle Standing, MD;  Location: [facility];  Service: Orthopedics;  Laterality: Left;   TONSILLECTOMY     TOTAL HIP ARTHROPLASTY Left 2021   TOTAL KNEE ARTHROPLASTY Right 2004   TKR (Dr. Deward with [facility])   TOTAL KNEE  ARTHROPLASTY Left 07/11/2021   Procedure: LEFT TOTAL KNEE ARTHROPLASTY;  Surgeon: Jerri Kay HERO, MD;  Location: [facility] OR;  Service: Orthopedics;  Laterality: Left;   TOTAL KNEE REVISION Right 03/2014   Dr Raynold [facility]   TOTAL SHOULDER ARTHROPLASTY Right 2022    OB History     Gravida  0   Para      Term      Preterm      AB      Living  0      SAB      IAB      Ectopic      Multiple      Live Births               Home Medications    Prior to Admission medications  Medication Sig Start Date End Date Taking? Authorizing Provider  Accu-Chek FastClix Lancets MISC USE TO TEST BLOOD SUGAR UP TO FOUR TIMES DAILY 11/25/18  Yes Dragnev, Romualdo Capers, NP  ACCU-CHEK GUIDE test strip USE AS DIRECTED FOUR TIMES DAILY 06/28/20  Yes Norleen Lynwood ORN, MD  ALPRAZolam  (XANAX ) 0.25 MG tablet TAKE 1 TABLET(0.25 MG) BY MOUTH AT BEDTIME AS NEEDED FOR SLEEP 07/14/24  Yes Trudy Mliss Dragon, MD  aspirin  EC 81 MG tablet Take 81 mg by mouth daily. Swallow whole.   Yes [provider]  blood glucose meter kit and supplies KIT Dispense based on patient and insurance preference. Use up to four times daily as directed. DX Code: E11.9 02/11/18  Yes Dragnev, Romualdo Capers, NP  desonide  (DESOWEN ) 0.05 % ointment Apply 1 Application topically 2 (two) times daily as needed (rash). 08/15/23  Yes Amoako, Prince, MD  Dulaglutide  (TRULICITY ) 3 MG/0.5ML SOAJ Inject 3 mg as directed once a week. 02/18/24  Yes Tobie Gaines, DO  escitalopram  (LEXAPRO ) 10 MG tablet Take 1 tablet (10 mg total) by mouth daily. TAKE 1 TABLET(10 MG) BY MOUTH DAILY 11/08/23 11/02/24 Yes Trudy Mliss Dragon, MD  esomeprazole (NEXIUM) 20 MG capsule Take 20 mg by mouth once a week.   Yes [provider]  hydroquinone  4 % cream Apply topically 2 (two) times daily. 11/30/23  Yes Trudy Mliss Dragon, MD  olmesartan  (BENICAR ) 40 MG tablet Take 1 tablet (40 mg total) by mouth daily. 11/29/23 11/28/24 Yes Trudy Mliss Dragon,  MD  omeprazole  (PRILOSEC) 20 MG capsule Take 1 capsule (20 mg total) by mouth daily. 02/06/24  Yes Palumbo, April, MD  VYZULTA 0.024 % SOLN Place 1 drop into both eyes at bedtime. 05/16/21  Yes [provider]    Family History Family History  Problem Relation Age of Onset   Cancer Mother 10       bone, MM   Diabetes Maternal Grandmother    Hypertension Maternal Grandmother    Ovarian cancer Maternal Grandmother 24   Stroke Other  unsure who   CAD Neg Hx    Colon cancer Neg Hx    Esophageal cancer Neg Hx    Rectal cancer Neg Hx    Stomach cancer Neg Hx    Breast cancer Neg Hx    BRCA 1/2 Neg Hx     Social History Social History[1]   Allergies   Codeine, Metformin  and related, Oxycodone, and Latex   Review of Systems Review of Systems   Physical Exam Triage Vital Signs ED Triage Vitals  Encounter Vitals Group     BP 08/15/24 1111 (!) 180/110     Girls Systolic BP Percentile --      Girls Diastolic BP Percentile --      Boys Systolic BP Percentile --      Boys Diastolic BP Percentile --      Pulse Rate 08/15/24 1111 79     Resp 08/15/24 1111 16     Temp 08/15/24 1111 98.6 F (37 C)     Temp Source 08/15/24 1111 Oral     SpO2 08/15/24 1111 95 %     Weight --      Height --      Head Circumference --      Peak Flow --      Pain Score 08/15/24 1107 8     Pain Loc --      Pain Education --      Exclude from Growth Chart --    No data found.  Updated Vital Signs BP (!) 180/110 (BP Location: Right Arm) Comment: pt has not taken bp meds today and also states PCP is currently looking at adding new meds  Pulse 79   Temp 98.6 F (37 C) (Oral)   Resp 16   SpO2 95%   Visual Acuity Right Eye Distance:   Left Eye Distance:   Bilateral Distance:    Right Eye Near:   Left Eye Near:    Bilateral Near:     Physical Exam Vitals and nursing note reviewed.  Constitutional:      General: She is not in acute distress.    Appearance: Normal  appearance. She is not ill-appearing, toxic-appearing or diaphoretic.  HENT:     Nose: Congestion (mildly enlarged turbinates) present. No rhinorrhea.     Mouth/Throat:     Mouth: Mucous membranes are moist.     Pharynx: Oropharynx is clear. Posterior oropharyngeal erythema present. No oropharyngeal exudate.  Eyes:     General: No scleral icterus. Cardiovascular:     Rate and Rhythm: Normal rate and regular rhythm.     Heart sounds: Normal heart sounds.  Pulmonary:     Effort: Pulmonary effort is normal. No respiratory distress.     Breath sounds: Normal breath sounds. No wheezing or rhonchi.  Musculoskeletal:     Cervical back: Tenderness present.  Lymphadenopathy:     Cervical: No cervical adenopathy.  Skin:    General: Skin is warm.  Neurological:     Mental Status: She is alert and oriented to person, place, and time.  Psychiatric:        Mood and Affect: Mood normal.        Behavior: Behavior normal.      UC Treatments / Results  Labs (all labs ordered are listed, but only abnormal results are displayed) Labs Reviewed - No data to display  EKG   Radiology No results found.  Procedures Procedures (including critical care time)  Medications Ordered in UC Medications -  No data to display  Initial Impression / Assessment and Plan / UC Course  I have reviewed the triage vital signs and the nursing notes.  Pertinent labs & imaging results that were available during my care of the patient were reviewed by me and considered in my medical decision making (see chart for details).     Final Clinical Impressions(s) / UC Diagnoses   Final diagnoses:  Acute pharyngitis, unspecified etiology     Discharge Instructions        You may use ibuprofen and/or Tylenol  as needed for pain and fever.  Chloraseptic and Cepacol make a numbing throat lozenges that can be obtained over-the-counter that is helpful for throat pain as well. You may also use warm salt water  gargles  for throat pain.    ED Prescriptions   None    PDMP not reviewed this encounter.    [1]  Social History Tobacco Use   Smoking status: Never    Passive exposure: Past   Smokeless tobacco: Never  Vaping Use   Vaping status: Never Used  Substance Use Topics   Alcohol use: No   Drug use: No     Andra Corean BROCKS, PA-C 08/15/24 1137

## 2024-08-15 NOTE — Discharge Instructions (Signed)
 You may use ibuprofen and/or Tylenol  as needed for pain and fever.  Chloraseptic and Cepacol make a numbing throat lozenges that can be obtained over-the-counter that is helpful for throat pain as well. You may also use warm salt water  gargles for throat pain.

## 2024-08-20 ENCOUNTER — Ambulatory Visit (INDEPENDENT_AMBULATORY_CARE_PROVIDER_SITE_OTHER)

## 2024-08-20 ENCOUNTER — Telehealth

## 2024-08-20 VITALS — Ht 65.0 in | Wt 210.0 lb

## 2024-08-20 DIAGNOSIS — Z0000 Encounter for general adult medical examination without abnormal findings: Secondary | ICD-10-CM

## 2024-08-20 DIAGNOSIS — J019 Acute sinusitis, unspecified: Secondary | ICD-10-CM

## 2024-08-20 DIAGNOSIS — B9689 Other specified bacterial agents as the cause of diseases classified elsewhere: Secondary | ICD-10-CM

## 2024-08-20 MED ORDER — AMOXICILLIN-POT CLAVULANATE 875-125 MG PO TABS: 1.0000 | ORAL_TABLET | Freq: Two times a day (BID) | ORAL | 0 refills | Status: AC

## 2024-08-20 MED ORDER — BENZONATATE 100 MG PO CAPS: 100.0000 mg | ORAL_CAPSULE | Freq: Three times a day (TID) | ORAL | 0 refills | Status: DC | PRN

## 2024-08-20 NOTE — Patient Instructions (Addendum)
 [name], thank you for joining Chiquita CHRISTELLA Barefoot, NP for today's virtual visit.  While this provider is not your primary care provider (PCP), if your PCP is located in our provider database this encounter information will be shared with them immediately following your visit.   A [facility] MyChart account gives you access to today's visit and all your visits, tests, and labs performed at [facility]  click here if you don't have a [facility] MyChart account or go to [email or web address]  Consent: (Patient) Melissa [name] provided verbal consent for this virtual visit at the beginning of the encounter.  Current Medications:  Current Outpatient Medications:    amoxicillin -clavulanate (AUGMENTIN ) 875-125 MG tablet, Take 1 tablet by mouth 2 (two) times daily for 7 days., Disp: 14 tablet, Rfl: 0   benzonatate  (TESSALON ) 100 MG capsule, Take 1-2 capsules (100-200 mg total) by mouth 3 (three) times daily as needed for cough., Disp: 30 capsule, Rfl: 0   Accu-Chek FastClix Lancets MISC, USE TO TEST BLOOD SUGAR UP TO FOUR TIMES DAILY, Disp: 306 each, Rfl: 1   ACCU-CHEK GUIDE test strip, USE AS DIRECTED FOUR TIMES DAILY, Disp: 100 strip, Rfl: 11   ALPRAZolam  (XANAX ) 0.25 MG tablet, TAKE 1 TABLET(0.25 MG) BY MOUTH AT BEDTIME AS NEEDED FOR SLEEP, Disp: 30 tablet, Rfl: 0   aspirin  EC 81 MG tablet, Take 81 mg by mouth daily. Swallow whole., Disp: , Rfl:    blood glucose meter kit and supplies KIT, Dispense based on patient and insurance preference. Use up to four times daily as directed. DX Code: E11.9, Disp: 1 each, Rfl: 0   desonide  (DESOWEN ) 0.05 % ointment, Apply 1 Application topically 2 (two) times daily as needed (rash)., Disp: 60 g, Rfl: 5   Dulaglutide  (TRULICITY ) 3 MG/0.5ML SOAJ, Inject 3 mg as directed once a week., Disp: 6 mL, Rfl: 2   escitalopram  (LEXAPRO ) 10 MG tablet, Take 1 tablet (10 mg total) by mouth daily. TAKE 1 TABLET(10 MG) BY MOUTH DAILY, Disp: 90  tablet, Rfl: 3   esomeprazole (NEXIUM) 20 MG capsule, Take 20 mg by mouth once a week., Disp: , Rfl:    hydroquinone  4 % cream, Apply topically 2 (two) times daily., Disp: 28.35 g, Rfl: 0   olmesartan  (BENICAR ) 40 MG tablet, Take 1 tablet (40 mg total) by mouth daily., Disp: 30 tablet, Rfl: 11   omeprazole  (PRILOSEC) 20 MG capsule, Take 1 capsule (20 mg total) by mouth daily., Disp: 30 capsule, Rfl: 0   VYZULTA 0.024 % SOLN, Place 1 drop into both eyes at bedtime., Disp: , Rfl:    Medications ordered in this encounter:  Meds ordered this encounter  Medications   amoxicillin -clavulanate (AUGMENTIN ) 875-125 MG tablet    Sig: Take 1 tablet by mouth 2 (two) times daily for 7 days.    Dispense:  14 tablet    Refill:  0    Supervising Provider:   LAMPTEY, PHILIP O [[id number]]   benzonatate  (TESSALON ) 100 MG capsule    Sig: Take 1-2 capsules (100-200 mg total) by mouth 3 (three) times daily as needed for cough.    Dispense:  30 capsule    Refill:  0    Supervising Provider:   BLAISE ALEENE KIDD [[id number]]     *If you need refills on other medications prior to your next appointment, please contact your pharmacy*  Follow-Up: Call back or seek an in-person evaluation if the symptoms worsen or if the condition  fails to improve as anticipated.  [facility] [phone]  Other Instructions - Discussed OTC management -take medications as directed  - May use tylenol  as needed for pain, fevers, body aches - Salt water  gargles - Hydration and voice rest - Seek in person evaluation if worsening or fails to improve   If you have been instructed to have an in-person evaluation today at a local Urgent Care facility, please use the link below. It will take you to a list of all of our available [facility], including address, phone number and hours of operation. Please do not delay care.  [facility]  If you or a family member do not have a primary care  provider, use the link below to schedule a visit and establish care. When you choose a [facility] primary care physician or advanced practice provider, you gain a long-term partner in health. Find a Primary Care Provider  Learn more about [facility]'s in-office and virtual care options:  - Get Care Now

## 2024-08-20 NOTE — Patient Instructions (Signed)
 Melissa [name],  Thank you for taking the time for your Medicare Wellness Visit. I appreciate your continued commitment to your health goals. Please review the care plan we discussed, and feel free to reach out if I can assist you further.  Please note that Annual Wellness Visits do not include a physical exam. Some assessments may be limited, especially if the visit was conducted virtually. If needed, we may recommend an in-person follow-up with your provider.  Ongoing Care Seeing your primary care provider every 3 to 6 months helps us  monitor your health and provide consistent, personalized care.   Referrals If a referral was made during today's visit and you haven't received any updates within two weeks, please contact the referred provider directly to check on the status.  Recommended Screenings:  Health Maintenance  Topic Date Due   Zoster (Shingles) Vaccine (2 of 2) 01/31/2021   DTaP/Tdap/Td vaccine (2 - Td or Tdap) 04/15/2021   Medicare Annual Wellness Visit  01/25/2024   Flu Shot  04/04/2024   Hemoglobin A1C  08/19/2024   Eye exam for diabetics  10/08/2024   COVID-19 Vaccine (7 - Pfizer risk 2025-26 season) 11/19/2024   Yearly kidney health urinalysis for diabetes  11/28/2024   Yearly kidney function blood test for diabetes  02/04/2025   Complete foot exam   02/17/2025   Breast Cancer Screening  04/10/2025   Colon Cancer Screening  05/17/2027   Pneumococcal Vaccine for age over 78  Completed   Osteoporosis screening with Bone Density Scan  Completed   Hepatitis C Screening  Completed   Meningitis B Vaccine  Aged Out       02/18/2024   10:36 AM  Advanced Directives  Does Patient Have a Medical Advance Directive? No  Would patient like information on creating a medical advance directive? No - Patient declined    Vision: Annual vision screenings are recommended for early detection of glaucoma, cataracts, and diabetic retinopathy. These exams can also reveal signs of chronic  conditions such as diabetes and high blood pressure.  Dental: Annual dental screenings help detect early signs of oral cancer, gum disease, and other conditions linked to overall health, including heart disease and diabetes.  Please see the attached documents for additional preventive care recommendations.

## 2024-08-20 NOTE — Progress Notes (Signed)
 Virtual Visit Consent   Melissa [name], you are scheduled for a virtual visit with a [facility] provider today. Just as with appointments in the office, your consent must be obtained to participate. Your consent will be active for this visit and any virtual visit you may have with one of our providers in the next 365 days. If you have a MyChart account, a copy of this consent can be sent to you electronically.  As this is a virtual visit, video technology does not allow for your provider to perform a traditional examination. This may limit your provider's ability to fully assess your condition. If your provider identifies any concerns that need to be evaluated in person or the need to arrange testing (such as labs, EKG, etc.), we will make arrangements to do so. Although advances in technology are sophisticated, we cannot ensure that it will always work on either your end or our end. If the connection with a video visit is poor, the visit may have to be switched to a telephone visit. With either a video or telephone visit, we are not always able to ensure that we have a secure connection.  By engaging in this virtual visit, you consent to the provision of healthcare and authorize for your insurance to be billed (if applicable) for the services provided during this visit. Depending on your insurance coverage, you may receive a charge related to this service.  I need to obtain your verbal consent now. Are you willing to proceed with your visit today? Melissa [name] has provided verbal consent on 08/20/2024 for a virtual visit (video or telephone). Melissa CHRISTELLA Barefoot, NP  Date: 08/20/2024 11:01 AM   Virtual Visit via Video Note   I, Melissa [name], connected with  Melissa [name]  ([id number], May 18, 1951) on 08/20/2024 at 11:00 AM EST by a video-enabled telemedicine application and verified that I am speaking with the correct person using two identifiers.  Location: Patient: Virtual Visit  Location Patient: Home Provider: Virtual Visit Location Provider: Home Office   I discussed the limitations of evaluation and management by telemedicine and the availability of in person appointments. The patient expressed understanding and agreed to proceed.    History of Present Illness: Melissa [name] is a 73 y.o. who identifies as a female who was assigned female at birth, and is being seen today for sore throat  Onset was 8-10 days Of note: Was seen on 08/15/2024 in urgent care diagnosed with sore throat and ear fullness acute pharyngitis.  Has had 3 days of sore throat and ear fullness at that time.  This is just continued on without any improvement with over-the-counter medications that were recommended at that time. Continues sore throat, loss of voice now, cough is heavy and worse at night Associated symptoms are congestion and mucus production and nasal- color of yellow tan.  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   Chronic insomnia  10/12/2022   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   Decreased functional mobility 02/01/2022   History of colonic polyps 01/26/2022   History of total knee replacement, bilateral 07/11/2021   Sleep apnea, intolerant of CPAP, declines 05/24/2021   Healthcare maintenance 01/07/2019   Chronic left shoulder pain 01/08/2018   History of bilateral hip replacements  07/19/2017   Acquired renal cyst of left kidney 01/01/2016   Type 2 diabetes mellitus without complications (HCC) 12/15/2014   Spondylosis without myelopathy or radiculopathy, 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/2014   Class 2 severe obesity due to excess calories with serious comorbidity and body mass index (BMI) of 35.0 to 35.9 in adult 11/13/2012   Glaucoma    Anxiety and depression    HTN (hypertension) 01/06/2012   GERD (gastroesophageal reflux disease) 01/06/2012   Hypercholesteremia 01/06/2012    Allergies: Allergies[1] Medications: Current Medications[2]  Observations/Objective: Patient is well-developed, well-nourished in no acute distress.  Resting comfortably  at home.  Head is normocephalic, atraumatic.  No labored breathing.  Speech is clear and coherent with logical content.  Patient is alert and oriented at baseline.  Hoarseness Cough  Congestion tone  Assessment and Plan:   1. Acute bacterial sinusitis (Primary)  - amoxicillin -clavulanate (AUGMENTIN ) 875-125 MG tablet; Take 1 tablet by mouth 2 (two) times daily for 7 days.  Dispense: 14 tablet; Refill: 0 - benzonatate  (TESSALON ) 100 MG capsule; Take 1-2 capsules (100-200 mg total) by mouth 3 (three) times daily as needed for cough.  Dispense: 30 capsule; Refill: 0  - Discussed OTC management -take medications as directed  - May use tylenol  as needed for pain, fevers, body aches - Salt water  gargles - Hydration and voice rest - Seek in person evaluation if worsening or fails to improve   Reviewed side effects, risks and benefits of medication.    Patient acknowledged agreement and understanding of the plan.   Past Medical, Surgical, Social History, Allergies, and Medications have been Reviewed.    Follow Up Instructions: I discussed the assessment and treatment plan with the patient. The patient was provided an opportunity to ask questions and all were answered. The patient agreed with the plan and demonstrated an  understanding of the instructions.  A copy of instructions were sent to the patient via MyChart unless otherwise noted below.    The patient was advised to call back or seek an in-person evaluation if the symptoms worsen or if the condition fails to improve as anticipated.    Melissa CHRISTELLA Barefoot, NP     [1]  Allergies Allergen Reactions   Codeine Other (See Comments)    palpitations   Metformin  And Related Nausea Only   Oxycodone Nausea And Vomiting   Latex Dermatitis, Itching and Rash  [2]  Current Outpatient Medications:    Accu-Chek FastClix Lancets MISC, USE TO TEST BLOOD SUGAR UP TO FOUR TIMES DAILY, Disp: 306 each, Rfl: 1   ACCU-CHEK GUIDE test strip, USE AS DIRECTED FOUR TIMES DAILY, Disp: 100 strip, Rfl: 11   ALPRAZolam  (XANAX ) 0.25 MG tablet, TAKE 1 TABLET(0.25 MG) BY MOUTH AT BEDTIME AS NEEDED FOR SLEEP, Disp: 30 tablet, Rfl: 0   aspirin  EC 81 MG tablet, Take 81 mg by mouth daily. Swallow whole., Disp: , Rfl:    blood glucose meter kit and supplies KIT, Dispense based on patient and insurance preference. Use up to four times daily as directed. DX Code: E11.9, Disp: 1 each, Rfl: 0   desonide  (DESOWEN ) 0.05 % ointment, Apply 1 Application topically 2 (two)  times daily as needed (rash)., Disp: 60 g, Rfl: 5   Dulaglutide  (TRULICITY ) 3 MG/0.5ML SOAJ, Inject 3 mg as directed once a week., Disp: 6 mL, Rfl: 2   escitalopram  (LEXAPRO ) 10 MG tablet, Take 1 tablet (10 mg total) by mouth daily. TAKE 1 TABLET(10 MG) BY MOUTH DAILY, Disp: 90 tablet, Rfl: 3   esomeprazole (NEXIUM) 20 MG capsule, Take 20 mg by mouth once a week., Disp: , Rfl:    hydroquinone  4 % cream, Apply topically 2 (two) times daily., Disp: 28.35 g, Rfl: 0   olmesartan  (BENICAR ) 40 MG tablet, Take 1 tablet (40 mg total) by mouth daily., Disp: 30 tablet, Rfl: 11   omeprazole  (PRILOSEC) 20 MG capsule, Take 1 capsule (20 mg total) by mouth daily., Disp: 30 capsule, Rfl: 0   VYZULTA 0.024 % SOLN, Place 1 drop into both eyes at  bedtime., Disp: , Rfl:

## 2024-08-20 NOTE — Progress Notes (Signed)
 Chief Complaint  Patient presents with   Medicare Wellness    SUBSEQUENT     Subjective:   Melissa [name] is a 73 y.o. female who presents for a Medicare Annual Wellness Visit.  Visit info / Clinical Intake: Medicare Wellness Visit Type:: Subsequent Annual Wellness Visit Persons participating in visit and providing information:: patient Medicare Wellness Visit Mode:: Telephone If telephone:: video declined Since this visit was completed virtually, some vitals may be partially provided or unavailable. Missing vitals are due to the limitations of the virtual format.: Documented vitals are patient reported If Telephone or Video please confirm:: I connected with patient using audio/video enable telemedicine. I verified patient identity with two identifiers, discussed telehealth limitations, and patient agreed to proceed. Patient Location:: HOME Provider Location:: OFFICE Interpreter Needed?: No Pre-visit prep was completed: yes AWV questionnaire completed by patient prior to visit?: no Living arrangements:: (!) lives alone Patient's Overall Health Status Rating: good Typical amount of pain: none Does pain affect daily life?: no Are you currently prescribed opioids?: no  Dietary Habits and Nutritional Risks How many meals a day?: 3 Eats fruit and vegetables daily?: yes Most meals are obtained by: preparing own meals In the last 2 weeks, have you had any of the following?: none Diabetic:: (!) yes Any non-healing wounds?: no How often do you check your BS?: as needed Would you like to be referred to a Nutritionist or for Diabetic Management? : no  Functional Status Activities of Daily Living (to include ambulation/medication): Independent Ambulation: Independent with device- listed below Home Assistive Devices/Equipment: Eyeglasses (Patient not using CPAP) Medication Administration: Independent Home Management (perform basic housework or laundry): Independent Manage your  own finances?: yes Primary transportation is: driving Concerns about vision?: no *vision screening is required for [facility]* (Dr. Olam Repress) Concerns about hearing?: no  Fall Screening Falls in the past year?: 1 Number of falls in past year: 1 (3 TIMES) Was there an injury with Fall?: 1 Fall Risk Category Calculator: 3 Patient Fall Risk Level: High Fall Risk  Fall Risk Patient at Risk for Falls Due to: Impaired balance/gait; History of fall(s); Orthopedic patient Fall risk Follow up: Falls evaluation completed; Education provided  Home and Transportation Safety: All rugs have non-skid backing?: N/A, no rugs All stairs or steps have railings?: N/A, no stairs Grab bars in the bathtub or shower?: (!) no Have non-skid surface in bathtub or shower?: (!) no Good home lighting?: yes Regular seat belt use?: yes Hospital stays in the last year:: no  Cognitive Assessment Difficulty concentrating, remembering, or making decisions? : yes (concentrating) Will 6CIT or Mini Cog be Completed: yes What year is it?: 0 points What month is it?: 0 points Give patient an address phrase to remember (5 components): [facility] [address] About what time is it?: 0 points Count backwards from 20 to 1: 0 points Say the months of the year in reverse: 0 points Repeat the address phrase from earlier: 0 points 6 CIT Score: 0 points  Advance Directives (For Healthcare) Does Patient Have a Medical Advance Directive?: No Would patient like information on creating a medical advance directive?: No - Patient declined  Reviewed/Updated  Reviewed/Updated: Reviewed All (Medical, Surgical, Family, Medications, Allergies, Care Teams, Patient Goals)    Allergies (verified) Codeine, Metformin  and related, Oxycodone, and Latex   Current Medications (verified) Outpatient Encounter Medications as of 08/20/2024  Medication Sig   Accu-Chek FastClix Lancets MISC USE TO TEST BLOOD SUGAR UP TO FOUR TIMES DAILY  ACCU-CHEK GUIDE test strip USE AS DIRECTED FOUR TIMES DAILY   ALPRAZolam  (XANAX ) 0.25 MG tablet TAKE 1 TABLET(0.25 MG) BY MOUTH AT BEDTIME AS NEEDED FOR SLEEP   amoxicillin -clavulanate (AUGMENTIN ) 875-125 MG tablet Take 1 tablet by mouth 2 (two) times daily for 7 days.   aspirin  EC 81 MG tablet Take 81 mg by mouth daily. Swallow whole.   benzonatate  (TESSALON ) 100 MG capsule Take 1-2 capsules (100-200 mg total) by mouth 3 (three) times daily as needed for cough.   blood glucose meter kit and supplies KIT Dispense based on patient and insurance preference. Use up to four times daily as directed. DX Code: E11.9   desonide  (DESOWEN ) 0.05 % ointment Apply 1 Application topically 2 (two) times daily as needed (rash).   Dulaglutide  (TRULICITY ) 3 MG/0.5ML SOAJ Inject 3 mg as directed once a week.   escitalopram  (LEXAPRO ) 10 MG tablet Take 1 tablet (10 mg total) by mouth daily. TAKE 1 TABLET(10 MG) BY MOUTH DAILY   esomeprazole (NEXIUM) 20 MG capsule Take 20 mg by mouth once a week.   hydroquinone  4 % cream Apply topically 2 (two) times daily.   olmesartan  (BENICAR ) 40 MG tablet Take 1 tablet (40 mg total) by mouth daily.   omeprazole  (PRILOSEC) 20 MG capsule Take 1 capsule (20 mg total) by mouth daily.   VYZULTA 0.024 % SOLN Place 1 drop into both eyes at bedtime.   No facility-administered encounter medications on file as of 08/20/2024.    History: Past Medical History:  Diagnosis Date   Allergy    Anxiety    Arthritis    s/p R TKR   Class 2 severe obesity due to excess calories with serious comorbidity and body mass index (BMI) of 35.0 to 35.9 in adult 11/13/2012   COVID-19 05/04/2023   Cyst of left kidney 2015   by lumbar MRI pending renal US    DDD (degenerative disc disease), lumbar 03/2014   severe L2-3 with L HNP with L2/3 nerve root impingement Ge[facility] @ [facility])   Depression with anxiety    Diabetes type 2, controlled (HCC) 2011   borderline   Glaucoma    History of ulcer disease    HLD  (hyperlipidemia)    no meds taken   Hypertension    OAB (overactive bladder) 10/14/2019   Osteoarthritis of spine with radiculopathy, 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   Plantar fasciitis of left foot 10/01/2013   PONV (postoperative nausea and vomiting)    Primary osteoarthritis of left knee 07/11/2021   Right-sided face pain 01/07/2019   Rotator cuff tear 02/02/2017   Seasonal allergies    Sleep apnea 2022   no cpap   Trigger middle finger of right hand 11/12/2018   Vitreous degeneration, unspecified eye 10/25/2012   Past Surgical History:  Procedure Laterality Date   ARTERY BIOPSY Right 01/23/2019   Procedure: REMOVAL OF PART OF RIGHT TEMPORAL ARTERY FOR BIOPSY;  Surgeon: Sheldon Standing, MD;  Location: [facility] OR;  Service: General;  Laterality: Right;   CATARACT EXTRACTION W/ INTRAOCULAR LENS IMPLANT Bilateral    COLONOSCOPY  04/2017   TA, diverticulosis  ([facility])   GLAUCOMA SURGERY     HAMMER TOE SURGERY Bilateral    LAPAROSCOPIC SUPRACERVICAL HYSTERECTOMY  2007   with BSO, cervix remains.  Fibroids.   Right hip replacement     SHOULDER ARTHROSCOPY Left 12/12/2022   Procedure: LEFT SHOULDER ARTHROSCOPY WITH BALLOON SPACER;  Surgeon: Genelle Standing, MD;  Location: [facility];  Service: Orthopedics;  Laterality: Left;   TONSILLECTOMY     TOTAL HIP ARTHROPLASTY Left 2021   TOTAL KNEE ARTHROPLASTY Right 2004   TKR (Dr. Deward with [facility])   TOTAL KNEE ARTHROPLASTY Left 07/11/2021   Procedure: LEFT TOTAL KNEE ARTHROPLASTY;  Surgeon: Jerri Kay HERO, MD;  Location: [facility] OR;  Service: Orthopedics;  Laterality: Left;   TOTAL KNEE REVISION Right 03/2014   Dr Raynold [facility]   TOTAL SHOULDER ARTHROPLASTY Right 2022   Family History  Problem Relation Age of Onset   Cancer Mother 42       bone, MM   Diabetes Maternal Grandmother    Hypertension Maternal Grandmother    Ovarian cancer Maternal Grandmother 87   Stroke Other        unsure who   CAD Neg Hx    Colon cancer Neg Hx     Esophageal cancer Neg Hx    Rectal cancer Neg Hx    Stomach cancer Neg Hx    Breast cancer Neg Hx    BRCA 1/2 Neg Hx    Social History   Occupational History   Occupation: work with disabilities/ Retired    Associate Professor: THERAPIST, SPORTS  Tobacco Use   Smoking status: Never    Passive exposure: Past   Smokeless tobacco: Never  Vaping Use   Vaping status: Never Used  Substance and Sexual Activity   Alcohol use: No   Drug use: No   Sexual activity: Not Currently    Birth control/protection: Surgical   Tobacco Counseling Counseling given: Not Answered  SDOH Screenings   Food Insecurity: No Food Insecurity (08/20/2024)  Housing: Low Risk (08/20/2024)  Transportation Needs: No Transportation Needs (08/20/2024)  Utilities: Not At Risk (08/20/2024)  Alcohol Screen: Low Risk (01/25/2023)  Depression (PHQ2-9): Low Risk (08/20/2024)  Financial Resource Strain: Medium Risk (01/25/2023)  Physical Activity: Sufficiently Active (08/20/2024)  Social Connections: Unknown (08/20/2024)  Stress: Stress Concern Present (08/20/2024)  Tobacco Use: Low Risk (08/20/2024)  Health Literacy: Adequate Health Literacy (08/20/2024)   See flowsheets for full screening details  Depression Screen PHQ 2 & 9 Depression Scale- Over the past 2 weeks, how often have you been bothered by any of the following problems? Little interest or pleasure in doing things: 0 Feeling down, depressed, or hopeless (PHQ Adolescent also includes...irritable): 1 PHQ-2 Total Score: 1 Trouble falling or staying asleep, or sleeping too much: 0 (sleep aid prn) Feeling tired or having little energy: 0 Poor appetite or overeating (PHQ Adolescent also includes...weight loss): 1 Feeling bad about yourself - or that you are a failure or have let yourself or your family down: 0 Trouble concentrating on things, such as reading the newspaper or watching television (PHQ Adolescent also includes...like school work): 0 Moving or speaking  so slowly that other people could have noticed. Or the opposite - being so fidgety or restless that you have been moving around a lot more than usual: 0 Thoughts that you would be better off dead, or of hurting yourself in some way: 0 PHQ-9 Total Score: 2 If you checked off any problems, how difficult have these problems made it for you to do your work, take care of things at home, or get along with other people?: Not difficult at all  Depression Treatment Depression Interventions/Treatment : EYV7-0 Score <4 Follow-up Not Indicated; Medication     Goals Addressed             This Visit's Progress    08/20/2024: My goal is to lose  weight by joining the Silver Sneakers Program at the [facility].               Objective:    Today's Vitals   08/20/24 1304  Weight: 210 lb (95.3 kg)  Height: 5' 5 (1.651 m)  PainSc: 8   PainLoc: Throat   Body mass index is 34.95 kg/m.  Hearing/Vision screen Hearing Screening - Comments:: Patient has adequate hearing. Vision Screening - Comments:: Patient has adequate vision with the use of eyeglasses.  Eye exam done by Dr. Olam Repress Immunizations and Health Maintenance Health Maintenance  Topic Date Due   DTaP/Tdap/Td (2 - Td or Tdap) 04/15/2021   HEMOGLOBIN A1C  08/19/2024   OPHTHALMOLOGY EXAM  10/08/2024   COVID-19 Vaccine (7 - Pfizer risk 2025-26 season) 11/19/2024   Diabetic kidney evaluation - Urine ACR  11/28/2024   Diabetic kidney evaluation - eGFR measurement  02/04/2025   FOOT EXAM  02/17/2025   Mammogram  04/10/2025   Medicare Annual Wellness (AWV)  08/20/2025   Colonoscopy  05/17/2027   Pneumococcal Vaccine: 50+ Years  Completed   Influenza Vaccine  Completed   Bone Density Scan  Completed   Hepatitis C Screening  Completed   Zoster Vaccines- Shingrix  Completed   Meningococcal B Vaccine  Aged Out        Assessment/Plan:  This is a routine wellness examination for Melissa [name].  Patient Care Team: Trudy Mliss Dragon, MD as  PCP - General (Internal Medicine) Lynwood Schilling, MD as PCP - Cardiology (Cardiology) Carlie Clark, MD as Consulting Physician (Otolaryngology) Lewanda Vinie BROCKS., MD as Consulting Physician (Sports Medicine) Repress Olam CROME, MD as Consulting Physician (Ophthalmology)  I have personally reviewed and noted the following in the patients chart:   Medical and social history Use of alcohol, tobacco or illicit drugs  Current medications and supplements including opioid prescriptions. Functional ability and status Nutritional status Physical activity Advanced directives List of other physicians Hospitalizations, surgeries, and ER visits in previous 12 months Vitals Screenings to include cognitive, depression, and falls Referrals and appointments  No orders of the defined types were placed in this encounter.  In addition, I have reviewed and discussed with patient certain preventive protocols, quality metrics, and best practice recommendations. A written personalized care plan for preventive services as well as general preventive health recommendations were provided to patient.   Roz LOISE Fuller, LPN   [id number]   Return in about 1 year (around 08/20/2025) for Medicare wellness.  After Visit Summary: (MyChart) Due to this being a telephonic visit, the after visit summary with patients personalized plan was offered to patient via MyChart   Nurse Notes:   HM Addressed: Vaccines Due: Dtap; Immunization record was verified by [facility] and updated in patient's chart. Patient is due for HgA1C.

## 2024-08-29 ENCOUNTER — Telehealth: Admitting: Family Medicine

## 2024-08-29 DIAGNOSIS — J069 Acute upper respiratory infection, unspecified: Secondary | ICD-10-CM

## 2024-08-29 NOTE — Progress Notes (Signed)
" °  Because Melissa [name], I feel your condition warrants further evaluation and I recommend that you be seen in a face-to-face visit.   NOTE: There will be NO CHARGE for this E-Visit   If you are having a true medical emergency, please call 911.    With your symptoms lasting so long and medications not having been effective, we recommend you be seen at urgent care for labs and XR.   For an urgent face to face visit, [facility] has multiple urgent care centers for your convenience.  Click the link below for the full list of locations and hours, walk-in wait times, appointment scheduling options and driving directions:  Urgent Care - [place], [place], [place], [place], [place], [place]  [place]     Your MyChart E-visit questionnaire answers were reviewed by a board certified advanced clinical practitioner to complete your personal care plan based on your specific symptoms.    Thank you for using e-Visits.    "

## 2024-08-30 ENCOUNTER — Encounter (HOSPITAL_COMMUNITY): Payer: Self-pay | Admitting: Emergency Medicine

## 2024-08-30 ENCOUNTER — Ambulatory Visit (HOSPITAL_COMMUNITY): Admission: EM | Admit: 2024-08-30 | Discharge: 2024-08-30 | Disposition: A | Discharge status: Home / Self Care

## 2024-08-30 ENCOUNTER — Other Ambulatory Visit: Payer: Self-pay

## 2024-08-30 ENCOUNTER — Ambulatory Visit (HOSPITAL_COMMUNITY)

## 2024-08-30 DIAGNOSIS — R052 Subacute cough: Secondary | ICD-10-CM | POA: Diagnosis not present

## 2024-08-30 DIAGNOSIS — J029 Acute pharyngitis, unspecified: Secondary | ICD-10-CM | POA: Diagnosis not present

## 2024-08-30 LAB — POCT RAPID STREP A (OFFICE): Rapid Strep A Screen: NEGATIVE

## 2024-08-30 MED ORDER — PREDNISONE 20 MG PO TABS: 40.0000 mg | ORAL_TABLET | Freq: Every day | ORAL | 0 refills | Status: AC

## 2024-08-30 MED ORDER — ALBUTEROL SULFATE HFA 108 (90 BASE) MCG/ACT IN AERS: 1.0000 | INHALATION_SPRAY | Freq: Four times a day (QID) | RESPIRATORY_TRACT | 0 refills | Status: AC | PRN

## 2024-08-30 NOTE — ED Provider Notes (Signed)
 " [facility]    CSN: [id number] Arrival date & time: 08/30/24  1045      History   Chief Complaint No chief complaint on file.   HPI Melissa [name] is a 73 y.o. female.   This 73 year old female is being seen for complaints of fatigue, sore throat, and cough ongoing for 3 weeks.  She reports cough is productive with clear phlegm with an odor.  She has been seen by her primary care provider, urgent care, and tele-visits.  She has completed a course of antibiotics.  She has been using cough drops, Tylenol , Tessalon  with minimal relief of symptoms.  She reports intermittent nasal congestion.  She reports she can hear/feel in her ears when she swallows.  She reports some postnasal drip draining to the back of her throat.  She reports shortness of breath with coughing fits.  She denies fever, chills headache, dizziness.  She denies chest pain.  She denies abdominal pain, nausea, vomiting, diarrhea.     Past Medical History:  Diagnosis Date   Allergy    Anxiety    Arthritis    s/p R TKR   Class 2 severe obesity due to excess calories with serious comorbidity and body mass index (BMI) of 35.0 to 35.9 in adult 11/13/2012   COVID-19 05/04/2023   Cyst of left kidney 2015   by lumbar MRI pending renal US    DDD (degenerative disc disease), lumbar 03/2014   severe L2-3 with L HNP with L2/3 nerve root impingement [facility] @ [facility])   Depression with anxiety    Diabetes type 2, controlled (HCC) 2011   borderline   Glaucoma    History of ulcer disease    HLD (hyperlipidemia)    no meds taken   Hypertension    OAB (overactive bladder) 10/14/2019   Osteoarthritis of spine with radiculopathy, 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   Plantar fasciitis of left foot 10/01/2013   PONV (postoperative nausea and vomiting)    Primary osteoarthritis of left knee 07/11/2021   Right-sided face pain 01/07/2019   Rotator cuff tear 02/02/2017   Seasonal allergies    Sleep apnea 2022   no cpap   Trigger  middle finger of right hand 11/12/2018   Vitreous degeneration, unspecified eye 10/25/2012    Patient Active Problem List   Diagnosis Date Noted   Long term current use of therapeutic drug 08/15/2024   Intermittent palpitations 12/03/2023   Lichenification 12/03/2023   Vitamin D  deficiency 08/13/2023   Rosacea, unspecified 02/06/2023   Nontraumatic incomplete tear of left rotator cuff 12/12/2022   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   Chronic insomnia 10/12/2022   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   Decreased functional mobility 02/01/2022   History of colonic polyps 01/26/2022   History of total knee replacement, bilateral 07/11/2021   Sleep apnea, intolerant of CPAP, declines 05/24/2021   Healthcare maintenance 01/07/2019   Chronic left shoulder pain 01/08/2018   History of bilateral hip replacements 07/19/2017   Acquired renal cyst of left kidney 01/01/2016   Type 2 diabetes mellitus without complications (HCC) 12/15/2014   Spondylosis without myelopathy or radiculopathy, 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/2014   Class 2 severe obesity due to excess calories with serious comorbidity and body mass index (BMI) of 35.0 to 35.9 in adult 11/13/2012   Glaucoma    Anxiety and depression    HTN (hypertension) 01/06/2012   GERD (gastroesophageal reflux disease) 01/06/2012   Hypercholesteremia 01/06/2012    Past Surgical History:  Procedure Laterality Date   ARTERY BIOPSY Right 01/23/2019   Procedure: REMOVAL OF PART OF RIGHT TEMPORAL ARTERY FOR BIOPSY;  Surgeon: Sheldon Standing, MD;  Location: [facility];  Service: General;  Laterality: Right;   CATARACT EXTRACTION W/ INTRAOCULAR LENS IMPLANT Bilateral    COLONOSCOPY  04/2017   TA, diverticulosis  ([facility])   GLAUCOMA SURGERY     HAMMER TOE SURGERY Bilateral    LAPAROSCOPIC SUPRACERVICAL HYSTERECTOMY  2007   with BSO, cervix remains.  Fibroids.   Right hip replacement     SHOULDER ARTHROSCOPY Left 12/12/2022   Procedure: LEFT  SHOULDER ARTHROSCOPY WITH BALLOON SPACER;  Surgeon: Genelle Standing, MD;  Location: [facility];  Service: Orthopedics;  Laterality: Left;   TONSILLECTOMY     TOTAL HIP ARTHROPLASTY Left 2021   TOTAL KNEE ARTHROPLASTY Right 2004   TKR (Dr. Deward with [facility])   TOTAL KNEE ARTHROPLASTY Left 07/11/2021   Procedure: LEFT TOTAL KNEE ARTHROPLASTY;  Surgeon: Jerri Kay HERO, MD;  Location: [facility];  Service: Orthopedics;  Laterality: Left;   TOTAL KNEE REVISION Right 03/2014   Dr Raynold [facility]   TOTAL SHOULDER ARTHROPLASTY Right 2022    OB History     Gravida  0   Para      Term      Preterm      AB      Living  0      SAB      IAB      Ectopic      Multiple      Live Births               Home Medications    Prior to Admission medications  Medication Sig Start Date End Date Taking? Authorizing Provider  albuterol  (VENTOLIN  HFA) 108 (90 Base) MCG/ACT inhaler Inhale 1-2 puffs into the lungs every 6 (six) hours as needed for wheezing or shortness of breath. 08/30/24  Yes Jovanna Hodges C, FNP  predniSONE  (DELTASONE ) 20 MG tablet Take 2 tablets (40 mg total) by mouth daily with breakfast. 08/30/24  Yes Dacie Mandel C, FNP  Accu-Chek FastClix Lancets MISC USE TO TEST BLOOD SUGAR UP TO FOUR TIMES DAILY 11/25/18   Dragnev, Romualdo Capers, NP  ACCU-CHEK GUIDE test strip USE AS DIRECTED FOUR TIMES DAILY 06/28/20   Norleen Lynwood ORN, MD  ALPRAZolam  (XANAX ) 0.25 MG tablet TAKE 1 TABLET(0.25 MG) BY MOUTH AT BEDTIME AS NEEDED FOR SLEEP 07/14/24   Trudy Mliss Dragon, MD  aspirin  EC 81 MG tablet Take 81 mg by mouth daily. Swallow whole.    [provider]  benzonatate  (TESSALON ) 100 MG capsule Take 1-2 capsules (100-200 mg total) by mouth 3 (three) times daily as needed for cough. 08/20/24   Moishe Chiquita HERO, NP  blood glucose meter kit and supplies KIT Dispense based on patient and insurance preference. Use up to four times daily as directed. DX Code: E11.9  02/11/18   Kathlyne Romualdo Capers, NP  desonide  (DESOWEN ) 0.05 % ointment Apply 1 Application topically 2 (two) times daily as needed (rash). 08/15/23   Amoako, Prince, MD  Dulaglutide  (TRULICITY ) 3 MG/0.5ML SOAJ Inject 3 mg as directed once a week. 02/18/24   Tobie Gaines, DO  escitalopram  (LEXAPRO ) 10 MG tablet Take 1 tablet (10 mg total) by mouth daily. TAKE 1 TABLET(10 MG) BY MOUTH DAILY 11/08/23 11/02/24  Trudy Mliss Dragon, MD  esomeprazole (NEXIUM) 20 MG capsule Take 20 mg by mouth once a week.  [provider]  hydroquinone  4 % cream Apply topically 2 (two) times daily. 11/30/23   Trudy Mliss Dragon, MD  olmesartan  (BENICAR ) 40 MG tablet Take 1 tablet (40 mg total) by mouth daily. 11/29/23 11/28/24  Trudy Mliss Dragon, MD  omeprazole  (PRILOSEC) 20 MG capsule Take 1 capsule (20 mg total) by mouth daily. 02/06/24   Palumbo, April, MD  VYZULTA 0.024 % SOLN Place 1 drop into both eyes at bedtime. 05/16/21   [provider]    Family History Family History  Problem Relation Age of Onset   Cancer Mother 23       bone, MM   Diabetes Maternal Grandmother    Hypertension Maternal Grandmother    Ovarian cancer Maternal Grandmother 25   Stroke Other        unsure who   CAD Neg Hx    Colon cancer Neg Hx    Esophageal cancer Neg Hx    Rectal cancer Neg Hx    Stomach cancer Neg Hx    Breast cancer Neg Hx    BRCA 1/2 Neg Hx     Social History Social History[1]   Allergies   Codeine, Metformin  and related, Oxycodone, and Latex   Review of Systems Review of Systems  Constitutional:  Positive for activity change and fatigue. Negative for appetite change, chills and fever.  HENT:  Positive for congestion, postnasal drip, sore throat and voice change. Negative for ear discharge and ear pain.   Respiratory:  Positive for cough and shortness of breath.   Cardiovascular:  Negative for chest pain.  Gastrointestinal:  Negative for abdominal pain, diarrhea, nausea and  vomiting.  Musculoskeletal:  Negative for neck stiffness.  Skin:  Negative for color change and rash.  Neurological:  Negative for dizziness and headaches.  All other systems reviewed and are negative.    Physical Exam Triage Vital Signs ED Triage Vitals  Encounter Vitals Group     BP 08/30/24 1356 (!) 158/93     Girls Systolic BP Percentile --      Girls Diastolic BP Percentile --      Boys Systolic BP Percentile --      Boys Diastolic BP Percentile --      Pulse Rate 08/30/24 1356 75     Resp 08/30/24 1356 18     Temp 08/30/24 1356 98 F (36.7 C)     Temp Source 08/30/24 1356 Oral     SpO2 08/30/24 1356 95 %     Weight --      Height --      Head Circumference --      Peak Flow --      Pain Score 08/30/24 1353 8     Pain Loc --      Pain Education --      Exclude from Growth Chart --    No data found.  Updated Vital Signs BP (!) 158/93 (BP Location: Right Arm)   Pulse 75   Temp 98 F (36.7 C) (Oral)   Resp 18   SpO2 95%   Visual Acuity Right Eye Distance:   Left Eye Distance:   Bilateral Distance:    Right Eye Near:   Left Eye Near:    Bilateral Near:     Physical Exam Vitals and nursing note reviewed.  Constitutional:      General: She is not in acute distress.    Appearance: She is well-developed. She is not ill-appearing or toxic-appearing.  Comments: Pleasant female appearing stated age found sitting in chair in no acute distress.  HENT:     Head: Normocephalic and atraumatic.     Right Ear: Tympanic membrane and external ear normal.     Left Ear: Tympanic membrane and external ear normal.     Nose: Congestion and rhinorrhea present.     Right Turbinates: Enlarged.     Mouth/Throat:     Lips: Pink.     Mouth: Mucous membranes are moist.     Pharynx: Pharyngeal swelling present. No oropharyngeal exudate or posterior oropharyngeal erythema.  Eyes:     Conjunctiva/sclera: Conjunctivae normal.  Cardiovascular:     Rate and Rhythm: Normal rate  and regular rhythm.     Heart sounds: Normal heart sounds. No murmur heard. Pulmonary:     Effort: Pulmonary effort is normal. No tachypnea, bradypnea or respiratory distress.     Breath sounds: Examination of the left-lower field reveals rhonchi. Rhonchi present.  Musculoskeletal:     Cervical back: Neck supple.  Lymphadenopathy:     Cervical: Cervical adenopathy present.  Skin:    General: Skin is warm and dry.     Capillary Refill: Capillary refill takes less than 2 seconds.  Neurological:     Mental Status: She is alert.  Psychiatric:        Mood and Affect: Mood normal.      UC Treatments / Results  Labs (all labs ordered are listed, but only abnormal results are displayed) Labs Reviewed  POCT RAPID STREP A (OFFICE) - Normal    EKG   Radiology DG Chest 2 View Result Date: 08/30/2024 CLINICAL DATA:  Cough.  Malaise for 3 weeks. EXAM: CHEST - 2 VIEW COMPARISON:  02/05/2024 FINDINGS: Cardiomediastinal silhouette and pulmonary vasculature are within normal limits. Lungs are clear. Reversed RIGHT total shoulder prosthesis is partially visualized. IMPRESSION: No acute cardiopulmonary process. Electronically Signed   By: Aliene Lloyd M.D.   On: 08/30/2024 15:50    Procedures Procedures (including critical care time)  Medications Ordered in UC Medications - No data to display  Initial Impression / Assessment and Plan / UC Course  I have reviewed the triage vital signs and the nursing notes.  Pertinent labs & imaging results that were available during my care of the patient were reviewed by me and considered in my medical decision making (see chart for details).     Vitals and triage reviewed, patient is hemodynamically stable.  Strep swab obtained and is negative.  Chest x-ray negative for acute findings.  Presentation consistent with bronchitis.  Her last A1c was 5.8.  She reports her glucose levels are typically right around 100.  She will be given a short course of  steroids and an albuterol  inhaler.  Advised honey water  and salt water  gargles for cough and sore throat.  Advised cetirizine , guaifenesin for symptom management.  Plan of care, follow-up care, return precautions given, no questions at this time. Final Clinical Impressions(s) / UC Diagnoses   Final diagnoses:  Subacute cough  Sore throat     Discharge Instructions      Your evaluation shows you have a viral infection of the upper airways of your lungs. Use the following medicines to help with your symptoms:  Take prednisone  steroid pills as prescribed.  2 pills- 40mg - once daily each morning with food/breakfast for 5 days Do not take ibuprofen/naproxen /other NSAIDs while taking steroid pills as this can cause upset stomach.   Albuterol  inhaler 2 puffs every  4-6 hours as needed for cough, shortness of breath, and wheezing.  Guaifenesin (mucinex) as needed for cough/nasal congestion.   Cetirizine  (Zyrtec ), available over the counter, can help with nasal drainage.  Take 1 tablet daily.  2 teaspoons of honey mixed in 1 cup warm water , drink this every 4-6 hours for sore throat and cough.  Salt water  gargles, 1/2-1 teaspoon salt dissolved in 1 cup water , gargle and spit out every 6 hours for sore throat.  If you develop any new or worsening symptoms or if your symptoms do not start to improve, please return here or follow-up with your primary care provider. If your symptoms are severe, please go to the emergency room.      ED Prescriptions     Medication Sig Dispense Auth. Provider   predniSONE  (DELTASONE ) 20 MG tablet Take 2 tablets (40 mg total) by mouth daily with breakfast. 5 tablet Wali Reinheimer C, FNP   albuterol  (VENTOLIN  HFA) 108 (90 Base) MCG/ACT inhaler Inhale 1-2 puffs into the lungs every 6 (six) hours as needed for wheezing or shortness of breath. 1 each Demarques Pilz C, FNP      PDMP not reviewed this encounter.     [1]  Social History Tobacco Use   Smoking  status: Never    Passive exposure: Past   Smokeless tobacco: Never  Vaping Use   Vaping status: Never Used  Substance Use Topics   Alcohol use: No   Drug use: No     Lennice Jon BROCKS, FNP 08/30/24 1602  "

## 2024-08-30 NOTE — Discharge Instructions (Addendum)
 Your evaluation shows you have a viral infection of the upper airways of your lungs. Use the following medicines to help with your symptoms:  Take prednisone  steroid pills as prescribed.  2 pills- 40mg - once daily each morning with food/breakfast for 5 days Do not take ibuprofen/naproxen /other NSAIDs while taking steroid pills as this can cause upset stomach.   Albuterol  inhaler 2 puffs every 4-6 hours as needed for cough, shortness of breath, and wheezing.  Guaifenesin (mucinex) as needed for cough/nasal congestion.   Cetirizine  (Zyrtec ), available over the counter, can help with nasal drainage.  Take 1 tablet daily.  2 teaspoons of honey mixed in 1 cup warm water , drink this every 4-6 hours for sore throat and cough.  Salt water  gargles, 1/2-1 teaspoon salt dissolved in 1 cup water , gargle and spit out every 6 hours for sore throat.  If you develop any new or worsening symptoms or if your symptoms do not start to improve, please return here or follow-up with your primary care provider. If your symptoms are severe, please go to the emergency room.

## 2024-08-30 NOTE — ED Triage Notes (Signed)
 Patient has been feeling bad for 3 weeks.  Has had in person visits and virtual.  Patient has been treated with an antibiotic.    Patient has had fatigue, cough, rattling in chest.  Reports secretions are clear, but complains about an odor to secretions.  Reports drainage in throat.  Patient can feel/hear sound in ears when she swallows  Patient has had antibiotic, tylenol , jelly cough pill.    Patient works at group homes

## 2024-09-02 ENCOUNTER — Ambulatory Visit: Admitting: Student

## 2024-09-02 VITALS — BP 143/86 | HR 66 | Temp 97.7°F | Wt 216.0 lb

## 2024-09-02 DIAGNOSIS — Z7985 Long-term (current) use of injectable non-insulin antidiabetic drugs: Secondary | ICD-10-CM | POA: Diagnosis not present

## 2024-09-02 DIAGNOSIS — F419 Anxiety disorder, unspecified: Secondary | ICD-10-CM | POA: Diagnosis not present

## 2024-09-02 DIAGNOSIS — E119 Type 2 diabetes mellitus without complications: Secondary | ICD-10-CM | POA: Diagnosis not present

## 2024-09-02 DIAGNOSIS — I10 Essential (primary) hypertension: Secondary | ICD-10-CM

## 2024-09-02 DIAGNOSIS — F32A Depression, unspecified: Secondary | ICD-10-CM | POA: Diagnosis not present

## 2024-09-02 DIAGNOSIS — Z794 Long term (current) use of insulin: Secondary | ICD-10-CM | POA: Diagnosis not present

## 2024-09-02 DIAGNOSIS — J069 Acute upper respiratory infection, unspecified: Secondary | ICD-10-CM

## 2024-09-02 LAB — POCT GLYCOSYLATED HEMOGLOBIN (HGB A1C): HbA1c, POC (controlled diabetic range): 5.8 % (ref 0.0–7.0)

## 2024-09-02 LAB — GLUCOSE, CAPILLARY: Glucose-Capillary: 72 mg/dL (ref 70–99)

## 2024-09-02 MED ORDER — DULAGLUTIDE 4.5 MG/0.5ML ~~LOC~~ SOAJ: 4.5000 mg | SUBCUTANEOUS | 5 refills | Status: AC

## 2024-09-02 MED ORDER — ESCITALOPRAM OXALATE 20 MG PO TABS: 20.0000 mg | ORAL_TABLET | Freq: Every day | ORAL | 2 refills | Status: AC

## 2024-09-02 MED ORDER — AMLODIPINE BESYLATE 5 MG PO TABS: 5.0000 mg | ORAL_TABLET | Freq: Every day | ORAL | 11 refills | Status: AC

## 2024-09-02 NOTE — Assessment & Plan Note (Signed)
 Blood pressure here elevated at 143/86 on recheck after initial was 158/95.  Her blood pressure was slightly higher at recent urgent care visits.  Blood pressure at home has been similar.  She has been taking olmesartan  40 mg daily and sometimes taking extra 20 mg at night which she did last night as well.  She has been on amlodipine  and hydrochlorothiazide  in the past without any noted side effects to warrant discontinuation.  She is agreeable to restart amlodipine  which comes in a combination pill with olmesartan . - Start amlodipine  5 mg daily and continue olmesartan  40 mg daily - Blood pressure log with goal of less than 130/80, if not at goal could add hydrochlorothiazide  versus increasing amlodipine  - BMP today

## 2024-09-02 NOTE — Assessment & Plan Note (Signed)
 PHQ 9 score 7 and GAD-7 score 5 today.  She has been on Lexapro  10 mg daily and Xanax  0.25 mg nightly as needed.  She recently ran out of Xanax  and had been using it nearly daily for a short period of time.  She denies any withdrawal symptoms and after discussion of risks, benefits, and alternatives is agreeable to stopping this medication.  Her baseline anxiety level is slightly higher which is affected by 2 family members currently in the hospital.  Discussed increasing her Lexapro  and she is agreeable to this.  She also previously was in counseling with Renda Pontes and did well with her.  She is happy to reconnect and I will send a message to help set up follow-up. - Increase Lexapro  to 20 mg daily - Discontinue Xanax , however if acute anxiety symptoms within the next 4 to 6 weeks she may call to get a short course of Xanax  0.25 mg nightly as needed - Resume CBT with Renda Pontes

## 2024-09-02 NOTE — Progress Notes (Signed)
 "  CC: Routine Follow Up for management of chronic medical conditions after last office visit 02/18/2024  HPI:  Melissa [name] is a 73 y.o. female with pertinent PMH of hypertension, OSA, type 2 diabetes, GERD, HLD, obesity, glaucoma, and anxiety and depression who presents as above. Please see assessment and plan below for further details.  Medications: Current Outpatient Medications  Medication Instructions   Accu-Chek FastClix Lancets MISC USE TO TEST BLOOD SUGAR UP TO FOUR TIMES DAILY   ACCU-CHEK GUIDE test strip USE AS DIRECTED FOUR TIMES DAILY   albuterol  (VENTOLIN  HFA) 108 (90 Base) MCG/ACT inhaler 1-2 puffs, Inhalation, Every 6 hours PRN   amLODipine  (NORVASC ) 5 mg, Oral, Daily   aspirin  EC 81 mg, Daily   benzonatate  (TESSALON ) 100-200 mg, Oral, 3 times daily PRN   blood glucose meter kit and supplies KIT Dispense based on patient and insurance preference. Use up to four times daily as directed. DX Code: E11.9   desonide  (DESOWEN ) 0.05 % ointment 1 Application, Topical, 2 times daily PRN   Dulaglutide  4.5 mg, Subcutaneous, Weekly   escitalopram  (LEXAPRO ) 20 mg, Oral, Daily   esomeprazole (NEXIUM) 20 mg, Weekly   hydroquinone  4 % cream Topical, 2 times daily   olmesartan  (BENICAR ) 40 mg, Oral, Daily   omeprazole  (PRILOSEC) 20 mg, Oral, Daily   predniSONE  (DELTASONE ) 40 mg, Oral, Daily with breakfast   VYZULTA 0.024 % SOLN 1 drop, Daily at bedtime     Review of Systems:   Pertinent items noted in HPI and/or A&P.  Physical Exam:  Vitals:   09/02/24 1419 09/02/24 1432  BP: (!) 158/95 (!) 143/86  Pulse: 66 66  Temp: 97.7 F (36.5 C)   TempSrc: Oral   SpO2: 97%   Weight: 216 lb (98 kg)     Constitutional: Slightly anxious appearing elderly female. In no acute distress. HEENT: Normocephalic, atraumatic, Sclera non-icteric, PERRL, EOM intact Cardio:Regular rate and rhythm. 2+ bilateral radial pulses. Pulm:Clear to auscultation bilaterally. Normal work of breathing on  room air. FDX:Wzhjupcz for extremity edema. Skin:Warm and dry. Neuro:Alert and oriented x3. No focal deficit noted. Psych:Pleasant mood and affect.   Assessment & Plan:   Assessment & Plan Primary hypertension Blood pressure here elevated at 143/86 on recheck after initial was 158/95.  Her blood pressure was slightly higher at recent urgent care visits.  Blood pressure at home has been similar.  She has been taking olmesartan  40 mg daily and sometimes taking extra 20 mg at night which she did last night as well.  She has been on amlodipine  and hydrochlorothiazide  in the past without any noted side effects to warrant discontinuation.  She is agreeable to restart amlodipine  which comes in a combination pill with olmesartan . - Start amlodipine  5 mg daily and continue olmesartan  40 mg daily - Blood pressure log with goal of less than 130/80, if not at goal could add hydrochlorothiazide  versus increasing amlodipine  - BMP today Controlled type 2 diabetes mellitus without complication, with long-term current use of insulin  (HCC) A1c today stable at 5.8.  She is currently on Trulicity  3 mg weekly and is interested in more weight loss.  Foot exam done 02/2024 and urine microalbumin creatinine ratio done 11/2023. - Increase Trulicity  to 4.5 mg weekly - Return in 3 months for repeat A1c and urine microalbumin creatinine ratio Anxiety and depression PHQ 9 score 7 and GAD-7 score 5 today.  She has been on Lexapro  10 mg daily and Xanax  0.25 mg nightly as needed.  She  recently ran out of Xanax  and had been using it nearly daily for a short period of time.  She denies any withdrawal symptoms and after discussion of risks, benefits, and alternatives is agreeable to stopping this medication.  Her baseline anxiety level is slightly higher which is affected by 2 family members currently in the hospital.  Discussed increasing her Lexapro  and she is agreeable to this.  She also previously was in counseling with Renda Pontes and did well with her.  She is happy to reconnect and I will send a message to help set up follow-up. - Increase Lexapro  to 20 mg daily - Discontinue Xanax , however if acute anxiety symptoms within the next 4 to 6 weeks she may call to get a short course of Xanax  0.25 mg nightly as needed - Resume CBT with Renda Pontes Viral upper respiratory tract infection Presents for routine follow-up but also recently went to urgent care twice.  First on 08/15/2024 was diagnosed with acute pharyngitis which was treated with 7 days of Augmentin  and then on 08/30/2024 where she was diagnosed with acute bronchitis and was treated with 5 days of prednisone  and albuterol  inhaler.  She feels a lot better today but is still having some upper respiratory symptoms, mostly congestion.  She does take loratadine at home.  I discussed maybe switching this to another nondrowsy antihistamine and adding Flonase  and she is agreeable.  On exam her lungs are clear and she is breathing comfortably.  Return precautions discussed.  Orders Placed This Encounter  Procedures   Glucose, capillary   Basic metabolic panel with GFR   POCT glycosylated hemoglobin (Hb A1C)     Return in about 3 months (around 12/01/2024) for HTN, T2DM, A1c, ACR.   Patient discussed with Dr. Mliss Foot  Fairy Pool, DO [facility] Resident PGY-3 Clinic Phone: [phone] Please contact the on call pager at [phone] for any urgent or emergent needs. "

## 2024-09-02 NOTE — Patient Instructions (Signed)
 Thank you, [name], for allowing us  to provide your care today. Today we discussed . . .  > Blood pressure       - Today we are going to change her blood pressure medicine by adding amlodipine  5 mg daily and I want you to resume taking the olmesartan  40 mg daily instead of adding the extra pill.  Please check your blood pressure twice a day, once in the morning and once in the evening and write it down either in your book or in the blood pressure log.  Our goal is that the top number is below 130 and the bottom number is below 80.  If your blood pressure is not below those numbers most the time over the next 2 weeks please call our clinic and we will increase 1 your medications or add a medication that you have been on in the past called hydrochlorothiazide .  If your blood pressure is mainly below 130 on the top and 80 on the bottom please let us  know as well and we can send in a combination pill of the olmesartan  and amlodipine .  We are going to check your kidney function and electrolytes today and I will call you if there are any abnormalities. > Diabetes       - We are going to increase your Trulicity  to 4.5 mg weekly today.  You have done a very good job controlling your blood sugars and I think increasing this medicine will continue to help that as well as help you lose weight.  Please let us  know if you have any side effects of this medication or have any trouble picking it up from the pharmacy.  We will have you back in 3 months to recheck your A1c. > Anxiety       - Thank you for discussing your increased anxiety with me today and I am very sorry that 2 of your family members are in the hospital right now.  We all hope that they get better soon.  Today we are going to increase your Lexapro  from 10 mg daily to 20 mg daily.  This medication takes a few weeks to start working so if your anxiety symptoms are too much to manage you can call our clinic and we can give you a small amount of  alprazolam  pills to help with the symptoms but as we discussed I think it is best if you are able to come off of this medicine as it can have long-term negative side effects.  I am also going to send a message to Renda Pontes to set up a follow-up appointment to resume therapy.  Please let us  know if you have any issues increasing the Lexapro  or if you need a short course of the alprazolam .   I have ordered the following labs for you:   Lab Orders         Glucose, capillary         Basic metabolic panel with GFR         POCT glycosylated hemoglobin (Hb A1C)      Follow up: 3 months    Remember:  Should you have any questions or concerns please call the internal medicine clinic at [phone].     Melissa Pool, DO [facility]

## 2024-09-03 LAB — BASIC METABOLIC PANEL WITH GFR
BUN/Creatinine Ratio: 17 (ref 12–28)
BUN: 13 mg/dL (ref 8–27)
CO2: 22 mmol/L (ref 20–29)
Calcium: 9.9 mg/dL (ref 8.7–10.3)
Chloride: 103 mmol/L (ref 96–106)
Creatinine, Ser: 0.77 mg/dL (ref 0.57–1.00)
Glucose: 69 mg/dL — ABNORMAL LOW (ref 70–99)
Potassium: 4.2 mmol/L (ref 3.5–5.2)
Sodium: 143 mmol/L (ref 134–144)
eGFR: 81 mL/min/1.73

## 2024-09-03 NOTE — Progress Notes (Signed)
 Internal Medicine Clinic Attending  Case discussed with the resident at the time of the visit.  We reviewed the resident's history and exam and pertinent patient test results.  I agree with the assessment, diagnosis, and plan of care documented in the resident's note.

## 2024-09-05 ENCOUNTER — Ambulatory Visit (HOSPITAL_BASED_OUTPATIENT_CLINIC_OR_DEPARTMENT_OTHER): Admitting: Student

## 2024-09-09 ENCOUNTER — Encounter: Payer: Self-pay | Admitting: Student

## 2024-09-10 ENCOUNTER — Ambulatory Visit (HOSPITAL_BASED_OUTPATIENT_CLINIC_OR_DEPARTMENT_OTHER): Admitting: Student

## 2024-09-10 DIAGNOSIS — M25512 Pain in left shoulder: Secondary | ICD-10-CM

## 2024-09-10 DIAGNOSIS — M75112 Incomplete rotator cuff tear or rupture of left shoulder, not specified as traumatic: Secondary | ICD-10-CM | POA: Diagnosis not present

## 2024-09-10 MED ORDER — BETAMETHASONE SOD PHOS & ACET 6 (3-3) MG/ML IJ SUSP
2.0000 mL | INTRAMUSCULAR | Status: AC | PRN
  Administered 2024-09-10: 2 mL via INTRA_ARTICULAR

## 2024-09-10 MED ORDER — LIDOCAINE HCL 1 % IJ SOLN
4.0000 mL | INTRAMUSCULAR | Status: AC | PRN
  Administered 2024-09-10: 4 mL

## 2024-09-10 NOTE — Progress Notes (Signed)
 "                                Post Operative Evaluation    Procedure/Date of Surgery: Left subacromial balloon spacer 12/12/22  Interval History:   09/10/2024: Patient presents today for follow-up evaluation of her left shoulder.  She was last seen by Dr. Genelle on 05/07/2024 and had discussed a possible subacromial injection at that time.  Patient states that she got busy with work and has not been able to return for this yet.  She is having some pain about the left shoulder particular with movement.  Also states that she has felt a popping sensation in her right shoulder about 2 months ago which has undergone a reverse shoulder arthroplasty, and she is having some discomfort in this as well.   PMH/PSH/Family History/Social History/Meds/Allergies:    Past Medical History:  Diagnosis Date   Allergy    Anxiety    Arthritis    s/p R TKR   Class 2 severe obesity due to excess calories with serious comorbidity and body mass index (BMI) of 35.0 to 35.9 in adult 11/13/2012   COVID-19 05/04/2023   Cyst of left kidney 2015   by lumbar MRI pending renal US    DDD (degenerative disc disease), lumbar 03/2014   severe L2-3 with L HNP with L2/3 nerve root impingement Ge[facility] @ [facility])   Depression with anxiety    Diabetes type 2, controlled (HCC) 2011   borderline   Glaucoma    History of ulcer disease    HLD (hyperlipidemia)    no meds taken   Hypertension    OAB (overactive bladder) 10/14/2019   Osteoarthritis of spine with radiculopathy, 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   Plantar fasciitis of left foot 10/01/2013   PONV (postoperative nausea and vomiting)    Primary osteoarthritis of left knee 07/11/2021   Right-sided face pain 01/07/2019   Rotator cuff tear 02/02/2017   Seasonal allergies    Sleep apnea 2022   no cpap   Trigger middle finger of right hand 11/12/2018   Vitreous degeneration, unspecified eye 10/25/2012   Past Surgical History:  Procedure Laterality Date   ARTERY BIOPSY Right  01/23/2019   Procedure: REMOVAL OF PART OF RIGHT TEMPORAL ARTERY FOR BIOPSY;  Surgeon: Sheldon Standing, MD;  Location: [facility] OR;  Service: General;  Laterality: Right;   CATARACT EXTRACTION W/ INTRAOCULAR LENS IMPLANT Bilateral    COLONOSCOPY  04/2017   TA, diverticulosis  ([facility])   GLAUCOMA SURGERY     HAMMER TOE SURGERY Bilateral    LAPAROSCOPIC SUPRACERVICAL HYSTERECTOMY  2007   with BSO, cervix remains.  Fibroids.   Right hip replacement     SHOULDER ARTHROSCOPY Left 12/12/2022   Procedure: LEFT SHOULDER ARTHROSCOPY WITH BALLOON SPACER;  Surgeon: Genelle Standing, MD;  Location: [facility];  Service: Orthopedics;  Laterality: Left;   TONSILLECTOMY     TOTAL HIP ARTHROPLASTY Left 2021   TOTAL KNEE ARTHROPLASTY Right 2004   TKR (Dr. Deward with [facility])   TOTAL KNEE ARTHROPLASTY Left 07/11/2021   Procedure: LEFT TOTAL KNEE ARTHROPLASTY;  Surgeon: Jerri Kay HERO, MD;  Location: [facility] OR;  Service: Orthopedics;  Laterality: Left;   TOTAL KNEE REVISION Right 03/2014   Dr Raynold [facility]   TOTAL SHOULDER ARTHROPLASTY Right 2022   Social History   Socioeconomic History   Marital status: Single    Spouse name: Not on file  Number of children: 0   Years of education: 16   Highest education level: Not on file  Occupational History   Occupation: work with disabilities/ Retired    Associate Professor: [employer]  Tobacco Use   Smoking status: Never    Passive exposure: Past   Smokeless tobacco: Never  Vaping Use   Vaping status: Never Used  Substance and Sexual Activity   Alcohol use: No   Drug use: No   Sexual activity: Not Currently    Birth control/protection: Surgical  Other Topics Concern   Not on file  Social History Narrative   Caffeine: rare   Lives alone   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[employer]   Fun/Hobbies: Gardening    Social Drivers of Health   Tobacco Use: Low Risk (08/30/2024)   Patient History    Smoking Tobacco Use: Never    Smokeless  Tobacco Use: Never    Passive Exposure: Past  Financial Resource Strain: Medium Risk (01/25/2023)   Overall Financial Resource Strain (CARDIA)    Difficulty of Paying Living Expenses: Somewhat hard  Food Insecurity: No Food Insecurity (08/20/2024)   Epic    Worried About Programme Researcher, Broadcasting/film/video in the Last Year: Never true    Ran Out of Food in the Last Year: Never true  Transportation Needs: No Transportation Needs (08/20/2024)   Epic    Lack of Transportation (Medical): No    Lack of Transportation (Non-Medical): No  Physical Activity: Sufficiently Active (08/20/2024)   Exercise Vital Sign    Days of Exercise per Week: 5 days    Minutes of Exercise per Session: 30 min  Stress: Stress Concern Present (08/20/2024)   [employer] of Occupational Health - Occupational Stress Questionnaire    Feeling of Stress: Rather much  Social Connections: Unknown (08/20/2024)   Social Connection and Isolation Panel    Frequency of Communication with Friends and Family: Once a week    Frequency of Social Gatherings with Friends and Family: Not on file    Attends Religious Services: 1 to 4 times per year    Active Member of Clubs or Organizations: Yes    Attends Banker Meetings: More than 4 times per year    Marital Status: Never married  Depression (PHQ2-9): Medium Risk (09/02/2024)   Depression (PHQ2-9)    PHQ-2 Score: 7  Alcohol Screen: Low Risk (01/25/2023)   Alcohol Screen    Last Alcohol Screening Score (AUDIT): 0  Housing: Low Risk (08/20/2024)   Epic    Unable to Pay for Housing in the Last Year: No    Number of Times Moved in the Last Year: 0    Homeless in the Last Year: No  Utilities: Not At Risk (08/20/2024)   Epic    Threatened with loss of utilities: No  Health Literacy: Adequate Health Literacy (08/20/2024)   B1300 Health Literacy    Frequency of need for help with medical instructions: Never   Family History  Problem Relation Age of Onset   Cancer Mother 21        bone, MM   Diabetes Maternal Grandmother    Hypertension Maternal Grandmother    Ovarian cancer Maternal Grandmother 17   Stroke Other        unsure who   CAD Neg Hx    Colon cancer Neg Hx    Esophageal cancer Neg Hx    Rectal cancer Neg Hx    Stomach cancer Neg Hx  Breast cancer Neg Hx    BRCA 1/2 Neg Hx    Allergies  Allergen Reactions   Codeine Other (See Comments)    palpitations   Metformin  And Related Nausea Only   Oxycodone Nausea And Vomiting   Latex Dermatitis, Itching and Rash   Current Outpatient Medications  Medication Sig Dispense Refill   Accu-Chek FastClix Lancets MISC USE TO TEST BLOOD SUGAR UP TO FOUR TIMES DAILY 306 each 1   ACCU-CHEK GUIDE test strip USE AS DIRECTED FOUR TIMES DAILY 100 strip 11   albuterol  (VENTOLIN  HFA) 108 (90 Base) MCG/ACT inhaler Inhale 1-2 puffs into the lungs every 6 (six) hours as needed for wheezing or shortness of breath. 1 each 0   amLODipine  (NORVASC ) 5 MG tablet Take 1 tablet (5 mg total) by mouth daily. 30 tablet 11   aspirin  EC 81 MG tablet Take 81 mg by mouth daily. Swallow whole.     benzonatate  (TESSALON ) 100 MG capsule Take 1-2 capsules (100-200 mg total) by mouth 3 (three) times daily as needed for cough. 30 capsule 0   blood glucose meter kit and supplies KIT Dispense based on patient and insurance preference. Use up to four times daily as directed. DX Code: E11.9 1 each 0   desonide  (DESOWEN ) 0.05 % ointment Apply 1 Application topically 2 (two) times daily as needed (rash). 60 g 5   Dulaglutide  4.5 MG/0.5ML SOAJ Inject 4.5 mg into the skin once a week. 2 mL 5   escitalopram  (LEXAPRO ) 20 MG tablet Take 1 tablet (20 mg total) by mouth daily. 30 tablet 2   esomeprazole (NEXIUM) 20 MG capsule Take 20 mg by mouth once a week.     hydroquinone  4 % cream Apply topically 2 (two) times daily. 28.35 g 0   olmesartan  (BENICAR ) 40 MG tablet Take 1 tablet (40 mg total) by mouth daily. 30 tablet 11   omeprazole  (PRILOSEC) 20  MG capsule Take 1 capsule (20 mg total) by mouth daily. 30 capsule 0   predniSONE  (DELTASONE ) 20 MG tablet Take 2 tablets (40 mg total) by mouth daily with breakfast. 5 tablet 0   VYZULTA 0.024 % SOLN Place 1 drop into both eyes at bedtime.     No current facility-administered medications for this visit.   No results found.  Review of Systems:   A ROS was performed including pertinent positives and negatives as documented in the HPI.   Musculoskeletal Exam:    There were no vitals taken for this visit.  Left shoulder with well-healed.  Active forward elevation is to 140 degrees with external rotation at the side to 50 degrees.  Internal rotation is to L3.  Strength is much improved  Imaging:     Assessment:   Patient has a history of a left shoulder subacromial balloon spacer back in 2024.  Pain has been slowly returning and she is here to discuss options for pain relief.  She had previously discussed with Dr. Genelle the potential for a left subacromial injection, which after discussion with patient was recommended to perform.  She tolerated the procedure well.  She does report having some discomfort in the right shoulder which underwent a RSA in 2022.  Pain is present in the anterior shoulder therefore I do have some suspicion for biceps tendinitis.  If this continues, patient will follow-up in clinic and would likely want to repeat x-rays at that time to ensure components are well-appearing.  Plan :    - Left shoulder subacromial  injection performed today    Procedure Note  Patient: Melissa [name]             Date of Birth: [birth year]           MRN: [id number]             Visit Date: 09/10/2024  Procedures: Visit Diagnoses:  1. Nontraumatic incomplete tear of left rotator cuff     Large Joint Inj: L subacromial bursa on 09/10/2024 4:33 PM Indications: pain Details: 22 G 1.5 in needle, posterior approach Medications: 4 mL lidocaine  1 %; 2 mL betamethasone   acetate-betamethasone  sodium phosphate  6 (3-3) MG/ML Outcome: tolerated well, no immediate complications Procedure, treatment alternatives, risks and benefits explained, specific risks discussed. Consent was given by the patient. Immediately prior to procedure a time out was called to verify the correct patient, procedure, equipment, support staff and site/side marked as required. Patient was prepped and draped in the usual sterile fashion.     I personally saw and evaluated the patient, and participated in the management and treatment plan.   Leonce Reveal, PA-C Orthopedics  This document was dictated using Conservation officer, historic buildings. A reasonable attempt at proof reading has been made to minimize errors. "

## 2024-09-15 NOTE — Telephone Encounter (Signed)
 Note from patient, holding off on surgery.  Requesting PT.

## 2024-09-16 ENCOUNTER — Encounter: Payer: Self-pay | Admitting: Internal Medicine

## 2024-09-16 DIAGNOSIS — B9689 Other specified bacterial agents as the cause of diseases classified elsewhere: Secondary | ICD-10-CM

## 2024-09-18 MED ORDER — BENZONATATE 100 MG PO CAPS: 100.0000 mg | ORAL_CAPSULE | Freq: Three times a day (TID) | ORAL | 0 refills | Status: AC | PRN

## 2024-09-25 ENCOUNTER — Ambulatory Visit: Admitting: Podiatry

## 2024-09-25 DIAGNOSIS — L600 Ingrowing nail: Secondary | ICD-10-CM

## 2024-09-25 NOTE — Progress Notes (Signed)
 Patient presents complaining of pain on the hallux nail left of the 3rd and 5th toes left as well as the second toe right.  Has done well with the hallux nail we removed on the right and she would like to do the left toe nails also.  His nails are still bothering her with pain with walking and wearing  physical exam:  General appearance: Pleasant, and in no acute distress. AOx3.  Vascular: Pedal pulses: DP 2/4 bilaterally, PT 2/4 bilaterally.  Mild edema lower legs bilaterally. Capillary fill time immediate bilaterally.  Neurological: Grossly intact bilaterally  Dermatologic:   Thickened dystrophic ingrown nails with gryphotic changes 1 3 and 5 the left and second toe right.  Skin normal temperature bilaterally.  Skin normal color, tone, and texture bilaterally.  Major signs of infection  Musculoskeletal: Hammer toes 2 through 5 bilaterally.  Tenderness at the second toe over the PIPJ right    Diagnosis: 1.  Ingrown nails 1, 3, and 5 left and second toenail right  Plan: -Separate office visit for evaluation and management level 3.  Discussed with the ingrown nails and pain she has been having with them.  Discussed with her doing matricectomy's on these toenails.  This would provide permanent removal like we did on the hallux right.  Says she does not want to do it today would like to hold off for a week or 2.  I told her that would be fine for comfort she can soak them for 10 or 15 minutes twice daily until we can do the procedures.   Return 2 weeks bursectomy hallux nail left, then nails 3 and 5 left, then second toe right

## 2024-09-26 ENCOUNTER — Encounter (HOSPITAL_BASED_OUTPATIENT_CLINIC_OR_DEPARTMENT_OTHER): Payer: Self-pay

## 2024-09-26 ENCOUNTER — Ambulatory Visit (HOSPITAL_BASED_OUTPATIENT_CLINIC_OR_DEPARTMENT_OTHER): Admitting: Student

## 2024-10-08 ENCOUNTER — Ambulatory Visit (HOSPITAL_BASED_OUTPATIENT_CLINIC_OR_DEPARTMENT_OTHER): Admitting: Student

## 2024-10-09 ENCOUNTER — Ambulatory Visit: Admitting: Podiatry

## 2024-10-14 ENCOUNTER — Ambulatory Visit: Admitting: Podiatry

## 2024-10-14 ENCOUNTER — Ambulatory Visit (HOSPITAL_BASED_OUTPATIENT_CLINIC_OR_DEPARTMENT_OTHER): Admitting: Student

## 2024-10-15 ENCOUNTER — Ambulatory Visit: Admitting: Podiatry

## 2024-10-22 ENCOUNTER — Ambulatory Visit (HOSPITAL_BASED_OUTPATIENT_CLINIC_OR_DEPARTMENT_OTHER): Admitting: Pulmonary Disease
